# Patient Record
Sex: Female | Born: 1940
Health system: Southern US, Community
[De-identification: ages and names within clinical notes are randomized; demographics above are authoritative.]

## PROBLEM LIST (undated history)

## (undated) DIAGNOSIS — I10 Essential (primary) hypertension: Secondary | ICD-10-CM

## (undated) DIAGNOSIS — K219 Gastro-esophageal reflux disease without esophagitis: Secondary | ICD-10-CM

## (undated) DIAGNOSIS — I499 Cardiac arrhythmia, unspecified: Secondary | ICD-10-CM

## (undated) DIAGNOSIS — N3281 Overactive bladder: Secondary | ICD-10-CM

## (undated) DIAGNOSIS — R011 Cardiac murmur, unspecified: Secondary | ICD-10-CM

## (undated) DIAGNOSIS — I351 Nonrheumatic aortic (valve) insufficiency: Secondary | ICD-10-CM

## (undated) DIAGNOSIS — M1990 Unspecified osteoarthritis, unspecified site: Secondary | ICD-10-CM

## (undated) DIAGNOSIS — E785 Hyperlipidemia, unspecified: Secondary | ICD-10-CM

## (undated) DIAGNOSIS — F329 Major depressive disorder, single episode, unspecified: Secondary | ICD-10-CM

## (undated) DIAGNOSIS — G4730 Sleep apnea, unspecified: Secondary | ICD-10-CM

## (undated) DIAGNOSIS — A498 Other bacterial infections of unspecified site: Secondary | ICD-10-CM

## (undated) DIAGNOSIS — T7840XA Allergy, unspecified, initial encounter: Secondary | ICD-10-CM

## (undated) DIAGNOSIS — F32A Depression, unspecified: Secondary | ICD-10-CM

## (undated) DIAGNOSIS — N3946 Mixed incontinence: Secondary | ICD-10-CM

## (undated) DIAGNOSIS — H269 Unspecified cataract: Secondary | ICD-10-CM

## (undated) DIAGNOSIS — R112 Nausea with vomiting, unspecified: Secondary | ICD-10-CM

## (undated) DIAGNOSIS — C801 Malignant (primary) neoplasm, unspecified: Secondary | ICD-10-CM

## (undated) DIAGNOSIS — E039 Hypothyroidism, unspecified: Secondary | ICD-10-CM

## (undated) DIAGNOSIS — J302 Other seasonal allergic rhinitis: Secondary | ICD-10-CM

## (undated) DIAGNOSIS — M8580 Other specified disorders of bone density and structure, unspecified site: Secondary | ICD-10-CM

## (undated) DIAGNOSIS — E079 Disorder of thyroid, unspecified: Secondary | ICD-10-CM

## (undated) HISTORY — DX: Unspecified cataract: H26.9

## (undated) HISTORY — DX: Other seasonal allergic rhinitis: J30.2

## (undated) HISTORY — DX: Disorder of thyroid, unspecified: E07.9

## (undated) HISTORY — DX: Hyperlipidemia, unspecified: E78.5

## (undated) HISTORY — DX: Overactive bladder: N32.81

## (undated) HISTORY — DX: Gastro-esophageal reflux disease without esophagitis: K21.9

## (undated) HISTORY — DX: Malignant (primary) neoplasm, unspecified: C80.1

## (undated) HISTORY — DX: Other specified disorders of bone density and structure, unspecified site: M85.80

## (undated) HISTORY — DX: Depression, unspecified: F32.A

## (undated) HISTORY — DX: Mixed incontinence: N39.46

## (undated) HISTORY — DX: Major depressive disorder, single episode, unspecified: F32.9

## (undated) HISTORY — PX: VAGINAL HYSTERECTOMY: SUR661

## (undated) HISTORY — DX: Unspecified osteoarthritis, unspecified site: M19.90

## (undated) HISTORY — PX: CHOLECYSTECTOMY: SHX55

## (undated) HISTORY — DX: Other bacterial infections of unspecified site: A49.8

## (undated) HISTORY — DX: Cardiac arrhythmia, unspecified: I49.9

## (undated) HISTORY — DX: Essential (primary) hypertension: I10

## (undated) HISTORY — PX: TRIGGER FINGER RELEASE: SHX641

## (undated) HISTORY — DX: Nonrheumatic aortic (valve) insufficiency: I35.1

## (undated) HISTORY — PX: EYE SURGERY: SHX253

## (undated) HISTORY — PX: OTHER SURGICAL HISTORY: SHX169

## (undated) HISTORY — DX: Sleep apnea, unspecified: G47.30

## (undated) HISTORY — DX: Allergy, unspecified, initial encounter: T78.40XA

## (undated) HISTORY — DX: Cardiac murmur, unspecified: R01.1

## (undated) HISTORY — PX: COSMETIC SURGERY: SHX468

---

## 1969-04-24 HISTORY — PX: APPENDECTOMY: SHX54

## 2010-10-22 DIAGNOSIS — M5137 Other intervertebral disc degeneration, lumbosacral region: Secondary | ICD-10-CM | POA: Insufficient documentation

## 2010-10-22 DIAGNOSIS — I10 Essential (primary) hypertension: Secondary | ICD-10-CM | POA: Insufficient documentation

## 2012-05-30 ENCOUNTER — Other Ambulatory Visit: Payer: Self-pay | Admitting: Family Medicine

## 2012-05-30 DIAGNOSIS — E2839 Other primary ovarian failure: Secondary | ICD-10-CM

## 2012-06-06 ENCOUNTER — Other Ambulatory Visit: Payer: Self-pay

## 2012-06-20 ENCOUNTER — Ambulatory Visit
Admission: RE | Admit: 2012-06-20 | Discharge: 2012-06-20 | Disposition: A | Discharge status: Home / Self Care | Payer: Medicare Other | Source: Ambulatory Visit | Attending: Family Medicine | Admitting: Family Medicine

## 2012-06-20 DIAGNOSIS — E2839 Other primary ovarian failure: Secondary | ICD-10-CM

## 2012-08-05 DIAGNOSIS — M8580 Other specified disorders of bone density and structure, unspecified site: Secondary | ICD-10-CM | POA: Insufficient documentation

## 2012-08-05 HISTORY — DX: Other specified disorders of bone density and structure, unspecified site: M85.80

## 2012-08-13 DIAGNOSIS — M189 Osteoarthritis of first carpometacarpal joint, unspecified: Secondary | ICD-10-CM | POA: Insufficient documentation

## 2012-08-13 DIAGNOSIS — N3946 Mixed incontinence: Secondary | ICD-10-CM | POA: Insufficient documentation

## 2012-08-13 DIAGNOSIS — J302 Other seasonal allergic rhinitis: Secondary | ICD-10-CM | POA: Insufficient documentation

## 2012-12-13 ENCOUNTER — Other Ambulatory Visit: Payer: Self-pay | Admitting: Family Medicine

## 2012-12-13 DIAGNOSIS — Z1231 Encounter for screening mammogram for malignant neoplasm of breast: Secondary | ICD-10-CM

## 2012-12-13 DIAGNOSIS — K219 Gastro-esophageal reflux disease without esophagitis: Secondary | ICD-10-CM | POA: Insufficient documentation

## 2012-12-30 ENCOUNTER — Ambulatory Visit: Payer: Medicare Other

## 2013-01-02 ENCOUNTER — Ambulatory Visit
Admission: RE | Admit: 2013-01-02 | Discharge: 2013-01-02 | Disposition: A | Discharge status: Home / Self Care | Payer: Medicare Other | Source: Ambulatory Visit | Attending: Family Medicine | Admitting: Family Medicine

## 2013-01-02 DIAGNOSIS — Z1231 Encounter for screening mammogram for malignant neoplasm of breast: Secondary | ICD-10-CM

## 2013-12-05 ENCOUNTER — Other Ambulatory Visit: Payer: Self-pay

## 2013-12-05 DIAGNOSIS — Z1231 Encounter for screening mammogram for malignant neoplasm of breast: Secondary | ICD-10-CM

## 2013-12-16 DIAGNOSIS — N3281 Overactive bladder: Secondary | ICD-10-CM

## 2013-12-16 HISTORY — DX: Overactive bladder: N32.81

## 2014-01-06 ENCOUNTER — Ambulatory Visit
Admission: RE | Admit: 2014-01-06 | Discharge: 2014-01-06 | Disposition: A | Discharge status: Home / Self Care | Payer: Medicare Other | Source: Ambulatory Visit

## 2014-01-06 ENCOUNTER — Ambulatory Visit: Payer: Medicare Other

## 2014-01-06 DIAGNOSIS — Z1231 Encounter for screening mammogram for malignant neoplasm of breast: Secondary | ICD-10-CM

## 2014-12-15 ENCOUNTER — Other Ambulatory Visit: Payer: Self-pay

## 2014-12-15 DIAGNOSIS — Z1231 Encounter for screening mammogram for malignant neoplasm of breast: Secondary | ICD-10-CM

## 2014-12-25 ENCOUNTER — Other Ambulatory Visit: Payer: Self-pay | Admitting: Family Medicine

## 2014-12-25 DIAGNOSIS — M8580 Other specified disorders of bone density and structure, unspecified site: Secondary | ICD-10-CM

## 2015-01-22 ENCOUNTER — Ambulatory Visit: Payer: Medicare Other

## 2015-02-08 ENCOUNTER — Ambulatory Visit
Admission: RE | Admit: 2015-02-08 | Discharge: 2015-02-08 | Disposition: A | Discharge status: Home / Self Care | Payer: Medicare Other | Source: Ambulatory Visit

## 2015-02-08 DIAGNOSIS — Z1231 Encounter for screening mammogram for malignant neoplasm of breast: Secondary | ICD-10-CM

## 2015-02-12 ENCOUNTER — Ambulatory Visit
Admission: RE | Admit: 2015-02-12 | Discharge: 2015-02-12 | Disposition: A | Discharge status: Home / Self Care | Payer: Medicare Other | Source: Ambulatory Visit | Attending: Family Medicine | Admitting: Family Medicine

## 2015-02-12 DIAGNOSIS — M8580 Other specified disorders of bone density and structure, unspecified site: Secondary | ICD-10-CM

## 2015-03-22 DIAGNOSIS — Z8639 Personal history of other endocrine, nutritional and metabolic disease: Secondary | ICD-10-CM | POA: Insufficient documentation

## 2015-09-23 DIAGNOSIS — H269 Unspecified cataract: Secondary | ICD-10-CM | POA: Insufficient documentation

## 2015-09-23 HISTORY — DX: Unspecified cataract: H26.9

## 2015-12-20 DIAGNOSIS — E890 Postprocedural hypothyroidism: Secondary | ICD-10-CM | POA: Insufficient documentation

## 2015-12-21 ENCOUNTER — Other Ambulatory Visit: Payer: Self-pay | Admitting: Family Medicine

## 2015-12-21 DIAGNOSIS — Z1231 Encounter for screening mammogram for malignant neoplasm of breast: Secondary | ICD-10-CM

## 2016-02-06 DIAGNOSIS — H903 Sensorineural hearing loss, bilateral: Secondary | ICD-10-CM | POA: Insufficient documentation

## 2016-02-09 ENCOUNTER — Ambulatory Visit: Payer: Medicare Other

## 2016-02-23 ENCOUNTER — Ambulatory Visit: Payer: Medicare Other

## 2016-03-21 ENCOUNTER — Ambulatory Visit
Admission: RE | Admit: 2016-03-21 | Discharge: 2016-03-21 | Disposition: A | Discharge status: Home / Self Care | Payer: Medicare Other | Source: Ambulatory Visit | Attending: Family Medicine | Admitting: Family Medicine

## 2016-03-21 DIAGNOSIS — Z1231 Encounter for screening mammogram for malignant neoplasm of breast: Secondary | ICD-10-CM

## 2016-05-02 DIAGNOSIS — M9903 Segmental and somatic dysfunction of lumbar region: Secondary | ICD-10-CM | POA: Diagnosis not present

## 2016-05-02 DIAGNOSIS — M5032 Other cervical disc degeneration, mid-cervical region, unspecified level: Secondary | ICD-10-CM | POA: Diagnosis not present

## 2016-05-02 DIAGNOSIS — M9901 Segmental and somatic dysfunction of cervical region: Secondary | ICD-10-CM | POA: Diagnosis not present

## 2016-05-02 DIAGNOSIS — M9902 Segmental and somatic dysfunction of thoracic region: Secondary | ICD-10-CM | POA: Diagnosis not present

## 2016-05-02 DIAGNOSIS — M65849 Other synovitis and tenosynovitis, unspecified hand: Secondary | ICD-10-CM | POA: Diagnosis not present

## 2016-05-02 DIAGNOSIS — M5136 Other intervertebral disc degeneration, lumbar region: Secondary | ICD-10-CM | POA: Diagnosis not present

## 2016-05-02 DIAGNOSIS — S29012A Strain of muscle and tendon of back wall of thorax, initial encounter: Secondary | ICD-10-CM | POA: Diagnosis not present

## 2016-05-03 DIAGNOSIS — E890 Postprocedural hypothyroidism: Secondary | ICD-10-CM | POA: Diagnosis not present

## 2016-05-08 DIAGNOSIS — M79642 Pain in left hand: Secondary | ICD-10-CM | POA: Diagnosis not present

## 2016-05-08 DIAGNOSIS — M65332 Trigger finger, left middle finger: Secondary | ICD-10-CM | POA: Diagnosis not present

## 2016-05-19 DIAGNOSIS — R52 Pain, unspecified: Secondary | ICD-10-CM | POA: Diagnosis not present

## 2016-05-19 DIAGNOSIS — M94262 Chondromalacia, left knee: Secondary | ICD-10-CM | POA: Diagnosis not present

## 2016-05-19 DIAGNOSIS — I10 Essential (primary) hypertension: Secondary | ICD-10-CM | POA: Diagnosis not present

## 2016-05-30 DIAGNOSIS — S29012A Strain of muscle and tendon of back wall of thorax, initial encounter: Secondary | ICD-10-CM | POA: Diagnosis not present

## 2016-05-30 DIAGNOSIS — M9901 Segmental and somatic dysfunction of cervical region: Secondary | ICD-10-CM | POA: Diagnosis not present

## 2016-05-30 DIAGNOSIS — M9903 Segmental and somatic dysfunction of lumbar region: Secondary | ICD-10-CM | POA: Diagnosis not present

## 2016-05-30 DIAGNOSIS — M5032 Other cervical disc degeneration, mid-cervical region, unspecified level: Secondary | ICD-10-CM | POA: Diagnosis not present

## 2016-05-30 DIAGNOSIS — M5136 Other intervertebral disc degeneration, lumbar region: Secondary | ICD-10-CM | POA: Diagnosis not present

## 2016-05-30 DIAGNOSIS — M9902 Segmental and somatic dysfunction of thoracic region: Secondary | ICD-10-CM | POA: Diagnosis not present

## 2016-05-30 DIAGNOSIS — M65849 Other synovitis and tenosynovitis, unspecified hand: Secondary | ICD-10-CM | POA: Diagnosis not present

## 2016-06-28 DIAGNOSIS — M9901 Segmental and somatic dysfunction of cervical region: Secondary | ICD-10-CM | POA: Diagnosis not present

## 2016-06-28 DIAGNOSIS — M9903 Segmental and somatic dysfunction of lumbar region: Secondary | ICD-10-CM | POA: Diagnosis not present

## 2016-06-28 DIAGNOSIS — M9902 Segmental and somatic dysfunction of thoracic region: Secondary | ICD-10-CM | POA: Diagnosis not present

## 2016-06-28 DIAGNOSIS — M65849 Other synovitis and tenosynovitis, unspecified hand: Secondary | ICD-10-CM | POA: Diagnosis not present

## 2016-06-28 DIAGNOSIS — M5136 Other intervertebral disc degeneration, lumbar region: Secondary | ICD-10-CM | POA: Diagnosis not present

## 2016-06-28 DIAGNOSIS — S29012A Strain of muscle and tendon of back wall of thorax, initial encounter: Secondary | ICD-10-CM | POA: Diagnosis not present

## 2016-06-28 DIAGNOSIS — M5032 Other cervical disc degeneration, mid-cervical region, unspecified level: Secondary | ICD-10-CM | POA: Diagnosis not present

## 2016-08-02 DIAGNOSIS — M5136 Other intervertebral disc degeneration, lumbar region: Secondary | ICD-10-CM | POA: Diagnosis not present

## 2016-08-02 DIAGNOSIS — M9901 Segmental and somatic dysfunction of cervical region: Secondary | ICD-10-CM | POA: Diagnosis not present

## 2016-08-02 DIAGNOSIS — M5032 Other cervical disc degeneration, mid-cervical region, unspecified level: Secondary | ICD-10-CM | POA: Diagnosis not present

## 2016-08-02 DIAGNOSIS — M9903 Segmental and somatic dysfunction of lumbar region: Secondary | ICD-10-CM | POA: Diagnosis not present

## 2016-08-02 DIAGNOSIS — M65849 Other synovitis and tenosynovitis, unspecified hand: Secondary | ICD-10-CM | POA: Diagnosis not present

## 2016-08-02 DIAGNOSIS — M9902 Segmental and somatic dysfunction of thoracic region: Secondary | ICD-10-CM | POA: Diagnosis not present

## 2016-08-02 DIAGNOSIS — S29012A Strain of muscle and tendon of back wall of thorax, initial encounter: Secondary | ICD-10-CM | POA: Diagnosis not present

## 2016-08-15 DIAGNOSIS — E890 Postprocedural hypothyroidism: Secondary | ICD-10-CM | POA: Diagnosis not present

## 2016-08-15 DIAGNOSIS — I10 Essential (primary) hypertension: Secondary | ICD-10-CM | POA: Diagnosis not present

## 2016-08-31 DIAGNOSIS — M9902 Segmental and somatic dysfunction of thoracic region: Secondary | ICD-10-CM | POA: Diagnosis not present

## 2016-08-31 DIAGNOSIS — M9901 Segmental and somatic dysfunction of cervical region: Secondary | ICD-10-CM | POA: Diagnosis not present

## 2016-08-31 DIAGNOSIS — M5136 Other intervertebral disc degeneration, lumbar region: Secondary | ICD-10-CM | POA: Diagnosis not present

## 2016-08-31 DIAGNOSIS — M65849 Other synovitis and tenosynovitis, unspecified hand: Secondary | ICD-10-CM | POA: Diagnosis not present

## 2016-08-31 DIAGNOSIS — S29012A Strain of muscle and tendon of back wall of thorax, initial encounter: Secondary | ICD-10-CM | POA: Diagnosis not present

## 2016-08-31 DIAGNOSIS — M9903 Segmental and somatic dysfunction of lumbar region: Secondary | ICD-10-CM | POA: Diagnosis not present

## 2016-08-31 DIAGNOSIS — M5032 Other cervical disc degeneration, mid-cervical region, unspecified level: Secondary | ICD-10-CM | POA: Diagnosis not present

## 2016-10-03 DIAGNOSIS — S29012A Strain of muscle and tendon of back wall of thorax, initial encounter: Secondary | ICD-10-CM | POA: Diagnosis not present

## 2016-10-03 DIAGNOSIS — M5136 Other intervertebral disc degeneration, lumbar region: Secondary | ICD-10-CM | POA: Diagnosis not present

## 2016-10-03 DIAGNOSIS — M9902 Segmental and somatic dysfunction of thoracic region: Secondary | ICD-10-CM | POA: Diagnosis not present

## 2016-10-03 DIAGNOSIS — M9903 Segmental and somatic dysfunction of lumbar region: Secondary | ICD-10-CM | POA: Diagnosis not present

## 2016-10-03 DIAGNOSIS — M9901 Segmental and somatic dysfunction of cervical region: Secondary | ICD-10-CM | POA: Diagnosis not present

## 2016-10-03 DIAGNOSIS — M5032 Other cervical disc degeneration, mid-cervical region, unspecified level: Secondary | ICD-10-CM | POA: Diagnosis not present

## 2016-10-03 DIAGNOSIS — M65849 Other synovitis and tenosynovitis, unspecified hand: Secondary | ICD-10-CM | POA: Diagnosis not present

## 2016-10-09 DIAGNOSIS — K219 Gastro-esophageal reflux disease without esophagitis: Secondary | ICD-10-CM | POA: Diagnosis not present

## 2016-10-09 DIAGNOSIS — R1010 Upper abdominal pain, unspecified: Secondary | ICD-10-CM | POA: Diagnosis not present

## 2016-10-09 DIAGNOSIS — R0789 Other chest pain: Secondary | ICD-10-CM | POA: Diagnosis not present

## 2016-10-09 DIAGNOSIS — R05 Cough: Secondary | ICD-10-CM | POA: Diagnosis not present

## 2016-10-09 DIAGNOSIS — K5900 Constipation, unspecified: Secondary | ICD-10-CM | POA: Diagnosis not present

## 2016-10-09 DIAGNOSIS — R072 Precordial pain: Secondary | ICD-10-CM | POA: Diagnosis not present

## 2016-10-09 DIAGNOSIS — I10 Essential (primary) hypertension: Secondary | ICD-10-CM | POA: Diagnosis not present

## 2016-10-18 DIAGNOSIS — K219 Gastro-esophageal reflux disease without esophagitis: Secondary | ICD-10-CM | POA: Diagnosis not present

## 2016-10-18 DIAGNOSIS — F458 Other somatoform disorders: Secondary | ICD-10-CM | POA: Diagnosis not present

## 2016-10-18 DIAGNOSIS — I10 Essential (primary) hypertension: Secondary | ICD-10-CM | POA: Diagnosis not present

## 2016-10-24 DIAGNOSIS — Z0000 Encounter for general adult medical examination without abnormal findings: Secondary | ICD-10-CM | POA: Diagnosis not present

## 2016-10-24 DIAGNOSIS — R197 Diarrhea, unspecified: Secondary | ICD-10-CM | POA: Diagnosis not present

## 2016-11-07 DIAGNOSIS — M5032 Other cervical disc degeneration, mid-cervical region, unspecified level: Secondary | ICD-10-CM | POA: Diagnosis not present

## 2016-11-07 DIAGNOSIS — M9903 Segmental and somatic dysfunction of lumbar region: Secondary | ICD-10-CM | POA: Diagnosis not present

## 2016-11-07 DIAGNOSIS — S29012A Strain of muscle and tendon of back wall of thorax, initial encounter: Secondary | ICD-10-CM | POA: Diagnosis not present

## 2016-11-07 DIAGNOSIS — M5136 Other intervertebral disc degeneration, lumbar region: Secondary | ICD-10-CM | POA: Diagnosis not present

## 2016-11-07 DIAGNOSIS — M9902 Segmental and somatic dysfunction of thoracic region: Secondary | ICD-10-CM | POA: Diagnosis not present

## 2016-11-07 DIAGNOSIS — M65849 Other synovitis and tenosynovitis, unspecified hand: Secondary | ICD-10-CM | POA: Diagnosis not present

## 2016-11-07 DIAGNOSIS — M9901 Segmental and somatic dysfunction of cervical region: Secondary | ICD-10-CM | POA: Diagnosis not present

## 2016-11-12 ENCOUNTER — Telehealth: Payer: Self-pay | Admitting: Gastroenterology

## 2016-11-12 DIAGNOSIS — A0472 Enterocolitis due to Clostridium difficile, not specified as recurrent: Secondary | ICD-10-CM

## 2016-11-12 MED ORDER — VANCOMYCIN HCL 125 MG PO CAPS: 125.0000 mg | ORAL_CAPSULE | Freq: Four times a day (QID) | ORAL | 0 refills | Status: AC

## 2016-11-12 NOTE — Telephone Encounter (Signed)
Patient called - she states she has recently been treated for C Diff in early July. She completed a course of flagyl which she finished a week ago. She has had recurrence of loose watery diarrhea and abdominal pain, concerning for C Diff recurrence. Recommend a 10 day course of oral vancomycin. I will order this for her. I also recommended she take Florastor probiotic during this course to help prevent recurrence.  If it fails to improve her symptoms or if she has recurrence she should call back. All questions answered. She agreed with the plan.

## 2016-11-29 DIAGNOSIS — F458 Other somatoform disorders: Secondary | ICD-10-CM | POA: Diagnosis not present

## 2016-11-29 DIAGNOSIS — A0472 Enterocolitis due to Clostridium difficile, not specified as recurrent: Secondary | ICD-10-CM | POA: Diagnosis not present

## 2016-12-05 DIAGNOSIS — M5136 Other intervertebral disc degeneration, lumbar region: Secondary | ICD-10-CM | POA: Diagnosis not present

## 2016-12-05 DIAGNOSIS — M9901 Segmental and somatic dysfunction of cervical region: Secondary | ICD-10-CM | POA: Diagnosis not present

## 2016-12-05 DIAGNOSIS — M65849 Other synovitis and tenosynovitis, unspecified hand: Secondary | ICD-10-CM | POA: Diagnosis not present

## 2016-12-05 DIAGNOSIS — M9902 Segmental and somatic dysfunction of thoracic region: Secondary | ICD-10-CM | POA: Diagnosis not present

## 2016-12-05 DIAGNOSIS — S29012A Strain of muscle and tendon of back wall of thorax, initial encounter: Secondary | ICD-10-CM | POA: Diagnosis not present

## 2016-12-05 DIAGNOSIS — M5032 Other cervical disc degeneration, mid-cervical region, unspecified level: Secondary | ICD-10-CM | POA: Diagnosis not present

## 2016-12-05 DIAGNOSIS — M9903 Segmental and somatic dysfunction of lumbar region: Secondary | ICD-10-CM | POA: Diagnosis not present

## 2016-12-07 ENCOUNTER — Encounter: Payer: Self-pay | Admitting: Family Medicine

## 2016-12-07 DIAGNOSIS — R12 Heartburn: Secondary | ICD-10-CM | POA: Diagnosis not present

## 2016-12-07 DIAGNOSIS — K219 Gastro-esophageal reflux disease without esophagitis: Secondary | ICD-10-CM | POA: Diagnosis not present

## 2016-12-07 DIAGNOSIS — F458 Other somatoform disorders: Secondary | ICD-10-CM | POA: Diagnosis not present

## 2017-01-02 DIAGNOSIS — S29012A Strain of muscle and tendon of back wall of thorax, initial encounter: Secondary | ICD-10-CM | POA: Diagnosis not present

## 2017-01-02 DIAGNOSIS — M9903 Segmental and somatic dysfunction of lumbar region: Secondary | ICD-10-CM | POA: Diagnosis not present

## 2017-01-02 DIAGNOSIS — M5136 Other intervertebral disc degeneration, lumbar region: Secondary | ICD-10-CM | POA: Diagnosis not present

## 2017-01-02 DIAGNOSIS — M5032 Other cervical disc degeneration, mid-cervical region, unspecified level: Secondary | ICD-10-CM | POA: Diagnosis not present

## 2017-01-02 DIAGNOSIS — M65849 Other synovitis and tenosynovitis, unspecified hand: Secondary | ICD-10-CM | POA: Diagnosis not present

## 2017-01-02 DIAGNOSIS — M9902 Segmental and somatic dysfunction of thoracic region: Secondary | ICD-10-CM | POA: Diagnosis not present

## 2017-01-02 DIAGNOSIS — M9901 Segmental and somatic dysfunction of cervical region: Secondary | ICD-10-CM | POA: Diagnosis not present

## 2017-01-10 DIAGNOSIS — K219 Gastro-esophageal reflux disease without esophagitis: Secondary | ICD-10-CM | POA: Diagnosis not present

## 2017-01-10 DIAGNOSIS — F458 Other somatoform disorders: Secondary | ICD-10-CM | POA: Diagnosis not present

## 2017-02-01 DIAGNOSIS — M9903 Segmental and somatic dysfunction of lumbar region: Secondary | ICD-10-CM | POA: Diagnosis not present

## 2017-02-01 DIAGNOSIS — M65849 Other synovitis and tenosynovitis, unspecified hand: Secondary | ICD-10-CM | POA: Diagnosis not present

## 2017-02-01 DIAGNOSIS — M9902 Segmental and somatic dysfunction of thoracic region: Secondary | ICD-10-CM | POA: Diagnosis not present

## 2017-02-01 DIAGNOSIS — S29012A Strain of muscle and tendon of back wall of thorax, initial encounter: Secondary | ICD-10-CM | POA: Diagnosis not present

## 2017-02-01 DIAGNOSIS — M5032 Other cervical disc degeneration, mid-cervical region, unspecified level: Secondary | ICD-10-CM | POA: Diagnosis not present

## 2017-02-01 DIAGNOSIS — M5136 Other intervertebral disc degeneration, lumbar region: Secondary | ICD-10-CM | POA: Diagnosis not present

## 2017-02-01 DIAGNOSIS — M9901 Segmental and somatic dysfunction of cervical region: Secondary | ICD-10-CM | POA: Diagnosis not present

## 2017-02-02 DIAGNOSIS — Z23 Encounter for immunization: Secondary | ICD-10-CM | POA: Diagnosis not present

## 2017-02-14 DIAGNOSIS — I10 Essential (primary) hypertension: Secondary | ICD-10-CM | POA: Diagnosis not present

## 2017-02-14 DIAGNOSIS — E890 Postprocedural hypothyroidism: Secondary | ICD-10-CM | POA: Diagnosis not present

## 2017-02-19 DIAGNOSIS — H35363 Drusen (degenerative) of macula, bilateral: Secondary | ICD-10-CM | POA: Diagnosis not present

## 2017-02-19 DIAGNOSIS — H04123 Dry eye syndrome of bilateral lacrimal glands: Secondary | ICD-10-CM | POA: Diagnosis not present

## 2017-02-28 DIAGNOSIS — M9902 Segmental and somatic dysfunction of thoracic region: Secondary | ICD-10-CM | POA: Diagnosis not present

## 2017-02-28 DIAGNOSIS — M9903 Segmental and somatic dysfunction of lumbar region: Secondary | ICD-10-CM | POA: Diagnosis not present

## 2017-02-28 DIAGNOSIS — M5136 Other intervertebral disc degeneration, lumbar region: Secondary | ICD-10-CM | POA: Diagnosis not present

## 2017-02-28 DIAGNOSIS — M5032 Other cervical disc degeneration, mid-cervical region, unspecified level: Secondary | ICD-10-CM | POA: Diagnosis not present

## 2017-02-28 DIAGNOSIS — M65849 Other synovitis and tenosynovitis, unspecified hand: Secondary | ICD-10-CM | POA: Diagnosis not present

## 2017-02-28 DIAGNOSIS — S29012A Strain of muscle and tendon of back wall of thorax, initial encounter: Secondary | ICD-10-CM | POA: Diagnosis not present

## 2017-02-28 DIAGNOSIS — M9901 Segmental and somatic dysfunction of cervical region: Secondary | ICD-10-CM | POA: Diagnosis not present

## 2017-03-22 ENCOUNTER — Other Ambulatory Visit: Payer: Self-pay | Admitting: Family Medicine

## 2017-03-22 DIAGNOSIS — Z1231 Encounter for screening mammogram for malignant neoplasm of breast: Secondary | ICD-10-CM

## 2017-03-23 ENCOUNTER — Ambulatory Visit
Admission: RE | Admit: 2017-03-23 | Discharge: 2017-03-23 | Disposition: A | Discharge status: Home / Self Care | Payer: Medicare Other | Source: Ambulatory Visit | Attending: Family Medicine | Admitting: Family Medicine

## 2017-03-23 DIAGNOSIS — Z1231 Encounter for screening mammogram for malignant neoplasm of breast: Secondary | ICD-10-CM | POA: Diagnosis not present

## 2017-03-23 IMAGING — MG 2D DIGITAL SCREENING BILATERAL MAMMOGRAM WITH CAD AND ADJUNCT TO
8 of 12 series · 8 of 28 positions shown · non-contrast
Comparison: Previous exam(s).

CLINICAL DATA: Screening.

EXAM:
2D DIGITAL SCREENING BILATERAL MAMMOGRAM WITH CAD AND ADJUNCT TOMO

[L CC synth-2D]
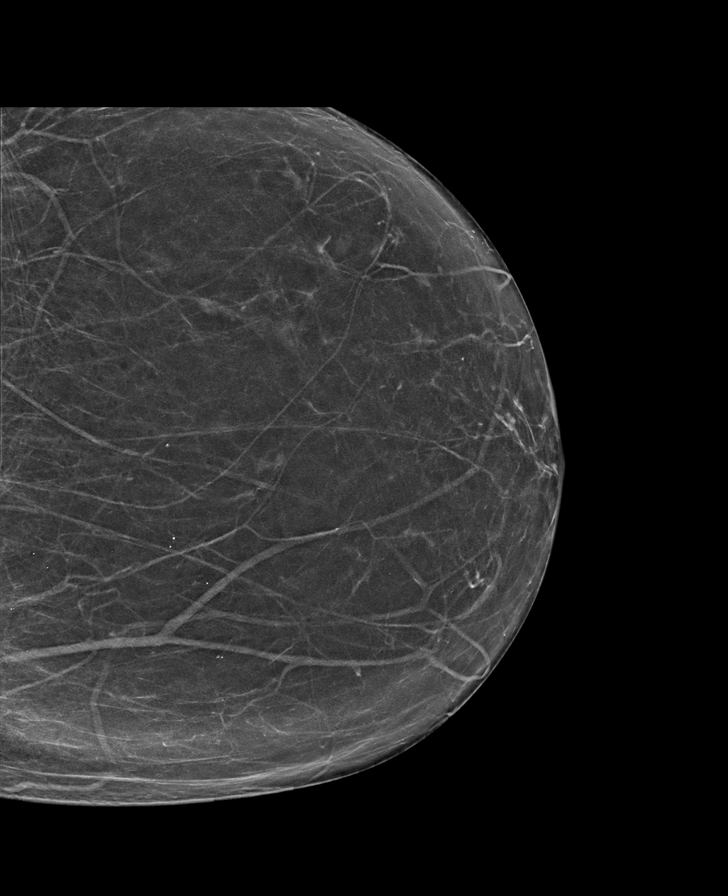

[R CC synth-2D]
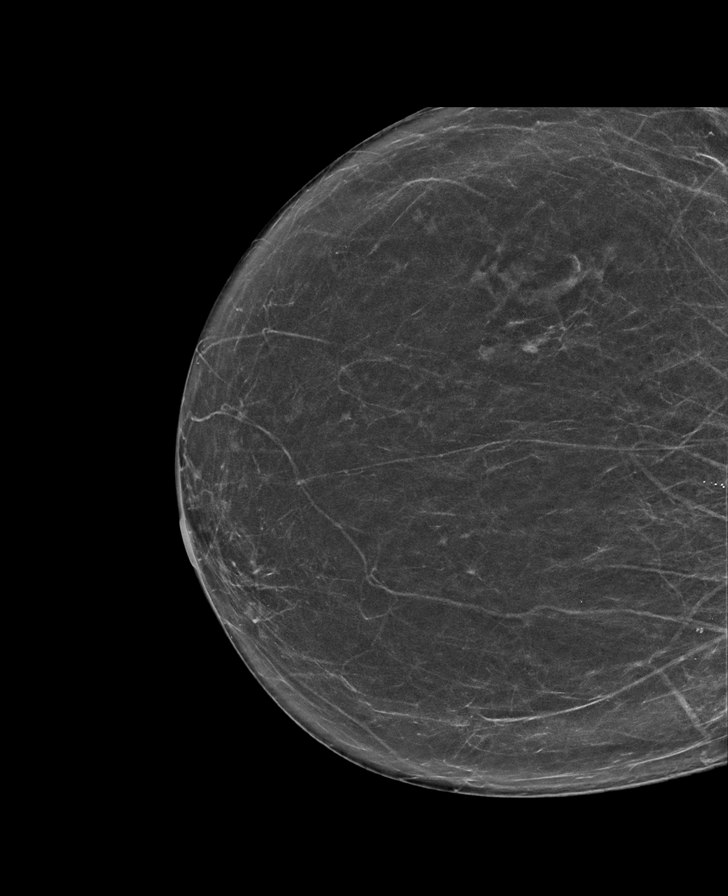

[R CC]
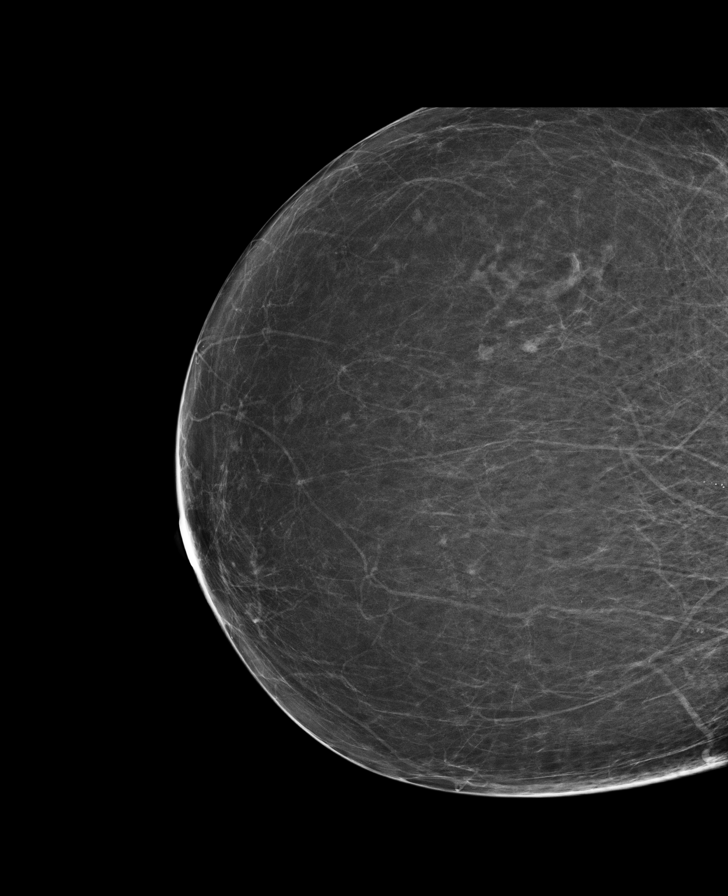

[R MLO]
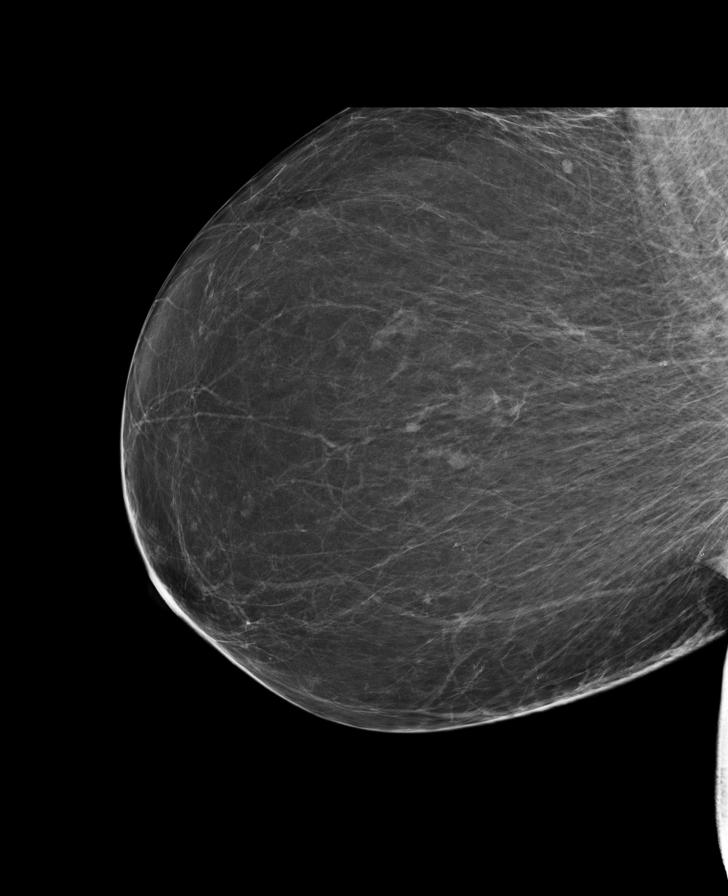

[L MLO synth-2D]
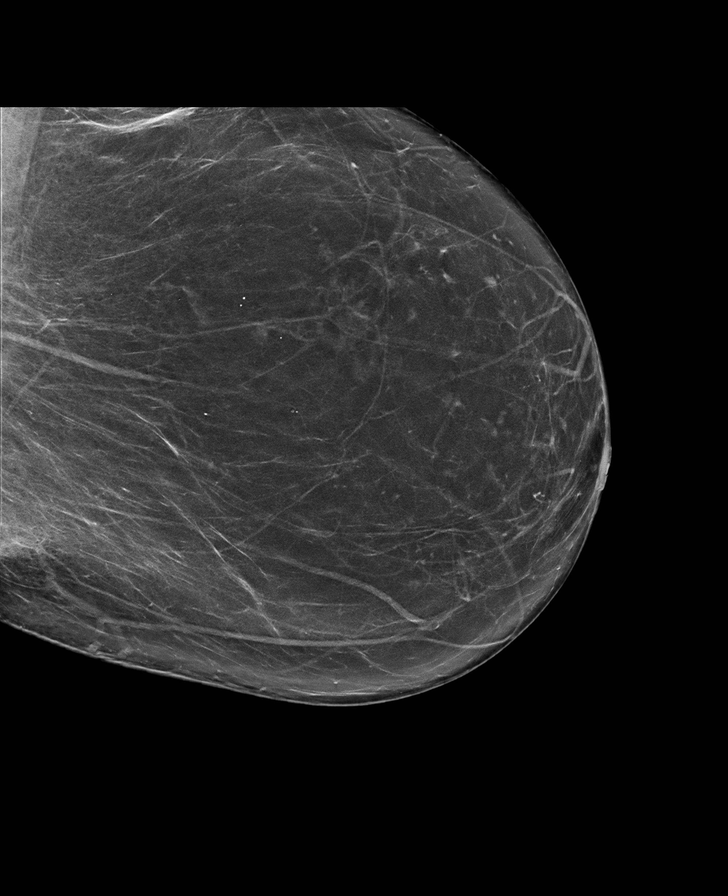

[L CC]
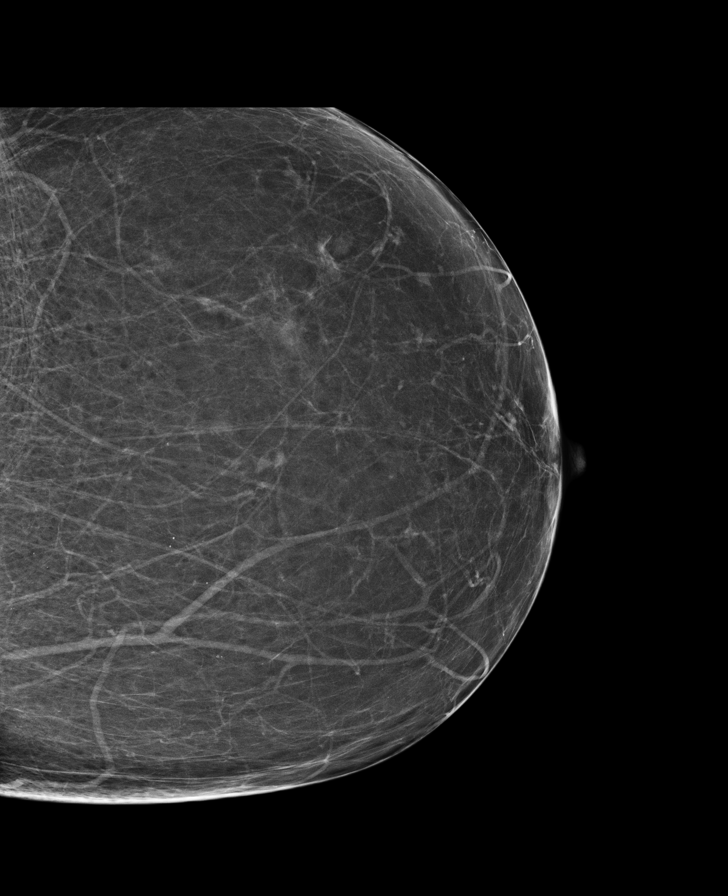

[L MLO]
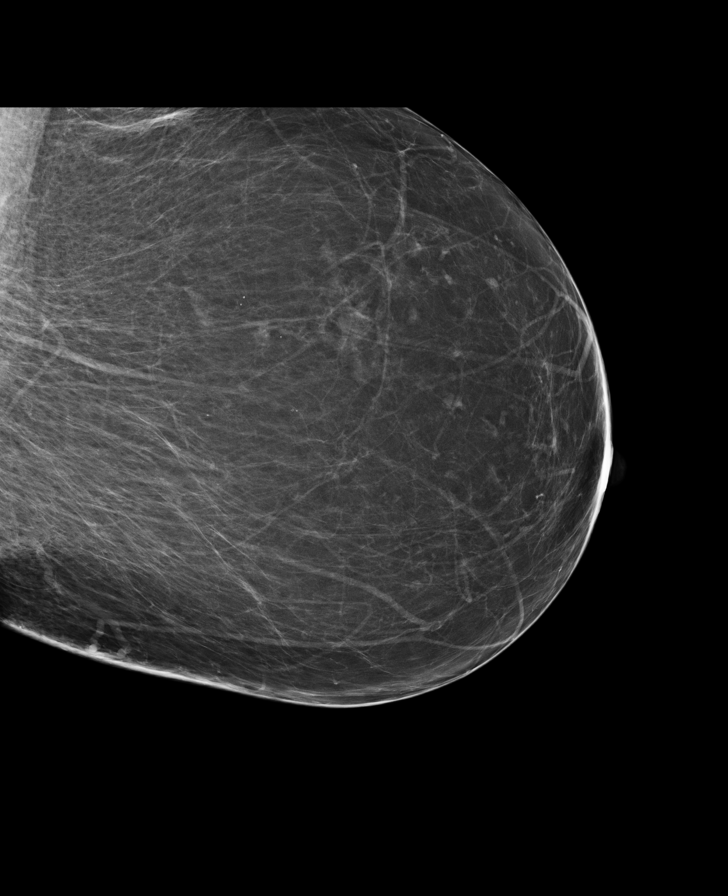

[R MLO synth-2D]
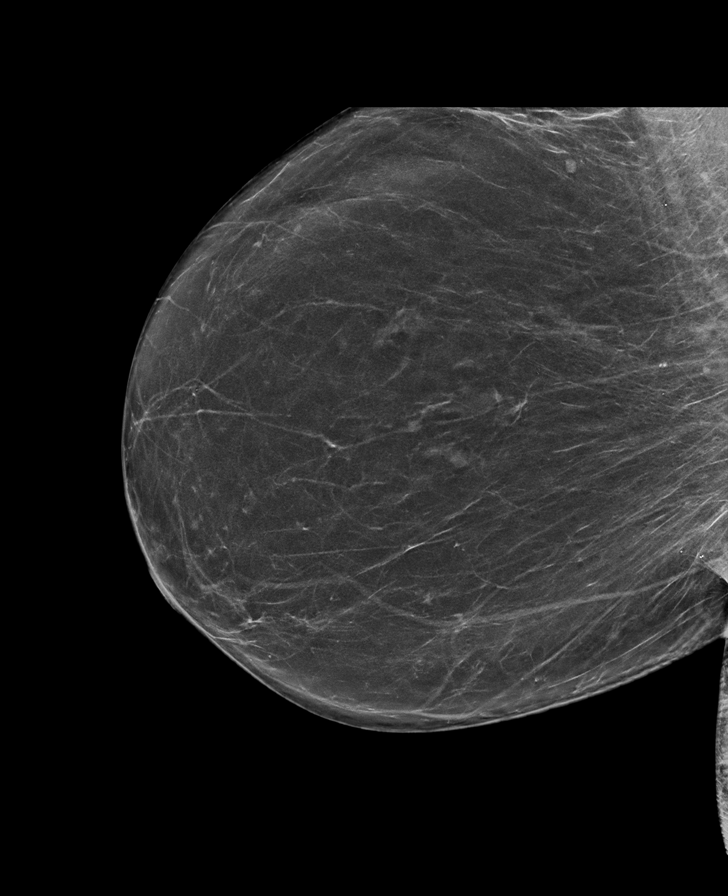

[8 of 28 positions shown; findings below may reference images not displayed]

ACR Breast Density Category b: There are scattered areas of
fibroglandular density.
FINDINGS: There are no findings suspicious for malignancy. Images were
processed with CAD.
IMPRESSION: No mammographic evidence of malignancy. A result letter of this
screening mammogram will be mailed directly to the patient.

RECOMMENDATION:
Screening mammogram in one year. (Code:[33])

BI-RADS CATEGORY  1: Negative.

## 2017-03-29 DIAGNOSIS — M5032 Other cervical disc degeneration, mid-cervical region, unspecified level: Secondary | ICD-10-CM | POA: Diagnosis not present

## 2017-03-29 DIAGNOSIS — M9901 Segmental and somatic dysfunction of cervical region: Secondary | ICD-10-CM | POA: Diagnosis not present

## 2017-03-29 DIAGNOSIS — M9903 Segmental and somatic dysfunction of lumbar region: Secondary | ICD-10-CM | POA: Diagnosis not present

## 2017-03-29 DIAGNOSIS — M5136 Other intervertebral disc degeneration, lumbar region: Secondary | ICD-10-CM | POA: Diagnosis not present

## 2017-03-29 DIAGNOSIS — M65849 Other synovitis and tenosynovitis, unspecified hand: Secondary | ICD-10-CM | POA: Diagnosis not present

## 2017-03-29 DIAGNOSIS — M9902 Segmental and somatic dysfunction of thoracic region: Secondary | ICD-10-CM | POA: Diagnosis not present

## 2017-03-29 DIAGNOSIS — S29012A Strain of muscle and tendon of back wall of thorax, initial encounter: Secondary | ICD-10-CM | POA: Diagnosis not present

## 2017-04-03 ENCOUNTER — Ambulatory Visit: Payer: Medicare Other | Admitting: Family Medicine

## 2017-04-06 ENCOUNTER — Ambulatory Visit: Payer: Medicare Other | Admitting: Family Medicine

## 2017-04-06 DIAGNOSIS — H26493 Other secondary cataract, bilateral: Secondary | ICD-10-CM | POA: Diagnosis not present

## 2017-04-06 DIAGNOSIS — Z961 Presence of intraocular lens: Secondary | ICD-10-CM | POA: Diagnosis not present

## 2017-04-06 DIAGNOSIS — H26492 Other secondary cataract, left eye: Secondary | ICD-10-CM | POA: Diagnosis not present

## 2017-04-10 ENCOUNTER — Other Ambulatory Visit: Payer: Self-pay | Admitting: *Deleted

## 2017-04-10 ENCOUNTER — Encounter: Payer: Self-pay | Admitting: *Deleted

## 2017-04-16 ENCOUNTER — Ambulatory Visit (INDEPENDENT_AMBULATORY_CARE_PROVIDER_SITE_OTHER): Payer: Medicare Other | Admitting: Family Medicine

## 2017-04-16 ENCOUNTER — Ambulatory Visit: Payer: Medicare Other | Admitting: Family Medicine

## 2017-04-16 ENCOUNTER — Encounter: Payer: Self-pay | Admitting: Family Medicine

## 2017-04-16 VITALS — BP 140/80 | HR 59 | Temp 97.8°F | Ht 61.0 in | Wt 174.4 lb

## 2017-04-16 DIAGNOSIS — K219 Gastro-esophageal reflux disease without esophagitis: Secondary | ICD-10-CM | POA: Diagnosis not present

## 2017-04-16 DIAGNOSIS — R9431 Abnormal electrocardiogram [ECG] [EKG]: Secondary | ICD-10-CM

## 2017-04-16 DIAGNOSIS — I10 Essential (primary) hypertension: Secondary | ICD-10-CM | POA: Diagnosis not present

## 2017-04-16 DIAGNOSIS — R5383 Other fatigue: Secondary | ICD-10-CM

## 2017-04-16 DIAGNOSIS — E782 Mixed hyperlipidemia: Secondary | ICD-10-CM

## 2017-04-16 DIAGNOSIS — F329 Major depressive disorder, single episode, unspecified: Secondary | ICD-10-CM | POA: Insufficient documentation

## 2017-04-16 DIAGNOSIS — E890 Postprocedural hypothyroidism: Secondary | ICD-10-CM | POA: Diagnosis not present

## 2017-04-16 DIAGNOSIS — A498 Other bacterial infections of unspecified site: Secondary | ICD-10-CM

## 2017-04-16 DIAGNOSIS — F341 Dysthymic disorder: Secondary | ICD-10-CM | POA: Diagnosis not present

## 2017-04-16 DIAGNOSIS — F339 Major depressive disorder, recurrent, unspecified: Secondary | ICD-10-CM | POA: Insufficient documentation

## 2017-04-16 DIAGNOSIS — Z8249 Family history of ischemic heart disease and other diseases of the circulatory system: Secondary | ICD-10-CM | POA: Insufficient documentation

## 2017-04-16 HISTORY — DX: Other bacterial infections of unspecified site: A49.8

## 2017-04-16 LAB — COMPREHENSIVE METABOLIC PANEL
ALBUMIN: 4.2 g/dL (ref 3.5–5.2)
ALK PHOS: 93 U/L (ref 39–117)
ALT: 10 U/L (ref 0–35)
AST: 15 U/L (ref 0–37)
BUN: 22 mg/dL (ref 6–23)
CALCIUM: 9.5 mg/dL (ref 8.4–10.5)
CO2: 30 mEq/L (ref 19–32)
Chloride: 102 mEq/L (ref 96–112)
Creatinine, Ser: 0.96 mg/dL (ref 0.40–1.20)
GFR: 60.09 mL/min (ref >60.00)
Glucose, Bld: 79 mg/dL (ref 70–99)
POTASSIUM: 4.6 meq/L (ref 3.5–5.1)
Sodium: 139 mEq/L (ref 135–145)
TOTAL PROTEIN: 6.9 g/dL (ref 6.0–8.3)
Total Bilirubin: 0.6 mg/dL (ref 0.2–1.2)

## 2017-04-16 LAB — LIPID PANEL
CHOLESTEROL: 201 mg/dL — AB (ref 0–200)
HDL: 70.5 mg/dL (ref >39.00)
LDL Cholesterol: 116 mg/dL — ABNORMAL HIGH (ref 0–99)
NonHDL: 130.83
Total CHOL/HDL Ratio: 3
Triglycerides: 75 mg/dL (ref 0.0–149.0)
VLDL: 15 mg/dL (ref 0.0–40.0)

## 2017-04-16 LAB — POCT URINALYSIS DIPSTICK
Bilirubin, UA: NEGATIVE
GLUCOSE UA: NEGATIVE
Ketones, UA: NEGATIVE
LEUKOCYTES UA: NEGATIVE
Nitrite, UA: NEGATIVE
PROTEIN UA: NEGATIVE
RBC UA: NEGATIVE
Spec Grav, UA: 1.02 (ref 1.010–1.025)
Urobilinogen, UA: 0.2 E.U./dL
pH, UA: 6 (ref 5.0–8.0)

## 2017-04-16 LAB — CBC WITH DIFFERENTIAL/PLATELET
Basophils Absolute: 0.1 10*3/uL (ref 0.0–0.1)
Basophils Relative: 1.1 % (ref 0.0–3.0)
EOS PCT: 2.4 % (ref 0.0–5.0)
Eosinophils Absolute: 0.2 10*3/uL (ref 0.0–0.7)
HEMATOCRIT: 41.2 % (ref 36.0–46.0)
HEMOGLOBIN: 13.4 g/dL (ref 12.0–15.0)
LYMPHS ABS: 1.7 10*3/uL (ref 0.7–4.0)
Lymphocytes Relative: 25 % (ref 12.0–46.0)
MCHC: 32.5 g/dL (ref 30.0–36.0)
MCV: 88.5 fl (ref 78.0–100.0)
MONOS PCT: 7.8 % (ref 3.0–12.0)
Monocytes Absolute: 0.5 10*3/uL (ref 0.1–1.0)
Neutro Abs: 4.2 10*3/uL (ref 1.4–7.7)
Neutrophils Relative %: 63.7 % (ref 43.0–77.0)
Platelets: 259 10*3/uL (ref 150.0–400.0)
RBC: 4.66 Mil/uL (ref 3.87–5.11)
RDW: 14.1 % (ref 11.5–15.5)
WBC: 6.7 10*3/uL (ref 4.0–10.5)

## 2017-04-16 MED ORDER — SOLIFENACIN SUCCINATE 10 MG PO TABS: 10.0000 mg | ORAL_TABLET | Freq: Every day | ORAL | 3 refills | Status: DC

## 2017-04-16 NOTE — Progress Notes (Signed)
Subjective  Chief Complaint  Patient presents with  . Establish Care    Transfer from [place]  . Annual Exam    Not fasting    HPI: Martha Gilmore is a 76 y.o. female who presents to [facility] today for a Female Wellness Visit. She also has the concerns and/or needs as listed above in the chief complaint. These will be addressed in addition to the Health Maintenance Visit/medicare. She is a former [facility] patient and is here to reestablish care with me today.   Wellness Visit: annual visit with health maintenance review and exam without Pap   Up to date on screens. Due for labs. Weight is stable.  Lifestyle: Body mass index is 32.95 kg/m. Wt Readings from Last 3 Encounters:  04/16/17 174 lb 6.1 oz (79.1 kg)   Diet: general Exercise: never, none  Chronic disease management visit and/or acute problem visit:  We reviewed her multiple medical problems. Currently with active mood problems mainly related to caregiver stress - husband has ESRD on dialysis and is becoming very frail. On celexa for years. Believes it helps but not sure.    Fatigue and abnl EKG: She admits her fatigue has improved since June ( I reviewed that note) ; she was eventually diagnosed with GERD and C.diff and treated. Doing much better but feels tired with activity w/o chest pain; she is sedentary. Reviewed EKG which showed new q waves in V1-V3 w/o other changes. She has a strong FH of premature CAD.  She doesn't check her BP; today it is borderline with mild brady. No lightheadedness  Hypothyroidism f/u: recent visit by Dr. Steffanie Dunn. Improved. Stable on meds.   GERD - doing ok on omeprazole. dexilant worked better but was too expensive. Had EGD by Dr. Benson Norway. I am requesting those records.   OAB - vesicare not holding her urge sxs. Has stress incont as well. Never has seen urology. No dysuria or irritative sxs. Has to wear a pad.   Lipids: due for recheck on statin. W/o AEs.    Patient Active Problem List   Diagnosis Date Noted  . Postablative hypothyroidism 12/20/2015    Priority: High  . Major depression, chronic 04/16/2017    Priority: Medium  . Mixed hyperlipidemia 04/16/2017    Priority: Medium  . History of Graves' disease 03/22/2015    Priority: Medium  . GERD (gastroesophageal reflux disease) 12/13/2012    Priority: Medium  . Osteopenia 08/05/2012    Priority: Medium  . DDD (degenerative disc disease), lumbosacral 10/22/2010    Priority: Medium  . Bilateral sensorineural hearing loss 02/06/2016    Priority: Low  . OAB (overactive bladder) 12/16/2013    Priority: Low  . Osteoarthritis of [place] joint of thumb 08/13/2012    Priority: Low  . Seasonal allergies 08/13/2012    Priority: Low  . Urge and stress incontinence 08/13/2012    Priority: Low  . Clostridium difficile infection 04/16/2017  . Family history of early CAD 04/16/2017   Health Maintenance  Topic Date Due  . DEXA SCAN  02/11/2018  . MAMMOGRAM  03/23/2018  . TETANUS/TDAP  08/16/2020  . COLONOSCOPY  04/04/2025  . INFLUENZA VACCINE  Completed  . PNA vac Low Risk Adult  Completed   Immunization History  Administered Date(s) Administered  . Influenza Split 01/15/2012  . Influenza, High Dose Seasonal PF 01/30/2013, 01/05/2015, 02/17/2016, 02/02/2017  . Influenza, Seasonal, Injecte, Preservative Fre 01/21/2014  . Influenza-Unspecified 01/09/2011, 01/15/2012, 01/21/2014  . Pneumococcal Conjugate-13  01/05/2015  . Pneumococcal Polysaccharide-23 04/24/2006, 11/18/2007, 05/30/2012  . Tdap 08/17/2010  . Zoster 04/25/1995   We updated and reviewed the patient's past history in detail and it is documented below. Allergies: Patient is allergic to aspirin; meperidine; penicillins; statins; cephalexin; clindamycin/lincomycin; other; and tape. Past Medical History Patient  has a past medical history of Cataracts, bilateral (09/23/2015), Clostridium difficile infection (04/16/2017),  Depression, Hyperlipidemia, Hypertension, OAB (overactive bladder) (12/16/2013), Osteopenia (08/05/2012), Seasonal allergies, and Urge and stress incontinence. Past Surgical History Patient  has a past surgical history that includes Cholecystectomy; Cesarean section; Teeth Implants; Appendectomy (1971); Trigger finger release; and Vaginal hysterectomy. Family History: Patient family history includes Brain cancer in her brother; Cancer in her father; Diabetes in her sister; Heart disease in her father, maternal grandmother, mother, and sister; Kidney cancer in her sister; Lung cancer in her sister. Social History:  Patient  reports that  has never smoked. she has never used smokeless tobacco. She reports that she does not drink alcohol or use drugs.  Review of Systems: Constitutional: negative for fever or malaise Ophthalmic: negative for photophobia, double vision or loss of vision Cardiovascular: negative for chest pain, dyspnea on exertion, or new LE swelling Respiratory: negative for SOB or persistent cough Gastrointestinal: negative for abdominal pain, change in bowel habits or melena Genitourinary: negative for dysuria or gross hematuria, no abnormal uterine bleeding or disharge Musculoskeletal: negative for new gait disturbance or muscular weakness Integumentary: negative for new or persistent rashes, no breast lumps Neurological: negative for TIA or stroke symptoms Psychiatric: negative for SI or delusions Allergic/Immunologic: negative for hives  Patient Care Team    Relationship Specialty Notifications Start End  Leamon Arnt, MD PCP - General Family Medicine  03/21/16   Susette Racer, MD Consulting Physician Endocrinology  04/16/17   Carol Ada, MD Consulting Physician Gastroenterology  04/16/17     Objective  Vitals: BP 140/80 (BP Location: Left Arm, Patient Position: Sitting, Cuff Size: Large)   Pulse (!) 59   Temp 97.8 F (36.6 C) (Oral)   Ht 5\' 1"  (1.549 m)   Wt  174 lb 6.1 oz (79.1 kg)   SpO2 97%   BMI 32.95 kg/m  General:  Well developed, well nourished, no acute distress  Psych:  Alert and orientedx3,normal mood and affect HEENT:  Normocephalic, atraumatic, non-icteric sclera, PERRL, oropharynx is clear without mass or exudate, supple neck without adenopathy, mass or thyromegaly Cardiovascular:  Normal S1, S2, RRR without gallop, rub or murmur, nondisplaced PMI Respiratory:  Good breath sounds bilaterally, CTAB with normal respiratory effort Gastrointestinal: normal bowel sounds, soft, non-tender, no noted masses. No HSM MSK: no deformities, contusions. Joints are without erythema or swelling. Spine and CVA region are nontender Skin:  Warm, no rashes or suspicious lesions noted Neurologic:    Mental status is normal. CN 2-11 are normal. Gross motor and sensory exams are normal. Normal gait. No tremor Breast Exam: large pendulous, No mass, skin retraction or nipple discharge is appreciated in either breast. No axillary adenopathy. Fibrocystic changes are not noted   Assessment  1. Fatigue, unspecified type   2. Major depression, chronic   3. Mixed hyperlipidemia   4. Postablative hypothyroidism   5. Gastroesophageal reflux disease without esophagitis   6. Abnormal EKG   7. Family history of early CAD      Plan  Female Wellness Visit/medicare:  Age appropriate Health Maintenance and Prevention measures were discussed with patient. Included topics are cancer screening recommendations, ways to keep healthy (see  AVS) including dietary and exercise recommendations, regular eye and dental care, use of seat belts, and avoidance of moderate alcohol use and tobacco use. All up to date  BMI: discussed patient's BMI and encouraged positive lifestyle modifications to help get to or maintain a target BMI.  HM needs and immunizations were addressed and ordered. See below for orders. See HM and immunization section for updates. Up to date  Routine labs  and screening tests ordered including cmp, cbc and lipids where appropriate.  Discussed recommendations regarding Vit D and calcium supplementation (see AVS)  Chronic disease f/u and/or acute problem visit: (deemed necessary to be done in addition to the wellness visit):  abnl EKG with fatigue: rec treadmill stress test for further evaluation. Referral placed. Educated patient.   Lipids: recheck labs and continue meds.   GERD - continue PPI and get GI records.   C.diff - resolved. Get records.   Depression - refer to therapist. rcommend counseling.   OAB - increase vesicare dosing to see if helps. Consider urology eval if doesn't.   Follow up: Return in about 3 months (around 07/15/2017) for and AWV at patients convenience, follow up on. or as needed for any worsening symptoms or changes in condition.   Commons side effects, risks, benefits, and alternatives for medications and treatment plan prescribed today were discussed, and the patient expressed understanding of the given instructions. Patient is instructed to call or message via MyChart if he/she has any questions or concerns regarding our treatment plan. No barriers to understanding were identified. We discussed Red Flag symptoms and signs in detail. Patient expressed understanding regarding what to do in case of urgent or emergency type symptoms.   Medication list was reconciled, printed and provided to the patient in AVS. Patient instructions and summary information was reviewed with the patient as documented in the AVS. This note was prepared with assistance of Dragon voice recognition software. Occasional wrong-word or sound-a-like substitutions may have occurred due to the inherent limitations of voice recognition software  Orders Placed This Encounter  Procedures  . CBC with Differential/Platelet  . Comprehensive metabolic panel  . Lipid panel  . Exercise Tolerance Test  . POCT urinalysis dipstick   Meds ordered this  encounter  Medications  . solifenacin (VESICARE) 10 MG tablet    Sig: Take 1 tablet (10 mg total) by mouth daily.    Dispense:  90 tablet    Refill:  3

## 2017-04-16 NOTE — Patient Instructions (Addendum)
It was so good seeing you again! Thank you for establishing with my new practice and allowing me to continue caring for you. It means a lot to me.   Please schedule a follow up appointment with me in 3 months for follow up on blood pressure and mood. Please sign a release of records for Dr. Carol Ada.  Please contact a therapist to help deal with some of your stressors.   We will call you with information regarding your referral appointment. Stress test, treadmill to assess your heart.   Medicare recommends an Annual Wellness Visit for all patients. Please schedule this to be done with our Nurse Educator, Maudie Mercury. This is an informative "talk" visit; it's goals are to ensure that your health care needs are being met and to give you education regarding avoiding falls, ensuring you are not suffering from depression or problems with memory or thinking, and to educate you on Advance Care Planning. It helps me take good care of you!

## 2017-04-18 ENCOUNTER — Encounter: Payer: Self-pay | Admitting: Family Medicine

## 2017-04-18 NOTE — Progress Notes (Signed)
Lab results mailed to patient in letter. Normal results. No action / follow up needed on these results.

## 2017-04-18 NOTE — Progress Notes (Signed)
I have reviewed results. Normal. Patient notified by letter. Please see letter for details. 

## 2017-04-26 ENCOUNTER — Ambulatory Visit (INDEPENDENT_AMBULATORY_CARE_PROVIDER_SITE_OTHER): Payer: Medicare Other

## 2017-04-26 DIAGNOSIS — R5383 Other fatigue: Secondary | ICD-10-CM

## 2017-04-26 DIAGNOSIS — R9431 Abnormal electrocardiogram [ECG] [EKG]: Secondary | ICD-10-CM

## 2017-04-26 DIAGNOSIS — M9903 Segmental and somatic dysfunction of lumbar region: Secondary | ICD-10-CM | POA: Diagnosis not present

## 2017-04-26 DIAGNOSIS — M5136 Other intervertebral disc degeneration, lumbar region: Secondary | ICD-10-CM | POA: Diagnosis not present

## 2017-04-26 DIAGNOSIS — Z8249 Family history of ischemic heart disease and other diseases of the circulatory system: Secondary | ICD-10-CM

## 2017-04-26 DIAGNOSIS — M5032 Other cervical disc degeneration, mid-cervical region, unspecified level: Secondary | ICD-10-CM | POA: Diagnosis not present

## 2017-04-26 DIAGNOSIS — M9901 Segmental and somatic dysfunction of cervical region: Secondary | ICD-10-CM | POA: Diagnosis not present

## 2017-04-26 DIAGNOSIS — M9902 Segmental and somatic dysfunction of thoracic region: Secondary | ICD-10-CM | POA: Diagnosis not present

## 2017-04-26 DIAGNOSIS — M65849 Other synovitis and tenosynovitis, unspecified hand: Secondary | ICD-10-CM | POA: Diagnosis not present

## 2017-04-26 DIAGNOSIS — S29012A Strain of muscle and tendon of back wall of thorax, initial encounter: Secondary | ICD-10-CM | POA: Diagnosis not present

## 2017-04-26 LAB — EXERCISE TOLERANCE TEST
CSEPED: 1 min
CSEPEDS: 1 s
CSEPEW: 2.8 METS
CSEPHR: 77 %
MPHR: 145 {beats}/min
Peak HR: 113 {beats}/min
RPE: 14
Rest HR: 60 {beats}/min

## 2017-04-27 DIAGNOSIS — D225 Melanocytic nevi of trunk: Secondary | ICD-10-CM | POA: Diagnosis not present

## 2017-04-27 DIAGNOSIS — D2272 Melanocytic nevi of left lower limb, including hip: Secondary | ICD-10-CM | POA: Diagnosis not present

## 2017-04-27 DIAGNOSIS — L814 Other melanin hyperpigmentation: Secondary | ICD-10-CM | POA: Diagnosis not present

## 2017-04-27 DIAGNOSIS — L821 Other seborrheic keratosis: Secondary | ICD-10-CM | POA: Diagnosis not present

## 2017-04-27 DIAGNOSIS — D2271 Melanocytic nevi of right lower limb, including hip: Secondary | ICD-10-CM | POA: Diagnosis not present

## 2017-04-27 DIAGNOSIS — Z85828 Personal history of other malignant neoplasm of skin: Secondary | ICD-10-CM | POA: Diagnosis not present

## 2017-04-27 DIAGNOSIS — D2262 Melanocytic nevi of left upper limb, including shoulder: Secondary | ICD-10-CM | POA: Diagnosis not present

## 2017-04-27 DIAGNOSIS — D2261 Melanocytic nevi of right upper limb, including shoulder: Secondary | ICD-10-CM | POA: Diagnosis not present

## 2017-04-27 DIAGNOSIS — L820 Inflamed seborrheic keratosis: Secondary | ICD-10-CM | POA: Diagnosis not present

## 2017-04-27 DIAGNOSIS — D1801 Hemangioma of skin and subcutaneous tissue: Secondary | ICD-10-CM | POA: Diagnosis not present

## 2017-05-03 ENCOUNTER — Other Ambulatory Visit: Payer: Self-pay | Admitting: *Deleted

## 2017-05-03 ENCOUNTER — Encounter: Payer: Self-pay | Admitting: Family Medicine

## 2017-05-03 MED ORDER — SOLIFENACIN SUCCINATE 10 MG PO TABS: 10.0000 mg | ORAL_TABLET | Freq: Every day | ORAL | 3 refills | Status: DC

## 2017-05-03 MED ORDER — ATORVASTATIN CALCIUM 10 MG PO TABS: 10.0000 mg | ORAL_TABLET | Freq: Every day | ORAL | 3 refills | Status: DC

## 2017-05-03 MED ORDER — OMEPRAZOLE 20 MG PO CPDR: 20.0000 mg | DELAYED_RELEASE_CAPSULE | Freq: Every day | ORAL | 3 refills | Status: DC

## 2017-05-08 ENCOUNTER — Telehealth: Payer: Self-pay | Admitting: Family Medicine

## 2017-05-08 MED ORDER — CITALOPRAM HYDROBROMIDE 40 MG PO TABS: 20.0000 mg | ORAL_TABLET | Freq: Every day | ORAL | Status: DC

## 2017-05-08 NOTE — Telephone Encounter (Signed)
Please call patient and ask her to decrease her Celexa to 20g daily - this is needed due to an unwanted interaction with omeprazole.   Please order new tabs (20mg ) if patient is unable to cut her pills in half.   And ask how her husband is doing: should be released from hospital soon?  thanks

## 2017-05-08 NOTE — Telephone Encounter (Signed)
Left message on voicemail to call office.  

## 2017-05-09 ENCOUNTER — Telehealth: Payer: Self-pay | Admitting: Family Medicine

## 2017-05-09 NOTE — Telephone Encounter (Signed)
Copied from [place] [id number]. Topic: Quick Communication - See Telephone Encounter >> May 09, 2017 10:16 AM Burnis Medin, NT wrote: CRM for notification. See Telephone encounter for: Altha Harm is calling to see if the doctor still wanted patient to take the citalopram (CELEXA) 40 MG tablet and omeprazole (PRILOSEC) 20 MG capsule because of FDA guidelines and qt prolongation. She would like a call back at [phone] Reference number is [id number]  05/09/17.

## 2017-05-09 NOTE — Telephone Encounter (Signed)
Patient called in for request of Omeprazole and Celexa, see pharmacy note.

## 2017-05-09 NOTE — Telephone Encounter (Signed)
Left message on voicemail to call office.  

## 2017-05-09 NOTE — Telephone Encounter (Signed)
Copied from [place] [id number]. Topic: Quick Communication - Office Called Patient >> May 09, 2017  3:41 PM Patrice Paradise wrote: Reason for CRM: Patient returned the office call. She stated that the doctor still have her on these two meds citalopram (CELEXA) 40 MG tablet and omeprazole (PRILOSEC) 20 MG capsule. And, would like for the prescriptions to go to   [facility] [address] [place] [address] Phone: [phone] Fax: [phone]

## 2017-05-10 MED ORDER — OMEPRAZOLE 20 MG PO CPDR: 20.0000 mg | DELAYED_RELEASE_CAPSULE | Freq: Every day | ORAL | 3 refills | Status: DC

## 2017-05-10 MED ORDER — CITALOPRAM HYDROBROMIDE 20 MG PO TABS: 20.0000 mg | ORAL_TABLET | Freq: Every day | ORAL | 3 refills | Status: DC

## 2017-05-10 NOTE — Telephone Encounter (Signed)
Spoke to pt, told her per Dr. [name] need to decrease Celexa to 20 mg daily due to unwanted interaction with omeprazole. Pt verbalized understanding. Told pt I will send new Rx to pharmacy for new dose. Pt verbalized understanding.

## 2017-05-10 NOTE — Telephone Encounter (Signed)
Spoke to pt, will send Rx's to [facility] as requested. Pt verbalized understanding.Rx for Celexa decreased to 20 mg daily. Pt aware see other message. Rx's sent.

## 2017-05-13 NOTE — Progress Notes (Signed)
Reviewed report/results; normal/stable.

## 2017-06-15 ENCOUNTER — Encounter: Payer: Self-pay | Admitting: Family Medicine

## 2017-06-15 MED ORDER — SOLIFENACIN SUCCINATE 10 MG PO TABS: 5.0000 mg | ORAL_TABLET | Freq: Every day | ORAL | 3 refills | Status: DC

## 2017-06-15 NOTE — Telephone Encounter (Signed)
Spoke to patient; offered condolences regarding the passing of her husband on January 31st.  She is doing well.  Can cut vesicare tabs in half for 5mg  daily.

## 2017-06-15 NOTE — Telephone Encounter (Signed)
Dr. [name],   I spoke with Patient this morning and she stated that she was previously on 5mg  of Vesicare and the Pharmacy sent her 10mg , per our records we prescribed 10mg  on 04/16/2017, but she states that she still had some 5mg , left and it seems to be working well. The patient wants to know should she start the 10mg  or stay on the 5mg . Please Advise.

## 2017-06-19 DIAGNOSIS — M5136 Other intervertebral disc degeneration, lumbar region: Secondary | ICD-10-CM | POA: Diagnosis not present

## 2017-06-19 DIAGNOSIS — M5032 Other cervical disc degeneration, mid-cervical region, unspecified level: Secondary | ICD-10-CM | POA: Diagnosis not present

## 2017-06-19 DIAGNOSIS — M65849 Other synovitis and tenosynovitis, unspecified hand: Secondary | ICD-10-CM | POA: Diagnosis not present

## 2017-06-19 DIAGNOSIS — M9903 Segmental and somatic dysfunction of lumbar region: Secondary | ICD-10-CM | POA: Diagnosis not present

## 2017-06-19 DIAGNOSIS — M9901 Segmental and somatic dysfunction of cervical region: Secondary | ICD-10-CM | POA: Diagnosis not present

## 2017-06-19 DIAGNOSIS — S29012A Strain of muscle and tendon of back wall of thorax, initial encounter: Secondary | ICD-10-CM | POA: Diagnosis not present

## 2017-06-19 DIAGNOSIS — M9902 Segmental and somatic dysfunction of thoracic region: Secondary | ICD-10-CM | POA: Diagnosis not present

## 2017-06-20 DIAGNOSIS — H04123 Dry eye syndrome of bilateral lacrimal glands: Secondary | ICD-10-CM | POA: Diagnosis not present

## 2017-06-20 DIAGNOSIS — Z961 Presence of intraocular lens: Secondary | ICD-10-CM | POA: Diagnosis not present

## 2017-07-10 DIAGNOSIS — M9903 Segmental and somatic dysfunction of lumbar region: Secondary | ICD-10-CM | POA: Diagnosis not present

## 2017-07-10 DIAGNOSIS — M65849 Other synovitis and tenosynovitis, unspecified hand: Secondary | ICD-10-CM | POA: Diagnosis not present

## 2017-07-10 DIAGNOSIS — M5032 Other cervical disc degeneration, mid-cervical region, unspecified level: Secondary | ICD-10-CM | POA: Diagnosis not present

## 2017-07-10 DIAGNOSIS — S29012A Strain of muscle and tendon of back wall of thorax, initial encounter: Secondary | ICD-10-CM | POA: Diagnosis not present

## 2017-07-10 DIAGNOSIS — M5136 Other intervertebral disc degeneration, lumbar region: Secondary | ICD-10-CM | POA: Diagnosis not present

## 2017-07-10 DIAGNOSIS — M9902 Segmental and somatic dysfunction of thoracic region: Secondary | ICD-10-CM | POA: Diagnosis not present

## 2017-07-10 DIAGNOSIS — M9901 Segmental and somatic dysfunction of cervical region: Secondary | ICD-10-CM | POA: Diagnosis not present

## 2017-07-16 ENCOUNTER — Encounter: Payer: Self-pay | Admitting: Family Medicine

## 2017-07-16 ENCOUNTER — Other Ambulatory Visit: Payer: Self-pay

## 2017-07-16 ENCOUNTER — Ambulatory Visit (INDEPENDENT_AMBULATORY_CARE_PROVIDER_SITE_OTHER): Payer: Medicare Other | Admitting: Family Medicine

## 2017-07-16 VITALS — BP 138/84 | HR 61 | Temp 98.4°F | Ht 61.0 in | Wt 180.2 lb

## 2017-07-16 DIAGNOSIS — M19072 Primary osteoarthritis, left ankle and foot: Secondary | ICD-10-CM | POA: Diagnosis not present

## 2017-07-16 DIAGNOSIS — N3281 Overactive bladder: Secondary | ICD-10-CM

## 2017-07-16 DIAGNOSIS — F329 Major depressive disorder, single episode, unspecified: Secondary | ICD-10-CM

## 2017-07-16 DIAGNOSIS — F341 Dysthymic disorder: Secondary | ICD-10-CM

## 2017-07-16 DIAGNOSIS — F4321 Adjustment disorder with depressed mood: Secondary | ICD-10-CM | POA: Diagnosis not present

## 2017-07-16 DIAGNOSIS — N3946 Mixed incontinence: Secondary | ICD-10-CM

## 2017-07-16 MED ORDER — SOLIFENACIN SUCCINATE 10 MG PO TABS: 10.0000 mg | ORAL_TABLET | Freq: Every day | ORAL | 3 refills | Status: DC

## 2017-07-16 NOTE — Progress Notes (Signed)
Subjective  CC:  Chief Complaint  Patient presents with  . Depression    patient reports medications are helping    HPI: Martha Gilmore is a 77 y.o. female who presents to the office today to address the problems listed above in the chief complaint.  Grieving: husband passed with hospice care jan 31st. Overall, doing ok. But diffiucult time as expected; they were married for 52 years. His birthday is next week. Supportive children and church group and friends   OAB improved with increased dose of vesicare.   Fatigue: doing ok. No exertional sxs. Reviewed equivocal results of treadmill stress test; no longer concerned about heart disease and pt declines further evaluation.   Mood - grieving but celexa has been helpful.   Toe pain: has pain in left great toe with flexion only. No pain with walking. No redness or swelling. No injury.   I reviewed the patients updated PMH, FH, and SocHx.    Patient Active Problem List   Diagnosis Date Noted  . Postablative hypothyroidism 12/20/2015    Priority: High  . Major depression, chronic 04/16/2017    Priority: Medium  . Mixed hyperlipidemia 04/16/2017    Priority: Medium  . History of Graves' disease 03/22/2015    Priority: Medium  . GERD (gastroesophageal reflux disease) 12/13/2012    Priority: Medium  . Osteopenia 08/05/2012    Priority: Medium  . DDD (degenerative disc disease), lumbosacral 10/22/2010    Priority: Medium  . Bilateral sensorineural hearing loss 02/06/2016    Priority: Low  . OAB (overactive bladder) 12/16/2013    Priority: Low  . Osteoarthritis of [place] joint of thumb 08/13/2012    Priority: Low  . Seasonal allergies 08/13/2012    Priority: Low  . Urge and stress incontinence 08/13/2012    Priority: Low  . Clostridium difficile infection 04/16/2017  . Family history of early CAD 04/16/2017   Current Meds  Medication Sig  . atorvastatin (LIPITOR) 10 MG tablet Take 1 tablet (10 mg total) by mouth at bedtime.   . Calcium Carbonate-Vitamin D (CALCIUM-VITAMIN D3 PO) Take by mouth.  . citalopram (CELEXA) 20 MG tablet Take 1 tablet (20 mg total) by mouth daily.  Marland Kitchen levothyroxine (SYNTHROID, LEVOTHROID) 88 MCG tablet TAKE ONE TABLET (88 MCG TOTAL) BY MOUTH DAILY.  Marland Kitchen omeprazole (PRILOSEC) 20 MG capsule Take 1 capsule (20 mg total) by mouth daily.  Vladimir Faster Glycol-Propyl Glycol (SYSTANE OP) Apply 1 drop to eye as needed.  . solifenacin (VESICARE) 10 MG tablet Take 1 tablet (10 mg total) by mouth daily.  . [DISCONTINUED] Multiple Vitamins-Minerals (PRESERVISION AREDS 2 PO) Take 2 tablets by mouth daily.  . [DISCONTINUED] solifenacin (VESICARE) 10 MG tablet Take 0.5 tablets (5 mg total) by mouth daily.    Allergies: Patient is allergic to aspirin; meperidine; penicillins; statins; cephalexin; clindamycin/lincomycin; other; and tape. Family History: Patient family history includes Brain cancer in her brother; Cancer in her father; Diabetes in her sister; Heart disease in her father, maternal grandmother, mother, and sister; Kidney cancer in her sister; Lung cancer in her sister. Social History:  Patient  reports that she has never smoked. She has never used smokeless tobacco. She reports that she does not drink alcohol or use drugs.  Review of Systems: Constitutional: Negative for fever malaise or anorexia Cardiovascular: negative for chest pain Respiratory: negative for SOB or persistent cough Gastrointestinal: negative for abdominal pain  Objective  Vitals: BP 138/84   Pulse 61   Temp 98.4 F (36.9  C)   Ht 5\' 1"  (1.549 m)   Wt 180 lb 3.2 oz (81.7 kg)   BMI 34.05 kg/m  General: no acute distress , A&Ox3 HEENT: PEERL, conjunctiva normal, Oropharynx moist,neck is supple Cardiovascular:  RRR without murmur or gallop.  Respiratory:  Good breath sounds bilaterally, CTAB with normal respiratory effort Skin:  Warm, no rashes Left foot: nl appearing. Non-tender 1st joint  Assessment  1. Major  depression, chronic   2. Grieving   3. OAB (overactive bladder)   4. Urge and stress incontinence   5. Osteoarthritis of joint of toe of left foot      Plan   depression:  Grief; to consult with hospice for support goups.   oab is improved with vesicare.   Suspect oa of 1st interphalangeal toe joint. Reassured.   Follow up: Return in about 9 months (around 04/17/2018) for complete physical, 2months for awv.    Commons side effects, risks, benefits, and alternatives for medications and treatment plan prescribed today were discussed, and the patient expressed understanding of the given instructions. Patient is instructed to call or message via MyChart if he/she has any questions or concerns regarding our treatment plan. No barriers to understanding were identified. We discussed Red Flag symptoms and signs in detail. Patient expressed understanding regarding what to do in case of urgent or emergency type symptoms.   Medication list was reconciled, printed and provided to the patient in AVS. Patient instructions and summary information was reviewed with the patient as documented in the AVS. This note was prepared with assistance of Dragon voice recognition software. Occasional wrong-word or sound-a-like substitutions may have occurred due to the inherent limitations of voice recognition software  No orders of the defined types were placed in this encounter.  Meds ordered this encounter  Medications  . solifenacin (VESICARE) 10 MG tablet    Sig: Take 1 tablet (10 mg total) by mouth daily.    Dispense:  90 tablet    Refill:  3

## 2017-07-16 NOTE — Patient Instructions (Signed)
Please return in December for your annual complete physical; please come fasting.    Medicare recommends an Annual Wellness Visit for all patients. Please schedule this to be done with our Nurse Educator, Maudie Mercury. This is an informative "talk" visit; it's goals are to ensure that your health care needs are being met and to give you education regarding avoiding falls, ensuring you are not suffering from depression or problems with memory or thinking, and to educate you on Advance Care Planning. It helps me take good care of you!  If you have any questions or concerns, please don't hesitate to send me a message via MyChart or call the office at [phone]. Thank you for visiting with [place] today! It's our pleasure caring for you.   Coping With Loss, Adult People experience loss in many different ways throughout their lives. Events such as moving, changing jobs, and losing friends can create a sense of loss. The loss may be as serious as a major health change, divorce, death of a pet, or death of a loved one. All of these types of loss are likely to create a physical and emotional reaction known as grief. Grief is the result of a major change or an absence of something or someone that you count on. Grief is a normal reaction to loss. How to recognize changes A variety of factors can affect your grieving experience, including:  The nature of your loss.  Your relationship to what or whom you lost.  Your understanding of grief and how to cope with it.  Your support system.  The way that you deal with your grief will affect your ability to function as you normally do. When you are grieving, you may experience:  Numbness, shock, sadness, anxiety, anger, denial, and guilt.  Thoughts about death.  Unexpected crying.  A physical sensation of emptiness in your gut.  Problems sleeping and eating.  Fatigue.  Loss of interest in normal activities.  Dreaming about or imagining seeing the person who  died.  A need to remember what or whom you lost.  Difficulty thinking about anything other than your loss for a period of time.  Relief. If you have been expecting the loss for a while, you may feel a sense of relief when it happens.  Where to find support To get support for coping with loss:  Ask your health care provider for help and recommendations, such as grief counseling or therapy.  Think about joining a support group for people who are coping with loss.  Follow these instructions at home:  Be patient with yourself and others. Allow the grieving process to happen, and remember that grieving takes time. ? It is likely that you may never feel completely done with some grief. You may find a way to move on while still cherishing memories and feelings about your loss. ? Accepting your loss is a process. It can take months or longer to adjust.  Express your feelings in healthy ways, such as: ? Talking with others about your loss. It may be helpful to find others who have had a similar loss, such as a support group. ? Writing down your feelings in a journal. ? Doing physical activities to release stress and emotional energy. ? Doing creative activities like painting, sculpting, or playing or listening to music. ? Practicing resilience. This is the ability to recover and adjust after facing challenges. Reading some resources that encourage resilience may help you to learn ways to practice those behaviors.  Keep to your normal routine as much as possible. If you have trouble focusing or doing normal activities, it is acceptable to take some time away from your normal routine.  Spend time with friends and loved ones.  Eat a healthy diet, get plenty of sleep, and rest when you feel tired. Where to find more information: You can find more information about coping with loss from:  American Society of Clinical Oncology: www.cancer.net  American Psychological Association:  TVStereos.ch  Contact a health care provider if:  Your grief is extreme and keeps getting worse.  You have ongoing grief that does not improve.  Your body shows symptoms of grief, such as illness.  You feel depressed, anxious, or lonely. Get help right away if:  You have thoughts about hurting yourself or others. If you ever feel like you may hurt yourself or others, or have thoughts about taking your own life, get help right away. You can go to your nearest emergency department or call:  Your local emergency services (911 in the [place]  A suicide crisis helpline, such as the [facility] at (647)406-9833. This is open 24 hours a day.  Summary  Grief is a normal part of experiencing a loss. It is the result of a major change or an absence of something or someone that you count on.  The depth of grief and the period of recovery depend on the type of loss as well as your ability to adjust to the change and process your feelings.  Processing grief requires patience and a willingness to accept your feelings and talk about your loss with people who are supportive.  It is important to find resources that work for you and to realize that we are all different when it comes to grief. There is not one single grieving process that works for everyone in the same way.  Be aware that when grief becomes extreme, it can lead to more severe issues like isolation, depression, anxiety, or suicidal thoughts. Talk with your health care provider if you have any of these issues. This information is not intended to replace advice given to you by your health care provider. Make sure you discuss any questions you have with your health care provider. Document Released: 08/24/2016 Document Revised: 08/24/2016 Document Reviewed: 08/24/2016 Elsevier Interactive Patient Education  2018 [employer].

## 2017-07-17 DIAGNOSIS — M5136 Other intervertebral disc degeneration, lumbar region: Secondary | ICD-10-CM | POA: Diagnosis not present

## 2017-07-17 DIAGNOSIS — S29012A Strain of muscle and tendon of back wall of thorax, initial encounter: Secondary | ICD-10-CM | POA: Diagnosis not present

## 2017-07-17 DIAGNOSIS — M9902 Segmental and somatic dysfunction of thoracic region: Secondary | ICD-10-CM | POA: Diagnosis not present

## 2017-07-17 DIAGNOSIS — M5032 Other cervical disc degeneration, mid-cervical region, unspecified level: Secondary | ICD-10-CM | POA: Diagnosis not present

## 2017-07-17 DIAGNOSIS — M9903 Segmental and somatic dysfunction of lumbar region: Secondary | ICD-10-CM | POA: Diagnosis not present

## 2017-07-17 DIAGNOSIS — M9901 Segmental and somatic dysfunction of cervical region: Secondary | ICD-10-CM | POA: Diagnosis not present

## 2017-07-17 DIAGNOSIS — M65849 Other synovitis and tenosynovitis, unspecified hand: Secondary | ICD-10-CM | POA: Diagnosis not present

## 2017-07-24 DIAGNOSIS — S29012A Strain of muscle and tendon of back wall of thorax, initial encounter: Secondary | ICD-10-CM | POA: Diagnosis not present

## 2017-07-24 DIAGNOSIS — M9901 Segmental and somatic dysfunction of cervical region: Secondary | ICD-10-CM | POA: Diagnosis not present

## 2017-07-24 DIAGNOSIS — M5032 Other cervical disc degeneration, mid-cervical region, unspecified level: Secondary | ICD-10-CM | POA: Diagnosis not present

## 2017-07-24 DIAGNOSIS — M5136 Other intervertebral disc degeneration, lumbar region: Secondary | ICD-10-CM | POA: Diagnosis not present

## 2017-07-24 DIAGNOSIS — M9902 Segmental and somatic dysfunction of thoracic region: Secondary | ICD-10-CM | POA: Diagnosis not present

## 2017-07-24 DIAGNOSIS — M9903 Segmental and somatic dysfunction of lumbar region: Secondary | ICD-10-CM | POA: Diagnosis not present

## 2017-07-24 DIAGNOSIS — M65849 Other synovitis and tenosynovitis, unspecified hand: Secondary | ICD-10-CM | POA: Diagnosis not present

## 2017-08-21 DIAGNOSIS — M9903 Segmental and somatic dysfunction of lumbar region: Secondary | ICD-10-CM | POA: Diagnosis not present

## 2017-08-21 DIAGNOSIS — M9901 Segmental and somatic dysfunction of cervical region: Secondary | ICD-10-CM | POA: Diagnosis not present

## 2017-08-21 DIAGNOSIS — S29012A Strain of muscle and tendon of back wall of thorax, initial encounter: Secondary | ICD-10-CM | POA: Diagnosis not present

## 2017-08-21 DIAGNOSIS — M5136 Other intervertebral disc degeneration, lumbar region: Secondary | ICD-10-CM | POA: Diagnosis not present

## 2017-08-21 DIAGNOSIS — M65849 Other synovitis and tenosynovitis, unspecified hand: Secondary | ICD-10-CM | POA: Diagnosis not present

## 2017-08-21 DIAGNOSIS — M5032 Other cervical disc degeneration, mid-cervical region, unspecified level: Secondary | ICD-10-CM | POA: Diagnosis not present

## 2017-08-21 DIAGNOSIS — M9902 Segmental and somatic dysfunction of thoracic region: Secondary | ICD-10-CM | POA: Diagnosis not present

## 2017-08-22 DIAGNOSIS — H353131 Nonexudative age-related macular degeneration, bilateral, early dry stage: Secondary | ICD-10-CM | POA: Diagnosis not present

## 2017-08-22 DIAGNOSIS — H04123 Dry eye syndrome of bilateral lacrimal glands: Secondary | ICD-10-CM | POA: Diagnosis not present

## 2017-09-18 DIAGNOSIS — M65849 Other synovitis and tenosynovitis, unspecified hand: Secondary | ICD-10-CM | POA: Diagnosis not present

## 2017-09-18 DIAGNOSIS — M9902 Segmental and somatic dysfunction of thoracic region: Secondary | ICD-10-CM | POA: Diagnosis not present

## 2017-09-18 DIAGNOSIS — M9901 Segmental and somatic dysfunction of cervical region: Secondary | ICD-10-CM | POA: Diagnosis not present

## 2017-09-18 DIAGNOSIS — S29012A Strain of muscle and tendon of back wall of thorax, initial encounter: Secondary | ICD-10-CM | POA: Diagnosis not present

## 2017-09-18 DIAGNOSIS — M5136 Other intervertebral disc degeneration, lumbar region: Secondary | ICD-10-CM | POA: Diagnosis not present

## 2017-09-18 DIAGNOSIS — M9903 Segmental and somatic dysfunction of lumbar region: Secondary | ICD-10-CM | POA: Diagnosis not present

## 2017-09-18 DIAGNOSIS — M5032 Other cervical disc degeneration, mid-cervical region, unspecified level: Secondary | ICD-10-CM | POA: Diagnosis not present

## 2017-09-26 DIAGNOSIS — E890 Postprocedural hypothyroidism: Secondary | ICD-10-CM | POA: Diagnosis not present

## 2017-10-16 DIAGNOSIS — M65849 Other synovitis and tenosynovitis, unspecified hand: Secondary | ICD-10-CM | POA: Diagnosis not present

## 2017-10-16 DIAGNOSIS — M9901 Segmental and somatic dysfunction of cervical region: Secondary | ICD-10-CM | POA: Diagnosis not present

## 2017-10-16 DIAGNOSIS — S29012A Strain of muscle and tendon of back wall of thorax, initial encounter: Secondary | ICD-10-CM | POA: Diagnosis not present

## 2017-10-16 DIAGNOSIS — M5136 Other intervertebral disc degeneration, lumbar region: Secondary | ICD-10-CM | POA: Diagnosis not present

## 2017-10-16 DIAGNOSIS — M9902 Segmental and somatic dysfunction of thoracic region: Secondary | ICD-10-CM | POA: Diagnosis not present

## 2017-10-16 DIAGNOSIS — M9903 Segmental and somatic dysfunction of lumbar region: Secondary | ICD-10-CM | POA: Diagnosis not present

## 2017-10-16 DIAGNOSIS — M5032 Other cervical disc degeneration, mid-cervical region, unspecified level: Secondary | ICD-10-CM | POA: Diagnosis not present

## 2017-11-05 ENCOUNTER — Ambulatory Visit (INDEPENDENT_AMBULATORY_CARE_PROVIDER_SITE_OTHER): Payer: Medicare Other | Admitting: Family Medicine

## 2017-11-05 ENCOUNTER — Encounter: Payer: Self-pay | Admitting: Family Medicine

## 2017-11-05 ENCOUNTER — Other Ambulatory Visit: Payer: Self-pay

## 2017-11-05 VITALS — BP 132/80 | HR 61 | Temp 98.0°F | Ht 61.0 in | Wt 183.6 lb

## 2017-11-05 DIAGNOSIS — M94261 Chondromalacia, right knee: Secondary | ICD-10-CM | POA: Diagnosis not present

## 2017-11-05 DIAGNOSIS — M1711 Unilateral primary osteoarthritis, right knee: Secondary | ICD-10-CM | POA: Diagnosis not present

## 2017-11-05 MED ORDER — TRIAMCINOLONE ACETONIDE 40 MG/ML IJ SUSP
40.0000 mg | Freq: Once | INTRAMUSCULAR | Status: AC
  Administered 2017-11-05: 40 mg via INTRA_ARTICULAR

## 2017-11-05 NOTE — Progress Notes (Signed)
Subjective  CC:  Chief Complaint  Patient presents with  . Knee Pain    Right Knee Pain x 10 days     HPI: Martha Gilmore is a 77 y.o. female who presents to the office today to address the problems listed above in the chief complaint.  She has OA and chondromalacia of the  Right knee(s) and is having pain that impedes her quality of life and activity level. She was evalauted 04/2016 by Dr. Michaelle Birks and treated with steroid injection that relieved problem. At that time, he was considering a  Mensicus tear. Now, after a trip to [facility], she has increased pain again, especially with going up or down stairs. No locking or new injury but at times feels unstable. We have used alternative measures for pain control including tylenol, nsaids, ice, rest, and other prescribed analgesics with variable success.   I reviewed the patients updated PMH, FH, and SocHx.    Patient Active Problem List   Diagnosis Date Noted  . Postablative hypothyroidism 12/20/2015    Priority: High  . Major depression, chronic 04/16/2017    Priority: Medium  . Mixed hyperlipidemia 04/16/2017    Priority: Medium  . History of Graves' disease 03/22/2015    Priority: Medium  . GERD (gastroesophageal reflux disease) 12/13/2012    Priority: Medium  . Osteopenia 08/05/2012    Priority: Medium  . DDD (degenerative disc disease), lumbosacral 10/22/2010    Priority: Medium  . Bilateral sensorineural hearing loss 02/06/2016    Priority: Low  . OAB (overactive bladder) 12/16/2013    Priority: Low  . Osteoarthritis of [place] joint of thumb 08/13/2012    Priority: Low  . Seasonal allergies 08/13/2012    Priority: Low  . Urge and stress incontinence 08/13/2012    Priority: Low  . Chondromalacia, knee, right 11/05/2017  . Osteoarthrosis, localized, primary, knee, right 11/05/2017  . Clostridium difficile infection 04/16/2017  . Family history of early CAD 04/16/2017   Current Meds  Medication Sig  . atorvastatin  (LIPITOR) 10 MG tablet Take 1 tablet (10 mg total) by mouth at bedtime.  . Calcium Carbonate-Vitamin D (CALCIUM-VITAMIN D3 PO) Take by mouth.  . citalopram (CELEXA) 20 MG tablet Take 1 tablet (20 mg total) by mouth daily.  . Levothyroxine Sodium 100 MCG CAPS TAKE ONE TABLET (88 MCG TOTAL) BY MOUTH DAILY.  Marland Kitchen omeprazole (PRILOSEC) 20 MG capsule Take 1 capsule (20 mg total) by mouth daily.  Vladimir Faster Glycol-Propyl Glycol (SYSTANE OP) Apply 1 drop to eye as needed.  . solifenacin (VESICARE) 10 MG tablet Take 1 tablet (10 mg total) by mouth daily.    Allergies: Patient is allergic to aspirin; meperidine; penicillins; statins; cephalexin; clindamycin/lincomycin; demerol  [meperidine hcl]; other; and tape.  Review of Systems: Constitutional: Negative for fever malaise or anorexia Cardiovascular: negative for chest pain Respiratory: negative for SOB or persistent cough Gastrointestinal: negative for abdominal pain  Objective  Vitals: BP 132/80   Pulse 61   Temp 98 F (36.7 C)   Ht 5\' 1"  (1.549 m)   Wt 183 lb 9.6 oz (83.3 kg)   SpO2 98%   BMI 34.69 kg/m  General: no acute distress , A&Ox3 Cardiovascular:  RRR without murmur  Knee:  Right no collateral ligament laxity, posterior patella tenderness and medial joint line ttp w/ neg mcmurray's, warm Skin:  Warm, no rashes  Procedure note for Knee Joint Aspiration and/or Injection:  Indications: pain relief, failed conservative therapies  Knee Arthrocentesis with Aspiration  and Injection Procedure Note  Pre-operative Diagnosis: right chondromalacia of knee and pin  Post-operative Diagnosis: same  Indications: Symptomatic relief   Anesthesia: Lidocaine 1% without epinephrine without added sodium bicarbonate  Procedure Details   Verbal consent was obtained for the procedure. Universal time out taken.  The Knee joint was prepped with alcohol and an 18 gauge needle was inserted into the joint from the lateral approach. Four ml 1%  lidocaine and one ml of triamcinolone (KENALOG) 40mg /ml was then injected into the joint. The needle was removed and the area cleansed and dressed.  Complications:  None; patient tolerated the procedure well.   Assessment  1. Chondromalacia, knee, right   2. Osteoarthrosis, localized, primary, knee, right      Plan   S/p intra-articular knee joint aspiration and steroid injection:  Routine post procedure care discussed. Rest, Ice, Nsaids as needed and ROM exercises recommended. F/u care printed out for patient in AVS.   Discussed urgent f/u if develops increased pain, redness or fever.   Follow up: dec for cpe and awv.    Commons side effects, risks, benefits, and alternatives for medications and treatment plan prescribed today were discussed, and the patient expressed understanding of the given instructions. Patient is instructed to call or message via MyChart if he/she has any questions or concerns regarding our treatment plan. No barriers to understanding were identified. We discussed Red Flag symptoms and signs in detail. Patient expressed understanding regarding what to do in case of urgent or emergency type symptoms.   Medication list was reconciled, printed and provided to the patient in AVS. Patient instructions and summary information was reviewed with the patient as documented in the AVS. This note was prepared with assistance of Dragon voice recognition software. Occasional wrong-word or sound-a-like substitutions may have occurred due to the inherent limitations of voice recognition software  No orders of the defined types were placed in this encounter.  No orders of the defined types were placed in this encounter.

## 2017-11-05 NOTE — Patient Instructions (Addendum)
Please return in December for your annual complete physical; please come fasting. You may schedule your AWV anytime.   Medicare recommends an Annual Wellness Visit for all patients. Please schedule this to be done with our Nurse Educator, Maudie Mercury. This is an informative "talk" visit; it's goals are to ensure that your health care needs are being met and to give you education regarding avoiding falls, ensuring you are not suffering from depression or problems with memory or thinking, and to educate you on Advance Care Planning. It helps me take good care of you!   If you have any questions or concerns, please don't hesitate to send me a message via MyChart or call the office at [phone]. Thank you for visiting with [place] today! It's our pleasure caring for you.  Knee Injection, Care After Refer to this sheet in the next few weeks. These instructions provide you with information about caring for yourself after your procedure. Your health care provider may also give you more specific instructions. Your treatment has been planned according to current medical practices, but problems sometimes occur. Call your health care provider if you have any problems or questions after your procedure. What can I expect after the procedure? After the procedure, it is common to have:  Soreness.  Warmth.  Swelling.  You may have more pain, swelling, and warmth than you did before the injection. This reaction may last for about one day. Follow these instructions at home: Bathing  If you were given a bandage (dressing), keep it dry until your health care provider says it can be removed. Ask your health care provider when you can start showering or taking a bath. Managing pain, stiffness, and swelling  If directed, apply ice to the injection area: ? Put ice in a plastic bag. ? Place a towel between your skin and the bag. ? Leave the ice on for 20 minutes, 2-3 times per day.  Do not apply heat to your  knee.  Raise the injection area above the level of your heart while you are sitting or lying down. Activity  Avoid strenuous activities for as long as directed by your health care provider. Ask your health care provider when you can return to your normal activities. General instructions  Take medicines only as directed by your health care provider.  Do not take aspirin or other over-the-counter medicines unless your health care provider says you can.  Check your injection site every day for signs of infection. Watch for: ? Redness, swelling, or pain. ? Fluid, blood, or pus.  Follow your health care provider's instructions about dressing changes and removal. Contact a health care provider if:  You have symptoms at your injection site that last longer than two days after your procedure.  You have redness, swelling, or pain in your injection area.  You have fluid, blood, or pus coming from your injection site.  You have warmth in your injection area.  You have a fever.  Your pain is not controlled with medicine. Get help right away if:  Your knee turns very red.  Your knee becomes very swollen.  Your knee pain is severe. This information is not intended to replace advice given to you by your health care provider. Make sure you discuss any questions you have with your health care provider. Document Released: 05/01/2014 Document Revised: 12/15/2015 Document Reviewed: 02/18/2014 Elsevier Interactive Patient Education  [employer].

## 2017-11-13 DIAGNOSIS — M5032 Other cervical disc degeneration, mid-cervical region, unspecified level: Secondary | ICD-10-CM | POA: Diagnosis not present

## 2017-11-13 DIAGNOSIS — M65849 Other synovitis and tenosynovitis, unspecified hand: Secondary | ICD-10-CM | POA: Diagnosis not present

## 2017-11-13 DIAGNOSIS — M9901 Segmental and somatic dysfunction of cervical region: Secondary | ICD-10-CM | POA: Diagnosis not present

## 2017-11-13 DIAGNOSIS — M9902 Segmental and somatic dysfunction of thoracic region: Secondary | ICD-10-CM | POA: Diagnosis not present

## 2017-11-13 DIAGNOSIS — M5136 Other intervertebral disc degeneration, lumbar region: Secondary | ICD-10-CM | POA: Diagnosis not present

## 2017-11-13 DIAGNOSIS — M9903 Segmental and somatic dysfunction of lumbar region: Secondary | ICD-10-CM | POA: Diagnosis not present

## 2017-11-13 DIAGNOSIS — S29012A Strain of muscle and tendon of back wall of thorax, initial encounter: Secondary | ICD-10-CM | POA: Diagnosis not present

## 2017-12-28 ENCOUNTER — Encounter: Payer: Self-pay | Admitting: Family Medicine

## 2017-12-31 MED ORDER — OXYBUTYNIN CHLORIDE ER 10 MG PO TB24: 10.0000 mg | ORAL_TABLET | Freq: Every day | ORAL | 3 refills | Status: DC

## 2018-01-18 DIAGNOSIS — Z23 Encounter for immunization: Secondary | ICD-10-CM | POA: Diagnosis not present

## 2018-02-08 DIAGNOSIS — B369 Superficial mycosis, unspecified: Secondary | ICD-10-CM | POA: Diagnosis not present

## 2018-02-08 DIAGNOSIS — H6062 Unspecified chronic otitis externa, left ear: Secondary | ICD-10-CM | POA: Diagnosis not present

## 2018-02-11 ENCOUNTER — Other Ambulatory Visit: Payer: Self-pay | Admitting: Family Medicine

## 2018-02-11 DIAGNOSIS — Z1231 Encounter for screening mammogram for malignant neoplasm of breast: Secondary | ICD-10-CM

## 2018-02-27 ENCOUNTER — Ambulatory Visit: Payer: Medicare Other

## 2018-02-27 NOTE — Progress Notes (Signed)
Subjective:   Martha Gilmore is a 77 y.o. female who presents for Medicare Annual (Subsequent) preventive examination.  Review of Systems:  No ROS.  Medicare Wellness Visit. Additional risk factors are reflected in the social history.  Cardiac Risk Factors include: advanced age (>33men, >14 women);dyslipidemia;hypertension;obesity (BMI >30kg/m2);sedentary lifestyle;family history of premature cardiovascular disease Sleep patterns: Sleeps 6-8 hours.  Home Safety/Smoke Alarms: Feels safe in home. Smoke alarms in place.  Living environment; residence and Firearm Safety: Lives alone in 1 story home. Steps at door, no rail.  Seat Belt Safety/Bike Helmet: Wears seat belt.   Female:   Pap-N/A   Mammo-03/23/2017, BI-RADS CATEGORY  1: Negative. Scheduled 03/25/2018     Dexa scan-02/12/2015, Osteopenia.  Ordered toda [place] [facility]    [facility]S-Colonoscopy 04/05/2015     Objective:     Vitals: BP 122/68 (BP Location: Left Arm, Patient Position: Sitting, Cuff Size: Normal)   Pulse 60   Ht 5\' 1"  (1.549 m)   Wt 184 lb 2 oz (83.5 kg)   SpO2 99%   BMI 34.79 kg/m   Body mass index is 34.79 kg/m.  Advanced Directives 02/28/2018  Does Patient Have a Medical Advance Directive? Yes  Type of Advance Directive Living will;Healthcare Power of [place] in Chart? No - copy requested    Tobacco Social History   Tobacco Use  Smoking Status Never Smoker  Smokeless Tobacco Never Used     Counseling given: Not Answered   Past Medical History:  Diagnosis Date  . Cataracts, bilateral 09/23/2015  . Clostridium difficile infection 04/16/2017   H/o C.diff due to clindamicyin 09/2016; two rounds of treatment for cure. Dr. Benson [place]  . Depression   . Hyperlipidemia   . Hypertension   . OAB (overactive bladder) 12/16/2013  . Osteopenia 08/05/2012   Dexa scan 07/2012 mild osteopenia, T = -1.1  . Seasonal allergies   . Urge and stress incontinence    Past Surgical  History:  Procedure Laterality Date  . APPENDECTOMY  1971  . CESAREAN SECTION    . CHOLECYSTECTOMY    . Teeth Implants    . TRIGGER FINGER RELEASE    . VAGINAL HYSTERECTOMY     Family History  Problem Relation Age of Onset  . Heart disease Mother   . Cancer Father   . Heart disease Father   . Lung cancer Sister   . Heart disease Sister   . Diabetes Sister   . Brain cancer Brother   . Heart disease Maternal Grandmother   . Kidney cancer Sister    Social History   Socioeconomic History  . Marital status: Married    Spouse name: Not on file  . Number of children: Not on file  . Years of education: Not on file  . Highest education level: Not on file  Occupational History  . Not on file  Social Needs  . Financial resource strain: Not on file  . Food insecurity:    Worry: Not on file    Inability: Not on file  . Transportation needs:    Medical: Not on file    Non-medical: Not on file  Tobacco Use  . Smoking status: Never Smoker  . Smokeless tobacco: Never Used  Substance and Sexual Activity  . Alcohol use: No    Frequency: Never  . Drug use: No  . Sexual activity: Never    Partners: Male  Lifestyle  . Physical activity:    Days per  week: Not on file    Minutes per session: Not on file  . Stress: Not on file  Relationships  . Social connections:    Talks on phone: Not on file    Gets together: Not on file    Attends religious service: Not on file    Active member of club or organization: Not on file    Attends meetings of clubs or organizations: Not on file    Relationship status: Not on file  Other Topics Concern  . Not on file  Social History Narrative  . Not on file    Outpatient Encounter Medications as of 02/28/2018  Medication Sig  . atorvastatin (LIPITOR) 10 MG tablet Take 1 tablet (10 mg total) by mouth at bedtime.  . Calcium Carbonate-Vitamin D (CALCIUM-VITAMIN D3 PO) Take by mouth.  . citalopram (CELEXA) 20 MG tablet Take 1 tablet (20 mg  total) by mouth daily.  . Levothyroxine Sodium 100 MCG CAPS TAKE ONE TABLET (88 MCG TOTAL) BY MOUTH DAILY.  . Multiple Vitamins-Minerals (PRESERVISION AREDS 2 PO) Take by mouth.  Marland Kitchen omeprazole (PRILOSEC) 20 MG capsule Take 1 capsule (20 mg total) by mouth daily.  Marland Kitchen oxybutynin (DITROPAN-XL) 10 MG 24 hr tablet Take 1 tablet (10 mg total) by mouth at bedtime.  Vladimir Faster Glycol-Propyl Glycol (SYSTANE OP) Apply 1 drop to eye as needed.  . Zoster Vaccine Adjuvanted [facility]) injection Inject 0.5 mLs into the muscle once for 1 dose.   No facility-administered encounter medications on file as of 02/28/2018.     Activities of Daily Living In your present state of health, do you have any difficulty performing the following activities: 02/28/2018  Hearing? N  Vision? N  Difficulty concentrating or making decisions? N  Walking or climbing stairs? N  Dressing or bathing? N  Doing errands, shopping? N  Preparing Food and eating ? N  Using the Toilet? N  In the past six months, have you accidently leaked urine? N  Do you have problems with loss of bowel control? N  Managing your Medications? N  Managing your Finances? N  Housekeeping or managing your Housekeeping? N  Some recent data might be hidden    Patient Care Team: Leamon Arnt, MD as PCP - General (Family Medicine) Susette Racer, MD as Consulting Physician (Endocrinology) Carol Ada, MD as Consulting Physician (Gastroenterology) Roseanne Kaufman, MD as Consulting Physician (Orthopedic Surgery) Rolm Bookbinder, MD as Consulting Physician (Dermatology)    Assessment:   This is a routine wellness examination for Martha Gilmore.  Exercise Activities and Dietary recommendations Current Exercise Habits: The patient does not participate in regular exercise at present, Exercise limited by: None identified Diet (meal preparation, eat out, water intake, caffeinated beverages, dairy products, fruits and vegetables): Drinks Diet Coke and water.    Breakfast: JD delight sandwich Lunch: premade salads; fast food Dinner: Frozen dinner    Goals    . Increase physical activity     Increase activity       Fall Risk Fall Risk  02/28/2018 04/16/2017  Falls in the past year? 0 Yes  Number falls in past yr: - 1  Injury with Fall? - No  Risk for fall due to : - Impaired balance/gait    Depression Screen PHQ 2/9 Scores 02/28/2018 11/05/2017 04/16/2017  PHQ - 2 Score 0 0 0  PHQ- 9 Score 3 0 -     Cognitive Function MMSE - Mini Mental State Exam 02/28/2018  Orientation to time 5  Orientation to  Place 5  Registration 3  Attention/ Calculation 5  Recall 2  Language- name 2 objects 2  Language- repeat 1  Language- follow 3 step command 3  Language- read & follow direction 1  Write a sentence 1  Copy design 1  Total score 29        Immunization History  Administered Date(s) Administered  . Influenza Split 01/15/2012  . Influenza, High Dose Seasonal PF 01/30/2013, 01/05/2015, 02/17/2016, 02/02/2017  . Influenza, Seasonal, Injecte, Preservative Fre 01/21/2014  . Influenza-Unspecified 01/09/2011, 01/15/2012, 01/21/2014  . Pneumococcal Conjugate-13 01/05/2015  . Pneumococcal Polysaccharide-23 04/24/2006, 11/18/2007, 05/30/2012  . Tdap 08/17/2010  . Zoster 04/25/1995    Screening Tests Health Maintenance  Topic Date Due  . DEXA SCAN  02/11/2018  . MAMMOGRAM  03/23/2018  . TETANUS/TDAP  08/16/2020  . INFLUENZA VACCINE  Completed  . PNA vac Low Risk Adult  Completed        Plan:     Shingles vaccine at pharmacy.   Schedule bone scan with mammogram.  Bring a copy of your living will and/or healthcare power of attorney to your next office visit.  Continue doing brain stimulating activities (puzzles, reading, adult coloring books, staying active) to keep memory sharp.   I have personally reviewed and noted the following in the patient's chart:   . Medical and social history . Use of alcohol, tobacco or  illicit drugs  . Current medications and supplements . Functional ability and status . Nutritional status . Physical activity . Advanced directives . List of other physicians . Hospitalizations, surgeries, and ER visits in previous 12 months . Vitals . Screenings to include cognitive, depression, and falls . Referrals and appointments  In addition, I have reviewed and discussed with patient certain preventive protocols, quality metrics, and best practice recommendations. A written personalized care plan for preventive services as well as general preventive health recommendations were provided to patient.     Gerilyn Nestle, RN  02/28/2018   F/U with PCP 03/26/18 (CPE)

## 2018-02-28 ENCOUNTER — Ambulatory Visit (INDEPENDENT_AMBULATORY_CARE_PROVIDER_SITE_OTHER): Payer: Medicare Other

## 2018-02-28 ENCOUNTER — Other Ambulatory Visit: Payer: Self-pay

## 2018-02-28 VITALS — BP 122/68 | HR 60 | Ht 61.0 in | Wt 184.1 lb

## 2018-02-28 DIAGNOSIS — E669 Obesity, unspecified: Secondary | ICD-10-CM

## 2018-02-28 DIAGNOSIS — Z23 Encounter for immunization: Secondary | ICD-10-CM | POA: Diagnosis not present

## 2018-02-28 DIAGNOSIS — E2839 Other primary ovarian failure: Secondary | ICD-10-CM | POA: Diagnosis not present

## 2018-02-28 DIAGNOSIS — Z0000 Encounter for general adult medical examination without abnormal findings: Secondary | ICD-10-CM

## 2018-02-28 MED ORDER — ZOSTER VAC RECOMB ADJUVANTED 50 MCG/0.5ML IM SUSR: 0.5000 mL | Freq: Once | INTRAMUSCULAR | 1 refills | Status: AC

## 2018-02-28 NOTE — Progress Notes (Signed)
I have reviewed the documentation from the recent AWV done by Kim Broome; I agree with the documentation and will follow up on any recommendations or abnormal findings as suggested.  

## 2018-02-28 NOTE — Patient Instructions (Addendum)
Shingles vaccine at pharmacy.   Schedule bone scan with mammogram.  Bring a copy of your living will and/or healthcare power of attorney to your next office visit.  Continue doing brain stimulating activities (puzzles, reading, adult coloring books, staying active) to keep memory sharp.   Health Maintenance, Female Adopting a healthy lifestyle and getting preventive care can go a long way to promote health and wellness. Talk with your health care provider about what schedule of regular examinations is right for you. This is a good chance for you to check in with your provider about disease prevention and staying healthy. In between checkups, there are plenty of things you can do on your own. Experts have done a lot of research about which lifestyle changes and preventive measures are most likely to keep you healthy. Ask your health care provider for more information. Weight and diet Eat a healthy diet  Be sure to include plenty of vegetables, fruits, low-fat dairy products, and lean protein.  Do not eat a lot of foods high in solid fats, added sugars, or salt.  Get regular exercise. This is one of the most important things you can do for your health. ? Most adults should exercise for at least 150 minutes each week. The exercise should increase your heart rate and make you sweat (moderate-intensity exercise). ? Most adults should also do strengthening exercises at least twice a week. This is in addition to the moderate-intensity exercise.  Maintain a healthy weight  Body mass index (BMI) is a measurement that can be used to identify possible weight problems. It estimates body fat based on height and weight. Your health care provider can help determine your BMI and help you achieve or maintain a healthy weight.  For females 77 years of age and older: ? A BMI below 18.5 is considered underweight. ? A BMI of 18.5 to 24.9 is normal. ? A BMI of 25 to 29.9 is considered overweight. ? A BMI of  30 and above is considered obese.  Watch levels of cholesterol and blood lipids  You should start having your blood tested for lipids and cholesterol at 76 years of age, then have this test every 5 years.  You may need to have your cholesterol levels checked more often if: ? Your lipid or cholesterol levels are high. ? You are older than 76 years of age. ? You are at high risk for heart disease.  Cancer screening Lung Cancer  Lung cancer screening is recommended for adults 33-67 years old who are at high risk for lung cancer because of a history of smoking.  A yearly low-dose CT scan of the lungs is recommended for people who: ? Currently smoke. ? Have quit within the past 15 years. ? Have at least a 30-pack-year history of smoking. A pack year is smoking an average of one pack of cigarettes a day for 1 year.  Yearly screening should continue until it has been 15 years since you quit.  Yearly screening should stop if you develop a health problem that would prevent you from having lung cancer treatment.  Breast Cancer  Practice breast self-awareness. This means understanding how your breasts normally appear and feel.  It also means doing regular breast self-exams. Let your health care provider know about any changes, no matter how small.  If you are in your 20s or 30s, you should have a clinical breast exam (CBE) by a health care provider every 1-3 years as part of a regular health  exam.  If you are 40 or older, have a CBE every year. Also consider having a breast X-ray (mammogram) every year.  If you have a family history of breast cancer, talk to your health care provider about genetic screening.  If you are at high risk for breast cancer, talk to your health care provider about having an MRI and a mammogram every year.  Breast cancer gene (BRCA) assessment is recommended for women who have family members with BRCA-related cancers. BRCA-related cancers  include: ? Breast. ? Ovarian. ? Tubal. ? Peritoneal cancers.  Results of the assessment will determine the need for genetic counseling and BRCA1 and BRCA2 testing.  Cervical Cancer Your health care provider may recommend that you be screened regularly for cancer of the pelvic organs (ovaries, uterus, and vagina). This screening involves a pelvic examination, including checking for microscopic changes to the surface of your cervix (Pap test). You may be encouraged to have this screening done every 3 years, beginning at age 55.  For women ages 59-65, health care providers may recommend pelvic exams and Pap testing every 3 years, or they may recommend the Pap and pelvic exam, combined with testing for human papilloma virus (HPV), every 5 years. Some types of HPV increase your risk of cervical cancer. Testing for HPV may also be done on women of any age with unclear Pap test results.  Other health care providers may not recommend any screening for nonpregnant women who are considered low risk for pelvic cancer and who do not have symptoms. Ask your health care provider if a screening pelvic exam is right for you.  If you have had past treatment for cervical cancer or a condition that could lead to cancer, you need Pap tests and screening for cancer for at least 20 years after your treatment. If Pap tests have been discontinued, your risk factors (such as having a new sexual partner) need to be reassessed to determine if screening should resume. Some women have medical problems that increase the chance of getting cervical cancer. In these cases, your health care provider may recommend more frequent screening and Pap tests.  Colorectal Cancer  This type of cancer can be detected and often prevented.  Routine colorectal cancer screening usually begins at 76 years of age and continues through 76 years of age.  Your health care provider may recommend screening at an earlier age if you have risk factors  for colon cancer.  Your health care provider may also recommend using home test kits to check for hidden blood in the stool.  A small camera at the end of a tube can be used to examine your colon directly (sigmoidoscopy or colonoscopy). This is done to check for the earliest forms of colorectal cancer.  Routine screening usually begins at age 75.  Direct examination of the colon should be repeated every 5-10 years through 76 years of age. However, you may need to be screened more often if early forms of precancerous polyps or small growths are found.  Skin Cancer  Check your skin from head to toe regularly.  Tell your health care provider about any new moles or changes in moles, especially if there is a change in a mole's shape or color.  Also tell your health care provider if you have a mole that is larger than the size of a pencil eraser.  Always use sunscreen. Apply sunscreen liberally and repeatedly throughout the day.  Protect yourself by wearing long sleeves, pants, a wide-brimmed  hat, and sunglasses whenever you are outside.  Heart disease, diabetes, and high blood pressure  High blood pressure causes heart disease and increases the risk of stroke. High blood pressure is more likely to develop in: ? People who have blood pressure in the high end of the normal range (130-139/85-89 mm Hg). ? People who are overweight or obese. ? People who are African American.  If you are 77-97 years of age, have your blood pressure checked every 3-5 years. If you are 30 years of age or older, have your blood pressure checked every year. You should have your blood pressure measured twice-once when you are at a hospital or clinic, and once when you are not at a hospital or clinic. Record the average of the two measurements. To check your blood pressure when you are not at a hospital or clinic, you can use: ? An automated blood pressure machine at a pharmacy. ? A home blood pressure monitor.  If  you are between 7 years and 48 years old, ask your health care provider if you should take aspirin to prevent strokes.  Have regular diabetes screenings. This involves taking a blood sample to check your fasting blood sugar level. ? If you are at a normal weight and have a low risk for diabetes, have this test once every three years after 76 years of age. ? If you are overweight and have a high risk for diabetes, consider being tested at a younger age or more often. Preventing infection Hepatitis B  If you have a higher risk for hepatitis B, you should be screened for this virus. You are considered at high risk for hepatitis B if: ? You were born in a country where hepatitis B is common. Ask your health care provider which countries are considered high risk. ? Your parents were born in a high-risk country, and you have not been immunized against hepatitis B (hepatitis B vaccine). ? You have HIV or AIDS. ? You use needles to inject street drugs. ? You live with someone who has hepatitis B. ? You have had sex with someone who has hepatitis B. ? You get hemodialysis treatment. ? You take certain medicines for conditions, including cancer, organ transplantation, and autoimmune conditions.  Hepatitis C  Blood testing is recommended for: ? Everyone born from [birth year] through 1964. ? Anyone with known risk factors for hepatitis C.  Sexually transmitted infections (STIs)  You should be screened for sexually transmitted infections (STIs) including gonorrhea and chlamydia if: ? You are sexually active and are younger than 76 years of age. ? You are older than 76 years of age and your health care provider tells you that you are at risk for this type of infection. ? Your sexual activity has changed since you were last screened and you are at an increased risk for chlamydia or gonorrhea. Ask your health care provider if you are at risk.  If you do not have HIV, but are at risk, it may be recommended  that you take a prescription medicine daily to prevent HIV infection. This is called pre-exposure prophylaxis (PrEP). You are considered at risk if: ? You are sexually active and do not regularly use condoms or know the HIV status of your partner(s). ? You take drugs by injection. ? You are sexually active with a partner who has HIV.  Talk with your health care provider about whether you are at high risk of being infected with HIV. If you choose to begin  PrEP, you should first be tested for HIV. You should then be tested every 3 months for as long as you are taking PrEP. Pregnancy  If you are premenopausal and you may become pregnant, ask your health care provider about preconception counseling.  If you may become pregnant, take 400 to 800 micrograms (mcg) of folic acid every day.  If you want to prevent pregnancy, talk to your health care provider about birth control (contraception). Osteoporosis and menopause  Osteoporosis is a disease in which the bones lose minerals and strength with aging. This can result in serious bone fractures. Your risk for osteoporosis can be identified using a bone density scan.  If you are 22 years of age or older, or if you are at risk for osteoporosis and fractures, ask your health care provider if you should be screened.  Ask your health care provider whether you should take a calcium or vitamin D supplement to lower your risk for osteoporosis.  Menopause may have certain physical symptoms and risks.  Hormone replacement therapy may reduce some of these symptoms and risks. Talk to your health care provider about whether hormone replacement therapy is right for you. Follow these instructions at home:  Schedule regular health, dental, and eye exams.  Stay current with your immunizations.  Do not use any tobacco products including cigarettes, chewing tobacco, or electronic cigarettes.  If you are pregnant, do not drink alcohol.  If you are  breastfeeding, limit how much and how often you drink alcohol.  Limit alcohol intake to no more than 1 drink per day for nonpregnant women. One drink equals 12 ounces of beer, 5 ounces of wine, or 1 ounces of hard liquor.  Do not use street drugs.  Do not share needles.  Ask your health care provider for help if you need support or information about quitting drugs.  Tell your health care provider if you often feel depressed.  Tell your health care provider if you have ever been abused or do not feel safe at home. This information is not intended to replace advice given to you by your health care provider. Make sure you discuss any questions you have with your health care provider. Document Released: 10/24/2010 Document Revised: 09/16/2015 Document Reviewed: 01/12/2015 Elsevier Interactive Patient Education  [employer].

## 2018-03-06 DIAGNOSIS — H353131 Nonexudative age-related macular degeneration, bilateral, early dry stage: Secondary | ICD-10-CM | POA: Diagnosis not present

## 2018-03-06 DIAGNOSIS — H43813 Vitreous degeneration, bilateral: Secondary | ICD-10-CM | POA: Diagnosis not present

## 2018-03-06 DIAGNOSIS — H35363 Drusen (degenerative) of macula, bilateral: Secondary | ICD-10-CM | POA: Diagnosis not present

## 2018-03-06 DIAGNOSIS — H04123 Dry eye syndrome of bilateral lacrimal glands: Secondary | ICD-10-CM | POA: Diagnosis not present

## 2018-03-25 ENCOUNTER — Ambulatory Visit
Admission: RE | Admit: 2018-03-25 | Discharge: 2018-03-25 | Disposition: A | Discharge status: Home / Self Care | Payer: Medicare Other | Source: Ambulatory Visit | Attending: Family Medicine | Admitting: Family Medicine

## 2018-03-25 DIAGNOSIS — Z1231 Encounter for screening mammogram for malignant neoplasm of breast: Secondary | ICD-10-CM | POA: Diagnosis not present

## 2018-03-25 IMAGING — MG DIGITAL SCREENING BILATERAL MAMMOGRAM WITH TOMO AND CAD
8 series · 8 of 24 positions shown · non-contrast
Comparison: Previous exam(s).

ACR Breast Density Category a: The breast tissue is almost entirely
fatty.

CLINICAL DATA: Screening.

EXAM:
DIGITAL SCREENING BILATERAL MAMMOGRAM WITH TOMO AND CAD

[L CC synth-2D]
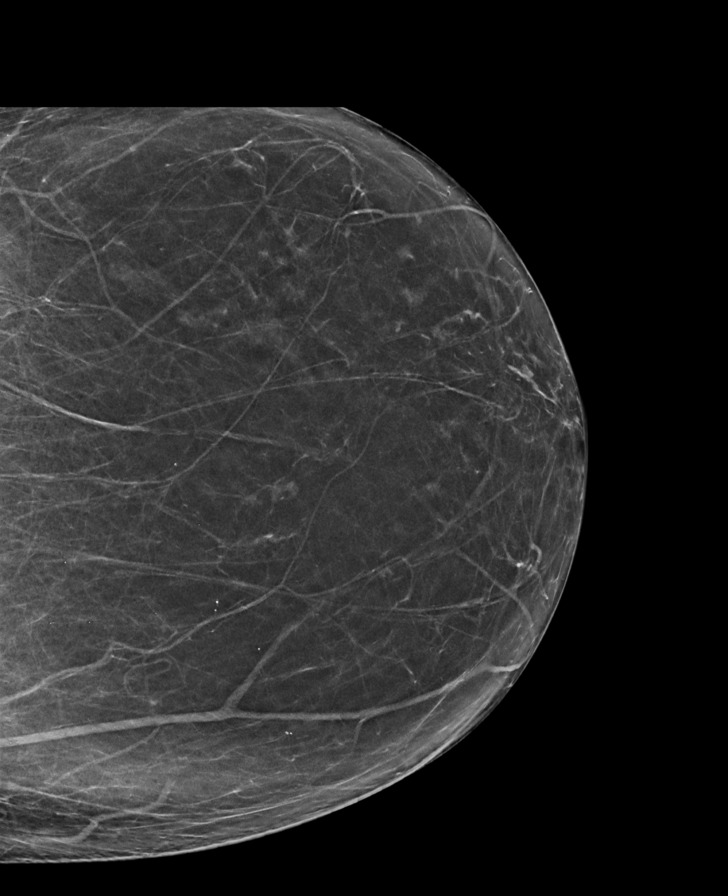

[R MLO synth-2D]
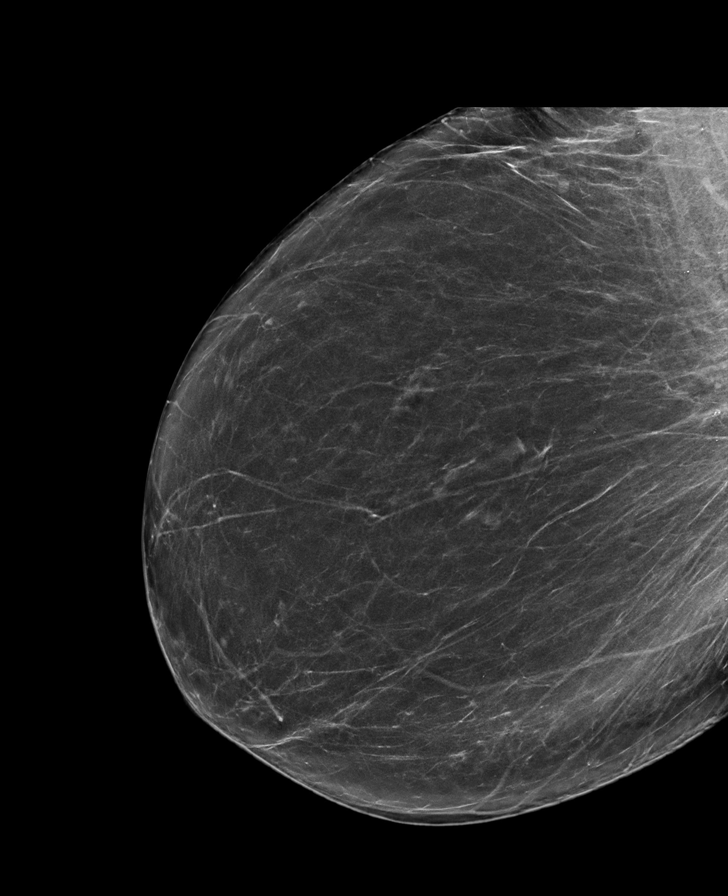

[L MLO synth-2D]
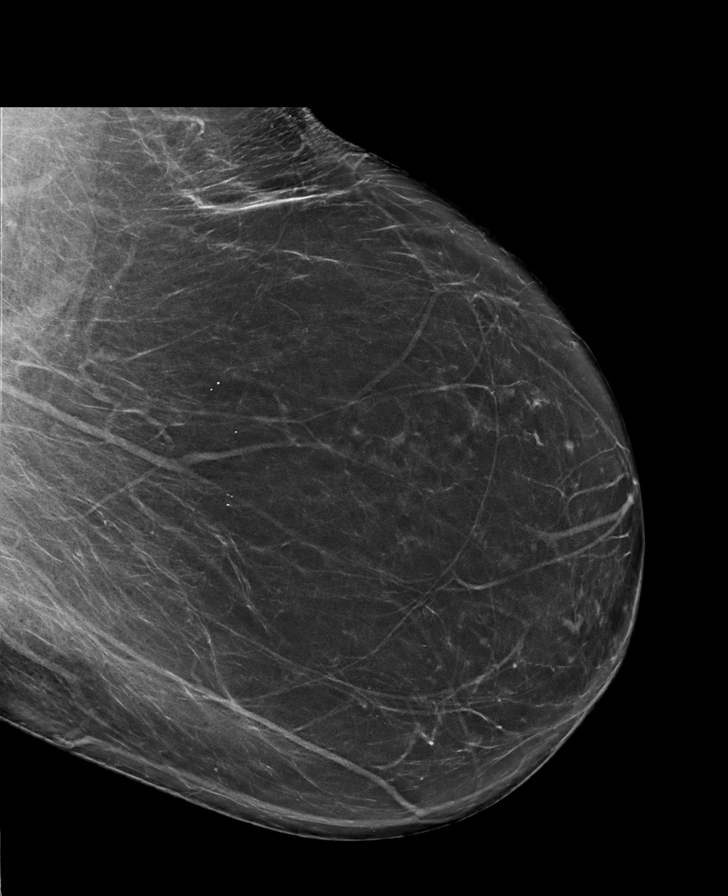

[R CC synth-2D]
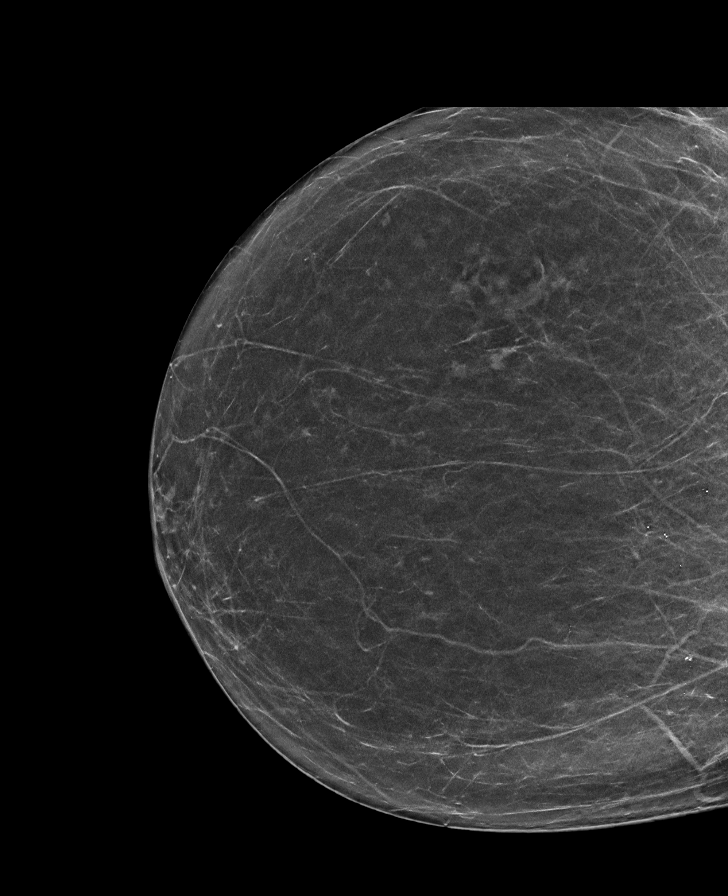

[R MLO tomo · tomo slice 39/78.0]
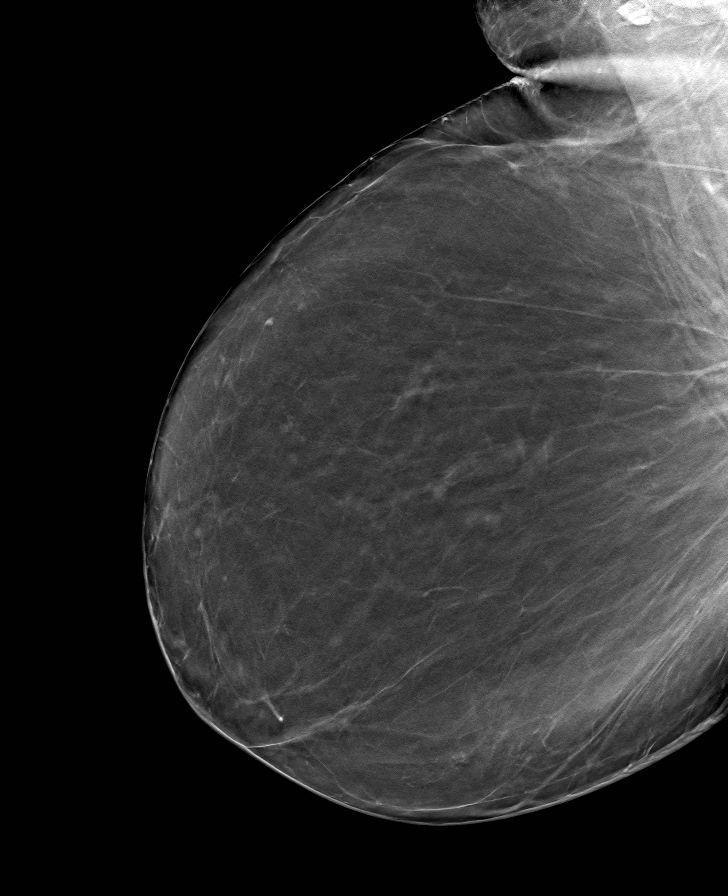

[L MLO tomo · tomo slice 43/86.0]
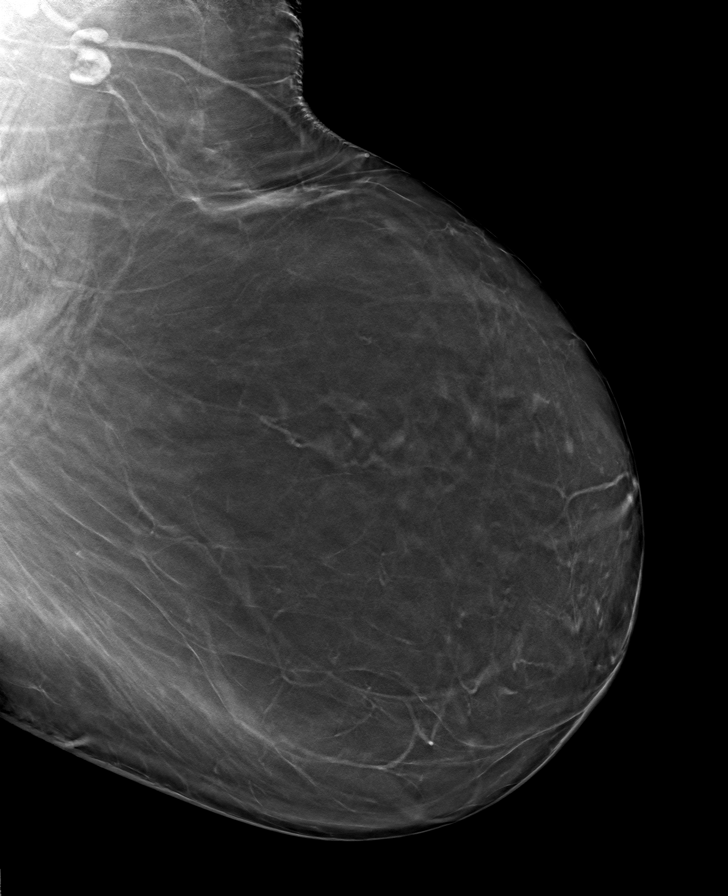

[R CC tomo · tomo slice 35/68.0]
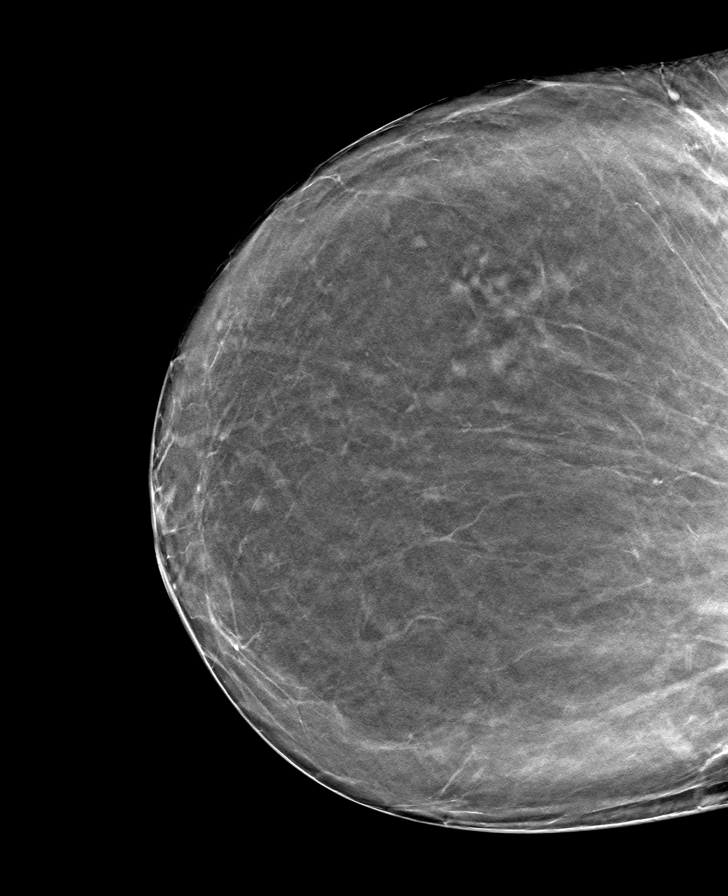

[L CC tomo · tomo slice 35/69.0]
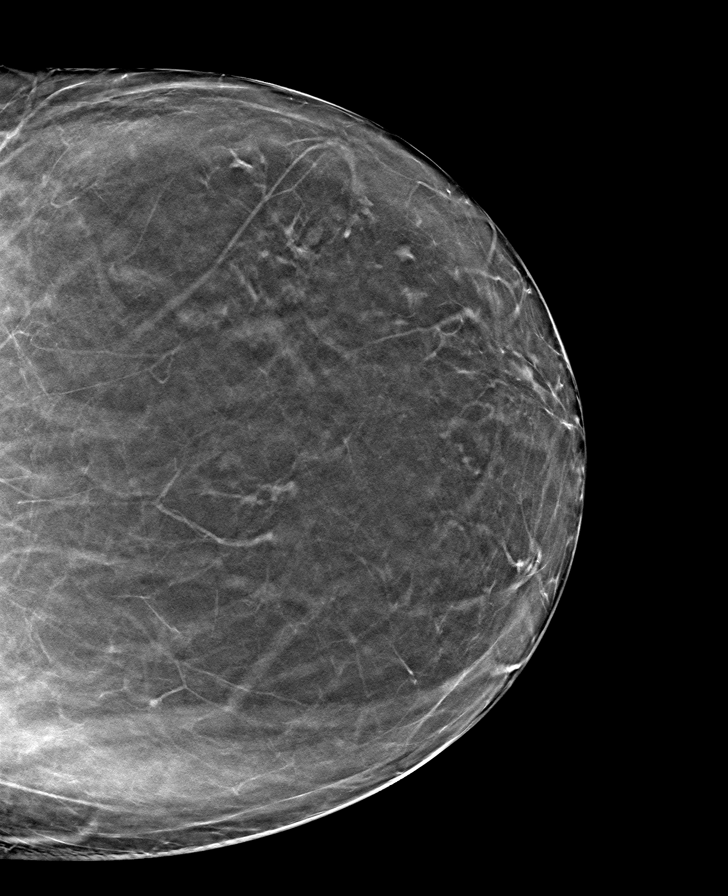

[8 of 24 positions shown; findings below may reference images not displayed]

FINDINGS: There are no findings suspicious for malignancy. Images were
processed with CAD.
IMPRESSION: No mammographic evidence of malignancy. A result letter of this
screening mammogram will be mailed directly to the patient.

RECOMMENDATION:
Screening mammogram in one year. (Code:[TA])

BI-RADS CATEGORY  1: Negative.

## 2018-03-26 ENCOUNTER — Encounter: Payer: Self-pay | Admitting: Family Medicine

## 2018-03-26 ENCOUNTER — Ambulatory Visit (INDEPENDENT_AMBULATORY_CARE_PROVIDER_SITE_OTHER): Payer: Medicare Other | Admitting: Family Medicine

## 2018-03-26 ENCOUNTER — Other Ambulatory Visit: Payer: Self-pay

## 2018-03-26 VITALS — BP 128/80 | HR 58 | Temp 98.0°F | Ht 61.0 in | Wt 184.0 lb

## 2018-03-26 DIAGNOSIS — E782 Mixed hyperlipidemia: Secondary | ICD-10-CM

## 2018-03-26 DIAGNOSIS — F329 Major depressive disorder, single episode, unspecified: Secondary | ICD-10-CM

## 2018-03-26 DIAGNOSIS — Z8249 Family history of ischemic heart disease and other diseases of the circulatory system: Secondary | ICD-10-CM

## 2018-03-26 DIAGNOSIS — E890 Postprocedural hypothyroidism: Secondary | ICD-10-CM | POA: Diagnosis not present

## 2018-03-26 DIAGNOSIS — K219 Gastro-esophageal reflux disease without esophagitis: Secondary | ICD-10-CM

## 2018-03-26 DIAGNOSIS — M8580 Other specified disorders of bone density and structure, unspecified site: Secondary | ICD-10-CM | POA: Diagnosis not present

## 2018-03-26 LAB — LIPID PANEL
CHOL/HDL RATIO: 3
Cholesterol: 212 mg/dL — ABNORMAL HIGH (ref 0–200)
HDL: 65.3 mg/dL (ref >39.00)
LDL CALC: 132 mg/dL — AB (ref 0–99)
NonHDL: 147.12
Triglycerides: 77 mg/dL (ref 0.0–149.0)
VLDL: 15.4 mg/dL (ref 0.0–40.0)

## 2018-03-26 LAB — TSH: TSH: 2.85 u[IU]/mL (ref 0.35–4.50)

## 2018-03-26 LAB — CBC WITH DIFFERENTIAL/PLATELET
BASOS PCT: 1 % (ref 0.0–3.0)
Basophils Absolute: 0.1 10*3/uL (ref 0.0–0.1)
Eosinophils Absolute: 0.2 10*3/uL (ref 0.0–0.7)
Eosinophils Relative: 2.3 % (ref 0.0–5.0)
HCT: 41.8 % (ref 36.0–46.0)
HEMOGLOBIN: 14.1 g/dL (ref 12.0–15.0)
Lymphocytes Relative: 25.4 % (ref 12.0–46.0)
Lymphs Abs: 1.7 10*3/uL (ref 0.7–4.0)
MCHC: 33.6 g/dL (ref 30.0–36.0)
MCV: 88.6 fl (ref 78.0–100.0)
Monocytes Absolute: 0.4 10*3/uL (ref 0.1–1.0)
Monocytes Relative: 6.2 % (ref 3.0–12.0)
Neutro Abs: 4.3 10*3/uL (ref 1.4–7.7)
Neutrophils Relative %: 65.1 % (ref 43.0–77.0)
PLATELETS: 253 10*3/uL (ref 150.0–400.0)
RBC: 4.72 Mil/uL (ref 3.87–5.11)
RDW: 13.7 % (ref 11.5–15.5)
WBC: 6.6 10*3/uL (ref 4.0–10.5)

## 2018-03-26 LAB — COMPREHENSIVE METABOLIC PANEL
ALBUMIN: 4.2 g/dL (ref 3.5–5.2)
ALT: 15 U/L (ref 0–35)
AST: 21 U/L (ref 0–37)
Alkaline Phosphatase: 103 U/L (ref 39–117)
BUN: 16 mg/dL (ref 6–23)
CHLORIDE: 104 meq/L (ref 96–112)
CO2: 27 meq/L (ref 19–32)
CREATININE: 0.94 mg/dL (ref 0.40–1.20)
Calcium: 9.7 mg/dL (ref 8.4–10.5)
GFR: 61.42 mL/min (ref >60.00)
Glucose, Bld: 88 mg/dL (ref 70–99)
Potassium: 4.3 mEq/L (ref 3.5–5.1)
SODIUM: 140 meq/L (ref 135–145)
Total Bilirubin: 0.6 mg/dL (ref 0.2–1.2)
Total Protein: 7.1 g/dL (ref 6.0–8.3)

## 2018-03-26 NOTE — Progress Notes (Signed)
Subjective  Chief Complaint  Patient presents with  . Annual Exam    doing well, patient is fasting  . Ear Pain    left ear pain, itching in ear     HPI: Martha Gilmore is a 77 y.o. female who presents to [place] [facility] today for a Female Wellness Visit. She also has the concerns and/or needs as listed above in the chief complaint. These will be addressed in addition to the Health Maintenance Visit.   Wellness Visit: annual visit with health maintenance review and exam without Pap   Martha Gilmore is doing okay.  Continues to grieve after the loss of her husband earlier this year.  Reports her mood is stable.  Dealing with the transition without him through the holidays.  Good support systems with her children.  Health maintenance: Bone density scheduled for tomorrow.  Mammogram up-to-date.  Colorectal cancer screening up-to-date.  Immunizations up-to-date.  She is waiting on Shingrix which remains on back order. Wt Readings from Last 3 Encounters:  03/26/18 184 lb (83.5 kg)  02/28/18 184 lb 2 oz (83.5 kg)  11/05/17 183 lb 9.6 oz (83.3 kg)   Depression screen [facility] 2/9 02/28/2018 11/05/2017 04/16/2017  Decreased Interest 0 0 0  Down, Depressed, Hopeless 0 0 0  PHQ - 2 Score 0 0 0  Altered sleeping 0 0 -  Tired, decreased energy 0 0 -  Change in appetite 3 0 -  Feeling bad or failure about yourself  0 0 -  Trouble concentrating 0 0 -  Moving slowly or fidgety/restless 0 0 -  Suicidal thoughts 0 0 -  PHQ-9 Score 3 0 -  Difficult doing work/chores Not difficult at all Not difficult at all -    Chronic disease f/u and/or acute problem visit: (deemed necessary to be done in addition to the wellness visit):  Follow-up hypothyroidism and hyperlipidemia: Taking medications, compliant.  Energy levels are good.  No symptoms or signs of low thyroid.  Denies mood problems.  Tolerating statin well.  No chest pain.  No active GERD.  No melena.  Assessment  1.  Postablative hypothyroidism   2. Major depression, chronic   3. Mixed hyperlipidemia   4. Family history of early CAD   76. Gastroesophageal reflux disease without esophagitis      Plan  Female Wellness Visit:  Age appropriate Health Maintenance and Prevention measures were discussed with patient. Included topics are cancer screening recommendations, ways to keep healthy (see AVS) including dietary and exercise recommendations, regular eye and dental care, use of seat belts, and avoidance of moderate alcohol use and tobacco use.  Bone density tomorrow.  BMI: discussed patient's BMI and encouraged positive lifestyle modifications to help get to or maintain a target BMI.  HM needs and immunizations were addressed and ordered. See below for orders. See HM and immunization section for updates.  Routine labs and screening tests ordered including cmp, cbc and lipids where appropriate.  Discussed recommendations regarding Vit D and calcium supplementation (see AVS)  Chronic disease management visit and/or acute problem visit:  Check lab for lipid and thyroid.  Refill medications if stable.  Counseled on healthy lifestyle recommendations.  Appropriate grief counseling done.  Shingrix when able. Follow up: No follow-ups on file.  Orders Placed This Encounter  Procedures  . CBC with Differential/Platelet  . Comprehensive metabolic panel  . Lipid panel  . TSH   No orders of the defined types were placed in this encounter.  Lifestyle: Body mass index is 34.77 kg/m. Wt Readings from Last 3 Encounters:  03/26/18 184 lb (83.5 kg)  02/28/18 184 lb 2 oz (83.5 kg)  11/05/17 183 lb 9.6 oz (83.3 kg)   Diet: general, discussed trying to improve this Exercise: never, aerobics  Patient Active Problem List   Diagnosis Date Noted  . Postablative hypothyroidism 12/20/2015    Priority: High  . Major depression, chronic 04/16/2017    Priority: Medium  . Mixed hyperlipidemia 04/16/2017     Priority: Medium  . History of Graves' disease 03/22/2015    Priority: Medium    Overview:  Treated with RAI-131 01/2015 Dr Steffanie Dunn   . GERD (gastroesophageal reflux disease) 12/13/2012    Priority: Medium    EGD 09/2016 - Dr. Benson Norway, normal   . Osteopenia 08/05/2012    Priority: Medium    DEXA 01/2017: T = -1.5, osteopenia; rec calcium and vit D and exercise. Recheck 2-3 years. Bone density scan April 2014-mild osteopenia, T equals -1.1   . DDD (degenerative disc disease), lumbosacral 10/22/2010    Priority: Medium  . Bilateral sensorineural hearing loss 02/06/2016    Priority: Low    Overview:  [facility] review 2017   . OAB (overactive bladder) 12/16/2013    Priority: Low  . Osteoarthritis of [place] joint of thumb 08/13/2012    Priority: Low  . Seasonal allergies 08/13/2012    Priority: Low  . Urge and stress incontinence 08/13/2012    Priority: Low  . Chondromalacia, knee, right 11/05/2017  . Osteoarthrosis, localized, primary, knee, right 11/05/2017  . Clostridium difficile infection 04/16/2017    H/o C.diff due to clindamicyin 09/2016; two rounds of treatment for cure. Dr. Benson Norway   . Family history of early CAD 04/16/2017    Treadmill test 04/2016 - equivocal: didn't reach maximal HR; otherwise negative.     Health Maintenance  Topic Date Due  . DEXA SCAN  02/11/2017  . MAMMOGRAM  03/26/2019  . TETANUS/TDAP  08/16/2020  . INFLUENZA VACCINE  Completed  . PNA vac Low Risk Adult  Completed   Immunization History  Administered Date(s) Administered  . Influenza Split 01/15/2012  . Influenza, High Dose Seasonal PF 01/30/2013, 01/05/2015, 02/17/2016, 02/02/2017  . Influenza, Seasonal, Injecte, Preservative Fre 01/21/2014  . Influenza-Unspecified 01/09/2011, 01/15/2012, 01/21/2014, 01/22/2018  . Pneumococcal Conjugate-13 01/05/2015  . Pneumococcal Polysaccharide-23 04/24/2006, 11/18/2007, 05/30/2012  . Tdap 08/17/2010  . Zoster 04/25/1995   We updated and  reviewed the patient's past history in detail and it is documented below. Allergies: Patient is allergic to aspirin; meperidine; penicillins; statins; cephalexin; clindamycin/lincomycin; demerol  [meperidine hcl]; other; and tape. Past Medical History Patient  has a past medical history of Cataracts, bilateral (09/23/2015), Clostridium difficile infection (04/16/2017), Depression, Hyperlipidemia, Hypertension, OAB (overactive bladder) (12/16/2013), Osteopenia (08/05/2012), Seasonal allergies, and Urge and stress incontinence. Past Surgical History Patient  has a past surgical history that includes Cholecystectomy; Cesarean section; Teeth Implants; Appendectomy (1971); Trigger finger release; and Vaginal hysterectomy. Family History: Patient family history includes Brain cancer in her brother; Cancer in her father; Diabetes in her sister; Heart disease in her father, maternal grandmother, mother, and sister; Kidney cancer in her sister; Lung cancer in her sister. Social History:  Patient  reports that she has never smoked. She has never used smokeless tobacco. She reports that she does not drink alcohol or use drugs.  Review of Systems: Constitutional: negative for fever or malaise Ophthalmic: negative for photophobia, double vision or loss of vision Cardiovascular: negative for  chest pain, dyspnea on exertion, or new LE swelling Respiratory: negative for SOB or persistent cough Gastrointestinal: negative for abdominal pain, change in bowel habits or melena Genitourinary: negative for dysuria or gross hematuria, no abnormal uterine bleeding or disharge Musculoskeletal: negative for new gait disturbance or muscular weakness Integumentary: negative for new or persistent rashes, no breast lumps Neurological: negative for TIA or stroke symptoms Psychiatric: negative for SI or delusions Allergic/Immunologic: negative for hives  Patient Care Team    Relationship Specialty Notifications Start End    Leamon Arnt, MD PCP - General Family Medicine  03/21/16   Susette Racer, MD Consulting Physician Endocrinology  04/16/17   Carol Ada, MD Consulting Physician Gastroenterology  04/16/17   Roseanne Kaufman, MD Consulting Physician Orthopedic Surgery  02/28/18   Rolm Bookbinder, MD Consulting Physician Dermatology  02/28/18     Objective  Vitals: BP 128/80   Pulse (!) 58   Temp 98 F (36.7 C)   Ht 5\' 1"  (1.549 m)   Wt 184 lb (83.5 kg)   SpO2 98%   BMI 34.77 kg/m  General:  Well developed, well nourished, no acute distress  Psych:  Alert and orientedx3,normal mood and affect HEENT:  Normocephalic, atraumatic, non-icteric sclera, PERRL, oropharynx is clear without mass or exudate, supple neck without adenopathy, mass or thyromegaly Cardiovascular:  Normal S1, S2, RRR without gallop, rub or murmur, nondisplaced PMI Respiratory:  Good breath sounds bilaterally, CTAB with normal respiratory effort Gastrointestinal: normal bowel sounds, soft, non-tender, no noted masses. No HSM MSK: no deformities, contusions. Joints are without erythema or swelling. Spine and CVA region are nontender Skin:  Warm, no rashes or suspicious lesions noted Neurologic:    Mental status is normal. CN 2-11 are normal. Gross motor and sensory exams are normal. Normal gait. No tremor Breast Exam: No mass, skin retraction or nipple discharge is appreciated in either breast. No axillary adenopathy. Fibrocystic changes are not noted    Commons side effects, risks, benefits, and alternatives for medications and treatment plan prescribed today were discussed, and the patient expressed understanding of the given instructions. Patient is instructed to call or message via MyChart if he/she has any questions or concerns regarding our treatment plan. No barriers to understanding were identified. We discussed Red Flag symptoms and signs in detail. Patient expressed understanding regarding what to do in case of urgent or  emergency type symptoms.   Medication list was reconciled, printed and provided to the patient in AVS. Patient instructions and summary information was reviewed with the patient as documented in the AVS. This note was prepared with assistance of Dragon voice recognition software. Occasional wrong-word or sound-a-like substitutions may have occurred due to the inherent limitations of voice recognition software

## 2018-03-26 NOTE — Patient Instructions (Signed)
Please return in 12 months for your annual complete physical; please come fasting.  AWV due in November.   I will release your lab results to you on your MyChart account with further instructions. Please reply with any questions.    If you have any questions or concerns, please don't hesitate to send me a message via MyChart or call the office at [phone]. Thank you for visiting with [place] today! It's our pleasure caring for you.  Please do these things to maintain good health!   Exercise at least 30-45 minutes a day,  4-5 days a week.   Eat a low-fat diet with lots of fruits and vegetables, up to 7-9 servings per day.  Drink plenty of water daily. Try to drink 8 8oz glasses per day.  Seatbelts can save your life. Always wear your seatbelt.  Place Smoke Detectors on every level of your home and check batteries every year.  Schedule an appointment with an eye doctor for an eye exam every 1-2 years  Safe sex - use condoms to protect yourself from STDs if you could be exposed to these types of infections. Use birth control if you do not want to become pregnant and are sexually active.  Avoid heavy alcohol use. If you drink, keep it to less than 2 drinks/day and not every day.  [place].  Choose someone you trust that could speak for you if you became unable to speak for yourself.  Depression is common in our stressful world.If you're feeling down or losing interest in things you normally enjoy, please come in for a visit.  If anyone is threatening or hurting you, please get help. Physical or Emotional Violence is never OK.

## 2018-03-27 ENCOUNTER — Other Ambulatory Visit: Payer: Medicare Other

## 2018-03-28 DIAGNOSIS — E890 Postprocedural hypothyroidism: Secondary | ICD-10-CM | POA: Diagnosis not present

## 2018-05-13 ENCOUNTER — Ambulatory Visit
Admission: RE | Admit: 2018-05-13 | Discharge: 2018-05-13 | Disposition: A | Discharge status: Home / Self Care | Payer: Medicare Other | Source: Ambulatory Visit | Attending: Family Medicine | Admitting: Family Medicine

## 2018-05-13 DIAGNOSIS — E2839 Other primary ovarian failure: Secondary | ICD-10-CM

## 2018-05-13 DIAGNOSIS — Z780 Asymptomatic menopausal state: Secondary | ICD-10-CM | POA: Diagnosis not present

## 2018-05-13 DIAGNOSIS — M8589 Other specified disorders of bone density and structure, multiple sites: Secondary | ICD-10-CM | POA: Diagnosis not present

## 2018-05-31 ENCOUNTER — Encounter: Payer: Self-pay | Admitting: Family Medicine

## 2018-05-31 MED ORDER — CITALOPRAM HYDROBROMIDE 20 MG PO TABS: 20.0000 mg | ORAL_TABLET | Freq: Every day | ORAL | 3 refills | Status: DC

## 2018-05-31 MED ORDER — LEVOTHYROXINE SODIUM 100 MCG PO CAPS: 1.0000 | ORAL_CAPSULE | Freq: Every day | ORAL | 3 refills | Status: DC

## 2018-05-31 MED ORDER — ATORVASTATIN CALCIUM 10 MG PO TABS: 10.0000 mg | ORAL_TABLET | Freq: Every day | ORAL | 3 refills | Status: DC

## 2018-05-31 MED ORDER — OMEPRAZOLE 20 MG PO CPDR: 20.0000 mg | DELAYED_RELEASE_CAPSULE | Freq: Every day | ORAL | 3 refills | Status: DC

## 2018-06-05 DIAGNOSIS — Z85828 Personal history of other malignant neoplasm of skin: Secondary | ICD-10-CM | POA: Diagnosis not present

## 2018-06-05 DIAGNOSIS — L814 Other melanin hyperpigmentation: Secondary | ICD-10-CM | POA: Diagnosis not present

## 2018-06-05 DIAGNOSIS — L820 Inflamed seborrheic keratosis: Secondary | ICD-10-CM | POA: Diagnosis not present

## 2018-06-05 DIAGNOSIS — L821 Other seborrheic keratosis: Secondary | ICD-10-CM | POA: Diagnosis not present

## 2018-06-05 DIAGNOSIS — D2271 Melanocytic nevi of right lower limb, including hip: Secondary | ICD-10-CM | POA: Diagnosis not present

## 2018-06-05 DIAGNOSIS — D225 Melanocytic nevi of trunk: Secondary | ICD-10-CM | POA: Diagnosis not present

## 2018-06-05 DIAGNOSIS — D2272 Melanocytic nevi of left lower limb, including hip: Secondary | ICD-10-CM | POA: Diagnosis not present

## 2018-07-22 ENCOUNTER — Telehealth: Payer: Self-pay | Admitting: *Deleted

## 2018-07-22 NOTE — Telephone Encounter (Signed)
Oxybutynin ER 10mg  Approved Dates: 3/224/20 - 04/24/19 Ticket #: [id number]

## 2018-09-04 DIAGNOSIS — H353131 Nonexudative age-related macular degeneration, bilateral, early dry stage: Secondary | ICD-10-CM | POA: Diagnosis not present

## 2018-09-27 DIAGNOSIS — E890 Postprocedural hypothyroidism: Secondary | ICD-10-CM | POA: Diagnosis not present

## 2018-10-04 DIAGNOSIS — E890 Postprocedural hypothyroidism: Secondary | ICD-10-CM | POA: Diagnosis not present

## 2018-10-14 ENCOUNTER — Encounter: Payer: Self-pay | Admitting: Family Medicine

## 2018-10-16 ENCOUNTER — Encounter: Payer: Self-pay | Admitting: *Deleted

## 2018-12-11 DIAGNOSIS — Z23 Encounter for immunization: Secondary | ICD-10-CM | POA: Diagnosis not present

## 2019-02-10 ENCOUNTER — Other Ambulatory Visit: Payer: Self-pay | Admitting: Family Medicine

## 2019-02-12 ENCOUNTER — Other Ambulatory Visit: Payer: Self-pay | Admitting: Family Medicine

## 2019-02-12 DIAGNOSIS — Z1231 Encounter for screening mammogram for malignant neoplasm of breast: Secondary | ICD-10-CM

## 2019-03-05 ENCOUNTER — Encounter: Payer: Self-pay | Admitting: Family Medicine

## 2019-03-05 NOTE — Telephone Encounter (Signed)
Please call pt and schedule an appt

## 2019-03-06 ENCOUNTER — Ambulatory Visit: Payer: Medicare Other

## 2019-03-07 ENCOUNTER — Encounter: Payer: Self-pay | Admitting: Family Medicine

## 2019-03-07 ENCOUNTER — Other Ambulatory Visit: Payer: Self-pay

## 2019-03-07 ENCOUNTER — Ambulatory Visit (INDEPENDENT_AMBULATORY_CARE_PROVIDER_SITE_OTHER): Payer: Medicare Other | Admitting: Family Medicine

## 2019-03-07 VITALS — BP 112/80 | HR 57 | Temp 97.6°F | Ht 61.0 in | Wt 168.0 lb

## 2019-03-07 DIAGNOSIS — H60393 Other infective otitis externa, bilateral: Secondary | ICD-10-CM

## 2019-03-07 MED ORDER — ACETIC ACID 2 % OT SOLN: 4.0000 [drp] | Freq: Three times a day (TID) | OTIC | 0 refills | Status: DC

## 2019-03-07 NOTE — Progress Notes (Signed)
Subjective  CC:  Chief Complaint  Patient presents with  . itching in b/l ears    denies any pain in b/l ears    HPI: Martha Gilmore is a 78 y.o. female who presents to the office today to address the problems listed above in the chief complaint.  Wears hearing aides: x months has itching while wearing them. So bad now that she can't wear them. Audiology eval was unremarkable. Denies pain, discharge but EAC will be sore if uses a qtip after wearing them.   HM: due for cpe and awv in dec 2020 ROS: feels well  Assessment  1. Other infective chronic otitis externa of both ears      Plan   Possible fungal otitis externa vs other:  Inferior ear canal is abnormal. Trial of vosol solution for fungal infection. If not improved, refer to ent.   F/u for cpe and awv.   No orders of the defined types were placed in this encounter.  Meds ordered this encounter  Medications  . acetic acid 2 % otic solution    Sig: Place 4 drops into both ears 3 (three) times daily.    Dispense:  15 mL    Refill:  0      I reviewed the patients updated PMH, FH, and SocHx.    Patient Active Problem List   Diagnosis Date Noted  . Postablative hypothyroidism 12/20/2015    Priority: High  . Major depression, chronic 04/16/2017    Priority: Medium  . Mixed hyperlipidemia 04/16/2017    Priority: Medium  . History of Graves' disease 03/22/2015    Priority: Medium  . GERD (gastroesophageal reflux disease) 12/13/2012    Priority: Medium  . Osteopenia 08/05/2012    Priority: Medium  . DDD (degenerative disc disease), lumbosacral 10/22/2010    Priority: Medium  . Bilateral sensorineural hearing loss 02/06/2016    Priority: Low  . OAB (overactive bladder) 12/16/2013    Priority: Low  . Osteoarthritis of Index joint of thumb 08/13/2012    Priority: Low  . Seasonal allergies 08/13/2012    Priority: Low  . Urge and stress incontinence 08/13/2012    Priority: Low  . Chondromalacia, knee, right  11/05/2017  . Osteoarthrosis, localized, primary, knee, right 11/05/2017  . Clostridium difficile infection 04/16/2017  . Family history of early CAD 04/16/2017   Current Meds  Medication Sig  . atorvastatin (LIPITOR) 10 MG tablet Take 1 tablet (10 mg total) by mouth at bedtime.  . citalopram (CELEXA) 20 MG tablet Take 1 tablet (20 mg total) by mouth daily.  . Levothyroxine Sodium 100 MCG CAPS Take 1 capsule (100 mcg total) by mouth daily.  . Multiple Vitamins-Minerals (PRESERVISION AREDS 2 PO) Take by mouth.  Marland Kitchen omeprazole (PRILOSEC) 20 MG capsule Take 1 capsule (20 mg total) by mouth daily.  Marland Kitchen oxybutynin (DITROPAN-XL) 10 MG 24 hr tablet TAKE ONE TABLET BY MOUTH DAILY AT BEDTIME   . Polyethyl Glycol-Propyl Glycol (SYSTANE OP) Apply 1 drop to eye as needed.    Allergies: Patient is allergic to aspirin; meperidine; penicillins; statins; cephalexin; clindamycin/lincomycin; demerol  [meperidine hcl]; other; and tape. Family History: Patient family history includes Brain cancer in her brother; Cancer in her father; Diabetes in her sister; Heart disease in her father, maternal grandmother, mother, and sister; Kidney cancer in her sister; Lung cancer in her sister. Social History:  Patient  reports that she has never smoked. She has never used smokeless tobacco. She reports that she does  not drink alcohol or use drugs.  Review of Systems: Constitutional: Negative for fever malaise or anorexia Cardiovascular: negative for chest pain Respiratory: negative for SOB or persistent cough Gastrointestinal: negative for abdominal pain  Objective  Vitals: BP 112/80 (BP Location: Left Arm, Patient Position: Sitting, Cuff Size: Normal)   Pulse (!) 57   Temp 97.6 F (36.4 C) (Skin)   Ht 5\' 1"  (1.549 m)   Wt 168 lb (76.2 kg)   SpO2 97%   BMI 31.74 kg/m  General: no acute distress , A&Ox3 HEENT: PEERL, conjunctiva normal, Oropharynx moist,neck is supple Inferior EAC bilaterally with white plaques,  nl TMs, no erythema or exudate.      Commons side effects, risks, benefits, and alternatives for medications and treatment plan prescribed today were discussed, and the patient expressed understanding of the given instructions. Patient is instructed to call or message via MyChart if he/she has any questions or concerns regarding our treatment plan. No barriers to understanding were identified. We discussed Red Flag symptoms and signs in detail. Patient expressed understanding regarding what to do in case of urgent or emergency type symptoms.   Medication list was reconciled, printed and provided to the patient in AVS. Patient instructions and summary information was reviewed with the patient as documented in the AVS. This note was prepared with assistance of Dragon voice recognition software. Occasional wrong-word or sound-a-like substitutions may have occurred due to the inherent limitations of voice recognition software

## 2019-03-07 NOTE — Patient Instructions (Signed)
Please return in 1-3 months for your annual complete physical; please come fasting. Medicare recommends an Annual Wellness Visit for all patients. Please schedule this to be done with our Nurse Educator, Courtney. This is an informative "talk" visit; it's goals are to ensure that your health care needs are being met and to give you education regarding avoiding falls, ensuring you are not suffering from depression or problems with memory or thinking, and to educate you on Advance Care Planning. It helps me take good care of you!  Use the drops as prescribed but if itching persists, let me know and I will refer you to the ENT specialist.   If you have any questions or concerns, please don't hesitate to send me a message via MyChart or call the office at [phone]. Thank you for visiting with us today! It's our pleasure caring for you.   

## 2019-03-12 DIAGNOSIS — Z961 Presence of intraocular lens: Secondary | ICD-10-CM | POA: Diagnosis not present

## 2019-03-12 DIAGNOSIS — H353131 Nonexudative age-related macular degeneration, bilateral, early dry stage: Secondary | ICD-10-CM | POA: Diagnosis not present

## 2019-03-12 DIAGNOSIS — H04123 Dry eye syndrome of bilateral lacrimal glands: Secondary | ICD-10-CM | POA: Diagnosis not present

## 2019-03-12 DIAGNOSIS — H43813 Vitreous degeneration, bilateral: Secondary | ICD-10-CM | POA: Diagnosis not present

## 2019-03-14 ENCOUNTER — Other Ambulatory Visit: Payer: Self-pay

## 2019-03-17 ENCOUNTER — Other Ambulatory Visit: Payer: Self-pay

## 2019-03-17 ENCOUNTER — Ambulatory Visit (INDEPENDENT_AMBULATORY_CARE_PROVIDER_SITE_OTHER): Payer: Medicare Other

## 2019-03-17 VITALS — BP 130/68 | Temp 97.6°F | Ht 61.0 in | Wt 169.0 lb

## 2019-03-17 DIAGNOSIS — Z0000 Encounter for general adult medical examination without abnormal findings: Secondary | ICD-10-CM

## 2019-03-17 NOTE — Progress Notes (Signed)
Subjective:   Martha Gilmore is a 78 y.o. female who presents for Medicare Annual (Subsequent) preventive examination.  Review of Systems:   Cardiac Risk Factors include: advanced age (>76men, >2 women);dyslipidemia    Objective:     Vitals: BP 130/68 (BP Location: Left Arm, Patient Position: Sitting, Cuff Size: Normal)   Temp 97.6 F (36.4 C) (Temporal)   Ht 5\' 1"  (1.549 m)   Wt 169 lb (76.7 kg)   SpO2 97%   BMI 31.93 kg/m   Body mass index is 31.93 kg/m.  Advanced Directives 03/17/2019 02/28/2018  Does Patient Have a Medical Advance Directive? Yes Yes  Type of Paramedic of [place];Living will Living will;Healthcare Power of [place] in Chart? No - copy requested No - copy requested  Would patient like information on creating a medical advance directive? No - Patient declined -    Tobacco Social History   Tobacco Use  Smoking Status Never Smoker  Smokeless Tobacco Never Used     Counseling given: Not Answered   Clinical Intake:  Pre-visit preparation completed: Yes  Pain : No/denies pain  Diabetes: No  How often do you need to have someone help you when you read instructions, pamphlets, or other written materials from your doctor or pharmacy?: 1 - Never  Interpreter Needed?: No  Information entered by :: Coralie Common  Past Medical History:  Diagnosis Date  . Cataracts, bilateral 09/23/2015  . Clostridium difficile infection 04/16/2017   H/o C.diff due to clindamicyin 09/2016; two rounds of treatment for cure. Dr. Benson [place]  . Depression   . Hyperlipidemia   . Hypertension   . OAB (overactive bladder) 12/16/2013  . Osteopenia 08/05/2012   Dexa scan 07/2012 mild osteopenia, T = -1.1  . Seasonal allergies   . Urge and stress incontinence    Past Surgical History:  Procedure Laterality Date  . APPENDECTOMY  1971  . CESAREAN SECTION    . CHOLECYSTECTOMY    . Teeth Implants    .  TRIGGER FINGER RELEASE    . VAGINAL HYSTERECTOMY     Family History  Problem Relation Age of Onset  . Heart disease Mother   . Cancer Father   . Heart disease Father   . Lung cancer Sister   . Heart disease Sister   . Diabetes Sister   . Brain cancer Brother   . Heart disease Maternal Grandmother   . Kidney cancer Sister    Social History   Socioeconomic History  . Marital status: Widowed    Spouse name: Not on file  . Number of children: Not on file  . Years of education: Not on file  . Highest education level: Not on file  Occupational History  . Not on file  Social Needs  . Financial resource strain: Not on file  . Food insecurity    Worry: Not on file    Inability: Not on file  . Transportation needs    Medical: Not on file    Non-medical: Not on file  Tobacco Use  . Smoking status: Never Smoker  . Smokeless tobacco: Never Used  Substance and Sexual Activity  . Alcohol use: No    Frequency: Never  . Drug use: No  . Sexual activity: Never    Partners: Male  Lifestyle  . Physical activity    Days per week: Not on file    Minutes per session: Not on file  .  Stress: Not on file  Relationships  . Social Herbalist on phone: Not on file    Gets together: Not on file    Attends religious service: Not on file    Active member of club or organization: Not on file    Attends meetings of clubs or organizations: Not on file    Relationship status: Not on file  Other Topics Concern  . Not on file  Social History Narrative  . Not on file    Outpatient Encounter Medications as of 03/17/2019  Medication Sig  . acetic acid 2 % otic solution Place 4 drops into both ears 3 (three) times daily.  Marland Kitchen atorvastatin (LIPITOR) 10 MG tablet Take 1 tablet (10 mg total) by mouth at bedtime.  . citalopram (CELEXA) 20 MG tablet Take 1 tablet (20 mg total) by mouth daily.  . Levothyroxine Sodium 100 MCG CAPS Take 1 capsule (100 mcg total) by mouth daily.  . Multiple  Vitamins-Minerals (PRESERVISION AREDS 2 PO) Take by mouth.  Marland Kitchen omeprazole (PRILOSEC) 20 MG capsule Take 1 capsule (20 mg total) by mouth daily.  Marland Kitchen oxybutynin (DITROPAN-XL) 10 MG 24 hr tablet TAKE ONE TABLET BY MOUTH DAILY AT BEDTIME   . Polyethyl Glycol-Propyl Glycol (SYSTANE OP) Apply 1 drop to eye as needed.   No facility-administered encounter medications on file as of 03/17/2019.     Activities of Daily Living In your present state of health, do you have any difficulty performing the following activities: 03/17/2019  Hearing? Y  Comment bilateral hearing aids  Vision? N  Difficulty concentrating or making decisions? N  Walking or climbing stairs? N  Dressing or bathing? N  Doing errands, shopping? N  Preparing Food and eating ? N  Using the Toilet? N  In the past six months, have you accidently leaked urine? N  Do you have problems with loss of bowel control? N  Managing your Medications? N  Managing your Finances? N  Housekeeping or managing your Housekeeping? N  Some recent data might be hidden    Patient Care Team: Leamon Arnt, MD as PCP - General (Family Medicine) Susette Racer, MD as Consulting Physician (Endocrinology) Carol Ada, MD as Consulting Physician (Gastroenterology) Roseanne Kaufman, MD as Consulting Physician (Orthopedic Surgery) Rolm Bookbinder, MD as Consulting Physician (Dermatology) Stephannie Li, OD as Consulting Physician (Ophthalmology) [facility] as Consulting Physician (Audiology)    Assessment:   This is a routine wellness examination for Martha Gilmore.  Exercise Activities and Dietary recommendations Current Exercise Habits: The patient does not participate in regular exercise at present  Goals    . Increase physical activity     Increase activity       Fall Risk Fall Risk  03/17/2019 02/28/2018 04/16/2017  Falls in the past year? 0 0 Yes  Number falls in past yr: - - 1  Injury with Fall? 0 - No  Risk for  fall due to : - - Impaired balance/gait  Follow up Falls evaluation completed;Education provided;Falls prevention discussed - -   Is the patient's home free of loose throw rugs in walkways, pet beds, electrical cords, etc?   yes      Grab bars in the bathroom? yes      Handrails on the stairs?   yes      Adequate lighting?   yes  Timed Get Up and Go performed: completed and within normal timeframe; no gait abnormalities noted   Depression Screen PHQ  2/9 Scores 03/17/2019 02/28/2018 11/05/2017 04/16/2017  PHQ - 2 Score 0 0 0 0  PHQ- 9 Score - 3 0 -     Cognitive Function- no cognitive concerns at this time Cognitive Testing  Alert? Yes         Normal Appearance? Yes  Oriented to person? Yes           Place? Yes  Time? Yes  Recall of three objects? Yes  Can perform simple calculations? Yes  Displays appropriate judgment? Yes  Can read the correct time from a watch face? Yes   MMSE - Mini Mental State Exam 02/28/2018  Orientation to time 5  Orientation to Place 5  Registration 3  Attention/ Calculation 5  Recall 2  Language- name 2 objects 2  Language- repeat 1  Language- follow 3 step command 3  Language- read & follow direction 1  Write a sentence 1  Copy design 1  Total score 29        Immunization History  Administered Date(s) Administered  . Influenza Split 01/15/2012  . Influenza, High Dose Seasonal PF 01/30/2013, 01/05/2015, 02/17/2016, 02/02/2017, 01/18/2018, 12/11/2018  . Influenza, Seasonal, Injecte, Preservative Fre 01/21/2014  . Influenza-Unspecified 01/09/2011, 01/15/2012, 01/21/2014, 01/22/2018  . Pneumococcal Conjugate-13 01/05/2015  . Pneumococcal Polysaccharide-23 04/24/2006, 11/18/2007, 05/30/2012  . Tdap 08/17/2010  . Zoster 04/25/1995  . Zoster Recombinat (Shingrix) 06/29/2018, 11/01/2018    Qualifies for Shingles Vaccine? Shingrix completed   Screening Tests Health Maintenance  Topic Date Due  . MAMMOGRAM  03/26/2019  . DEXA SCAN   05/13/2020  . TETANUS/TDAP  08/16/2020  . INFLUENZA VACCINE  Completed  . PNA vac Low Risk Adult  Completed    Cancer Screenings: Lung: Low Dose CT Chest recommended if Age 97-80 years, 30 pack-year currently smoking OR have quit w/in 15years. Patient does not qualify. Breast:  Up to date on Mammogram? Yes; scheduled for 03/2019 Up to date of Bone Density/Dexa? Yes Colorectal: colonoscopy 04/05/15    Plan:  I have personally reviewed and addressed the Medicare Annual Wellness questionnaire and have noted the following in the patient's chart:  A. Medical and social history B. Use of alcohol, tobacco or illicit drugs  C. Current medications and supplements D. Functional ability and status E.  Nutritional status F.  Physical activity G. Advance directives H. List of other physicians I.  Hospitalizations, surgeries, and ER visits in previous 12 months J.  [place] such as hearing and vision if needed, cognitive and depression L. Referrals, records requested, and appointments- none   In addition, I have reviewed and discussed with patient certain preventive protocols, quality metrics, and best practice recommendations. A written personalized care plan for preventive services as well as general preventive health recommendations were provided to patient.   Signed,  Denman George, LPN  Nurse Health Advisor   Nurse Notes: no additional

## 2019-03-17 NOTE — Patient Instructions (Signed)
Ms. Martha Gilmore , Thank you for taking time to come for your Medicare Wellness Visit. I appreciate your ongoing commitment to your health goals. Please review the following plan we discussed and let me know if I can assist you in the future.   Screening recommendations/referrals: Colorectal Screening: up to date; last 04/05/15 Mammogram: up to date; scheduled for 12/ Bone Density: up to date; last 05/13/18  Vision and Dental Exams: Recommended annual ophthalmology exams for early detection of glaucoma and other disorders of the eye Recommended annual dental exams for proper oral hygiene  Vaccinations: Influenza vaccine: completed 01/22/19 Pneumococcal vaccine: up to date; last 01/05/15 Tdap vaccine: up to date; last 08/17/10  Shingles vaccine: Shingrix completed   Advanced directives: Please bring a copy of your POA (Power of Victoria) and/or Living Will to your next appointment.  Goals: Recommend to drink at least 6-8 8oz glasses of water per day and consume a balanced diet rich in fresh fruits and vegetables.   Next appointment: Please schedule your Annual Wellness Visit with your Nurse Health Advisor in one year.  Preventive Care 48 Years and Older, Female Preventive care refers to lifestyle choices and visits with your health care provider that can promote health and wellness. What does preventive care include?  A yearly physical exam. This is also called an annual well check.  Dental exams once or twice a year.  Routine eye exams. Ask your health care provider how often you should have your eyes checked.  Personal lifestyle choices, including:  Daily care of your teeth and gums.  Regular physical activity.  Eating a healthy diet.  Avoiding tobacco and drug use.  Limiting alcohol use.  Practicing safe sex.  Taking low-dose aspirin every day if recommended by your health care provider.  Taking vitamin and mineral supplements as recommended by your health care provider.  What happens during an annual well check? The services and screenings done by your health care provider during your annual well check will depend on your age, overall health, lifestyle risk factors, and family history of disease. Counseling  Your health care provider may ask you questions about your:  Alcohol use.  Tobacco use.  Drug use.  Emotional well-being.  Home and relationship well-being.  Sexual activity.  Eating habits.  History of falls.  Memory and ability to understand (cognition).  Work and work Statistician.  Reproductive health. Screening  You may have the following tests or measurements:  Height, weight, and BMI.  Blood pressure.  Lipid and cholesterol levels. These may be checked every 5 years, or more frequently if you are over 35 years old.  Skin check.  Lung cancer screening. You may have this screening every year starting at age 58 if you have a 30-pack-year history of smoking and currently smoke or have quit within the past 15 years.  Fecal occult blood test (FOBT) of the stool. You may have this test every year starting at age 16.  Flexible sigmoidoscopy or colonoscopy. You may have a sigmoidoscopy every 5 years or a colonoscopy every 10 years starting at age 29.  Hepatitis C blood test.  Hepatitis B blood test.  Sexually transmitted disease (STD) testing.  Diabetes screening. This is done by checking your blood sugar (glucose) after you have not eaten for a while (fasting). You may have this done every 1-3 years.  Bone density scan. This is done to screen for osteoporosis. You may have this done starting at age 50.  Mammogram. This may be done  every 1-2 years. Talk to your health care provider about how often you should have regular mammograms. Talk with your health care provider about your test results, treatment options, and if necessary, the need for more tests. Vaccines  Your health care provider may recommend certain vaccines, such  as:  Influenza vaccine. This is recommended every year.  Tetanus, diphtheria, and acellular pertussis (Tdap, Td) vaccine. You may need a Td booster every 10 years.  Zoster vaccine. You may need this after age 65.  Pneumococcal 13-valent conjugate (PCV13) vaccine. One dose is recommended after age 78.  Pneumococcal polysaccharide (PPSV23) vaccine. One dose is recommended after age 56. Talk to your health care provider about which screenings and vaccines you need and how often you need them. This information is not intended to replace advice given to you by your health care provider. Make sure you discuss any questions you have with your health care provider. Document Released: 05/07/2015 Document Revised: 12/29/2015 Document Reviewed: 02/09/2015 Elsevier Interactive Patient Education  2017 [employer] Prevention in the Home Falls can cause injuries. They can happen to people of all ages. There are many things you can do to make your home safe and to help prevent falls. What can I do on the outside of my home?  Regularly fix the edges of walkways and driveways and fix any cracks.  Remove anything that might make you trip as you walk through a door, such as a raised step or threshold.  Trim any bushes or trees on the path to your home.  Use bright outdoor lighting.  Clear any walking paths of anything that might make someone trip, such as rocks or tools.  Regularly check to see if handrails are loose or broken. Make sure that both sides of any steps have handrails.  Any raised decks and porches should have guardrails on the edges.  Have any leaves, snow, or ice cleared regularly.  Use sand or salt on walking paths during winter.  Clean up any spills in your garage right away. This includes oil or grease spills. What can I do in the bathroom?  Use night lights.  Install grab bars by the toilet and in the tub and shower. Do not use towel bars as grab bars.  Use  non-skid mats or decals in the tub or shower.  If you need to sit down in the shower, use a plastic, non-slip stool.  Keep the floor dry. Clean up any water that spills on the floor as soon as it happens.  Remove soap buildup in the tub or shower regularly.  Attach bath mats securely with double-sided non-slip rug tape.  Do not have throw rugs and other things on the floor that can make you trip. What can I do in the bedroom?  Use night lights.  Make sure that you have a light by your bed that is easy to reach.  Do not use any sheets or blankets that are too big for your bed. They should not hang down onto the floor.  Have a firm chair that has side arms. You can use this for support while you get dressed.  Do not have throw rugs and other things on the floor that can make you trip. What can I do in the kitchen?  Clean up any spills right away.  Avoid walking on wet floors.  Keep items that you use a lot in easy-to-reach places.  If you need to reach something above you, use a  strong step stool that has a grab bar.  Keep electrical cords out of the way.  Do not use floor polish or wax that makes floors slippery. If you must use wax, use non-skid floor wax.  Do not have throw rugs and other things on the floor that can make you trip. What can I do with my stairs?  Do not leave any items on the stairs.  Make sure that there are handrails on both sides of the stairs and use them. Fix handrails that are broken or loose. Make sure that handrails are as long as the stairways.  Check any carpeting to make sure that it is firmly attached to the stairs. Fix any carpet that is loose or worn.  Avoid having throw rugs at the top or bottom of the stairs. If you do have throw rugs, attach them to the floor with carpet tape.  Make sure that you have a light switch at the top of the stairs and the bottom of the stairs. If you do not have them, ask someone to add them for you. What  else can I do to help prevent falls?  Wear shoes that:  Do not have high heels.  Have rubber bottoms.  Are comfortable and fit you well.  Are closed at the toe. Do not wear sandals.  If you use a stepladder:  Make sure that it is fully opened. Do not climb a closed stepladder.  Make sure that both sides of the stepladder are locked into place.  Ask someone to hold it for you, if possible.  Clearly mark and make sure that you can see:  Any grab bars or handrails.  First and last steps.  Where the edge of each step is.  Use tools that help you move around (mobility aids) if they are needed. These include:  Canes.  Walkers.  Scooters.  Crutches.  Turn on the lights when you go into a dark area. Replace any light bulbs as soon as they burn out.  Set up your furniture so you have a clear path. Avoid moving your furniture around.  If any of your floors are uneven, fix them.  If there are any pets around you, be aware of where they are.  Review your medicines with your doctor. Some medicines can make you feel dizzy. This can increase your chance of falling. Ask your doctor what other things that you can do to help prevent falls. This information is not intended to replace advice given to you by your health care provider. Make sure you discuss any questions you have with your health care provider. Document Released: 02/04/2009 Document Revised: 09/16/2015 Document Reviewed: 05/15/2014 Elsevier Interactive Patient Education  2017 [employer]. \

## 2019-03-17 NOTE — Progress Notes (Signed)
I have reviewed the documentation from the recent AWV done by Courtney Slade; I agree with the documentation and will follow up on any recommendations or abnormal findings as suggested. Kyland No, MD [facility]    

## 2019-03-21 ENCOUNTER — Telehealth: Payer: Self-pay | Admitting: Family Medicine

## 2019-03-21 NOTE — Telephone Encounter (Signed)
Copied from [place] [id number]. Topic: Referral - Request for Referral >> Mar 21, 2019 11:07 AM Leward Quan A wrote: Has patient seen PCP for this complaint? Yes.   *If NO, is insurance requiring patient see PCP for this issue before PCP can refer them? Referral for which specialty: ENT  Preferred provider/office: None specified Reason for referral: Pain in ears

## 2019-03-24 NOTE — Telephone Encounter (Signed)
See note

## 2019-03-27 DIAGNOSIS — E890 Postprocedural hypothyroidism: Secondary | ICD-10-CM | POA: Diagnosis not present

## 2019-03-28 ENCOUNTER — Other Ambulatory Visit: Payer: Self-pay

## 2019-03-28 ENCOUNTER — Encounter: Payer: Self-pay | Admitting: Family Medicine

## 2019-03-28 DIAGNOSIS — H60393 Other infective otitis externa, bilateral: Secondary | ICD-10-CM

## 2019-04-03 ENCOUNTER — Other Ambulatory Visit: Payer: Self-pay

## 2019-04-03 ENCOUNTER — Ambulatory Visit
Admission: RE | Admit: 2019-04-03 | Discharge: 2019-04-03 | Disposition: A | Discharge status: Home / Self Care | Payer: Medicare Other | Source: Ambulatory Visit | Attending: Family Medicine | Admitting: Family Medicine

## 2019-04-03 DIAGNOSIS — Z1231 Encounter for screening mammogram for malignant neoplasm of breast: Secondary | ICD-10-CM | POA: Diagnosis not present

## 2019-04-03 IMAGING — MG DIGITAL SCREENING BILAT W/ TOMO W/ CAD
8 series · 8 of 24 positions shown · non-contrast
Comparison: Previous exam(s).

ACR Breast Density Category a: The breast tissue is almost entirely
fatty.

CLINICAL DATA: Screening.

EXAM:
DIGITAL SCREENING BILATERAL MAMMOGRAM WITH TOMO AND CAD

[R CC synth-2D]
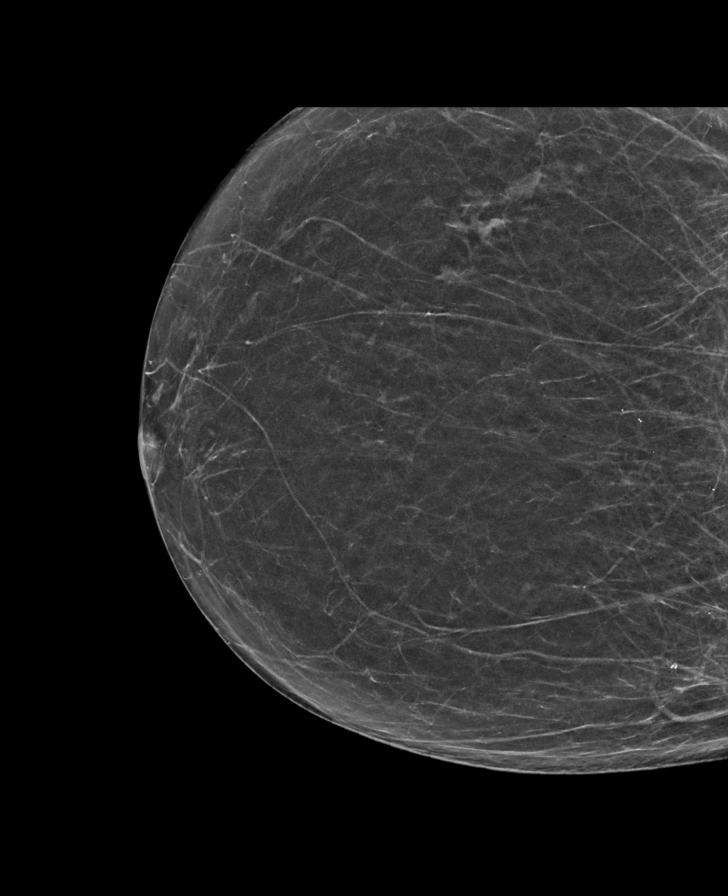

[L MLO synth-2D]
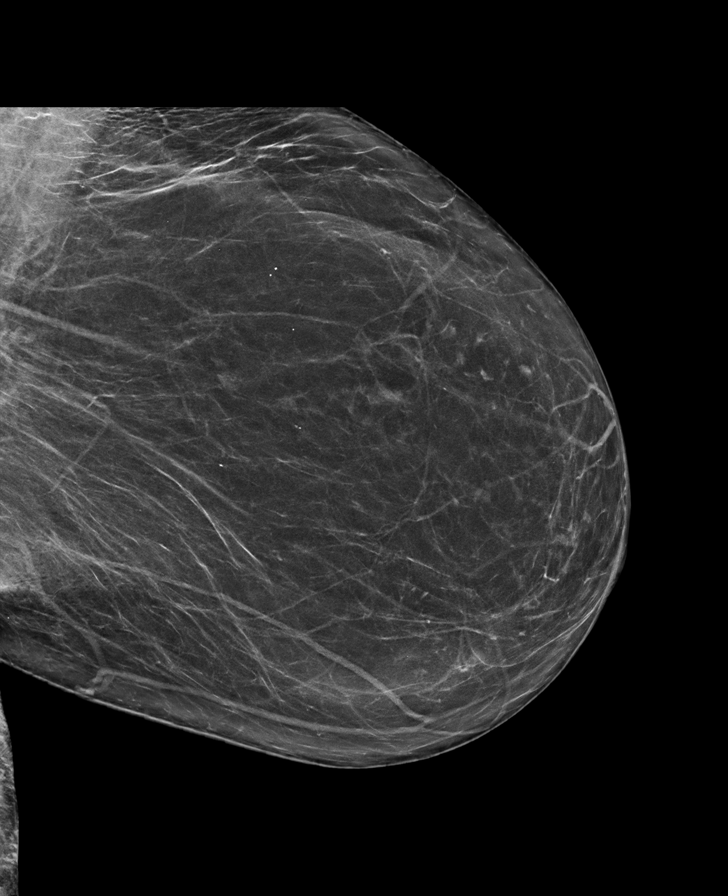

[R MLO synth-2D]
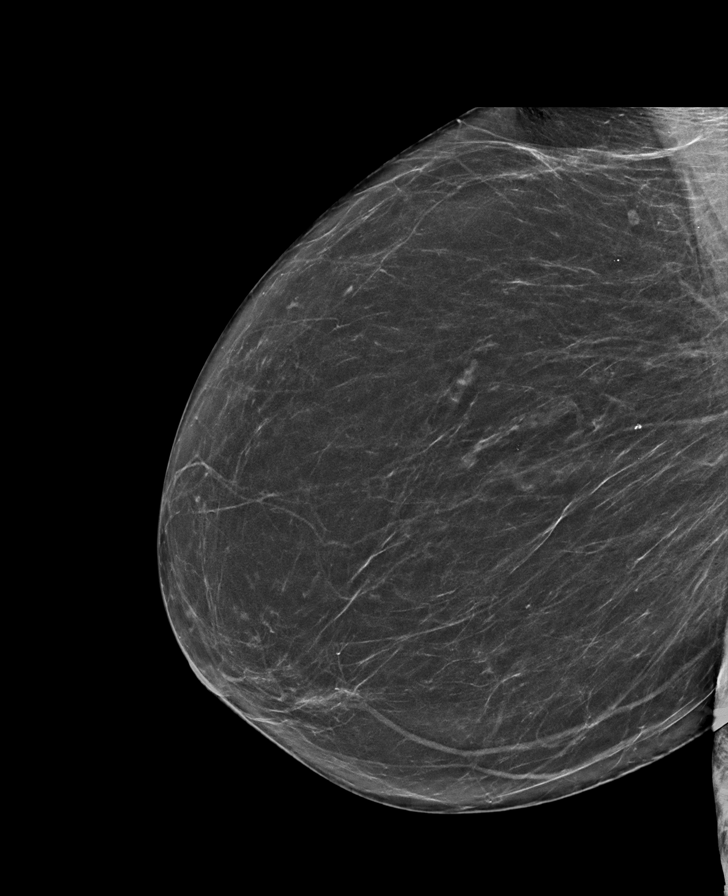

[L CC synth-2D]
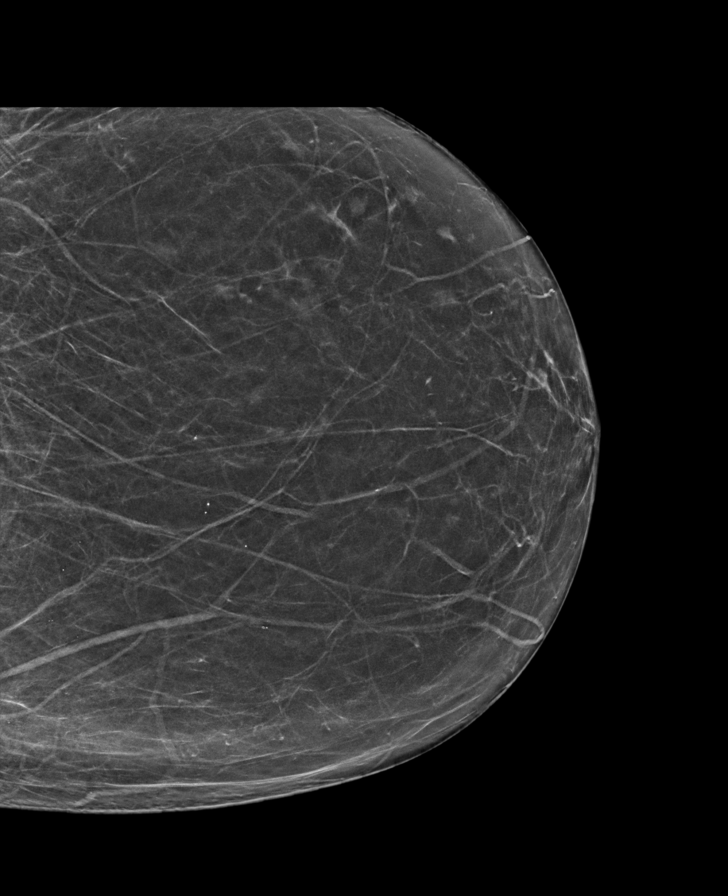

[L CC tomo · tomo slice 32/63.0]
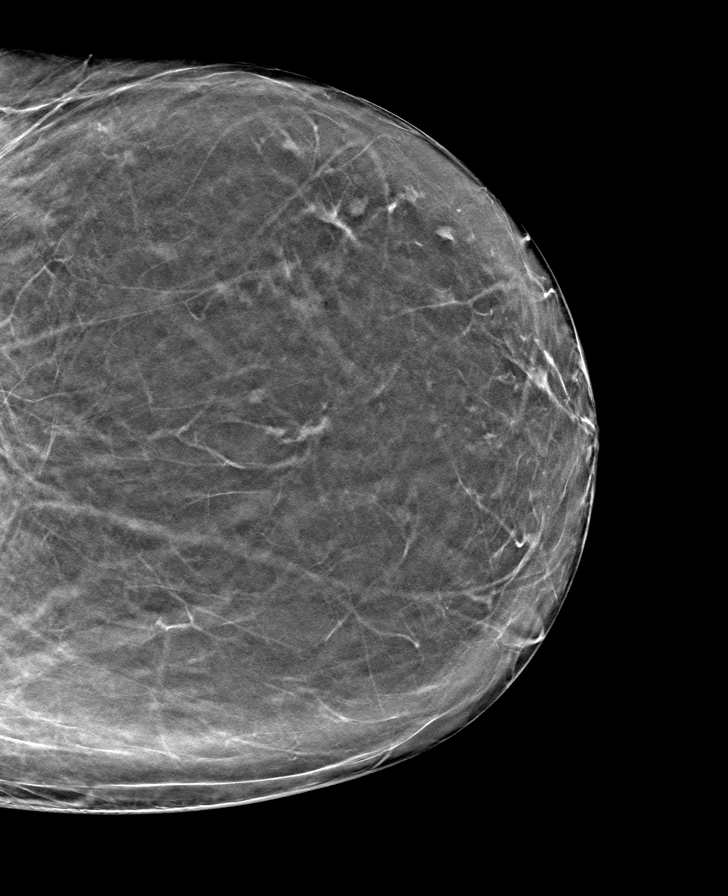

[R MLO tomo · tomo slice 36/71.0]
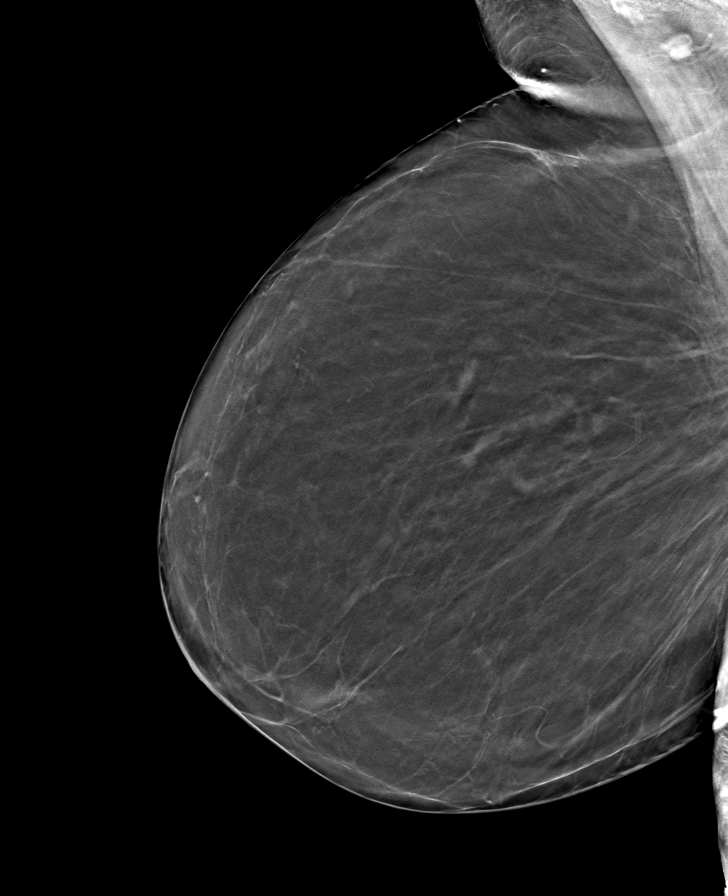

[R CC tomo · tomo slice 31/60.0]
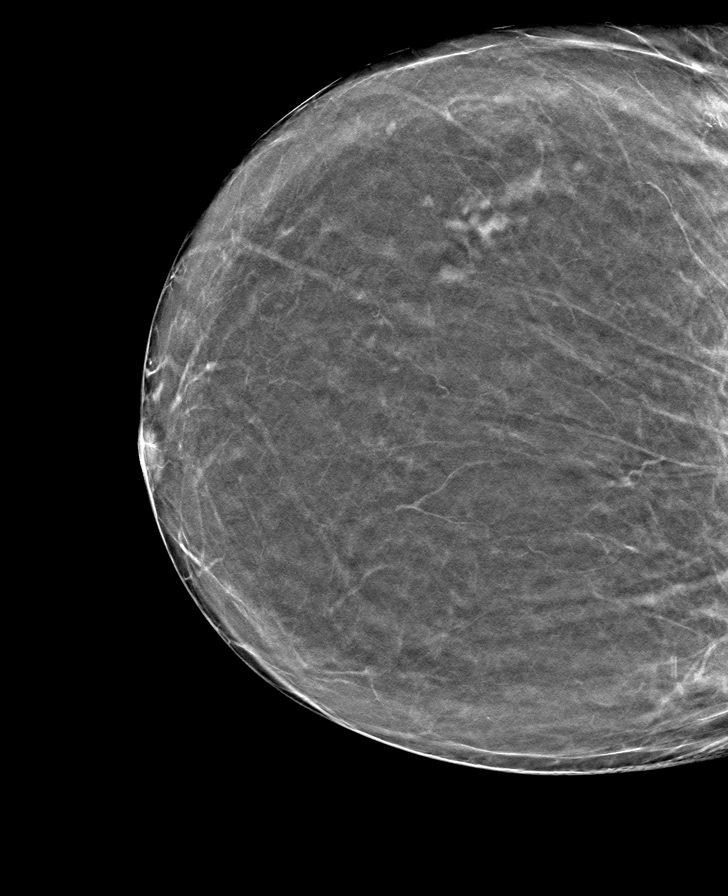

[L MLO tomo · tomo slice 38/75.0]
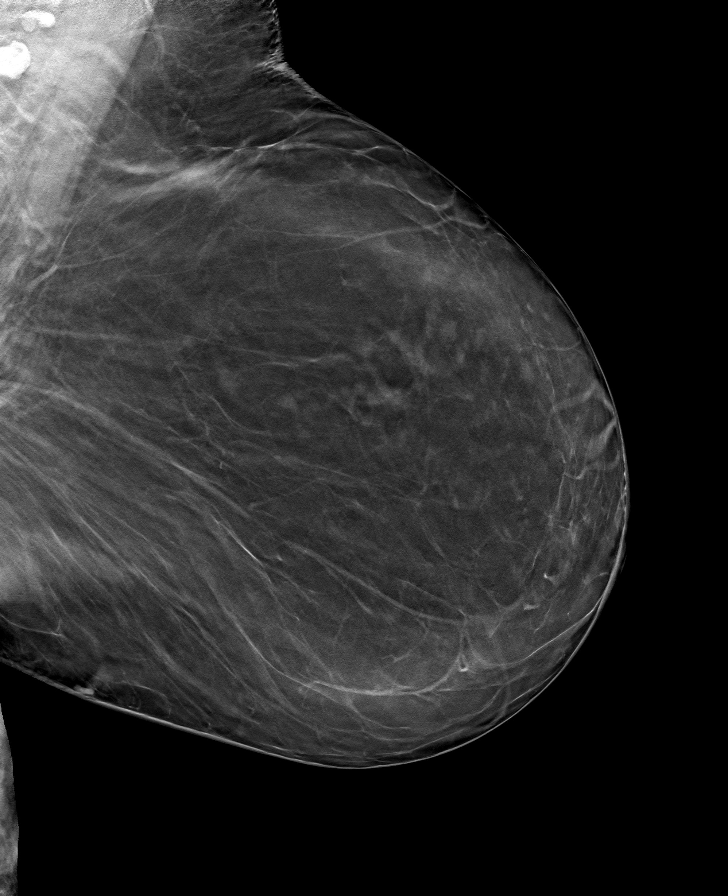

[8 of 24 positions shown; findings below may reference images not displayed]

FINDINGS: There are no findings suspicious for malignancy. Images were
processed with CAD.
IMPRESSION: No mammographic evidence of malignancy. A result letter of this
screening mammogram will be mailed directly to the patient.

RECOMMENDATION:
Screening mammogram in one year. (Code:[TA])

BI-RADS CATEGORY  1: Negative.

## 2019-04-07 ENCOUNTER — Encounter (INDEPENDENT_AMBULATORY_CARE_PROVIDER_SITE_OTHER): Payer: Self-pay | Admitting: Otolaryngology

## 2019-04-07 ENCOUNTER — Other Ambulatory Visit: Payer: Self-pay

## 2019-04-07 ENCOUNTER — Ambulatory Visit (INDEPENDENT_AMBULATORY_CARE_PROVIDER_SITE_OTHER): Payer: Medicare Other | Admitting: Otolaryngology

## 2019-04-07 VITALS — Temp 97.7°F

## 2019-04-07 DIAGNOSIS — H60313 Diffuse otitis externa, bilateral: Secondary | ICD-10-CM | POA: Diagnosis not present

## 2019-04-07 NOTE — Progress Notes (Signed)
HPI: Martha Gilmore is a 78 y.o. female who presents for evaluation of itchy ears.  She has had hearing aids for over 3 years.  More recently over the past few months whenever she wears her hearing aids her ears tend to itch.  When she leaves them out the ears get better.  She has talked previously with the audiologist concerning this.  He wanted her ears checked.  She has seen her medical doctor who tried her on some antifungal eardrops but this did not seem to help.  Past Medical History:  Diagnosis Date  . Cataracts, bilateral 09/23/2015  . Clostridium difficile infection 04/16/2017   H/o C.diff due to clindamicyin 09/2016; two rounds of treatment for cure. Dr. Benson Norway  . Depression   . Hyperlipidemia   . Hypertension   . OAB (overactive bladder) 12/16/2013  . Osteopenia 08/05/2012   Dexa scan 07/2012 mild osteopenia, T = -1.1  . Seasonal allergies   . Urge and stress incontinence    Past Surgical History:  Procedure Laterality Date  . APPENDECTOMY  1971  . CESAREAN SECTION    . CHOLECYSTECTOMY    . Teeth Implants    . TRIGGER FINGER RELEASE    . VAGINAL HYSTERECTOMY     Social History   Socioeconomic History  . Marital status: Widowed    Spouse name: Not on file  . Number of children: Not on file  . Years of education: Not on file  . Highest education level: Not on file  Occupational History  . Not on file  Tobacco Use  . Smoking status: Never Smoker  . Smokeless tobacco: Never Used  Substance and Sexual Activity  . Alcohol use: No  . Drug use: No  . Sexual activity: Never    Partners: Male  Other Topics Concern  . Not on file  Social History Narrative  . Not on file   Social Determinants of Health   Financial Resource Strain:   . Difficulty of Paying Living Expenses: Not on file  Food Insecurity:   . Worried About Charity fundraiser in the Last Year: Not on file  . Ran Out of Food in the Last Year: Not on file  Transportation Needs:   . Lack of  Transportation (Medical): Not on file  . Lack of Transportation (Non-Medical): Not on file  Physical Activity:   . Days of Exercise per Week: Not on file  . Minutes of Exercise per Session: Not on file  Stress:   . Feeling of Stress : Not on file  Social Connections:   . Frequency of Communication with Friends and Family: Not on file  . Frequency of Social Gatherings with Friends and Family: Not on file  . Attends Religious Services: Not on file  . Active Member of Clubs or Organizations: Not on file  . Attends Archivist Meetings: Not on file  . Marital Status: Not on file   Family History  Problem Relation Age of Onset  . Heart disease Mother   . Cancer Father   . Heart disease Father   . Lung cancer Sister   . Heart disease Sister   . Diabetes Sister   . Brain cancer Brother   . Heart disease Maternal Grandmother   . Kidney cancer Sister    Allergies  Allergen Reactions  . Aspirin Itching  . Meperidine Other (See Comments)    hallucinations Other Reaction: HALLUCINATIONS hallucinations   . Penicillins Other (See Comments) and Rash  Other Reaction: RASH & HIVES   . Statins Itching  . Cephalexin Other (See Comments) and Rash    Other Reaction: RASH & ITCHING   . Clindamycin/Lincomycin Other (See Comments) and Rash    Other Reaction: RASH & ITCHING   . Demerol  [Meperidine Hcl] Other (See Comments)    hallucinations  . Other Rash  . Tape Rash   Prior to Admission medications   Medication Sig Start Date End Date Taking? Authorizing Provider  acetic acid 2 % otic solution Place 4 drops into both ears 3 (three) times daily. 03/07/19  Yes Leamon Arnt, MD  atorvastatin (LIPITOR) 10 MG tablet Take 1 tablet (10 mg total) by mouth at bedtime. 05/31/18  Yes Leamon Arnt, MD  citalopram (CELEXA) 20 MG tablet Take 1 tablet (20 mg total) by mouth daily. 05/31/18  Yes Leamon Arnt, MD  Levothyroxine Sodium 100 MCG CAPS Take 1 capsule (100 mcg total) by  mouth daily. 05/31/18  Yes Leamon Arnt, MD  Multiple Vitamins-Minerals (PRESERVISION AREDS 2 PO) Take by mouth.   Yes [provider]  omeprazole (PRILOSEC) 20 MG capsule Take 1 capsule (20 mg total) by mouth daily. 05/31/18  Yes Leamon Arnt, MD  oxybutynin (DITROPAN-XL) 10 MG 24 hr tablet TAKE ONE TABLET BY MOUTH DAILY AT BEDTIME  02/10/19  Yes Leamon Arnt, MD  Polyethyl Glycol-Propyl Glycol (SYSTANE OP) Apply 1 drop to eye as needed.   Yes [provider]     Positive ROS: Otherwise negative  All other systems have been reviewed and were otherwise negative with the exception of those mentioned in the HPI and as above.  Physical Exam: Constitutional: Alert, well-appearing, no acute distress Ears: External ears without lesions or tenderness. Ear canals are clear bilaterally with intact, clear TMs bilaterally.  No evidence of inflammation or infection in the ear canals. Nasal: External nose without lesions. Septum relatively midline.. Clear nasal passages.  No signs of infection Oral: Lips and gums without lesions. Tongue and palate mucosa without lesions. Posterior oropharynx clear. Neck: No palpable adenopathy or masses Respiratory: Breathing comfortably  Skin: No facial/neck lesions or rash noted.  Procedures  Assessment: Itchy ears probably related to contact dermatitis from the hearing aids.  Plan: Suggested following up with the audiologist concerning use of hypoallergenic casing of the hearing aids. In the meantime prescribed Diprolene 0.05% cream or ointment to the ear canal when placing the hearing aids in. I discussed with that this most likely is a reaction to the casing of the hearing aids would follow-up with her audiologist concerning change in this.  Radene Journey, MD

## 2019-04-28 ENCOUNTER — Other Ambulatory Visit: Payer: Self-pay

## 2019-04-29 ENCOUNTER — Ambulatory Visit (INDEPENDENT_AMBULATORY_CARE_PROVIDER_SITE_OTHER): Payer: Medicare Other | Admitting: Family Medicine

## 2019-04-29 ENCOUNTER — Encounter: Payer: Self-pay | Admitting: Family Medicine

## 2019-04-29 VITALS — BP 124/78 | HR 65 | Temp 97.6°F | Ht <= 58 in | Wt 170.8 lb

## 2019-04-29 DIAGNOSIS — M5137 Other intervertebral disc degeneration, lumbosacral region: Secondary | ICD-10-CM

## 2019-04-29 DIAGNOSIS — K219 Gastro-esophageal reflux disease without esophagitis: Secondary | ICD-10-CM

## 2019-04-29 DIAGNOSIS — F329 Major depressive disorder, single episode, unspecified: Secondary | ICD-10-CM

## 2019-04-29 DIAGNOSIS — E782 Mixed hyperlipidemia: Secondary | ICD-10-CM | POA: Diagnosis not present

## 2019-04-29 DIAGNOSIS — E890 Postprocedural hypothyroidism: Secondary | ICD-10-CM | POA: Diagnosis not present

## 2019-04-29 DIAGNOSIS — N3281 Overactive bladder: Secondary | ICD-10-CM | POA: Diagnosis not present

## 2019-04-29 DIAGNOSIS — M8580 Other specified disorders of bone density and structure, unspecified site: Secondary | ICD-10-CM

## 2019-04-29 NOTE — Progress Notes (Signed)
Subjective  Chief Complaint  Patient presents with  . Annual Exam    HPI: Martha Gilmore is a 79 y.o. female who presents to [facility] today for a Female Wellness Visit. She also has the concerns and/or needs as listed above in the chief complaint. These will be addressed in addition to the Health Maintenance Visit.   Wellness Visit: annual visit with health maintenance review and exam without Pap   Doing well and all screens/imms are up to date. AWV done in November. No concerns identified.  Chronic disease f/u and/or acute problem visit: (deemed necessary to be done in addition to the wellness visit):  All chronic problems listed below were addressed. meds reviewed.   Low thyroid well controlled clinically and compliant with meds.  HLD due for recheck on statin.   depresson on celexa well controlled.   OAB improved on ditropan  GERD stable on protonix.  Assessment  1. Postablative hypothyroidism   2. Mixed hyperlipidemia   3. Major depression, chronic   4. Osteopenia, unspecified location   5. DDD (degenerative disc disease), lumbosacral   6. Gastroesophageal reflux disease without esophagitis   7. OAB (overactive bladder)      Plan  Female Wellness Visit:  Age appropriate Health Maintenance and Prevention measures were discussed with patient. Included topics are cancer screening recommendations, ways to keep healthy (see AVS) including dietary and exercise recommendations, regular eye and dental care, use of seat belts, and avoidance of moderate alcohol use and tobacco use.   BMI: discussed patient's BMI and encouraged positive lifestyle modifications to help get to or maintain a target BMI.  HM needs and immunizations were addressed and ordered. See below for orders. See HM and immunization section for updates.  Routine labs and screening tests ordered including cmp, cbc and lipids where appropriate.  Discussed recommendations  regarding Vit D and calcium supplementation (see AVS)  Chronic disease management visit and/or acute problem visit:  As listed above, chronic problems are well controlled. Continue all meds w/o change and check lab work.  Follow up: No follow-ups on file.  Orders Placed This Encounter  Procedures  . CBC w/Diff  . CMP  . Lipids  . TSH   No orders of the defined types were placed in this encounter.     Lifestyle: There is no height or weight on file to calculate BMI. Wt Readings from Last 3 Encounters:  03/17/19 169 lb (76.7 kg)  03/07/19 168 lb (76.2 kg)  03/26/18 184 lb (83.5 kg)     Patient Active Problem List   Diagnosis Date Noted  . Postablative hypothyroidism 12/20/2015    Priority: High  . Major depression, chronic 04/16/2017    Priority: Medium  . Mixed hyperlipidemia 04/16/2017    Priority: Medium  . History of Graves' disease 03/22/2015    Priority: Medium    Overview:  Treated with RAI-131 01/2015 Dr Steffanie Dunn   . GERD (gastroesophageal reflux disease) 12/13/2012    Priority: Medium    EGD 09/2016 - Dr. Benson Norway, normal   . Osteopenia 08/05/2012    Priority: Medium    DEXA 04/2018: stable osteopenia. Recheck 2-3 years. DEXA 01/2015: T = -1.5, osteopenia; rec calcium and vit D and exercise. Recheck 2-3 years. Bone density scan April 2014-mild osteopenia, T equals -1.1   . DDD (degenerative disc disease), lumbosacral 10/22/2010    Priority: Medium  . Bilateral sensorineural hearing loss 02/06/2016    Priority: Low    Overview:  [facility] review 2017   . OAB (overactive bladder) 12/16/2013    Priority: Low  . Osteoarthritis of [place] joint of thumb 08/13/2012    Priority: Low  . Seasonal allergies 08/13/2012    Priority: Low  . Urge and stress incontinence 08/13/2012    Priority: Low  . Chondromalacia, knee, right 11/05/2017  . Osteoarthrosis, localized, primary, knee, right 11/05/2017  . Clostridium difficile infection 04/16/2017    H/o C.diff due  to clindamicyin 09/2016; two rounds of treatment for cure. Dr. Benson Norway   . Family history of early CAD 04/16/2017    Treadmill test 04/2016 - equivocal: didn't reach maximal HR; otherwise negative.     Health Maintenance  Topic Date Due  . MAMMOGRAM  04/02/2020  . DEXA SCAN  05/13/2020  . TETANUS/TDAP  08/16/2020  . INFLUENZA VACCINE  Completed  . PNA vac Low Risk Adult  Completed   Immunization History  Administered Date(s) Administered  . Influenza Split 01/15/2012  . Influenza, High Dose Seasonal PF 01/30/2013, 01/05/2015, 02/17/2016, 02/02/2017, 01/18/2018, 12/11/2018  . Influenza, Seasonal, Injecte, Preservative Fre 01/21/2014  . Influenza-Unspecified 01/09/2011, 01/15/2012, 01/21/2014, 01/22/2018  . Pneumococcal Conjugate-13 01/05/2015  . Pneumococcal Polysaccharide-23 04/24/2006, 11/18/2007, 05/30/2012  . Tdap 08/17/2010  . Zoster 04/25/1995  . Zoster Recombinat (Shingrix) 06/29/2018, 11/01/2018   We updated and reviewed the patient's past history in detail and it is documented below. Allergies: Patient is allergic to aspirin; meperidine; penicillins; statins; cephalexin; clindamycin/lincomycin; demerol  [meperidine hcl]; other; and tape. Past Medical History Patient  has a past medical history of Cataracts, bilateral (09/23/2015), Clostridium difficile infection (04/16/2017), Depression, Hyperlipidemia, Hypertension, OAB (overactive bladder) (12/16/2013), Osteopenia (08/05/2012), Seasonal allergies, and Urge and stress incontinence. Past Surgical History Patient  has a past surgical history that includes Cholecystectomy; Cesarean section; Teeth Implants; Appendectomy (1971); Trigger finger release; and Vaginal hysterectomy. Family History: Patient family history includes Brain cancer in her brother; Cancer in her father; Diabetes in her sister; Heart disease in her father, maternal grandmother, mother, and sister; Kidney cancer in her sister; Lung cancer in her sister. Social  History:  Patient  reports that she has never smoked. She has never used smokeless tobacco. She reports that she does not drink alcohol or use drugs.  Review of Systems: Constitutional: negative for fever or malaise Ophthalmic: negative for photophobia, double vision or loss of vision Cardiovascular: negative for chest pain, dyspnea on exertion, or new LE swelling Respiratory: negative for SOB or persistent cough Gastrointestinal: negative for abdominal pain, change in bowel habits or melena Genitourinary: negative for dysuria or gross hematuria, no abnormal uterine bleeding or disharge Musculoskeletal: negative for new gait disturbance or muscular weakness Integumentary: negative for new or persistent rashes, no breast lumps Neurological: negative for TIA or stroke symptoms Psychiatric: negative for SI or delusions Allergic/Immunologic: negative for hives  Patient Care Team    Relationship Specialty Notifications Start End  Leamon Arnt, MD PCP - General Family Medicine  03/21/16   Susette Racer, MD Consulting Physician Endocrinology  04/16/17   Carol Ada, MD Consulting Physician Gastroenterology  04/16/17   Roseanne Kaufman, MD Consulting Physician Orthopedic Surgery  02/28/18   Rolm Bookbinder, MD Consulting Physician Dermatology  02/28/18   Stephannie Li, [place] Physician Ophthalmology  03/17/19   [facility] Consulting Physician Audiology  03/17/19     Objective  Vitals: There were no vitals taken for this visit. General:  Well developed, well nourished, no acute distress  Psych:  Alert and  orientedx3,normal mood and affect HEENT:  Normocephalic, atraumatic, non-icteric sclera, PERRL, oropharynx is clear without mass or exudate, supple neck without adenopathy, mass or thyromegaly Cardiovascular:  Normal S1, S2, RRR without gallop, rub or murmur, nondisplaced PMI Respiratory:  Good breath sounds bilaterally, CTAB with normal respiratory  effort Gastrointestinal: normal bowel sounds, soft, non-tender, no noted masses. No HSM MSK: no deformities, contusions. Joints are without erythema or swelling. Spine and CVA region are nontender Skin:  Warm, no rashes or suspicious lesions noted Neurologic:    Mental status is normal. CN 2-11 are normal. Gross motor and sensory exams are normal. Normal gait. No tremor    Commons side effects, risks, benefits, and alternatives for medications and treatment plan prescribed today were discussed, and the patient expressed understanding of the given instructions. Patient is instructed to call or message via MyChart if he/she has any questions or concerns regarding our treatment plan. No barriers to understanding were identified. We discussed Red Flag symptoms and signs in detail. Patient expressed understanding regarding what to do in case of urgent or emergency type symptoms.   Medication list was reconciled, printed and provided to the patient in AVS. Patient instructions and summary information was reviewed with the patient as documented in the AVS. This note was prepared with assistance of Dragon voice recognition software. Occasional wrong-word or sound-a-like substitutions may have occurred due to the inherent limitations of voice recognition software  This visit occurred during the SARS-CoV-2 public health emergency.  Safety protocols were in place, including screening questions prior to the visit, additional usage of staff PPE, and extensive cleaning of exam room while observing appropriate contact time as indicated for disinfecting solutions.

## 2019-04-29 NOTE — Patient Instructions (Signed)
Please return in 12 months for your annual complete physical; please come fasting.  I will release your lab results to you on your MyChart account with further instructions. Please reply with any questions.  Have a great year! Call me if you need me!  If you have any questions or concerns, please don't hesitate to send me a message via MyChart or call the office at [phone]. Thank you for visiting with Martha Gilmore today! It's our pleasure caring for you.   Preventive Care 33 Years and Older, Female Preventive care refers to lifestyle choices and visits with your health care provider that can promote health and wellness. This includes:  A yearly physical exam. This is also called an annual well check.  Regular dental and eye exams.  Immunizations.  Screening for certain conditions.  Healthy lifestyle choices, such as diet and exercise. What can I expect for my preventive care visit? Physical exam Your health care provider will check:  Height and weight. These may be used to calculate body mass index (BMI), which is a measurement that tells if you are at a healthy weight.  Heart rate and blood pressure.  Your skin for abnormal spots. Counseling Your health care provider may ask you questions about:  Alcohol, tobacco, and drug use.  Emotional well-being.  Home and relationship well-being.  Sexual activity.  Eating habits.  History of falls.  Memory and ability to understand (cognition).  Work and work Statistician.  Pregnancy and menstrual history. What immunizations do I need?  Influenza (flu) vaccine  This is recommended every year. Tetanus, diphtheria, and pertussis (Tdap) vaccine  You may need a Td booster every 10 years. Varicella (chickenpox) vaccine  You may need this vaccine if you have not already been vaccinated. Zoster (shingles) vaccine  You may need this after age 44. Pneumococcal conjugate (PCV13) vaccine  One dose is recommended after age  78. Pneumococcal polysaccharide (PPSV23) vaccine  One dose is recommended after age 78. Measles, mumps, and rubella (MMR) vaccine  You may need at least one dose of MMR if you were born in 1957 or later. You may also need a second dose. Meningococcal conjugate (MenACWY) vaccine  You may need this if you have certain conditions. Hepatitis A vaccine  You may need this if you have certain conditions or if you travel or work in places where you may be exposed to hepatitis A. Hepatitis B vaccine  You may need this if you have certain conditions or if you travel or work in places where you may be exposed to hepatitis B. Haemophilus influenzae type b (Hib) vaccine  You may need this if you have certain conditions. You may receive vaccines as individual doses or as more than one vaccine together in one shot (combination vaccines). Talk with your health care provider about the risks and benefits of combination vaccines. What tests do I need? Blood tests  Lipid and cholesterol levels. These may be checked every 5 years, or more frequently depending on your overall health.  Hepatitis C test.  Hepatitis B test. Screening  Lung cancer screening. You may have this screening every year starting at age 78 if you have a 30-pack-year history of smoking and currently smoke or have quit within the past 15 years.  Colorectal cancer screening. All adults should have this screening starting at age 78 and continuing until age 80. Your health care provider may recommend screening at age 1 if you are at increased risk. You will have tests every 1-10  years, depending on your results and the type of screening test.  Diabetes screening. This is done by checking your blood sugar (glucose) after you have not eaten for a while (fasting). You may have this done every 1-3 years.  Mammogram. This may be done every 1-2 years. Talk with your health care provider about how often you should have regular  mammograms.  BRCA-related cancer screening. This may be done if you have a family history of breast, ovarian, tubal, or peritoneal cancers. Other tests  Sexually transmitted disease (STD) testing.  Bone density scan. This is done to screen for osteoporosis. You may have this done starting at age 78. Follow these instructions at home: Eating and drinking  Eat a diet that includes fresh fruits and vegetables, whole grains, lean protein, and low-fat dairy products. Limit your intake of foods with high amounts of sugar, saturated fats, and salt.  Take vitamin and mineral supplements as recommended by your health care provider.  Do not drink alcohol if your health care provider tells you not to drink.  If you drink alcohol: ? Limit how much you have to 0-1 drink a day. ? Be aware of how much alcohol is in your drink. In the [place] one drink equals one 12 oz bottle of beer (355 mL), one 5 oz glass of wine (148 mL), or one 1 oz glass of hard liquor (44 mL). Lifestyle  Take daily care of your teeth and gums.  Stay active. Exercise for at least 30 minutes on 5 or more days each week.  Do not use any products that contain nicotine or tobacco, such as cigarettes, e-cigarettes, and chewing tobacco. If you need help quitting, ask your health care provider.  If you are sexually active, practice safe sex. Use a condom or other form of protection in order to prevent STIs (sexually transmitted infections).  Talk with your health care provider about taking a low-dose aspirin or statin. What's next?  Go to your health care provider once a year for a well check visit.  Ask your health care provider how often you should have your eyes and teeth checked.  Stay up to date on all vaccines. This information is not intended to replace advice given to you by your health care provider. Make sure you discuss any questions you have with your health care provider. Document Revised: 04/04/2018 Document  Reviewed: 04/04/2018 Elsevier Patient Education  2020 [employer].

## 2019-04-30 LAB — LIPID PANEL
Cholesterol: 207 mg/dL — ABNORMAL HIGH (ref 0–200)
HDL: 61.5 mg/dL (ref >39.00)
LDL Cholesterol: 131 mg/dL — ABNORMAL HIGH (ref 0–99)
NonHDL: 145.47
Total CHOL/HDL Ratio: 3
Triglycerides: 71 mg/dL (ref 0.0–149.0)
VLDL: 14.2 mg/dL (ref 0.0–40.0)

## 2019-04-30 LAB — TSH: TSH: 2.12 u[IU]/mL (ref 0.35–4.50)

## 2019-04-30 LAB — CBC WITH DIFFERENTIAL/PLATELET
Basophils Absolute: 0.1 10*3/uL (ref 0.0–0.1)
Basophils Relative: 0.7 % (ref 0.0–3.0)
Eosinophils Absolute: 0.1 10*3/uL (ref 0.0–0.7)
Eosinophils Relative: 1.2 % (ref 0.0–5.0)
HCT: 39 % (ref 36.0–46.0)
Hemoglobin: 13 g/dL (ref 12.0–15.0)
Lymphocytes Relative: 25.4 % (ref 12.0–46.0)
Lymphs Abs: 1.9 10*3/uL (ref 0.7–4.0)
MCHC: 33.2 g/dL (ref 30.0–36.0)
MCV: 88.7 fl (ref 78.0–100.0)
Monocytes Absolute: 0.7 10*3/uL (ref 0.1–1.0)
Monocytes Relative: 8.5 % (ref 3.0–12.0)
Neutro Abs: 4.9 10*3/uL (ref 1.4–7.7)
Neutrophils Relative %: 64.2 % (ref 43.0–77.0)
Platelets: 213 10*3/uL (ref 150.0–400.0)
RBC: 4.4 Mil/uL (ref 3.87–5.11)
RDW: 14.3 % (ref 11.5–15.5)
WBC: 7.7 10*3/uL (ref 4.0–10.5)

## 2019-04-30 LAB — COMPREHENSIVE METABOLIC PANEL
ALT: 14 U/L (ref 0–35)
AST: 19 U/L (ref 0–37)
Albumin: 4 g/dL (ref 3.5–5.2)
Alkaline Phosphatase: 99 U/L (ref 39–117)
BUN: 20 mg/dL (ref 6–23)
CO2: 28 mEq/L (ref 19–32)
Calcium: 9.2 mg/dL (ref 8.4–10.5)
Chloride: 103 mEq/L (ref 96–112)
Creatinine, Ser: 0.91 mg/dL (ref 0.40–1.20)
GFR: 59.82 mL/min — ABNORMAL LOW (ref >60.00)
Glucose, Bld: 98 mg/dL (ref 70–99)
Potassium: 4.3 mEq/L (ref 3.5–5.1)
Sodium: 138 mEq/L (ref 135–145)
Total Bilirubin: 0.6 mg/dL (ref 0.2–1.2)
Total Protein: 6.2 g/dL (ref 6.0–8.3)

## 2019-05-05 ENCOUNTER — Encounter: Payer: Self-pay | Admitting: Family Medicine

## 2019-05-06 ENCOUNTER — Encounter: Payer: Self-pay | Admitting: Family Medicine

## 2019-05-08 ENCOUNTER — Other Ambulatory Visit: Payer: Self-pay

## 2019-05-08 DIAGNOSIS — N3281 Overactive bladder: Secondary | ICD-10-CM

## 2019-05-08 MED ORDER — OXYBUTYNIN CHLORIDE ER 10 MG PO TB24: 10.0000 mg | ORAL_TABLET | Freq: Every day | ORAL | 0 refills | Status: DC

## 2019-05-15 ENCOUNTER — Ambulatory Visit: Payer: Medicare Other | Attending: Internal Medicine

## 2019-05-15 DIAGNOSIS — Z23 Encounter for immunization: Secondary | ICD-10-CM | POA: Insufficient documentation

## 2019-05-15 NOTE — Progress Notes (Signed)
   Covid-19 Vaccination Clinic  Name:  Martha Gilmore    MRN: [id number] DOB: February 08, 1941  05/15/2019  Martha Gilmore was observed post Covid-19 immunization for 15 minutes without incidence. She was provided with Vaccine Information Sheet and instruction to access the V-Safe system.   Martha Gilmore was instructed to call 911 with any severe reactions post vaccine: Marland Kitchen Difficulty breathing  . Swelling of your face and throat  . A fast heartbeat  . A bad rash all over your body  . Dizziness and weakness    Immunizations Administered    Name Date Dose VIS Date Route   Pfizer COVID-19 Vaccine 05/15/2019 12:03 PM 0.3 mL 04/04/2019 Intramuscular   Manufacturer: [place]   Lot: [id number]   [place]: [id number]

## 2019-05-21 ENCOUNTER — Other Ambulatory Visit: Payer: Self-pay | Admitting: Family Medicine

## 2019-05-22 ENCOUNTER — Other Ambulatory Visit: Payer: Self-pay | Admitting: Family Medicine

## 2019-06-05 ENCOUNTER — Ambulatory Visit: Payer: Medicare Other | Attending: Internal Medicine

## 2019-06-05 DIAGNOSIS — Z23 Encounter for immunization: Secondary | ICD-10-CM | POA: Insufficient documentation

## 2019-06-05 NOTE — Progress Notes (Signed)
   Covid-19 Vaccination Clinic  Name:  Martha Gilmore    MRN: [id number] DOB: 07-08-1940  06/05/2019  [name] was observed post Covid-19 immunization for 15 minutes without incidence. She was provided with Vaccine Information Sheet and instruction to access the V-Safe system.   [name] was instructed to call 911 with any severe reactions post vaccine: Marland Kitchen Difficulty breathing  . Swelling of your face and throat  . A fast heartbeat  . A bad rash all over your body  . Dizziness and weakness    Immunizations Administered    Name Date Dose VIS Date Route   Pfizer COVID-19 Vaccine 06/05/2019 12:46 PM 0.3 mL 04/04/2019 Intramuscular   Manufacturer: [place]   Lot: [id number]   [place]: [id number]

## 2019-08-06 DIAGNOSIS — M5136 Other intervertebral disc degeneration, lumbar region: Secondary | ICD-10-CM | POA: Diagnosis not present

## 2019-08-06 DIAGNOSIS — M9903 Segmental and somatic dysfunction of lumbar region: Secondary | ICD-10-CM | POA: Diagnosis not present

## 2019-08-06 DIAGNOSIS — M5032 Other cervical disc degeneration, mid-cervical region, unspecified level: Secondary | ICD-10-CM | POA: Diagnosis not present

## 2019-08-06 DIAGNOSIS — S29012A Strain of muscle and tendon of back wall of thorax, initial encounter: Secondary | ICD-10-CM | POA: Diagnosis not present

## 2019-08-06 DIAGNOSIS — M9902 Segmental and somatic dysfunction of thoracic region: Secondary | ICD-10-CM | POA: Diagnosis not present

## 2019-08-06 DIAGNOSIS — M9901 Segmental and somatic dysfunction of cervical region: Secondary | ICD-10-CM | POA: Diagnosis not present

## 2019-08-12 DIAGNOSIS — S29012A Strain of muscle and tendon of back wall of thorax, initial encounter: Secondary | ICD-10-CM | POA: Diagnosis not present

## 2019-08-12 DIAGNOSIS — M9902 Segmental and somatic dysfunction of thoracic region: Secondary | ICD-10-CM | POA: Diagnosis not present

## 2019-08-12 DIAGNOSIS — M5136 Other intervertebral disc degeneration, lumbar region: Secondary | ICD-10-CM | POA: Diagnosis not present

## 2019-08-12 DIAGNOSIS — M9903 Segmental and somatic dysfunction of lumbar region: Secondary | ICD-10-CM | POA: Diagnosis not present

## 2019-08-12 DIAGNOSIS — M5032 Other cervical disc degeneration, mid-cervical region, unspecified level: Secondary | ICD-10-CM | POA: Diagnosis not present

## 2019-08-12 DIAGNOSIS — M9901 Segmental and somatic dysfunction of cervical region: Secondary | ICD-10-CM | POA: Diagnosis not present

## 2019-08-13 DIAGNOSIS — Z85828 Personal history of other malignant neoplasm of skin: Secondary | ICD-10-CM | POA: Diagnosis not present

## 2019-08-13 DIAGNOSIS — D2272 Melanocytic nevi of left lower limb, including hip: Secondary | ICD-10-CM | POA: Diagnosis not present

## 2019-08-13 DIAGNOSIS — D1801 Hemangioma of skin and subcutaneous tissue: Secondary | ICD-10-CM | POA: Diagnosis not present

## 2019-08-13 DIAGNOSIS — D2271 Melanocytic nevi of right lower limb, including hip: Secondary | ICD-10-CM | POA: Diagnosis not present

## 2019-08-13 DIAGNOSIS — L821 Other seborrheic keratosis: Secondary | ICD-10-CM | POA: Diagnosis not present

## 2019-08-13 DIAGNOSIS — L738 Other specified follicular disorders: Secondary | ICD-10-CM | POA: Diagnosis not present

## 2019-08-13 DIAGNOSIS — D225 Melanocytic nevi of trunk: Secondary | ICD-10-CM | POA: Diagnosis not present

## 2019-08-14 ENCOUNTER — Encounter: Payer: Self-pay | Admitting: Family Medicine

## 2019-08-14 DIAGNOSIS — S29012A Strain of muscle and tendon of back wall of thorax, initial encounter: Secondary | ICD-10-CM | POA: Diagnosis not present

## 2019-08-14 DIAGNOSIS — M9901 Segmental and somatic dysfunction of cervical region: Secondary | ICD-10-CM | POA: Diagnosis not present

## 2019-08-14 DIAGNOSIS — M9902 Segmental and somatic dysfunction of thoracic region: Secondary | ICD-10-CM | POA: Diagnosis not present

## 2019-08-14 DIAGNOSIS — M5032 Other cervical disc degeneration, mid-cervical region, unspecified level: Secondary | ICD-10-CM | POA: Diagnosis not present

## 2019-08-14 DIAGNOSIS — M9903 Segmental and somatic dysfunction of lumbar region: Secondary | ICD-10-CM | POA: Diagnosis not present

## 2019-08-14 DIAGNOSIS — M5136 Other intervertebral disc degeneration, lumbar region: Secondary | ICD-10-CM | POA: Diagnosis not present

## 2019-08-21 ENCOUNTER — Other Ambulatory Visit: Payer: Self-pay | Admitting: Family Medicine

## 2019-08-21 DIAGNOSIS — N3281 Overactive bladder: Secondary | ICD-10-CM

## 2019-08-25 DIAGNOSIS — S29012A Strain of muscle and tendon of back wall of thorax, initial encounter: Secondary | ICD-10-CM | POA: Diagnosis not present

## 2019-08-25 DIAGNOSIS — M5032 Other cervical disc degeneration, mid-cervical region, unspecified level: Secondary | ICD-10-CM | POA: Diagnosis not present

## 2019-08-25 DIAGNOSIS — M9903 Segmental and somatic dysfunction of lumbar region: Secondary | ICD-10-CM | POA: Diagnosis not present

## 2019-08-25 DIAGNOSIS — M9902 Segmental and somatic dysfunction of thoracic region: Secondary | ICD-10-CM | POA: Diagnosis not present

## 2019-08-25 DIAGNOSIS — M9901 Segmental and somatic dysfunction of cervical region: Secondary | ICD-10-CM | POA: Diagnosis not present

## 2019-08-25 DIAGNOSIS — M5136 Other intervertebral disc degeneration, lumbar region: Secondary | ICD-10-CM | POA: Diagnosis not present

## 2019-08-29 ENCOUNTER — Other Ambulatory Visit: Payer: Self-pay

## 2019-08-29 ENCOUNTER — Ambulatory Visit (INDEPENDENT_AMBULATORY_CARE_PROVIDER_SITE_OTHER): Payer: Medicare Other | Admitting: Family Medicine

## 2019-08-29 ENCOUNTER — Encounter: Payer: Self-pay | Admitting: Family Medicine

## 2019-08-29 VITALS — BP 120/58 | HR 64 | Temp 96.9°F | Resp 14 | Ht 60.0 in | Wt 170.6 lb

## 2019-08-29 DIAGNOSIS — M461 Sacroiliitis, not elsewhere classified: Secondary | ICD-10-CM

## 2019-08-29 DIAGNOSIS — M5137 Other intervertebral disc degeneration, lumbosacral region: Secondary | ICD-10-CM | POA: Diagnosis not present

## 2019-08-29 DIAGNOSIS — S39011A Strain of muscle, fascia and tendon of abdomen, initial encounter: Secondary | ICD-10-CM | POA: Diagnosis not present

## 2019-08-29 MED ORDER — DICLOFENAC SODIUM 75 MG PO TBEC
75.0000 mg | DELAYED_RELEASE_TABLET | Freq: Two times a day (BID) | ORAL | 0 refills | Status: DC
Start: 2019-08-29 — End: 2019-11-24

## 2019-08-29 NOTE — Progress Notes (Signed)
Subjective  CC:  Chief Complaint  Patient presents with  . Back Pain    started 6 weeks ago, has been cleaning out building - possible strain    HPI: Martha Gilmore is a 79 y.o. female who presents to the office today to address the problems listed above in the chief complaint.  Cleaning out late husbands barn full of "stuff". Lots of bending over, stooping and some heavy lifting. Has 6 weeks of bilateral and midline low back pain w/o radicular sxs. Mild to moderate and limits activities. No pain while lying down or sleeping. No leg weakness. Has known lumbar DJD. Has seen chiropractor and acupuncturist w/o relief. occ tylenol ES with some relief. Using ice and heat regularly.   Also felt acute pull in right lower abdomen after lifting heavy concrete bird bath. Now remains mildly painful but improving.   Assessment  1. Sacroiliitis ([place])   2. DDD (degenerative disc disease), lumbosacral   3. Abdominal wall strain, initial encounter      Plan   Strain and back pain:  Trial of ice and nsaids. Back exercises. F/u if not improving. Reassured, no palpable hernia at this time.   Follow up: Return if symptoms worsen or fail to improve.  Visit date not found  No orders of the defined types were placed in this encounter.  Meds ordered this encounter  Medications  . diclofenac (VOLTAREN) 75 MG EC tablet    Sig: Take 1 tablet (75 mg total) by mouth 2 (two) times daily.    Dispense:  30 tablet    Refill:  0      I reviewed the patients updated PMH, FH, and SocHx.    Patient Active Problem List   Diagnosis Date Noted  . Postablative hypothyroidism 12/20/2015    Priority: High  . Chondromalacia, knee, right 11/05/2017    Priority: Medium  . Osteoarthrosis, localized, primary, knee, right 11/05/2017    Priority: Medium  . Major depression, chronic 04/16/2017    Priority: Medium  . Mixed hyperlipidemia 04/16/2017    Priority: Medium  . History of Graves' disease 03/22/2015    Priority: Medium  . GERD (gastroesophageal reflux disease) 12/13/2012    Priority: Medium  . Osteopenia 08/05/2012    Priority: Medium  . DDD (degenerative disc disease), lumbosacral 10/22/2010    Priority: Medium  . Bilateral sensorineural hearing loss 02/06/2016    Priority: Low  . OAB (overactive bladder) 12/16/2013    Priority: Low  . Osteoarthritis of [place] joint of thumb 08/13/2012    Priority: Low  . Seasonal allergies 08/13/2012    Priority: Low  . Urge and stress incontinence 08/13/2012    Priority: Low  . Clostridium difficile infection 04/16/2017  . Family history of early CAD 04/16/2017   Current Meds  Medication Sig  . atorvastatin (LIPITOR) 10 MG tablet TAKE 1 TABLET BY MOUTH EVERYDAY AT BEDTIME  . citalopram (CELEXA) 20 MG tablet TAKE 1 TABLET BY MOUTH EVERY DAY  . levothyroxine (SYNTHROID) 100 MCG tablet TAKE 1 CAPSULE (100 MCG TOTAL) BY MOUTH DAILY.  Marland Kitchen omeprazole (PRILOSEC) 20 MG capsule TAKE 1 CAPSULE BY MOUTH EVERY DAY  . oxybutynin (DITROPAN-XL) 10 MG 24 hr tablet TAKE ONE TABLET BY MOUTH DAILY AT BEDTIME   . Polyethyl Glycol-Propyl Glycol (SYSTANE OP) Apply 1 drop to eye as needed.    Allergies: Patient is allergic to aspirin; meperidine; penicillins; statins; cephalexin; clindamycin/lincomycin; demerol  [meperidine hcl]; and tape. Family History: Patient family history includes Brain  cancer in her brother; Cancer in her father; Diabetes in her sister; Heart disease in her father, maternal grandmother, mother, and sister; Kidney cancer in her sister; Lung cancer in her sister. Social History:  Patient  reports that she has never smoked. She has never used smokeless tobacco. She reports that she does not drink alcohol or use drugs.  Review of Systems: Constitutional: Negative for fever malaise or anorexia Cardiovascular: negative for chest pain Respiratory: negative for SOB or persistent cough Gastrointestinal: negative for abdominal pain  Objective    Vitals: BP (!) 120/58   Pulse 64   Temp (!) 96.9 F (36.1 C) (Temporal)   Resp 14   Ht 5' (1.524 m)   Wt 170 lb 9.6 oz (77.4 kg)   SpO2 99%   BMI 33.32 kg/m  General: no acute distress , A&Ox3 Back: bilateral SI joint ttp present, sciatic notches are tender as well. No paravertebral mm ttp or spasm. Fairly good rom w/o pain except with lateral flexion. Neg SLR bilaterally. Nl gait Abd: tender over right lower abdominal wall w/o hernia. Soft. No rebound or guarding.    Commons side effects, risks, benefits, and alternatives for medications and treatment plan prescribed today were discussed, and the patient expressed understanding of the given instructions. Patient is instructed to call or message via MyChart if he/she has any questions or concerns regarding our treatment plan. No barriers to understanding were identified. We discussed Red Flag symptoms and signs in detail. Patient expressed understanding regarding what to do in case of urgent or emergency type symptoms.   Medication list was reconciled, printed and provided to the patient in AVS. Patient instructions and summary information was reviewed with the patient as documented in the AVS. This note was prepared with assistance of Dragon voice recognition software. Occasional wrong-word or sound-a-like substitutions may have occurred due to the inherent limitations of voice recognition software  This visit occurred during the SARS-CoV-2 public health emergency.  Safety protocols were in place, including screening questions prior to the visit, additional usage of staff PPE, and extensive cleaning of exam room while observing appropriate contact time as indicated for disinfecting solutions.

## 2019-08-29 NOTE — Patient Instructions (Signed)
Please return in 2-4 weeks if not improving.   If you have any questions or concerns, please don't hesitate to send me a message via MyChart or call the office at [phone]. Thank you for visiting with [place] today! It's our pleasure caring for you.   Back Exercises The following exercises strengthen the muscles that help to support the trunk and back. They also help to keep the lower back flexible. Doing these exercises can help to prevent back pain or lessen existing pain.  If you have back pain or discomfort, try doing these exercises 2-3 times each day or as told by your health care provider.  As your pain improves, do them once each day, but increase the number of times that you repeat the steps for each exercise (do more repetitions).  To prevent the recurrence of back pain, continue to do these exercises once each day or as told by your health care provider. Do exercises exactly as told by your health care provider and adjust them as directed. It is normal to feel mild stretching, pulling, tightness, or discomfort as you do these exercises, but you should stop right away if you feel sudden pain or your pain gets worse. Exercises Single knee to chest Repeat these steps 3-5 times for each leg: 1. Lie on your back on a firm bed or the floor with your legs extended. 2. Bring one knee to your chest. Your other leg should stay extended and in contact with the floor. 3. Hold your knee in place by grabbing your knee or thigh with both hands and hold. 4. Pull on your knee until you feel a gentle stretch in your lower back or buttocks. 5. Hold the stretch for 10-30 seconds. 6. Slowly release and straighten your leg. Pelvic tilt Repeat these steps 5-10 times: 1. Lie on your back on a firm bed or the floor with your legs extended. 2. Bend your knees so they are pointing toward the ceiling and your feet are flat on the floor. 3. Tighten your lower abdominal muscles to press your lower back  against the floor. This motion will tilt your pelvis so your tailbone points up toward the ceiling instead of pointing to your feet or the floor. 4. With gentle tension and even breathing, hold this position for 5-10 seconds. Cat-cow Repeat these steps until your lower back becomes more flexible: 1. Get into a hands-and-knees position on a firm surface. Keep your hands under your shoulders, and keep your knees under your hips. You may place padding under your knees for comfort. 2. Let your head hang down toward your chest. Contract your abdominal muscles and point your tailbone toward the floor so your lower back becomes rounded like the back of a cat. 3. Hold this position for 5 seconds. 4. Slowly lift your head, let your abdominal muscles relax and point your tailbone up toward the ceiling so your back forms a sagging arch like the back of a cow. 5. Hold this position for 5 seconds.  Press-ups Repeat these steps 5-10 times: 1. Lie on your abdomen (face-down) on the floor. 2. Place your palms near your head, about shoulder-width apart. 3. Keeping your back as relaxed as possible and keeping your hips on the floor, slowly straighten your arms to raise the top half of your body and lift your shoulders. Do not use your back muscles to raise your upper torso. You may adjust the placement of your hands to make yourself more comfortable. 4. Hold  this position for 5 seconds while you keep your back relaxed. 5. Slowly return to lying flat on the floor.  Bridges Repeat these steps 10 times: 1. Lie on your back on a firm surface. 2. Bend your knees so they are pointing toward the ceiling and your feet are flat on the floor. Your arms should be flat at your sides, next to your body. 3. Tighten your buttocks muscles and lift your buttocks off the floor until your waist is at almost the same height as your knees. You should feel the muscles working in your buttocks and the back of your thighs. If you do  not feel these muscles, slide your feet 1-2 inches farther away from your buttocks. 4. Hold this position for 3-5 seconds. 5. Slowly lower your hips to the starting position, and allow your buttocks muscles to relax completely. If this exercise is too easy, try doing it with your arms crossed over your chest. Abdominal crunches Repeat these steps 5-10 times: 1. Lie on your back on a firm bed or the floor with your legs extended. 2. Bend your knees so they are pointing toward the ceiling and your feet are flat on the floor. 3. Cross your arms over your chest. 4. Tip your chin slightly toward your chest without bending your neck. 5. Tighten your abdominal muscles and slowly raise your trunk (torso) high enough to lift your shoulder blades a tiny bit off the floor. Avoid raising your torso higher than that because it can put too much stress on your low back and does not help to strengthen your abdominal muscles. 6. Slowly return to your starting position. Back lifts Repeat these steps 5-10 times: 1. Lie on your abdomen (face-down) with your arms at your sides, and rest your forehead on the floor. 2. Tighten the muscles in your legs and your buttocks. 3. Slowly lift your chest off the floor while you keep your hips pressed to the floor. Keep the back of your head in line with the curve in your back. Your eyes should be looking at the floor. 4. Hold this position for 3-5 seconds. 5. Slowly return to your starting position. Contact a health care provider if:  Your back pain or discomfort gets much worse when you do an exercise.  Your worsening back pain or discomfort does not lessen within 2 hours after you exercise. If you have any of these problems, stop doing these exercises right away. Do not do them again unless your health care provider says that you can. Get help right away if:  You develop sudden, severe back pain. If this happens, stop doing the exercises right away. Do not do them  again unless your health care provider says that you can. This information is not intended to replace advice given to you by your health care provider. Make sure you discuss any questions you have with your health care provider. Document Revised: 08/15/2018 Document Reviewed: 01/10/2018 Elsevier Patient Education  [place].

## 2019-09-10 DIAGNOSIS — H16223 Keratoconjunctivitis sicca, not specified as Sjogren's, bilateral: Secondary | ICD-10-CM | POA: Diagnosis not present

## 2019-09-10 DIAGNOSIS — H353131 Nonexudative age-related macular degeneration, bilateral, early dry stage: Secondary | ICD-10-CM | POA: Diagnosis not present

## 2019-09-18 ENCOUNTER — Other Ambulatory Visit: Payer: Self-pay | Admitting: Family Medicine

## 2019-09-18 DIAGNOSIS — N3281 Overactive bladder: Secondary | ICD-10-CM

## 2019-09-18 MED ORDER — OXYBUTYNIN CHLORIDE ER 10 MG PO TB24: 10.0000 mg | ORAL_TABLET | Freq: Every day | ORAL | 0 refills | Status: DC

## 2019-09-24 ENCOUNTER — Other Ambulatory Visit: Payer: Self-pay

## 2019-09-24 ENCOUNTER — Ambulatory Visit (INDEPENDENT_AMBULATORY_CARE_PROVIDER_SITE_OTHER): Payer: Medicare Other | Admitting: Family Medicine

## 2019-09-24 ENCOUNTER — Encounter: Payer: Self-pay | Admitting: Family Medicine

## 2019-09-24 ENCOUNTER — Ambulatory Visit (INDEPENDENT_AMBULATORY_CARE_PROVIDER_SITE_OTHER): Payer: Medicare Other

## 2019-09-24 VITALS — BP 138/68 | HR 58 | Temp 97.7°F | Resp 15 | Wt 174.4 lb

## 2019-09-24 DIAGNOSIS — M545 Low back pain, unspecified: Secondary | ICD-10-CM

## 2019-09-24 DIAGNOSIS — M5137 Other intervertebral disc degeneration, lumbosacral region: Secondary | ICD-10-CM

## 2019-09-24 IMAGING — DX DG LUMBAR SPINE 2-3V
3 series · 3 of 3 positions shown · non-contrast
Comparison: None.

CLINICAL DATA: Low back pain for 6 weeks.  No known injury.

EXAM:
LUMBAR SPINE - 2-3 VIEW

[lumbar spine ap]
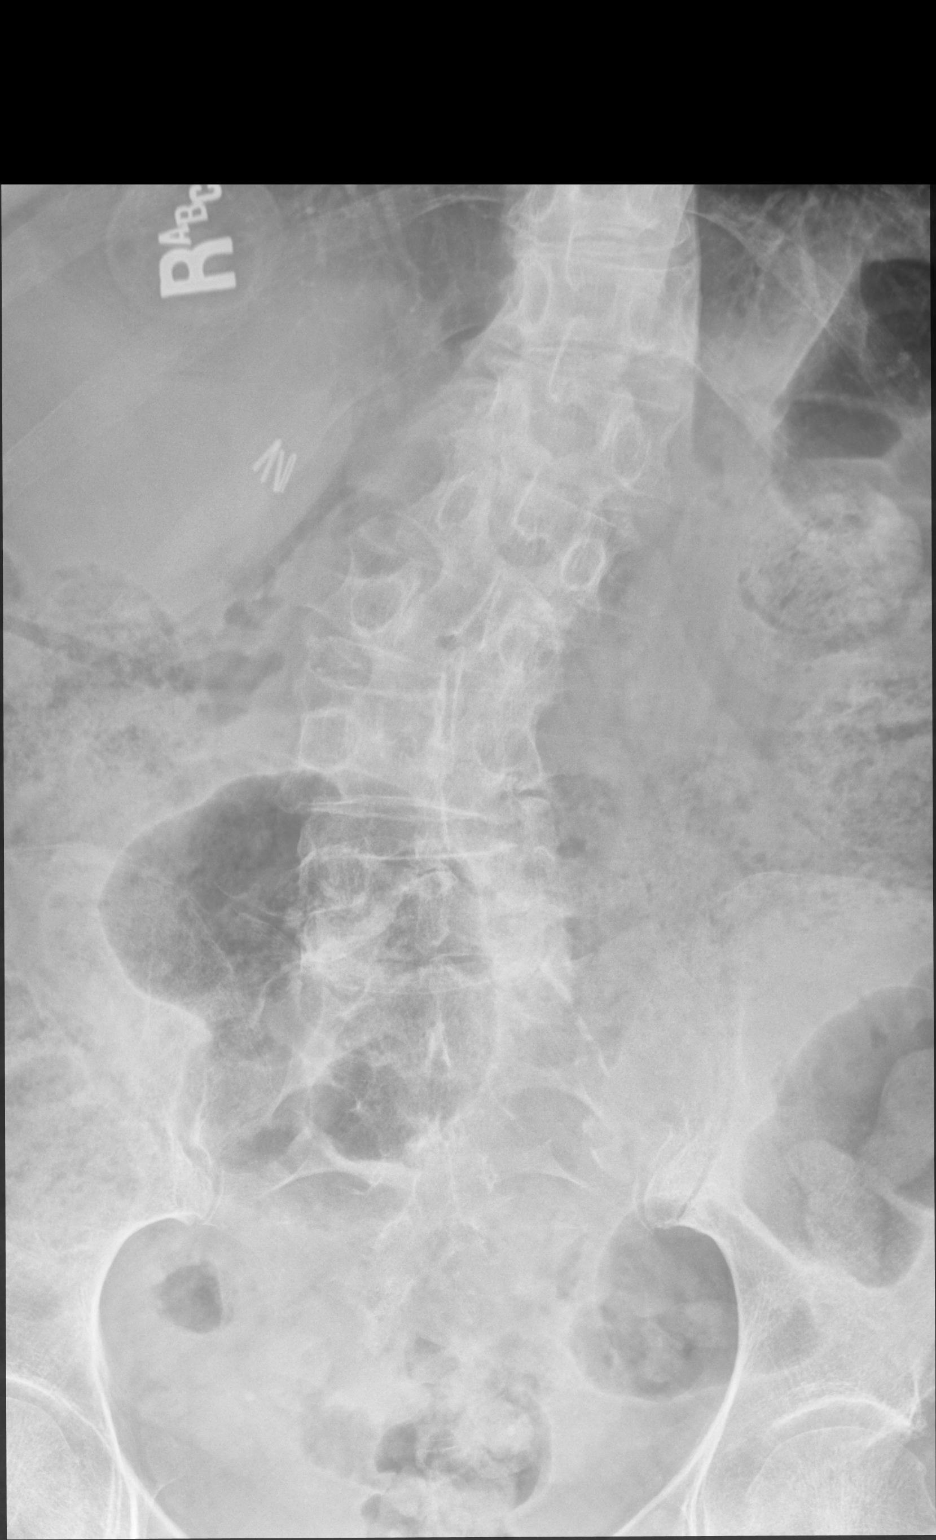

[lumbar spine lat (1 of 2)]
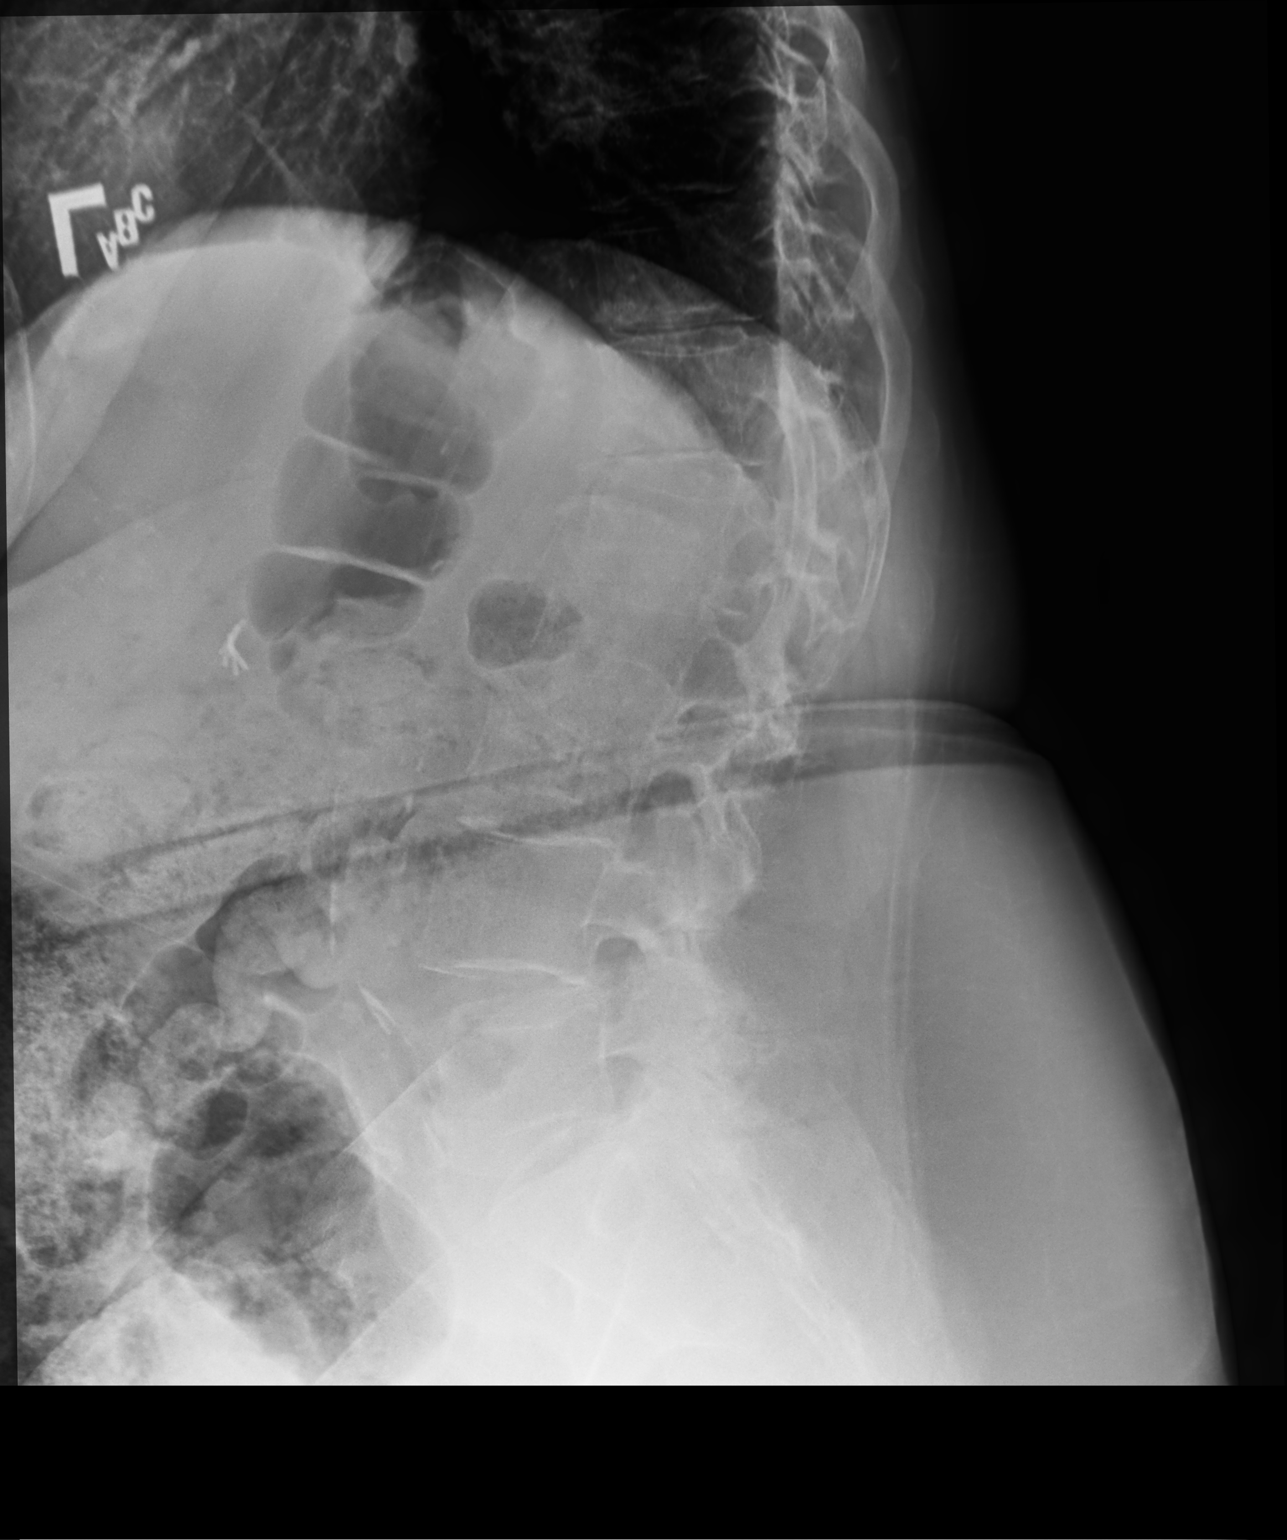

[lumbar spine lat (2 of 2)]
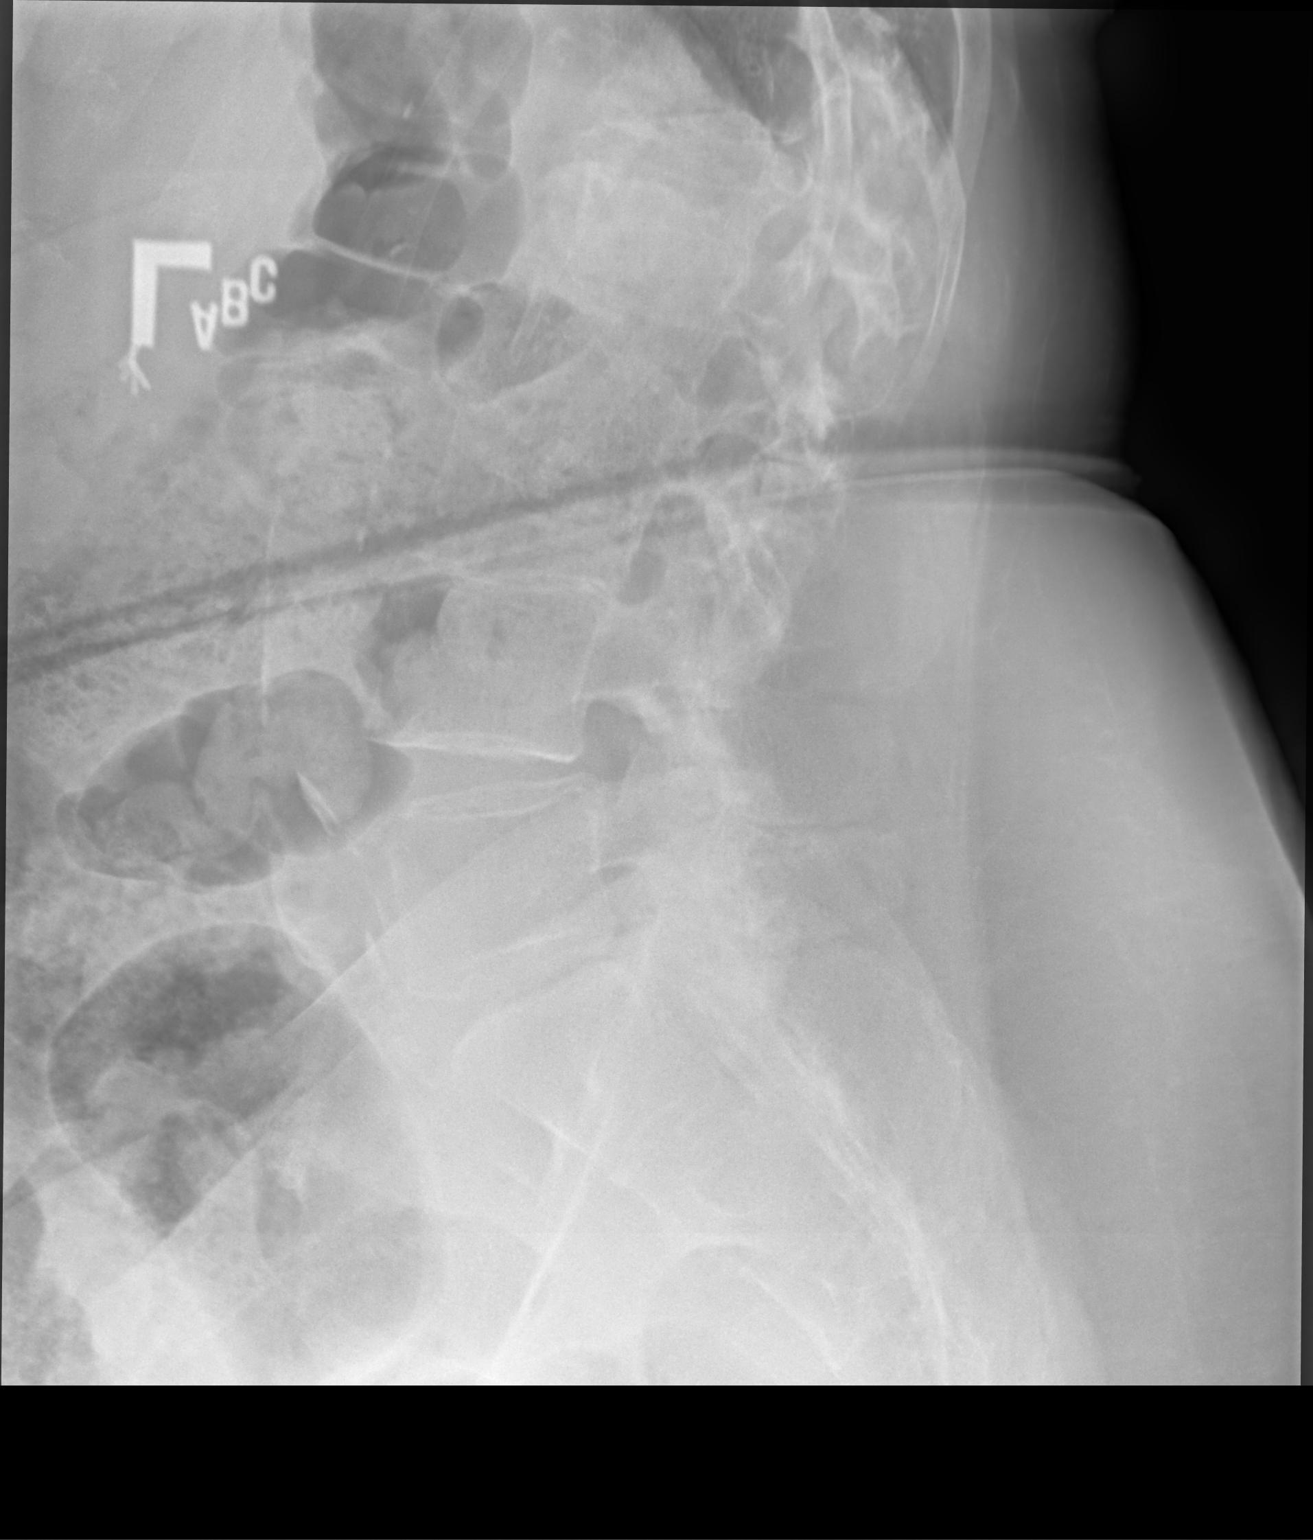

[3 of 3 positions shown; findings below may reference images not displayed]

FINDINGS: There is marked convex right scoliosis with the apex at L3-4. No
listhesis is identified. No fracture is seen. Facet degenerative
disease appears worst at L4-5 and L5-S1. Loss of disc space height
is worst eccentric to the left at L2-3 and L3-4. Very large colonic
stool burden is noted. Aortic atherosclerosis is seen.
IMPRESSION: No acute finding.

Marked convex left scoliosis. Degenerative disc disease appears
worst at L2-3 and L3-4. Lower lumbar facet degenerative disease also
noted.

Very large colonic stool burden.

Aortic Atherosclerosis ([FZ]-[FZ]).

## 2019-09-24 NOTE — Patient Instructions (Signed)
Please follow up in 6 months for recheck.   I have referred you to an orthopedic specialist to see if we can get your back doing better. We will call you to get you scheduled.   If you have any questions or concerns, please don't hesitate to send me a message via MyChart or call the office at [phone]. Thank you for visiting with [place] today! It's our pleasure caring for you.

## 2019-09-24 NOTE — Progress Notes (Signed)
Subjective  CC:  Chief Complaint  Patient presents with  . sacroiliitis    1 month f/u, still experiencing symptoms, bilateral lower back wrapping around to hips/lower abdomen    HPI: Martha Gilmore is a 79 y.o. female who presents to the office today to address the problems listed above in the chief complaint.  See last note. Still with pain. Bilateral low back pain worse with standing. nsaids w/ only some relief. No radicular sxs. No new sxs. No trauma. Pain radiates to groin. Ok if sitting or lying. Limits activities.   Assessment  1. Bilateral low back pain, unspecified chronicity, unspecified whether sciatica present   2. DDD (degenerative disc disease), lumbosacral      Plan   LBP:  ? Compression fracture, DJD, spinal stenosis or other. Check xrays and refer to ortho for further eval/treatment.   Follow up: prn  Visit date not found  Orders Placed This Encounter  Procedures  . DG Lumbar Spine 2-3 Views   No orders of the defined types were placed in this encounter.     I reviewed the patients updated PMH, FH, and SocHx.    Patient Active Problem List   Diagnosis Date Noted  . Postablative hypothyroidism 12/20/2015    Priority: High  . Chondromalacia, knee, right 11/05/2017    Priority: Medium  . Osteoarthrosis, localized, primary, knee, right 11/05/2017    Priority: Medium  . Major depression, chronic 04/16/2017    Priority: Medium  . Mixed hyperlipidemia 04/16/2017    Priority: Medium  . History of Graves' disease 03/22/2015    Priority: Medium  . GERD (gastroesophageal reflux disease) 12/13/2012    Priority: Medium  . Osteopenia 08/05/2012    Priority: Medium  . DDD (degenerative disc disease), lumbosacral 10/22/2010    Priority: Medium  . Bilateral sensorineural hearing loss 02/06/2016    Priority: Low  . OAB (overactive bladder) 12/16/2013    Priority: Low  . Osteoarthritis of [place] joint of thumb 08/13/2012    Priority: Low  . Seasonal  allergies 08/13/2012    Priority: Low  . Urge and stress incontinence 08/13/2012    Priority: Low  . Clostridium difficile infection 04/16/2017  . Family history of early CAD 04/16/2017   Current Meds  Medication Sig  . atorvastatin (LIPITOR) 10 MG tablet TAKE 1 TABLET BY MOUTH EVERYDAY AT BEDTIME  . citalopram (CELEXA) 20 MG tablet TAKE 1 TABLET BY MOUTH EVERY DAY  . levothyroxine (SYNTHROID) 100 MCG tablet TAKE 1 CAPSULE (100 MCG TOTAL) BY MOUTH DAILY.  . Multiple Vitamins-Minerals (PRESERVISION AREDS PO) Take by mouth.  Marland Kitchen omeprazole (PRILOSEC) 20 MG capsule TAKE 1 CAPSULE BY MOUTH EVERY DAY  . oxybutynin (DITROPAN-XL) 10 MG 24 hr tablet Take 1 tablet (10 mg total) by mouth at bedtime.  Vladimir Faster Glycol-Propyl Glycol (SYSTANE OP) Apply 1 drop to eye as needed.    Allergies: Patient is allergic to aspirin; meperidine; penicillins; statins; cephalexin; clindamycin/lincomycin; demerol  [meperidine hcl]; and tape. Family History: Patient family history includes Brain cancer in her brother; Cancer in her father; Diabetes in her sister; Heart disease in her father, maternal grandmother, mother, and sister; Kidney cancer in her sister; Lung cancer in her sister. Social History:  Patient  reports that she has never smoked. She has never used smokeless tobacco. She reports that she does not drink alcohol or use drugs.  Review of Systems: Constitutional: Negative for fever malaise or anorexia Cardiovascular: negative for chest pain Respiratory: negative for SOB or  persistent cough Gastrointestinal: negative for abdominal pain  Objective  Vitals: BP 138/68   Pulse (!) 58   Temp 97.7 F (36.5 C) (Temporal)   Resp 15   Wt 174 lb 6.4 oz (79.1 kg)   SpO2 98%   BMI 34.06 kg/m  General: no acute distress , A&Ox3 Back: bilat SI joint ttp, no spinal ttp. FROM, neg SLR bilaterally    Commons side effects, risks, benefits, and alternatives for medications and treatment plan prescribed  today were discussed, and the patient expressed understanding of the given instructions. Patient is instructed to call or message via MyChart if he/she has any questions or concerns regarding our treatment plan. No barriers to understanding were identified. We discussed Red Flag symptoms and signs in detail. Patient expressed understanding regarding what to do in case of urgent or emergency type symptoms.   Medication list was reconciled, printed and provided to the patient in AVS. Patient instructions and summary information was reviewed with the patient as documented in the AVS. This note was prepared with assistance of Dragon voice recognition software. Occasional wrong-word or sound-a-like substitutions may have occurred due to the inherent limitations of voice recognition software  This visit occurred during the SARS-CoV-2 public health emergency.  Safety protocols were in place, including screening questions prior to the visit, additional usage of staff PPE, and extensive cleaning of exam room while observing appropriate contact time as indicated for disinfecting solutions.

## 2019-09-25 NOTE — Progress Notes (Signed)
Please call patient: I have reviewed his/her xray results. Back xrays show significant curvature of the spine and djd changes that are likely contributing to her back pain. Seeing ortho will be needed next as we discussed.  Also, lots of constipation. Can start miralax once or twice a day to see if relieving this will help her lower abdominal symptoms as well. Thanks.

## 2019-09-26 ENCOUNTER — Encounter: Payer: Self-pay | Admitting: Family Medicine

## 2019-09-26 NOTE — Telephone Encounter (Signed)
I spoke with pt to give her message below. She says that she is relieved, and will have a better weekend.

## 2019-09-26 NOTE — Telephone Encounter (Signed)
Patient calling and states that she was supposed to get a call back this morning about what an Aortic atherosclerosis meant. Is a little worried since she has not heard anything back. Please advise. Would like a call back at earliest convenience.

## 2019-09-27 ENCOUNTER — Encounter: Payer: Self-pay | Admitting: Family Medicine

## 2019-09-29 ENCOUNTER — Other Ambulatory Visit: Payer: Self-pay

## 2019-09-29 DIAGNOSIS — M5137 Other intervertebral disc degeneration, lumbosacral region: Secondary | ICD-10-CM

## 2019-09-29 DIAGNOSIS — E890 Postprocedural hypothyroidism: Secondary | ICD-10-CM | POA: Diagnosis not present

## 2019-10-15 ENCOUNTER — Encounter: Payer: Self-pay | Admitting: Orthopaedic Surgery

## 2019-10-15 ENCOUNTER — Ambulatory Visit (INDEPENDENT_AMBULATORY_CARE_PROVIDER_SITE_OTHER): Payer: Medicare Other | Admitting: Orthopaedic Surgery

## 2019-10-15 VITALS — BP 127/66 | HR 60 | Ht 60.0 in | Wt 170.0 lb

## 2019-10-15 DIAGNOSIS — G8929 Other chronic pain: Secondary | ICD-10-CM | POA: Diagnosis not present

## 2019-10-15 DIAGNOSIS — M545 Low back pain: Secondary | ICD-10-CM

## 2019-10-15 NOTE — Progress Notes (Signed)
Office Visit Note   Patient: Martha Gilmore           Date of Birth: 1940-10-12           MRN: [id number] Visit Date: 10/15/2019              Requested by: Leamon Arnt, [address] [place],  [place] [id number] PCP: Leamon Arnt, MD   Assessment & Plan: Visit Diagnoses:  1. Chronic bilateral low back pain, unspecified whether sciatica present     Plan: Patient's had in a short period of 2 years significant progression of scoliosis in the lumbar region most significant at L4-5 L5-S1 with facet arthropathy.  With curvature is difficult to tell if she has had endplate fractures or may be intact endplates.  She is able considerable back pain and I recommend an MRI scan to rule out stenosis or facet cyst causing compression with her neurogenic claudication symptoms.  Follow-up after MRI scan.  Follow-Up Instructions: return after MRI  Orders:  Orders Placed This Encounter  Procedures  . MR Lumbar Spine w/o contrast   No orders of the defined types were placed in this encounter.     Procedures: No procedures performed   Clinical Data: No additional findings.   Subjective: Chief Complaint  Patient presents with  . Lower Back - Pain    HPI 79 year old female seen with chronic back pain she states she has noticed she is getting shorter and is shrinking.  She has pain at the lumbosacral junction she has gone to chiropractor who then recommended she see her PCP.  She has tried diclofenac rest ice all without relief.  She can only do something for about an hour or an hour and half and has to stop to sit lay down.  She can walk 1-2 blocks.  She is improved with sitting.  X-rays 09/25/2019 showed marked progression of curvature worse at L4-5 and L5-S1 with disc space height eccentric to the left at L2-3 and L3-4.  There is significant progression from on the end of the images 2 years earlier.  Review of Systems positive history of Graves' disease GERD disc degeneration,  scoliosis.  Knee chondromalacia.   Objective: Vital Signs: BP 127/66   Pulse 60   Ht 5' (1.524 m)   Wt 170 lb (77.1 kg)   BMI 33.20 kg/m   Physical Exam Constitutional:      Appearance: She is well-developed.  HENT:     Head: Normocephalic.     Right Ear: External ear normal.     Left Ear: External ear normal.  Eyes:     Pupils: Pupils are equal, round, and reactive to light.  Neck:     Thyroid: No thyromegaly.     Trachea: No tracheal deviation.  Cardiovascular:     Rate and Rhythm: Normal rate.  Pulmonary:     Effort: Pulmonary effort is normal.  Abdominal:     Palpations: Abdomen is soft.  Skin:    General: Skin is warm and dry.  Neurological:     Mental Status: She is alert and oriented to person, place, and time.  Psychiatric:        Behavior: Behavior normal.     Ortho Exam patient has intact reflexes negative logroll the hips.  She has some sciatic notch tenderness anterior tib gastrocsoleus is intact she is able to ambulate.  Specialty Comments:  No specialty comments available.  Imaging: No results found.   PMFS History:  Patient Active Problem List   Diagnosis Date Noted  . Chondromalacia, knee, right 11/05/2017  . Osteoarthrosis, localized, primary, knee, right 11/05/2017  . Major depression, chronic 04/16/2017  . Mixed hyperlipidemia 04/16/2017  . Clostridium difficile infection 04/16/2017  . Family history of early CAD 04/16/2017  . Bilateral sensorineural hearing loss 02/06/2016  . Postablative hypothyroidism 12/20/2015  . History of Graves' disease 03/22/2015  . OAB (overactive bladder) 12/16/2013  . GERD (gastroesophageal reflux disease) 12/13/2012  . Osteoarthritis of [place] joint of thumb 08/13/2012  . Seasonal allergies 08/13/2012  . Urge and stress incontinence 08/13/2012  . Osteopenia 08/05/2012  . DDD (degenerative disc disease), lumbosacral 10/22/2010   Past Medical History:  Diagnosis Date  . Cataracts, bilateral 09/23/2015  .  Clostridium difficile infection 04/16/2017   H/o C.diff due to clindamicyin 09/2016; two rounds of treatment for cure. Dr. Benson [place]  . Depression   . Hyperlipidemia   . Hypertension   . OAB (overactive bladder) 12/16/2013  . Osteopenia 08/05/2012   Dexa scan 07/2012 mild osteopenia, T = -1.1  . Seasonal allergies   . Urge and stress incontinence     Family History  Problem Relation Age of Onset  . Heart disease Mother   . Cancer Father   . Heart disease Father   . Lung cancer Sister   . Heart disease Sister   . Diabetes Sister   . Brain cancer Brother   . Heart disease Maternal Grandmother   . Kidney cancer Sister     Past Surgical History:  Procedure Laterality Date  . APPENDECTOMY  1971  . CESAREAN SECTION    . CHOLECYSTECTOMY    . Teeth Implants    . TRIGGER FINGER RELEASE    . VAGINAL HYSTERECTOMY     Social History   Occupational History  . Not on file  Tobacco Use  . Smoking status: Never Smoker  . Smokeless tobacco: Never Used  Vaping Use  . Vaping Use: Never used  Substance and Sexual Activity  . Alcohol use: No  . Drug use: No  . Sexual activity: Never    Partners: Male

## 2019-10-17 ENCOUNTER — Ambulatory Visit
Admission: RE | Admit: 2019-10-17 | Discharge: 2019-10-17 | Disposition: A | Discharge status: Home / Self Care | Payer: Medicare Other | Source: Ambulatory Visit | Attending: Orthopaedic Surgery | Admitting: Orthopaedic Surgery

## 2019-10-17 DIAGNOSIS — G8929 Other chronic pain: Secondary | ICD-10-CM

## 2019-10-17 DIAGNOSIS — M545 Low back pain, unspecified: Secondary | ICD-10-CM

## 2019-10-17 IMAGING — MR MR LUMBAR SPINE W/O CM
4 of 5 series · 24 of 48 positions shown · non-contrast
Comparison: None.

CLINICAL DATA: Low back pain getting worse over the past 8-10 weeks
with no known injury.

EXAM:
MRI LUMBAR SPINE WITHOUT CONTRAST
TECHNIQUE: Multiplanar, multisequence MR imaging of the lumbar spine was
performed. No intravenous contrast was administered.

[Series 2: T2 · sagittal · 4.0mm · 0.55mm/px · 8 of 19 slices shown (1 of 2)]
[im 1/19]
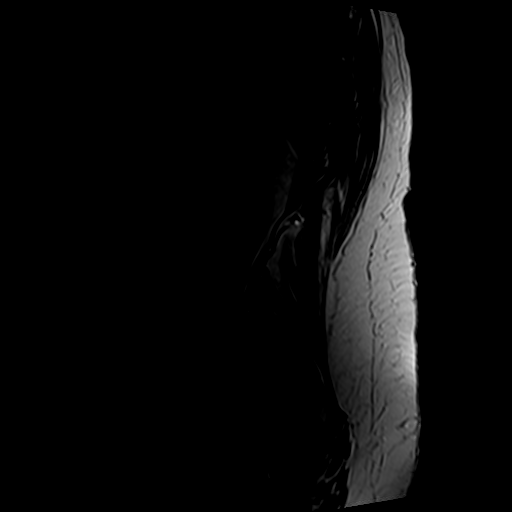
[im 3/19]
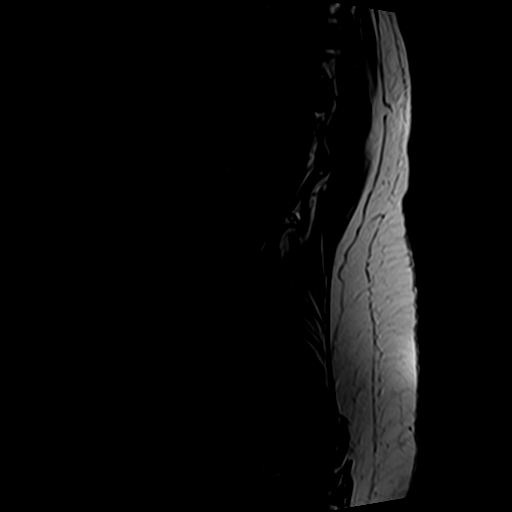
[im 6/19]
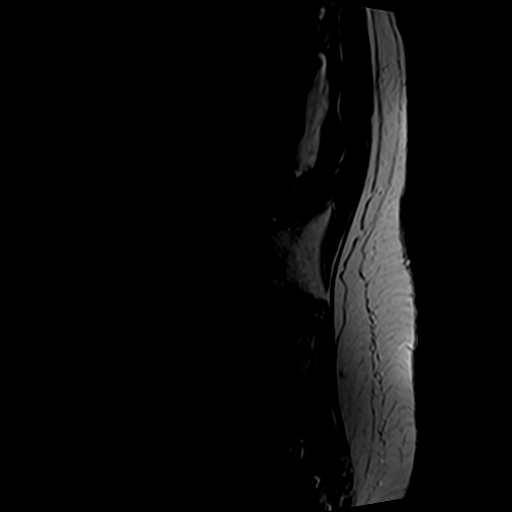
[im 8/19]
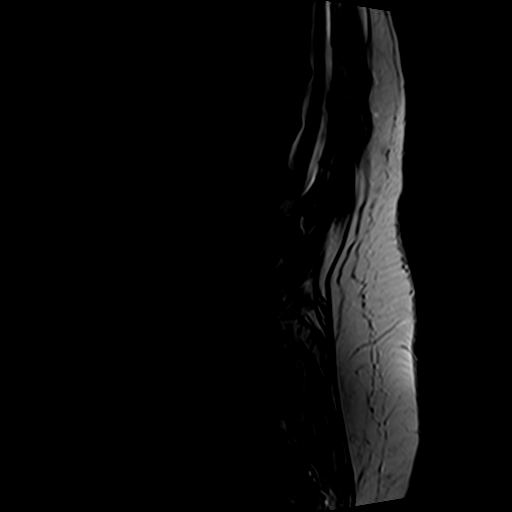
[im 11/19]
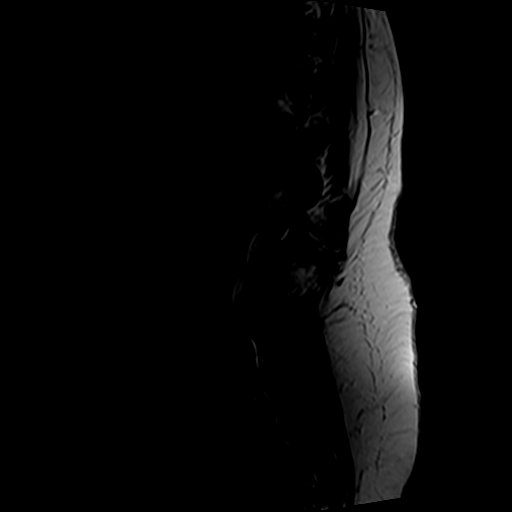
[im 13/19]
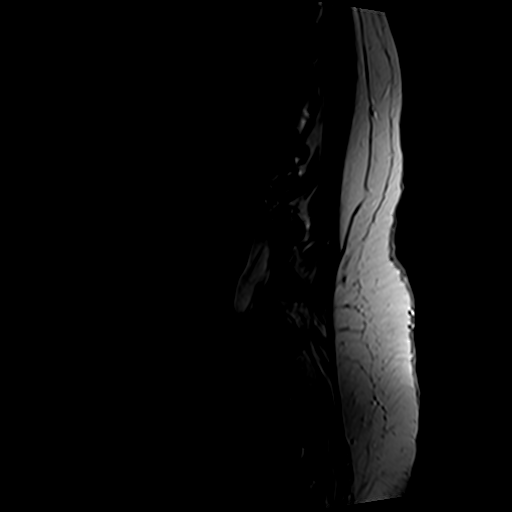
[im 16/19]
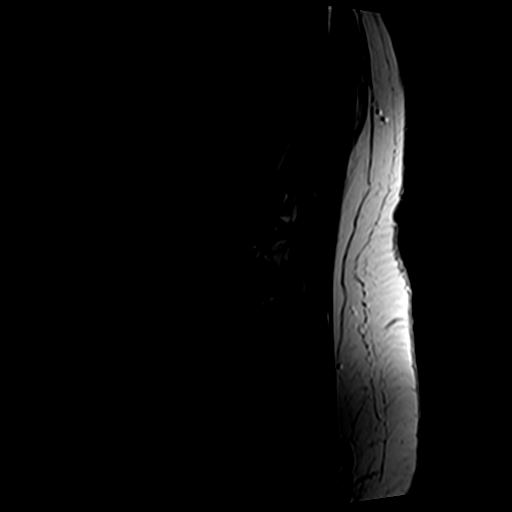
[im 19/19]
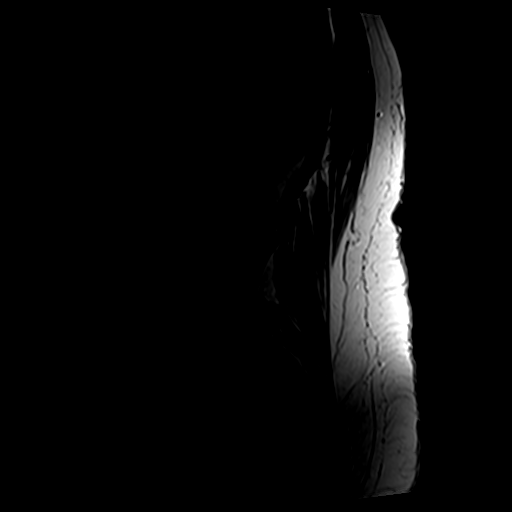

[Series 4: T1 · sagittal · 4.0mm · 0.55mm/px · 4 of 19 slices shown (1 of 2)]
[im 1/19]
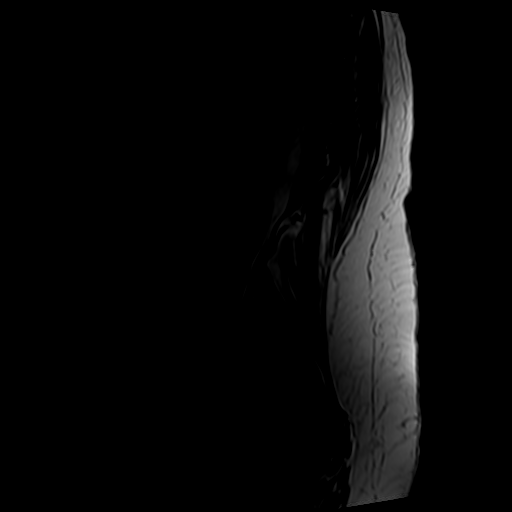
[im 4/19]
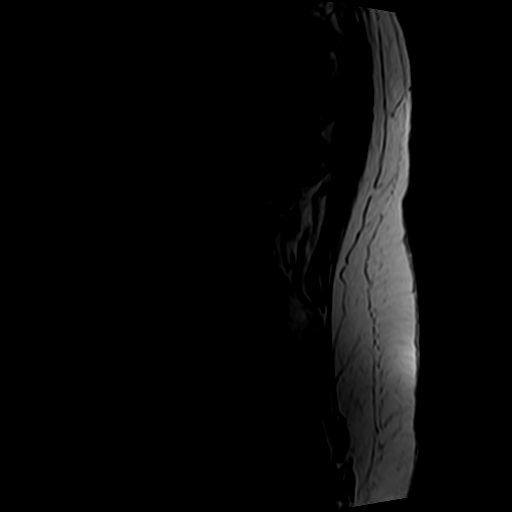
[im 10/19]
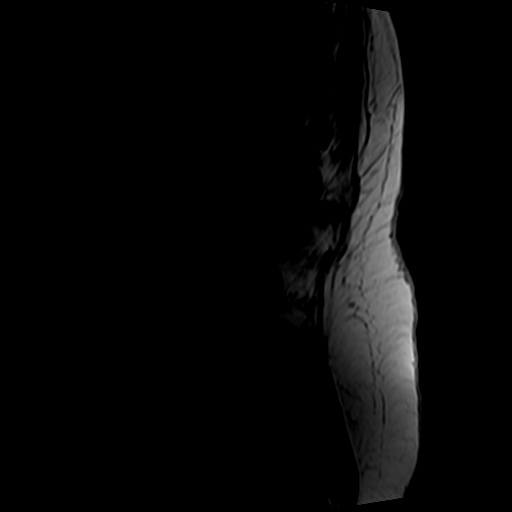
[im 16/19]
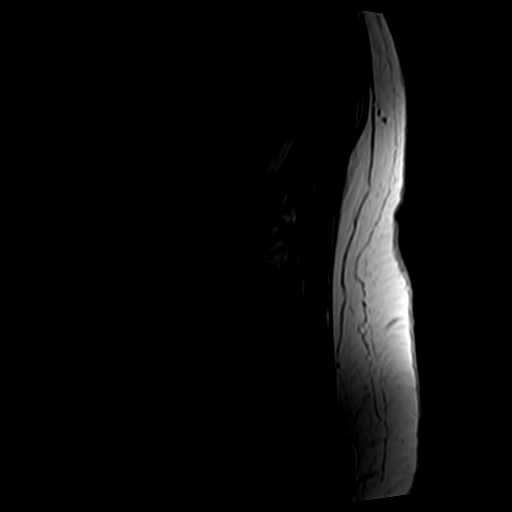

[Series 5: T2 · axial · 4.0mm · 0.70mm/px · z∈[-36,+153]mm · 9 of 35 slices shown (2 of 2)]
[im 1/35]
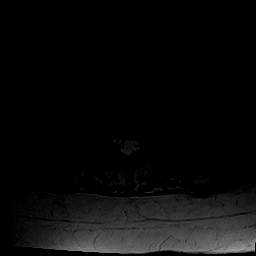
[im 6/35]
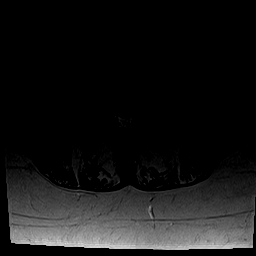
[im 12/35]
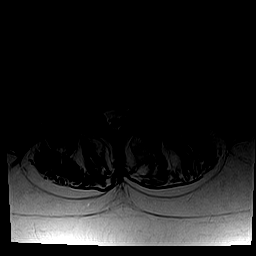
[im 15/35]
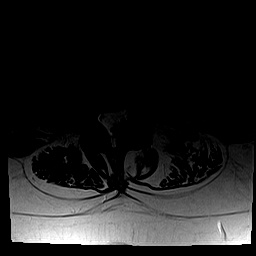
[im 18/35]
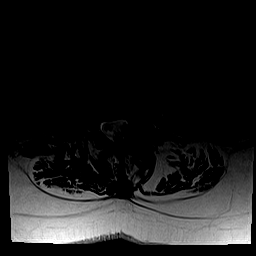
[im 20/35]
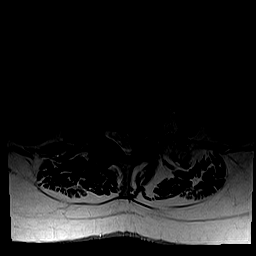
[im 23/35]
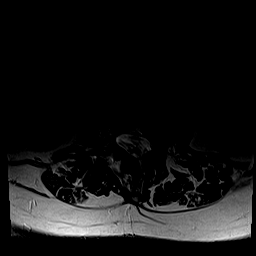
[im 29/35]
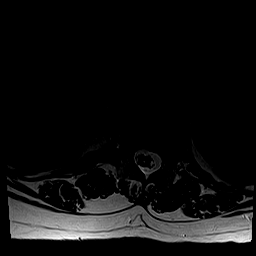
[im 35/35]
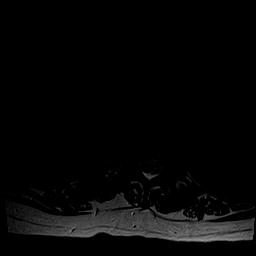

[Series 6: T1 · axial · 4.0mm · 0.35mm/px · z∈[-10,+122]mm · 3 of 35 slices shown (2 of 2)]
[im 6/35]
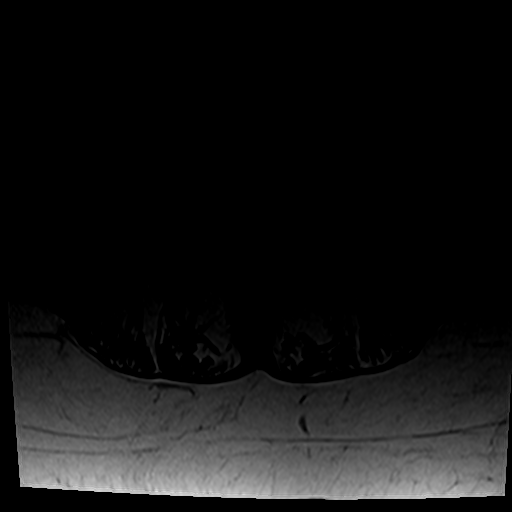
[im 18/35]
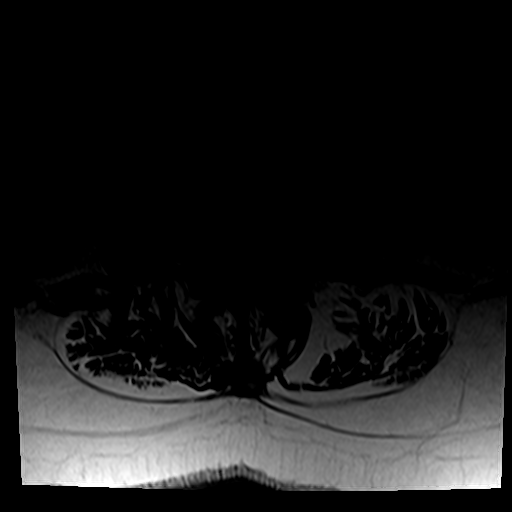
[im 29/35]
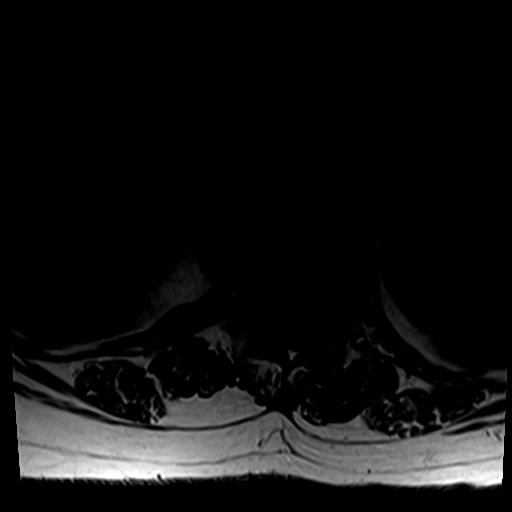

[24 of 48 positions shown; findings below may reference images not displayed]

FINDINGS: Segmentation:  Standard.

Alignment: No scoliosis of the lumbar spine. 2 mm retrolisthesis of
L2 on L3. Grade 1 anterolisthesis of L4 on L5 secondary to facet
disease.

Vertebrae:  No fracture, evidence of discitis, or bone lesion.

Conus medullaris and cauda equina: Conus extends to the L1 level.
Conus and cauda equina appear normal.

Paraspinal and other soft tissues: No acute paraspinal abnormality.

Disc levels:

Disc spaces: Degenerative disease with disc height loss throughout
the lumbar spine with relative sparing at L4-5. Discogenic changes
at L1-2.

T11-12: Mild broad-based disc bulge. Mild bilateral facet
arthropathy. Mild right foraminal narrowing. No left foraminal
narrowing.

T12-L1: Minimal broad-based disc bulge. No evidence of neural
foraminal stenosis. No central canal stenosis.

L1-L2: Broad-based disc bulge. Mild bilateral facet arthropathy.
Moderate right and mild left foraminal stenosis. Bilateral
subarticular recess narrowing. No spinal stenosis.

L2-L3: Broad-based disc osteophyte complex. Moderate left and mild
right facet arthropathy. Severe left subarticular recess stenosis.
Severe left foraminal stenosis. Scratch them moderate left foraminal
stenosis. No right foraminal stenosis. No central canal stenosis.

L3-L4: Partial ankylosis along the left side of the L3-4 disc with
osseous ridging along the foraminal aspect. Mild bilateral facet
arthropathy. Mild left foraminal stenosis. No right foraminal
stenosis. No evidence of neural foraminal stenosis. No central canal
stenosis. Left lateral recess stenosis.

L4-L5: Broad-based disc bulge. Severe bilateral facet arthropathy
with ligamentum flavum infolding. Moderate spinal stenosis.
Bilateral lateral recess stenosis. Mild bilateral foraminal
stenosis.

L5-S1: Broad-based disc bulge. Moderate bilateral facet arthropathy.
Mild spinal stenosis. Bilateral lateral recess stenosis. Moderate
bilateral foraminal stenosis.
IMPRESSION: 1. Diffuse lumbar spine spondylosis as described above.
2. No acute osseous injury of the lumbar spine.

## 2019-10-29 ENCOUNTER — Encounter: Payer: Self-pay | Admitting: Orthopaedic Surgery

## 2019-10-29 ENCOUNTER — Encounter: Payer: Self-pay | Admitting: Family Medicine

## 2019-10-31 MED ORDER — TRAMADOL HCL 50 MG PO TABS: 50.0000 mg | ORAL_TABLET | Freq: Two times a day (BID) | ORAL | 0 refills | Status: DC | PRN

## 2019-11-05 ENCOUNTER — Encounter: Payer: Self-pay | Admitting: Orthopaedic Surgery

## 2019-11-05 ENCOUNTER — Ambulatory Visit (INDEPENDENT_AMBULATORY_CARE_PROVIDER_SITE_OTHER): Payer: Medicare Other | Admitting: Orthopaedic Surgery

## 2019-11-05 DIAGNOSIS — M545 Low back pain, unspecified: Secondary | ICD-10-CM

## 2019-11-05 DIAGNOSIS — M5137 Other intervertebral disc degeneration, lumbosacral region: Secondary | ICD-10-CM

## 2019-11-05 DIAGNOSIS — G8929 Other chronic pain: Secondary | ICD-10-CM | POA: Diagnosis not present

## 2019-11-05 NOTE — Progress Notes (Addendum)
Office Visit Note   Patient: Martha Gilmore           Date of Birth: 11/26/1940           MRN: [id number] Visit Date: 11/05/2019              Requested by: Leamon Arnt, [place],  [place] [id number] PCP: Leamon Arnt, MD   Assessment & Plan: Visit Diagnoses:  1. Chronic bilateral low back pain, unspecified whether sciatica present   2. DDD (degenerative disc disease), lumbosacral     Plan: Today's visit we reviewed with the patient and her daughter plain x-rays that showed the scoliosis disc degeneration and facet arthropathy.  Reviewed the MRI scan which unfortunately shows multilevel changes with changes of mild to moderate and some areas of severe left at L2-3 and L3-4 which do not exactly correspond with her symptoms.  We discussed correcting  the curvature is a multilevel problem with potential for adjacent level collapse and need for extension of the fusion.  She does not have severe central stenosis at this point.  We offered epidural injection and then follow-up.  We discussed multilevel procedure would probably be best done at a tertiary referral [facility] were scheduled and she would like to proceed to get some pain relief.   Follow-Up Instructions: Return in about 2 months (around 01/06/2020).   Orders:  Orders Placed This Encounter  Procedures  . Ambulatory referral to Physical Medicine Rehab   No orders of the defined types were placed in this encounter.     Procedures: No procedures performed   Clinical Data: No additional findings.   Subjective: Chief Complaint  Patient presents with  . Lower Back - Pain, Follow-up    HPI 79 year old female with scoliosis marked, left convex with disc degeneration worse at L2-3 L3-4 returns and here with her daughter with ongoing problems with back pain.  She had t tramadol sent in 10/31/19 but has not been taking it and has had MRI scan available for review.  Bone density  test in the past showed some borderline osteopenia.  Patient has multilevel changes with moderate right and mild left facet arthropathy at L2-3.  Severe subarticular stenosis on the left at L2-3 and severe foraminal stenosis.  Right side looks open.  L3-4 shows moderate to severe foraminal stenosis and lateral recess stenosis.  Disc bulge and arthritis with moderate central stenosis at L4-5.  Mild lateral recess stenosis at L5-S1.  Patient continued to have significant problems with back pain.  Patient is tried anti-inflammatories, ice rest without relief.  She can walk 2 blocks she gets some improvement with sitting.    Review of Systems use systems unchanged from 10/15/2019 office visit other than as mentioned in HPI.   Objective: Vital Signs: There were no vitals taken for this visit.  Physical Exam Constitutional:      Appearance: She is well-developed.  HENT:     Head: Normocephalic.     Right Ear: External ear normal.     Left Ear: External ear normal.  Eyes:     Pupils: Pupils are equal, round, and reactive to light.  Neck:     Thyroid: No thyromegaly.     Trachea: No tracheal deviation.  Cardiovascular:     Rate and Rhythm: Normal rate.  Pulmonary:     Effort: Pulmonary effort is normal.  Abdominal:     Palpations: Abdomen is soft.  Skin:  General: Skin is warm and dry.  Neurological:     Mental Status: She is alert and oriented to person, place, and time.  Psychiatric:        Behavior: Behavior normal.     Ortho Exam patient is amatory negative logroll to the hips.  No atrophy no rash over exposed skin.  Specialty Comments:  No specialty comments available.  Imaging: CLINICAL DATA:  Low back pain getting worse over the past 8-10 weeks with no known injury.  EXAM: MRI LUMBAR SPINE WITHOUT CONTRAST  TECHNIQUE: Multiplanar, multisequence MR imaging of the lumbar spine was performed. No intravenous contrast was administered.  COMPARISON:   None.  FINDINGS: Segmentation:  Standard.  Alignment: No scoliosis of the lumbar spine. 2 mm retrolisthesis of L2 on L3. Grade 1 anterolisthesis of L4 on L5 secondary to facet disease.  Vertebrae:  No fracture, evidence of discitis, or bone lesion.  Conus medullaris and cauda equina: Conus extends to the L1 level. Conus and cauda equina appear normal.  Paraspinal and other soft tissues: No acute paraspinal abnormality.  Disc levels:  Disc spaces: Degenerative disease with disc height loss throughout the lumbar spine with relative sparing at L4-5. Discogenic changes at L1-2.  T11-12: Mild broad-based disc bulge. Mild bilateral facet arthropathy. Mild right foraminal narrowing. No left foraminal narrowing.  T12-L1: Minimal broad-based disc bulge. No evidence of neural foraminal stenosis. No central canal stenosis.  L1-L2: Broad-based disc bulge. Mild bilateral facet arthropathy. Moderate right and mild left foraminal stenosis. Bilateral subarticular recess narrowing. No spinal stenosis.  L2-L3: Broad-based disc osteophyte complex. Moderate left and mild right facet arthropathy. Severe left subarticular recess stenosis. Severe left foraminal stenosis. Scratch them moderate left foraminal stenosis. No right foraminal stenosis. No central canal stenosis.  L3-L4: Partial ankylosis along the left side of the L3-4 disc with osseous ridging along the foraminal aspect. Mild bilateral facet arthropathy. Mild left foraminal stenosis. No right foraminal stenosis. No evidence of neural foraminal stenosis. No central canal stenosis. Left lateral recess stenosis.  L4-L5: Broad-based disc bulge. Severe bilateral facet arthropathy with ligamentum flavum infolding. Moderate spinal stenosis. Bilateral lateral recess stenosis. Mild bilateral foraminal stenosis.  L5-S1: Broad-based disc bulge. Moderate bilateral facet arthropathy. Mild spinal stenosis. Bilateral lateral  recess stenosis. Moderate bilateral foraminal stenosis.  IMPRESSION: 1. Diffuse lumbar spine spondylosis as described above. 2. No acute osseous injury of the lumbar spine.   Electronically Signed   By: Kathreen Devoid   On: 10/18/2019 10:43   PMFS History: Patient Active Problem List   Diagnosis Date Noted  . Chondromalacia, knee, right 11/05/2017  . Osteoarthrosis, localized, primary, knee, right 11/05/2017  . Major depression, chronic 04/16/2017  . Mixed hyperlipidemia 04/16/2017  . Clostridium difficile infection 04/16/2017  . Family history of early CAD 04/16/2017  . Bilateral sensorineural hearing loss 02/06/2016  . Postablative hypothyroidism 12/20/2015  . History of Graves' disease 03/22/2015  . OAB (overactive bladder) 12/16/2013  . GERD (gastroesophageal reflux disease) 12/13/2012  . Osteoarthritis of [place] joint of thumb 08/13/2012  . Seasonal allergies 08/13/2012  . Urge and stress incontinence 08/13/2012  . Osteopenia 08/05/2012  . DDD (degenerative disc disease), lumbosacral 10/22/2010   Past Medical History:  Diagnosis Date  . Cataracts, bilateral 09/23/2015  . Clostridium difficile infection 04/16/2017   H/o C.diff due to clindamicyin 09/2016; two rounds of treatment for cure. Dr. Benson Norway  . Depression   . Hyperlipidemia   . Hypertension   . OAB (overactive bladder) 12/16/2013  . Osteopenia 08/05/2012  Dexa scan 07/2012 mild osteopenia, T = -1.1  . Seasonal allergies   . Urge and stress incontinence     Family History  Problem Relation Age of Onset  . Heart disease Mother   . Cancer Father   . Heart disease Father   . Lung cancer Sister   . Heart disease Sister   . Diabetes Sister   . Brain cancer Brother   . Heart disease Maternal Grandmother   . Kidney cancer Sister     Past Surgical History:  Procedure Laterality Date  . APPENDECTOMY  1971  . CESAREAN SECTION    . CHOLECYSTECTOMY    . Teeth Implants    . TRIGGER FINGER RELEASE    .  VAGINAL HYSTERECTOMY     Social History   Occupational History  . Not on file  Tobacco Use  . Smoking status: Never Smoker  . Smokeless tobacco: Never Used  Vaping Use  . Vaping Use: Never used  Substance and Sexual Activity  . Alcohol use: No  . Drug use: No  . Sexual activity: Never    Partners: Male

## 2019-11-11 ENCOUNTER — Other Ambulatory Visit: Payer: Medicare Other

## 2019-11-18 ENCOUNTER — Ambulatory Visit: Payer: Medicare Other | Admitting: Orthopaedic Surgery

## 2019-11-24 ENCOUNTER — Encounter: Payer: Self-pay | Admitting: Physical Medicine and Rehabilitation

## 2019-11-24 ENCOUNTER — Other Ambulatory Visit: Payer: Self-pay

## 2019-11-24 ENCOUNTER — Ambulatory Visit: Payer: Self-pay

## 2019-11-24 ENCOUNTER — Ambulatory Visit (INDEPENDENT_AMBULATORY_CARE_PROVIDER_SITE_OTHER): Payer: Medicare Other | Admitting: Physical Medicine and Rehabilitation

## 2019-11-24 VITALS — BP 117/74 | HR 78

## 2019-11-24 DIAGNOSIS — M48062 Spinal stenosis, lumbar region with neurogenic claudication: Secondary | ICD-10-CM | POA: Diagnosis not present

## 2019-11-24 DIAGNOSIS — M419 Scoliosis, unspecified: Secondary | ICD-10-CM

## 2019-11-24 MED ORDER — METHYLPREDNISOLONE ACETATE 80 MG/ML IJ SUSP
40.0000 mg | Freq: Once | INTRAMUSCULAR | Status: AC
  Administered 2019-11-24: 40 mg

## 2019-11-24 NOTE — Procedures (Signed)
Lumbosacral Transforaminal Epidural Steroid Injection - Sub-Pedicular Approach with Fluoroscopic Guidance  Patient: Martha Gilmore      Date of Birth: 08/29/1940 MRN: [id number] PCP: Leamon Arnt, MD      Visit Date: 11/24/2019   Universal Protocol:    Date/Time: 11/24/2019  Consent Given By: the patient  Position: PRONE  Additional Comments: Vital signs were monitored before and after the procedure. Patient was prepped and draped in the usual sterile fashion. The correct patient, procedure, and site was verified.   Injection Procedure Details:  Procedure Site One Meds Administered:  Meds ordered this encounter  Medications  . methylPREDNISolone acetate (DEPO-MEDROL) injection 40 mg    Laterality: Bilateral  Location/Site:  L4-L5  Needle size: 22 G  Needle type: Spinal  Needle Placement: Transforaminal  Findings:    -Comments: Excellent flow of contrast along the nerve, nerve root and into the epidural space.  Procedure Details: After squaring off the end-plates to get a true AP view, the C-arm was positioned so that an oblique view of the foramen as noted above was visualized. The target area is just inferior to the "nose of the scotty dog" or sub pedicular. The soft tissues overlying this structure were infiltrated with 2-3 ml. of 1% Lidocaine without Epinephrine.  The spinal needle was inserted toward the target using a "trajectory" view along the fluoroscope beam.  Under AP and lateral visualization, the needle was advanced so it did not puncture dura and was located close the 6 O'Clock position of the pedical in AP tracterory. Biplanar projections were used to confirm position. Aspiration was confirmed to be negative for CSF and/or blood. A 1-2 ml. volume of Isovue-250 was injected and flow of contrast was noted at each level. Radiographs were obtained for documentation purposes.   After attaining the desired flow of contrast documented above, a 0.5 to 1.0 ml  test dose of 0.25% Marcaine was injected into each respective transforaminal space.  The patient was observed for 90 seconds post injection.  After no sensory deficits were reported, and normal lower extremity motor function was noted,   the above injectate was administered so that equal amounts of the injectate were placed at each foramen (level) into the transforaminal epidural space.   Additional Comments:  The patient tolerated the procedure well Dressing: 2 x 2 sterile gauze and Band-Aid    Post-procedure details: Patient was observed during the procedure. Post-procedure instructions were reviewed.  Patient left the clinic in stable condition.

## 2019-11-24 NOTE — Progress Notes (Signed)
Martha Gilmore - 79 y.o. female MRN [id number]  Date of birth: Jul 22, 1940  Office Visit Note: Visit Date: 11/24/2019 PCP: Leamon Arnt, MD Referred by: Leamon Arnt, MD  Subjective: No chief complaint on file.  HPI:  Martha Gilmore is a 79 y.o. female who comes in today at the request of Dr. Rodell Perna for planned Bilateral L4-L5 Lumbar epidural steroid injection with fluoroscopic guidance.  The patient has failed conservative care including home exercise, medications, time and activity modification.  This injection will be diagnostic and hopefully therapeutic.  Please see requesting physician notes for further details and justification.   ROS Otherwise per HPI.  Assessment & Plan: Visit Diagnoses:  1. Spinal stenosis of lumbar region with neurogenic claudication   2. Scoliosis of thoracolumbar spine, unspecified scoliosis type     Plan: No additional findings.   Meds & Orders:  Meds ordered this encounter  Medications  . methylPREDNISolone acetate (DEPO-MEDROL) injection 40 mg    Orders Placed This Encounter  Procedures  . XR C-ARM NO REPORT  . Epidural Steroid injection    Follow-up: No follow-ups on file.   Procedures: No procedures performed  Lumbosacral Transforaminal Epidural Steroid Injection - Sub-Pedicular Approach with Fluoroscopic Guidance  Patient: Martha Gilmore      Date of Birth: 1940-09-19 MRN: [id number] PCP: Leamon Arnt, MD      Visit Date: 11/24/2019   Universal Protocol:    Date/Time: 11/24/2019  Consent Given By: the patient  Position: PRONE  Additional Comments: Vital signs were monitored before and after the procedure. Patient was prepped and draped in the usual sterile fashion. The correct patient, procedure, and site was verified.   Injection Procedure Details:  Procedure Site One Meds Administered:  Meds ordered this encounter  Medications  . methylPREDNISolone acetate (DEPO-MEDROL) injection 40 mg    Laterality:  Bilateral  Location/Site:  L4-L5  Needle size: 22 G  Needle type: Spinal  Needle Placement: Transforaminal  Findings:    -Comments: Excellent flow of contrast along the nerve, nerve root and into the epidural space.  Procedure Details: After squaring off the end-plates to get a true AP view, the C-arm was positioned so that an oblique view of the foramen as noted above was visualized. The target area is just inferior to the "nose of the scotty dog" or sub pedicular. The soft tissues overlying this structure were infiltrated with 2-3 ml. of 1% Lidocaine without Epinephrine.  The spinal needle was inserted toward the target using a "trajectory" view along the fluoroscope beam.  Under AP and lateral visualization, the needle was advanced so it did not puncture dura and was located close the 6 O'Clock position of the pedical in AP tracterory. Biplanar projections were used to confirm position. Aspiration was confirmed to be negative for CSF and/or blood. A 1-2 ml. volume of Isovue-250 was injected and flow of contrast was noted at each level. Radiographs were obtained for documentation purposes.   After attaining the desired flow of contrast documented above, a 0.5 to 1.0 ml test dose of 0.25% Marcaine was injected into each respective transforaminal space.  The patient was observed for 90 seconds post injection.  After no sensory deficits were reported, and normal lower extremity motor function was noted,   the above injectate was administered so that equal amounts of the injectate were placed at each foramen (level) into the transforaminal epidural space.   Additional Comments:  The patient tolerated the procedure well Dressing: 2 x 2  sterile gauze and Band-Aid    Post-procedure details: Patient was observed during the procedure. Post-procedure instructions were reviewed.  Patient left the clinic in stable condition.      Clinical History: CLINICAL DATA:  Low back pain getting  worse over the past 8-10 weeks with no known injury.  EXAM: MRI LUMBAR SPINE WITHOUT CONTRAST  TECHNIQUE: Multiplanar, multisequence MR imaging of the lumbar spine was performed. No intravenous contrast was administered.  COMPARISON:  None.  FINDINGS: Segmentation:  Standard.  Alignment: No scoliosis of the lumbar spine. 2 mm retrolisthesis of L2 on L3. Grade 1 anterolisthesis of L4 on L5 secondary to facet disease.  Vertebrae:  No fracture, evidence of discitis, or bone lesion.  Conus medullaris and cauda equina: Conus extends to the L1 level. Conus and cauda equina appear normal.  Paraspinal and other soft tissues: No acute paraspinal abnormality.  Disc levels:  Disc spaces: Degenerative disease with disc height loss throughout the lumbar spine with relative sparing at L4-5. Discogenic changes at L1-2.  T11-12: Mild broad-based disc bulge. Mild bilateral facet arthropathy. Mild right foraminal narrowing. No left foraminal narrowing.  T12-L1: Minimal broad-based disc bulge. No evidence of neural foraminal stenosis. No central canal stenosis.  L1-L2: Broad-based disc bulge. Mild bilateral facet arthropathy. Moderate right and mild left foraminal stenosis. Bilateral subarticular recess narrowing. No spinal stenosis.  L2-L3: Broad-based disc osteophyte complex. Moderate left and mild right facet arthropathy. Severe left subarticular recess stenosis. Severe left foraminal stenosis. Scratch them moderate left foraminal stenosis. No right foraminal stenosis. No central canal stenosis.  L3-L4: Partial ankylosis along the left side of the L3-4 disc with osseous ridging along the foraminal aspect. Mild bilateral facet arthropathy. Mild left foraminal stenosis. No right foraminal stenosis. No evidence of neural foraminal stenosis. No central canal stenosis. Left lateral recess stenosis.  L4-L5: Broad-based disc bulge. Severe bilateral facet  arthropathy with ligamentum flavum infolding. Moderate spinal stenosis. Bilateral lateral recess stenosis. Mild bilateral foraminal stenosis.  L5-S1: Broad-based disc bulge. Moderate bilateral facet arthropathy. Mild spinal stenosis. Bilateral lateral recess stenosis. Moderate bilateral foraminal stenosis.  IMPRESSION: 1. Diffuse lumbar spine spondylosis as described above. 2. No acute osseous injury of the lumbar spine.   Electronically Signed   By: Kathreen Devoid   On: 10/18/2019 10:43     Objective:  VS:  HT:    WT:   BMI:     BP:117/74  HR:78bpm  TEMP: ( )  RESP:  Physical Exam Constitutional:      General: She is not in acute distress.    Appearance: Normal appearance. She is not ill-appearing.  HENT:     Head: Normocephalic and atraumatic.     Right Ear: External ear normal.     Left Ear: External ear normal.  Eyes:     Extraocular Movements: Extraocular movements intact.  Cardiovascular:     Rate and Rhythm: Normal rate.     Pulses: Normal pulses.  Musculoskeletal:     Right lower leg: No edema.     Left lower leg: No edema.     Comments: Patient has good distal strength with no pain over the greater trochanters.  No clonus or focal weakness.  Skin:    Findings: No erythema, lesion or rash.  Neurological:     General: No focal deficit present.     Mental Status: She is alert and oriented to person, place, and time.     Sensory: No sensory deficit.     Motor: No weakness  or abnormal muscle tone.     Coordination: Coordination normal.  Psychiatric:        Mood and Affect: Mood normal.        Behavior: Behavior normal.      Imaging: No results found.

## 2019-11-24 NOTE — Progress Notes (Signed)
Pt states lower back pain. Pt states being up walking or doing anything makes her pain worse. Pt states ocing or heating pad makes it feel better.  Numeric Pain Rating Scale and Functional Assessment Average Pain 7   In the last MONTH (on 0-10 scale) has pain interfered with the following?  1. General activity like being  able to carry out your everyday physical activities such as walking, climbing stairs, carrying groceries, or moving a chair?  Rating(10)   +Driver, -BT, -Dye Allergies.

## 2019-12-01 ENCOUNTER — Encounter: Payer: Self-pay | Admitting: Physical Medicine and Rehabilitation

## 2019-12-04 DIAGNOSIS — M9901 Segmental and somatic dysfunction of cervical region: Secondary | ICD-10-CM | POA: Diagnosis not present

## 2019-12-04 DIAGNOSIS — M5136 Other intervertebral disc degeneration, lumbar region: Secondary | ICD-10-CM | POA: Diagnosis not present

## 2019-12-04 DIAGNOSIS — M9903 Segmental and somatic dysfunction of lumbar region: Secondary | ICD-10-CM | POA: Diagnosis not present

## 2019-12-04 DIAGNOSIS — M9902 Segmental and somatic dysfunction of thoracic region: Secondary | ICD-10-CM | POA: Diagnosis not present

## 2019-12-04 DIAGNOSIS — M5032 Other cervical disc degeneration, mid-cervical region, unspecified level: Secondary | ICD-10-CM | POA: Diagnosis not present

## 2019-12-04 DIAGNOSIS — S29012A Strain of muscle and tendon of back wall of thorax, initial encounter: Secondary | ICD-10-CM | POA: Diagnosis not present

## 2019-12-08 ENCOUNTER — Other Ambulatory Visit: Payer: Self-pay

## 2019-12-08 ENCOUNTER — Ambulatory Visit (INDEPENDENT_AMBULATORY_CARE_PROVIDER_SITE_OTHER): Payer: Medicare Other | Admitting: Physical Medicine and Rehabilitation

## 2019-12-08 ENCOUNTER — Ambulatory Visit: Payer: Self-pay

## 2019-12-08 ENCOUNTER — Encounter: Payer: Self-pay | Admitting: Physical Medicine and Rehabilitation

## 2019-12-08 VITALS — BP 118/70 | HR 76

## 2019-12-08 DIAGNOSIS — M47816 Spondylosis without myelopathy or radiculopathy, lumbar region: Secondary | ICD-10-CM | POA: Diagnosis not present

## 2019-12-08 MED ORDER — METHYLPREDNISOLONE ACETATE 80 MG/ML IJ SUSP
40.0000 mg | Freq: Once | INTRAMUSCULAR | Status: AC
  Administered 2019-12-08: 40 mg

## 2019-12-08 NOTE — Progress Notes (Signed)
Pt states lower back pain. Pt states doing anything her back has pain. Pt states heating pad helps with the pain. Pt has hx of inj on 11/24/19 pt state it helped a little. Pt states the pain never went away.  Numeric Pain Rating Scale and Functional Assessment Average Pain 6   In the last MONTH (on 0-10 scale) has pain interfered with the following?  1. General activity like being  able to carry out your everyday physical activities such as walking, climbing stairs, carrying groceries, or moving a chair?  Rating(10)   +Driver, -BT, -Dye Allergies.

## 2019-12-09 NOTE — Progress Notes (Signed)
Martha Gilmore - 79 y.o. female MRN [id number]  Date of birth: Dec 28, 1940  Office Visit Note: Visit Date: 12/08/2019 PCP: Leamon Arnt, MD Referred by: Leamon Arnt, MD  Subjective: Chief Complaint  Patient presents with  . Lower Back - Pain   HPI:  Martha Gilmore is a 79 y.o. female who comes in today at the request of Dr. Rodell Perna for planned Bilateral L4-L5 and L5-S1 Lumbar facet/medial branch block with fluoroscopic guidance.  The patient has failed conservative care including home exercise, medications, time and activity modification.  This injection will be diagnostic and hopefully therapeutic.  Please see requesting physician notes for further details and justification.  Exam shows concordant low back pain with facet joint loading and extension.  Initial diagnostic epidural injection was not very successful in relieving her back pain.  She has no radicular pain.  She does have some level of moderate stenosis but does have significant facet arthropathy at L4-5 and L5-S1.    ROS Otherwise per HPI.  Assessment & Plan: Visit Diagnoses:  1. Spondylosis without myelopathy or radiculopathy, lumbar region     Plan: No additional findings.   Meds & Orders:  Meds ordered this encounter  Medications  . methylPREDNISolone acetate (DEPO-MEDROL) injection 40 mg    Orders Placed This Encounter  Procedures  . Facet Injection  . XR C-ARM NO REPORT    Follow-up: Return for Review Pain Diary.   Procedures: No procedures performed  Lumbar Diagnostic Facet Joint Nerve Block with Fluoroscopic Guidance   Patient: Martha Gilmore      Date of Birth: 1940-06-19 MRN: [id number] PCP: Leamon Arnt, MD      Visit Date: 12/08/2019   Universal Protocol:    Date/Time: 08/17/215:36 AM  Consent Given By: the patient  Position: PRONE  Additional Comments: Vital signs were monitored before and after the procedure. Patient was prepped and draped in the usual sterile  fashion. The correct patient, procedure, and site was verified.   Injection Procedure Details:  Procedure Site One Meds Administered:  Meds ordered this encounter  Medications  . methylPREDNISolone acetate (DEPO-MEDROL) injection 40 mg     Laterality: Bilateral  Location/Site:  L4-L5 L5-S1  Needle size: 22 ga.  Needle type:spinal  Needle Placement: Oblique pedical  Findings:   -Comments: There was excellent flow of contrast along the articular pillars without intravascular flow.  Procedure Details: The fluoroscope beam is vertically oriented in AP and then obliqued 15 to 20 degrees to the ipsilateral side of the desired nerve to achieve the "Scotty dog" appearance.  The skin over the target area of the junction of the superior articulating process and the transverse process (sacral ala if blocking the L5 dorsal rami) was locally anesthetized with a 1 ml volume of 1% Lidocaine without Epinephrine.  The spinal needle was inserted and advanced in a trajectory view down to the target.   After contact with periosteum and negative aspirate for blood and CSF, correct placement without intravascular or epidural spread was confirmed by injecting 0.5 ml. of Isovue-250.  A spot radiograph was obtained of this image.    Next, a 0.5 ml. volume of the injectate described above was injected. The needle was then redirected to the other facet joint nerves mentioned above if needed.  Prior to the procedure, the patient was given a Pain Diary which was completed for baseline measurements.  After the procedure, the patient rated their pain every 30 minutes and will continue rating at this  frequency for a total of 5 hours.  The patient has been asked to complete the Diary and return to [place] by mail, fax or hand delivered as soon as possible.   Additional Comments:  The patient tolerated the procedure well Dressing: 2 x 2 sterile gauze and Band-Aid    Post-procedure details: Patient was observed  during the procedure. Post-procedure instructions were reviewed.  Patient left the clinic in stable condition.    Clinical History: CLINICAL DATA:  Low back pain getting worse over the past 8-10 weeks with no known injury.  EXAM: MRI LUMBAR SPINE WITHOUT CONTRAST  TECHNIQUE: Multiplanar, multisequence MR imaging of the lumbar spine was performed. No intravenous contrast was administered.  COMPARISON:  None.  FINDINGS: Segmentation:  Standard.  Alignment: No scoliosis of the lumbar spine. 2 mm retrolisthesis of L2 on L3. Grade 1 anterolisthesis of L4 on L5 secondary to facet disease.  Vertebrae:  No fracture, evidence of discitis, or bone lesion.  Conus medullaris and cauda equina: Conus extends to the L1 level. Conus and cauda equina appear normal.  Paraspinal and other soft tissues: No acute paraspinal abnormality.  Disc levels:  Disc spaces: Degenerative disease with disc height loss throughout the lumbar spine with relative sparing at L4-5. Discogenic changes at L1-2.  T11-12: Mild broad-based disc bulge. Mild bilateral facet arthropathy. Mild right foraminal narrowing. No left foraminal narrowing.  T12-L1: Minimal broad-based disc bulge. No evidence of neural foraminal stenosis. No central canal stenosis.  L1-L2: Broad-based disc bulge. Mild bilateral facet arthropathy. Moderate right and mild left foraminal stenosis. Bilateral subarticular recess narrowing. No spinal stenosis.  L2-L3: Broad-based disc osteophyte complex. Moderate left and mild right facet arthropathy. Severe left subarticular recess stenosis. Severe left foraminal stenosis. Scratch them moderate left foraminal stenosis. No right foraminal stenosis. No central canal stenosis.  L3-L4: Partial ankylosis along the left side of the L3-4 disc with osseous ridging along the foraminal aspect. Mild bilateral facet arthropathy. Mild left foraminal stenosis. No right  foraminal stenosis. No evidence of neural foraminal stenosis. No central canal stenosis. Left lateral recess stenosis.  L4-L5: Broad-based disc bulge. Severe bilateral facet arthropathy with ligamentum flavum infolding. Moderate spinal stenosis. Bilateral lateral recess stenosis. Mild bilateral foraminal stenosis.  L5-S1: Broad-based disc bulge. Moderate bilateral facet arthropathy. Mild spinal stenosis. Bilateral lateral recess stenosis. Moderate bilateral foraminal stenosis.  IMPRESSION: 1. Diffuse lumbar spine spondylosis as described above. 2. No acute osseous injury of the lumbar spine.   Electronically Signed   By: Kathreen Devoid   On: 10/18/2019 10:43     Objective:  VS:  HT:    WT:   BMI:     BP:118/70  HR:76bpm  TEMP: ( )  RESP:  Physical Exam Constitutional:      General: She is not in acute distress.    Appearance: Normal appearance. She is not ill-appearing.  HENT:     Head: Normocephalic and atraumatic.     Right Ear: External ear normal.     Left Ear: External ear normal.  Eyes:     Extraocular Movements: Extraocular movements intact.  Cardiovascular:     Rate and Rhythm: Normal rate.     Pulses: Normal pulses.  Musculoskeletal:     Right lower leg: No edema.     Left lower leg: No edema.     Comments: Patient has good distal strength with no pain over the greater trochanters.  No clonus or focal weakness. Patient somewhat slow to rise from a seated position  to full extension.  There is concordant low back pain with facet loading and lumbar spine extension rotation.  There are no definitive trigger points but the patient is somewhat tender across the lower back and PSIS.  There is no pain with hip rotation.  Skin:    Findings: No erythema, lesion or rash.  Neurological:     General: No focal deficit present.     Mental Status: She is alert and oriented to person, place, and time.     Sensory: No sensory deficit.     Motor: No weakness or abnormal  muscle tone.     Coordination: Coordination normal.  Psychiatric:        Mood and Affect: Mood normal.        Behavior: Behavior normal.      Imaging: XR C-ARM NO REPORT  Result Date: 12/08/2019 Please see Notes tab for imaging impression.

## 2019-12-09 NOTE — Procedures (Signed)
Lumbar Diagnostic Facet Joint Nerve Block with Fluoroscopic Guidance   Patient: Martha Gilmore      Date of Birth: December 05, 1940 MRN: [id number] PCP: Leamon Arnt, MD      Visit Date: 12/08/2019   Universal Protocol:    Date/Time: 08/17/215:36 AM  Consent Given By: the patient  Position: PRONE  Additional Comments: Vital signs were monitored before and after the procedure. Patient was prepped and draped in the usual sterile fashion. The correct patient, procedure, and site was verified.   Injection Procedure Details:  Procedure Site One Meds Administered:  Meds ordered this encounter  Medications  . methylPREDNISolone acetate (DEPO-MEDROL) injection 40 mg     Laterality: Bilateral  Location/Site:  L4-L5 L5-S1  Needle size: 22 ga.  Needle type:spinal  Needle Placement: Oblique pedical  Findings:   -Comments: There was excellent flow of contrast along the articular pillars without intravascular flow.  Procedure Details: The fluoroscope beam is vertically oriented in AP and then obliqued 15 to 20 degrees to the ipsilateral side of the desired nerve to achieve the "Scotty dog" appearance.  The skin over the target area of the junction of the superior articulating process and the transverse process (sacral ala if blocking the L5 dorsal rami) was locally anesthetized with a 1 ml volume of 1% Lidocaine without Epinephrine.  The spinal needle was inserted and advanced in a trajectory view down to the target.   After contact with periosteum and negative aspirate for blood and CSF, correct placement without intravascular or epidural spread was confirmed by injecting 0.5 ml. of Isovue-250.  A spot radiograph was obtained of this image.    Next, a 0.5 ml. volume of the injectate described above was injected. The needle was then redirected to the other facet joint nerves mentioned above if needed.  Prior to the procedure, the patient was given a Pain Diary which was completed  for baseline measurements.  After the procedure, the patient rated their pain every 30 minutes and will continue rating at this frequency for a total of 5 hours.  The patient has been asked to complete the Diary and return to [place] by mail, fax or hand delivered as soon as possible.   Additional Comments:  The patient tolerated the procedure well Dressing: 2 x 2 sterile gauze and Band-Aid    Post-procedure details: Patient was observed during the procedure. Post-procedure instructions were reviewed.  Patient left the clinic in stable condition.

## 2019-12-15 ENCOUNTER — Encounter: Payer: Self-pay | Admitting: Physical Medicine and Rehabilitation

## 2019-12-17 NOTE — Telephone Encounter (Signed)
Called pt to inform her we didn't receive her diary sheet.

## 2019-12-18 ENCOUNTER — Encounter: Payer: Self-pay | Admitting: Physical Medicine and Rehabilitation

## 2019-12-18 DIAGNOSIS — M9902 Segmental and somatic dysfunction of thoracic region: Secondary | ICD-10-CM | POA: Diagnosis not present

## 2019-12-18 DIAGNOSIS — M5136 Other intervertebral disc degeneration, lumbar region: Secondary | ICD-10-CM | POA: Diagnosis not present

## 2019-12-18 DIAGNOSIS — M5032 Other cervical disc degeneration, mid-cervical region, unspecified level: Secondary | ICD-10-CM | POA: Diagnosis not present

## 2019-12-18 DIAGNOSIS — M9901 Segmental and somatic dysfunction of cervical region: Secondary | ICD-10-CM | POA: Diagnosis not present

## 2019-12-18 DIAGNOSIS — S29012A Strain of muscle and tendon of back wall of thorax, initial encounter: Secondary | ICD-10-CM | POA: Diagnosis not present

## 2019-12-18 DIAGNOSIS — M9903 Segmental and somatic dysfunction of lumbar region: Secondary | ICD-10-CM | POA: Diagnosis not present

## 2019-12-22 ENCOUNTER — Encounter: Payer: Self-pay | Admitting: Family Medicine

## 2019-12-23 DIAGNOSIS — Z23 Encounter for immunization: Secondary | ICD-10-CM | POA: Diagnosis not present

## 2019-12-31 ENCOUNTER — Ambulatory Visit: Payer: Self-pay

## 2019-12-31 ENCOUNTER — Other Ambulatory Visit: Payer: Self-pay

## 2019-12-31 ENCOUNTER — Ambulatory Visit (INDEPENDENT_AMBULATORY_CARE_PROVIDER_SITE_OTHER): Payer: Medicare Other | Admitting: Physical Medicine and Rehabilitation

## 2019-12-31 VITALS — BP 150/68 | HR 54

## 2019-12-31 DIAGNOSIS — M47816 Spondylosis without myelopathy or radiculopathy, lumbar region: Secondary | ICD-10-CM | POA: Diagnosis not present

## 2019-12-31 MED ORDER — METHYLPREDNISOLONE ACETATE 80 MG/ML IJ SUSP
40.0000 mg | Freq: Once | INTRAMUSCULAR | Status: AC
  Administered 2019-12-31: 40 mg

## 2019-12-31 NOTE — Progress Notes (Signed)
Pt state lower back pain. Pt state she was feel relief and doing more than normal because she felt good. Pt state sitting helps with sum of the pain. Pt has hx of inj on 12/08/19 pt state the inj was helping till around 12/19/19 the pain statre coming back.    Numeric Pain Rating Scale and Functional Assessment Average Pain 7    In the last MONTH (on 0-10 scale) has pain interfered with the following?  1. General activity like being  able to carry out your everyday physical activities such as walking, climbing stairs, carrying groceries, or moving a chair?  Rating(8)   +Driver, -BT, -Dye Allergies.

## 2020-01-06 ENCOUNTER — Ambulatory Visit (INDEPENDENT_AMBULATORY_CARE_PROVIDER_SITE_OTHER): Payer: Medicare Other | Admitting: Orthopaedic Surgery

## 2020-01-06 ENCOUNTER — Encounter: Payer: Self-pay | Admitting: Orthopaedic Surgery

## 2020-01-06 VITALS — BP 145/84 | Ht 60.0 in | Wt 168.0 lb

## 2020-01-06 DIAGNOSIS — M47816 Spondylosis without myelopathy or radiculopathy, lumbar region: Secondary | ICD-10-CM | POA: Diagnosis not present

## 2020-01-06 DIAGNOSIS — M5137 Other intervertebral disc degeneration, lumbosacral region: Secondary | ICD-10-CM

## 2020-01-06 NOTE — Progress Notes (Signed)
Office Visit Note   Patient: Martha Gilmore           Date of Birth: [birth year]           MRN: [id number] Visit Date: 01/06/2020              Requested by: Leamon Arnt, [place],  [place] [address] PCP: Leamon Arnt, MD   Assessment & Plan: Visit Diagnoses:  1. DDD (degenerative disc disease), lumbosacral   2. Facet degeneration of lumbar region     Plan: Proceed with lumbar facet rhizotomy.  I will recheck her in 3 months.  We reviewed the MRI scan today report as well as images she has significant curvature above the bottom 2 levels where she has degenerative facets.  She does not have central stenosis or claudication symptoms.  Hopefully should get some good sustained relief with the rhizotomy.  Recheck 3 months.  Follow-Up Instructions: Return in about 3 months (around 04/06/2020).   Orders:  No orders of the defined types were placed in this encounter.  No orders of the defined types were placed in this encounter.     Procedures: No procedures performed   Clinical Data: No additional findings.   Subjective: Chief Complaint  Patient presents with  . Lower Back - Pain, Follow-up    HPI 79 year old female returns with ongoing problems with lumbar degenerative facet disease and scoliosis.  She had previous lumbar epidural with minimal relief 11/24/2019.  Lumbar facet injections were more effective on 816 and 9 8.  She is scheduled for facet rhizotomy L4-5 L5-S1 in October.  She has been using Tylenol.  She denies neurogenic claudication symptoms.  She has pain with the rotation turning twisting.  She has been able to dissipate in activities of daily living.  Good relief with facet injection but  short-term.  Review of Systems 14 point system update unchanged from 10/15/2019 office visit.  Positive for Graves' disease.   Objective: Vital Signs: BP (!) 145/84   Ht 5' (1.524 m)   Wt 168 lb (76.2 kg)   BMI 32.81 kg/m   Physical  Exam Constitutional:      Appearance: She is well-developed.  HENT:     Head: Normocephalic.     Right Ear: External ear normal.     Left Ear: External ear normal.  Eyes:     Pupils: Pupils are equal, round, and reactive to light.  Neck:     Thyroid: No thyromegaly.     Trachea: No tracheal deviation.  Cardiovascular:     Rate and Rhythm: Normal rate.  Pulmonary:     Effort: Pulmonary effort is normal.  Abdominal:     Palpations: Abdomen is soft.  Skin:    General: Skin is warm and dry.  Neurological:     Mental Status: She is alert and oriented to person, place, and time.  Psychiatric:        Behavior: Behavior normal.     Ortho Exam patient has increased discomfort with forward flexion turning twisting.  She is able to heel and toe walk.  No rash over exposed skin. Specialty Comments:  No specialty comments available.  Imaging: No results found.   PMFS History: Patient Active Problem List   Diagnosis Date Noted  . Facet degeneration of lumbar region 01/06/2020  . Chondromalacia, knee, right 11/05/2017  . Osteoarthrosis, localized, primary, knee, right 11/05/2017  . Major depression, chronic 04/16/2017  . Mixed hyperlipidemia 04/16/2017  .  Clostridium difficile infection 04/16/2017  . Family history of early CAD 04/16/2017  . Bilateral sensorineural hearing loss 02/06/2016  . Postablative hypothyroidism 12/20/2015  . History of Graves' disease 03/22/2015  . OAB (overactive bladder) 12/16/2013  . GERD (gastroesophageal reflux disease) 12/13/2012  . Osteoarthritis of [place] joint of thumb 08/13/2012  . Seasonal allergies 08/13/2012  . Urge and stress incontinence 08/13/2012  . Osteopenia 08/05/2012  . DDD (degenerative disc disease), lumbosacral 10/22/2010   Past Medical History:  Diagnosis Date  . Cataracts, bilateral 09/23/2015  . Clostridium difficile infection 04/16/2017   H/o C.diff due to clindamicyin 09/2016; two rounds of treatment for cure. Dr. Benson [place]  .  Depression   . Hyperlipidemia   . Hypertension   . OAB (overactive bladder) 12/16/2013  . Osteopenia 08/05/2012   Dexa scan 07/2012 mild osteopenia, T = -1.1  . Seasonal allergies   . Urge and stress incontinence     Family History  Problem Relation Age of Onset  . Heart disease Mother   . Cancer Father   . Heart disease Father   . Lung cancer Sister   . Heart disease Sister   . Diabetes Sister   . Brain cancer Brother   . Heart disease Maternal Grandmother   . Kidney cancer Sister     Past Surgical History:  Procedure Laterality Date  . APPENDECTOMY  1971  . CESAREAN SECTION    . CHOLECYSTECTOMY    . Teeth Implants    . TRIGGER FINGER RELEASE    . VAGINAL HYSTERECTOMY     Social History   Occupational History  . Not on file  Tobacco Use  . Smoking status: Never Smoker  . Smokeless tobacco: Never Used  Vaping Use  . Vaping Use: Never used  Substance and Sexual Activity  . Alcohol use: No  . Drug use: No  . Sexual activity: Never    Partners: Male

## 2020-01-12 NOTE — Progress Notes (Signed)
[name] - 79 y.o. female MRN [id number]  Date of birth: 1941/02/11  Office Visit Note: Visit Date: 12/31/2019 PCP: Leamon Arnt, MD Referred by: Leamon Arnt, MD  Subjective: Chief Complaint  Patient presents with  . Lower Back - Pain   HPI:  Martha Gilmore is a 79 y.o. female who comes in today for planned repeat Bilateral L4-L5 L5-S1 Lumbar facet/medial branch block with fluoroscopic guidance.  The patient has failed conservative care including home exercise, medications, time and activity modification.  This injection will be diagnostic and hopefully therapeutic.  Please see requesting physician notes for further details and justification.  Exam shows concordant low back pain with facet joint loading and extension. Patient received more than 80% pain relief from prior injection.     Referring:Dr. Rodell Perna    ROS Otherwise per HPI.  Assessment & Plan: Visit Diagnoses:  1. Spondylosis without myelopathy or radiculopathy, lumbar region     Plan: No additional findings.   Meds & Orders:  Meds ordered this encounter  Medications  . methylPREDNISolone acetate (DEPO-MEDROL) injection 40 mg    Orders Placed This Encounter  Procedures  . Facet Injection  . XR C-ARM NO REPORT    Follow-up: Return for Review Pain Diary.   Procedures: No procedures performed  Lumbar Diagnostic Facet Joint Nerve Block with Fluoroscopic Guidance   Patient: Martha Gilmore      Date of Birth: May 22, 1940 MRN: [id number] PCP: Leamon Arnt, MD      Visit Date: 12/31/2019   Universal Protocol:    Date/Time: 09/20/215:59 AM  Consent Given By: the patient  Position: PRONE  Additional Comments: Vital signs were monitored before and after the procedure. Patient was prepped and draped in the usual sterile fashion. The correct patient, procedure, and site was verified.   Injection Procedure Details:  Procedure Site One Meds Administered:  Meds ordered this encounter    Medications  . methylPREDNISolone acetate (DEPO-MEDROL) injection 40 mg     Laterality: Bilateral  Location/Site: Patient appears to have a lumbarized S1 segment. L4-L5 L5-S1  Needle size: 22 ga.  Needle type:spinal  Needle Placement: Oblique pedical  Findings:   -Comments: There was excellent flow of contrast along the articular pillars without intravascular flow.  Procedure Details: The fluoroscope beam is vertically oriented in AP and then obliqued 15 to 20 degrees to the ipsilateral side of the desired nerve to achieve the "Scotty dog" appearance.  The skin over the target area of the junction of the superior articulating process and the transverse process (sacral ala if blocking the L5 dorsal rami) was locally anesthetized with a 1 ml volume of 1% Lidocaine without Epinephrine.  The spinal needle was inserted and advanced in a trajectory view down to the target.   After contact with periosteum and negative aspirate for blood and CSF, correct placement without intravascular or epidural spread was confirmed by injecting 0.5 ml. of Isovue-250.  A spot radiograph was obtained of this image.    Next, a 0.5 ml. volume of the injectate described above was injected. The needle was then redirected to the other facet joint nerves mentioned above if needed.  Prior to the procedure, the patient was given a Pain Diary which was completed for baseline measurements.  After the procedure, the patient rated their pain every 30 minutes and will continue rating at this frequency for a total of 5 hours.  The patient has been asked to complete the Diary and return to [place]  by mail, fax or hand delivered as soon as possible.   Additional Comments:  The patient tolerated the procedure well Dressing: 2 x 2 sterile gauze and Band-Aid    Post-procedure details: Patient was observed during the procedure. Post-procedure instructions were reviewed.  Patient left the clinic in stable condition.     Clinical History: CLINICAL DATA:  Low back pain getting worse over the past 8-10 weeks with no known injury.  EXAM: MRI LUMBAR SPINE WITHOUT CONTRAST  TECHNIQUE: Multiplanar, multisequence MR imaging of the lumbar spine was performed. No intravenous contrast was administered.  COMPARISON:  None.  FINDINGS: Segmentation:  Standard.  Alignment: No scoliosis of the lumbar spine. 2 mm retrolisthesis of L2 on L3. Grade 1 anterolisthesis of L4 on L5 secondary to facet disease.  Vertebrae:  No fracture, evidence of discitis, or bone lesion.  Conus medullaris and cauda equina: Conus extends to the L1 level. Conus and cauda equina appear normal.  Paraspinal and other soft tissues: No acute paraspinal abnormality.  Disc levels:  Disc spaces: Degenerative disease with disc height loss throughout the lumbar spine with relative sparing at L4-5. Discogenic changes at L1-2.  T11-12: Mild broad-based disc bulge. Mild bilateral facet arthropathy. Mild right foraminal narrowing. No left foraminal narrowing.  T12-L1: Minimal broad-based disc bulge. No evidence of neural foraminal stenosis. No central canal stenosis.  L1-L2: Broad-based disc bulge. Mild bilateral facet arthropathy. Moderate right and mild left foraminal stenosis. Bilateral subarticular recess narrowing. No spinal stenosis.  L2-L3: Broad-based disc osteophyte complex. Moderate left and mild right facet arthropathy. Severe left subarticular recess stenosis. Severe left foraminal stenosis. Scratch them moderate left foraminal stenosis. No right foraminal stenosis. No central canal stenosis.  L3-L4: Partial ankylosis along the left side of the L3-4 disc with osseous ridging along the foraminal aspect. Mild bilateral facet arthropathy. Mild left foraminal stenosis. No right foraminal stenosis. No evidence of neural foraminal stenosis. No central canal stenosis. Left lateral recess stenosis.  L4-L5:  Broad-based disc bulge. Severe bilateral facet arthropathy with ligamentum flavum infolding. Moderate spinal stenosis. Bilateral lateral recess stenosis. Mild bilateral foraminal stenosis.  L5-S1: Broad-based disc bulge. Moderate bilateral facet arthropathy. Mild spinal stenosis. Bilateral lateral recess stenosis. Moderate bilateral foraminal stenosis.  IMPRESSION: 1. Diffuse lumbar spine spondylosis as described above. 2. No acute osseous injury of the lumbar spine.   Electronically Signed   By: Kathreen Devoid   On: 10/18/2019 10:43     Objective:  VS:  HT:    WT:   BMI:     BP:(!) 150/68  HR:(!) 54bpm  TEMP: ( )  RESP:  Physical Exam Constitutional:      General: She is not in acute distress.    Appearance: Normal appearance. She is not ill-appearing.  HENT:     Head: Normocephalic and atraumatic.     Right Ear: External ear normal.     Left Ear: External ear normal.  Eyes:     Extraocular Movements: Extraocular movements intact.  Cardiovascular:     Rate and Rhythm: Normal rate.     Pulses: Normal pulses.  Musculoskeletal:     Right lower leg: No edema.     Left lower leg: No edema.     Comments: Patient has good distal strength with no pain over the greater trochanters.  No clonus or focal weakness.Patient somewhat slow to rise from a seated position to full extension.  There is concordant low back pain with facet loading and lumbar spine extension rotation.  There  are no definitive trigger points but the patient is somewhat tender across the lower back and PSIS.  There is no pain with hip rotation.   Skin:    Findings: No erythema, lesion or rash.  Neurological:     General: No focal deficit present.     Mental Status: She is alert and oriented to person, place, and time.     Sensory: No sensory deficit.     Motor: No weakness or abnormal muscle tone.     Coordination: Coordination normal.  Psychiatric:        Mood and Affect: Mood normal.         Behavior: Behavior normal.      Imaging: No results found.

## 2020-01-12 NOTE — Procedures (Signed)
Lumbar Diagnostic Facet Joint Nerve Block with Fluoroscopic Guidance   Patient: Martha Gilmore      Date of Birth: 10/26/1940 MRN: [id number] PCP: Leamon Arnt, MD      Visit Date: 12/31/2019   Universal Protocol:    Date/Time: 09/20/215:59 AM  Consent Given By: the patient  Position: PRONE  Additional Comments: Vital signs were monitored before and after the procedure. Patient was prepped and draped in the usual sterile fashion. The correct patient, procedure, and site was verified.   Injection Procedure Details:  Procedure Site One Meds Administered:  Meds ordered this encounter  Medications  . methylPREDNISolone acetate (DEPO-MEDROL) injection 40 mg     Laterality: Bilateral  Location/Site: Patient appears to have a lumbarized S1 segment. L4-L5 L5-S1  Needle size: 22 ga.  Needle type:spinal  Needle Placement: Oblique pedical  Findings:   -Comments: There was excellent flow of contrast along the articular pillars without intravascular flow.  Procedure Details: The fluoroscope beam is vertically oriented in AP and then obliqued 15 to 20 degrees to the ipsilateral side of the desired nerve to achieve the "Scotty dog" appearance.  The skin over the target area of the junction of the superior articulating process and the transverse process (sacral ala if blocking the L5 dorsal rami) was locally anesthetized with a 1 ml volume of 1% Lidocaine without Epinephrine.  The spinal needle was inserted and advanced in a trajectory view down to the target.   After contact with periosteum and negative aspirate for blood and CSF, correct placement without intravascular or epidural spread was confirmed by injecting 0.5 ml. of Isovue-250.  A spot radiograph was obtained of this image.    Next, a 0.5 ml. volume of the injectate described above was injected. The needle was then redirected to the other facet joint nerves mentioned above if needed.  Prior to the procedure, the  patient was given a Pain Diary which was completed for baseline measurements.  After the procedure, the patient rated their pain every 30 minutes and will continue rating at this frequency for a total of 5 hours.  The patient has been asked to complete the Diary and return to [place] by mail, fax or hand delivered as soon as possible.   Additional Comments:  The patient tolerated the procedure well Dressing: 2 x 2 sterile gauze and Band-Aid    Post-procedure details: Patient was observed during the procedure. Post-procedure instructions were reviewed.  Patient left the clinic in stable condition.

## 2020-01-14 ENCOUNTER — Encounter: Payer: Self-pay | Admitting: Family Medicine

## 2020-01-15 DIAGNOSIS — M5032 Other cervical disc degeneration, mid-cervical region, unspecified level: Secondary | ICD-10-CM | POA: Diagnosis not present

## 2020-01-15 DIAGNOSIS — M5136 Other intervertebral disc degeneration, lumbar region: Secondary | ICD-10-CM | POA: Diagnosis not present

## 2020-01-15 DIAGNOSIS — M9901 Segmental and somatic dysfunction of cervical region: Secondary | ICD-10-CM | POA: Diagnosis not present

## 2020-01-15 DIAGNOSIS — M9903 Segmental and somatic dysfunction of lumbar region: Secondary | ICD-10-CM | POA: Diagnosis not present

## 2020-01-15 DIAGNOSIS — M9902 Segmental and somatic dysfunction of thoracic region: Secondary | ICD-10-CM | POA: Diagnosis not present

## 2020-01-15 DIAGNOSIS — S29012A Strain of muscle and tendon of back wall of thorax, initial encounter: Secondary | ICD-10-CM | POA: Diagnosis not present

## 2020-01-16 DIAGNOSIS — H60392 Other infective otitis externa, left ear: Secondary | ICD-10-CM | POA: Diagnosis not present

## 2020-01-20 ENCOUNTER — Other Ambulatory Visit: Payer: Self-pay | Admitting: Family Medicine

## 2020-01-20 DIAGNOSIS — Z23 Encounter for immunization: Secondary | ICD-10-CM | POA: Diagnosis not present

## 2020-01-20 DIAGNOSIS — N3281 Overactive bladder: Secondary | ICD-10-CM

## 2020-01-27 ENCOUNTER — Ambulatory Visit: Payer: Self-pay

## 2020-01-27 ENCOUNTER — Encounter: Payer: Self-pay | Admitting: Physical Medicine and Rehabilitation

## 2020-01-27 ENCOUNTER — Ambulatory Visit (INDEPENDENT_AMBULATORY_CARE_PROVIDER_SITE_OTHER): Payer: Medicare Other | Admitting: Physical Medicine and Rehabilitation

## 2020-01-27 ENCOUNTER — Other Ambulatory Visit: Payer: Self-pay

## 2020-01-27 DIAGNOSIS — M47816 Spondylosis without myelopathy or radiculopathy, lumbar region: Secondary | ICD-10-CM | POA: Diagnosis not present

## 2020-01-27 MED ORDER — METHYLPREDNISOLONE ACETATE 80 MG/ML IJ SUSP
80.0000 mg | Freq: Once | INTRAMUSCULAR | Status: AC
  Administered 2020-01-27: 80 mg

## 2020-01-27 NOTE — Progress Notes (Signed)
Pt state lower back pain. Pt state walking from the end of the driveway and back made he pain worse. Pt state she using heat pads to ease the pain.  Numeric Pain Rating Scale and Functional Assessment Average Pain 4   In the last MONTH (on 0-10 scale) has pain interfered with the following?  1. General activity like being  able to carry out your everyday physical activities such as walking, climbing stairs, carrying groceries, or moving a chair?  Rating(7)   +Driver, -BT, -Dye Allergies.

## 2020-02-03 ENCOUNTER — Other Ambulatory Visit: Payer: Self-pay

## 2020-02-03 ENCOUNTER — Encounter: Payer: Self-pay | Admitting: Physical Medicine and Rehabilitation

## 2020-02-03 ENCOUNTER — Ambulatory Visit (INDEPENDENT_AMBULATORY_CARE_PROVIDER_SITE_OTHER): Payer: Medicare Other | Admitting: Physical Medicine and Rehabilitation

## 2020-02-03 ENCOUNTER — Ambulatory Visit: Payer: Self-pay

## 2020-02-03 VITALS — BP 146/66 | HR 53

## 2020-02-03 DIAGNOSIS — M47816 Spondylosis without myelopathy or radiculopathy, lumbar region: Secondary | ICD-10-CM

## 2020-02-03 MED ORDER — METHYLPREDNISOLONE ACETATE 80 MG/ML IJ SUSP
80.0000 mg | Freq: Once | INTRAMUSCULAR | Status: AC
  Administered 2020-02-03: 80 mg

## 2020-02-03 NOTE — Progress Notes (Signed)
Pt state lower back pain. Pt state standing and doing house work makes the pain worse. Pt state using the heating pad for a few minutes it helps ease the pain. Pt state she has to sit for 20 mins to ease the pain. Pt has hx of inj Pt state it was great but now pain is slowly returning.  Numeric Pain Rating Scale and Functional Assessment Average Pain 2   In the last MONTH (on 0-10 scale) has pain interfered with the following?  1. General activity like being  able to carry out your everyday physical activities such as walking, climbing stairs, carrying groceries, or moving a chair?  Rating(9)   +Driver, -BT, -Dye Allergies.

## 2020-02-16 NOTE — Progress Notes (Signed)
Martha Gilmore - 79 y.o. female MRN [id number]  Date of birth: Oct 31, 1940  Office Visit Note: Visit Date: 02/03/2020 PCP: Leamon Arnt, MD Referred by: Leamon Arnt, MD  Subjective: Chief Complaint  Patient presents with  . Lower Back - Pain   HPI:  Martha Gilmore is a 79 y.o. female who comes in today for planned radiofrequency ablation of the Left L4-L5 L5-S1 Lumbar facet joints. This would be ablation of the corresponding medial branches and/or dorsal rami.  Patient has had double diagnostic blocks with more than 50% relief.  These are documented on pain diary.  They have had chronic back pain for quite some time, more than 3 months, which has been an ongoing situation with recalcitrant axial back pain.  They have no radicular pain.  Their axial pain is worse with standing and ambulating and on exam today with facet loading.  They have had physical therapy as well as home exercise program.  The imaging noted in the chart below indicated facet pathology. Accordingly they meet all the criteria and qualification for for radiofrequency ablation and we are going to complete this today hopefully for more longer term relief as part of comprehensive management program.  MRI reviewed with images and spine model.  MRI reviewed in the note below.   ROS Otherwise per HPI.  Assessment & Plan: Visit Diagnoses:  1. Spondylosis without myelopathy or radiculopathy, lumbar region     Plan: No additional findings.   Meds & Orders:  Meds ordered this encounter  Medications  . methylPREDNISolone acetate (DEPO-MEDROL) injection 80 mg    Orders Placed This Encounter  Procedures  . Radiofrequency,Lumbar  . XR C-ARM NO REPORT    Follow-up: Return if symptoms worsen or fail to improve.   Procedures: No procedures performed  Lumbar Facet Joint Nerve Denervation  Patient: Martha Gilmore      Date of Birth: 1940-08-19 MRN: [id number] PCP: Leamon Arnt, MD      Visit Date: 02/03/2020    Universal Protocol:    Date/Time: 10/25/215:29 AM  Consent Given By: the patient  Position: PRONE  Additional Comments: Vital signs were monitored before and after the procedure. Patient was prepped and draped in the usual sterile fashion. The correct patient, procedure, and site was verified.   Injection Procedure Details:   Procedure diagnoses:  1. Spondylosis without myelopathy or radiculopathy, lumbar region      Meds Administered:  Meds ordered this encounter  Medications  . methylPREDNISolone acetate (DEPO-MEDROL) injection 80 mg     Laterality: Left  Location/Site:  L4-L5 L5-S1  Needle size: 18 G  Needle type: Radiofrequency cannula  Needle Placement: Along juncture of superior articular process and transverse pocess  Findings:  -Comments:  Procedure Details: For each desired target nerve, the corresponding transverse process (sacral ala for the L5 dorsal rami) was identified and the fluoroscope was positioned to square off the endplates of the corresponding vertebral body to achieve a true AP midline view.  The beam was then obliqued 15 to 20 degrees and caudally tilted 15 to 20 degrees to line up a trajectory along the target nerves. The skin over the target of the junction of superior articulating process and transverse process (sacral ala for the L5 dorsal rami) was infiltrated with 42ml of 1% Lidocaine without Epinephrine.  The 18 gauge 63mm active tip outer cannula was advanced in trajectory view to the target.  This procedure was repeated for each target nerve.  Then, for all levels,  the outer cannula placement was fine-tuned and the position was then confirmed with bi-planar imaging.    Test stimulation was done both at sensory and motor levels to ensure there was no radicular stimulation. The target tissues were then infiltrated with 1 ml of 1% Lidocaine without Epinephrine. Subsequently, a percutaneous neurotomy was carried out for 90 seconds at 80  degrees Celsius.  After the completion of the lesion, 1 ml of injectate was delivered. It was then repeated for each facet joint nerve mentioned above. Appropriate radiographs were obtained to verify the probe placement during the neurotomy.   Additional Comments:  The patient tolerated the procedure well Dressing: 2 x 2 sterile gauze and Band-Aid    Post-procedure details: Patient was observed during the procedure. Post-procedure instructions were reviewed.  Patient left the clinic in stable condition.        Clinical History: CLINICAL DATA:  Low back pain getting worse over the past 8-10 weeks with no known injury.  EXAM: MRI LUMBAR SPINE WITHOUT CONTRAST  TECHNIQUE: Multiplanar, multisequence MR imaging of the lumbar spine was performed. No intravenous contrast was administered.  COMPARISON:  None.  FINDINGS: Segmentation:  Standard.  Alignment: No scoliosis of the lumbar spine. 2 mm retrolisthesis of L2 on L3. Grade 1 anterolisthesis of L4 on L5 secondary to facet disease.  Vertebrae:  No fracture, evidence of discitis, or bone lesion.  Conus medullaris and cauda equina: Conus extends to the L1 level. Conus and cauda equina appear normal.  Paraspinal and other soft tissues: No acute paraspinal abnormality.  Disc levels:  Disc spaces: Degenerative disease with disc height loss throughout the lumbar spine with relative sparing at L4-5. Discogenic changes at L1-2.  T11-12: Mild broad-based disc bulge. Mild bilateral facet arthropathy. Mild right foraminal narrowing. No left foraminal narrowing.  T12-L1: Minimal broad-based disc bulge. No evidence of neural foraminal stenosis. No central canal stenosis.  L1-L2: Broad-based disc bulge. Mild bilateral facet arthropathy. Moderate right and mild left foraminal stenosis. Bilateral subarticular recess narrowing. No spinal stenosis.  L2-L3: Broad-based disc osteophyte complex. Moderate left and  mild right facet arthropathy. Severe left subarticular recess stenosis. Severe left foraminal stenosis. Scratch them moderate left foraminal stenosis. No right foraminal stenosis. No central canal stenosis.  L3-L4: Partial ankylosis along the left side of the L3-4 disc with osseous ridging along the foraminal aspect. Mild bilateral facet arthropathy. Mild left foraminal stenosis. No right foraminal stenosis. No evidence of neural foraminal stenosis. No central canal stenosis. Left lateral recess stenosis.  L4-L5: Broad-based disc bulge. Severe bilateral facet arthropathy with ligamentum flavum infolding. Moderate spinal stenosis. Bilateral lateral recess stenosis. Mild bilateral foraminal stenosis.  L5-S1: Broad-based disc bulge. Moderate bilateral facet arthropathy. Mild spinal stenosis. Bilateral lateral recess stenosis. Moderate bilateral foraminal stenosis.  IMPRESSION: 1. Diffuse lumbar spine spondylosis as described above. 2. No acute osseous injury of the lumbar spine.   Electronically Signed   By: Kathreen Devoid   On: 10/18/2019 10:43     Objective:  VS:  HT:    WT:   BMI:     BP:(!) 146/66  HR:(!) 53bpm  TEMP: ( )  RESP:  Physical Exam Constitutional:      General: She is not in acute distress.    Appearance: Normal appearance. She is not ill-appearing.  HENT:     Head: Normocephalic and atraumatic.     Right Ear: External ear normal.     Left Ear: External ear normal.  Eyes:     Extraocular  Movements: Extraocular movements intact.  Cardiovascular:     Rate and Rhythm: Normal rate.     Pulses: Normal pulses.  Musculoskeletal:     Right lower leg: No edema.     Left lower leg: No edema.     Comments: Patient has good distal strength with no pain over the greater trochanters.  No clonus or focal weakness. Patient somewhat slow to rise from a seated position to full extension.  There is concordant low back pain with facet loading and lumbar spine  extension rotation.  There are no definitive trigger points but the patient is somewhat tender across the lower back and PSIS.  There is no pain with hip rotation.   Skin:    Findings: No erythema, lesion or rash.  Neurological:     General: No focal deficit present.     Mental Status: She is alert and oriented to person, place, and time.     Sensory: No sensory deficit.     Motor: No weakness or abnormal muscle tone.     Coordination: Coordination normal.  Psychiatric:        Mood and Affect: Mood normal.        Behavior: Behavior normal.      Imaging: No results found.

## 2020-02-16 NOTE — Procedures (Signed)
Lumbar Facet Joint Nerve Denervation  Patient: Martha Gilmore      Date of Birth: Oct 31, 1940 MRN: [id number] PCP: Leamon Arnt, MD      Visit Date: 02/03/2020   Universal Protocol:    Date/Time: 10/25/215:29 AM  Consent Given By: the patient  Position: PRONE  Additional Comments: Vital signs were monitored before and after the procedure. Patient was prepped and draped in the usual sterile fashion. The correct patient, procedure, and site was verified.   Injection Procedure Details:   Procedure diagnoses:  1. Spondylosis without myelopathy or radiculopathy, lumbar region      Meds Administered:  Meds ordered this encounter  Medications  . methylPREDNISolone acetate (DEPO-MEDROL) injection 80 mg     Laterality: Left  Location/Site:  L4-L5 L5-S1  Needle size: 18 G  Needle type: Radiofrequency cannula  Needle Placement: Along juncture of superior articular process and transverse pocess  Findings:  -Comments:  Procedure Details: For each desired target nerve, the corresponding transverse process (sacral ala for the L5 dorsal rami) was identified and the fluoroscope was positioned to square off the endplates of the corresponding vertebral body to achieve a true AP midline view.  The beam was then obliqued 15 to 20 degrees and caudally tilted 15 to 20 degrees to line up a trajectory along the target nerves. The skin over the target of the junction of superior articulating process and transverse process (sacral ala for the L5 dorsal rami) was infiltrated with 65ml of 1% Lidocaine without Epinephrine.  The 18 gauge 12mm active tip outer cannula was advanced in trajectory view to the target.  This procedure was repeated for each target nerve.  Then, for all levels, the outer cannula placement was fine-tuned and the position was then confirmed with bi-planar imaging.    Test stimulation was done both at sensory and motor levels to ensure there was no radicular stimulation.  The target tissues were then infiltrated with 1 ml of 1% Lidocaine without Epinephrine. Subsequently, a percutaneous neurotomy was carried out for 90 seconds at 80 degrees Celsius.  After the completion of the lesion, 1 ml of injectate was delivered. It was then repeated for each facet joint nerve mentioned above. Appropriate radiographs were obtained to verify the probe placement during the neurotomy.   Additional Comments:  The patient tolerated the procedure well Dressing: 2 x 2 sterile gauze and Band-Aid    Post-procedure details: Patient was observed during the procedure. Post-procedure instructions were reviewed.  Patient left the clinic in stable condition.

## 2020-02-23 NOTE — Progress Notes (Signed)
Sidonia San Marino - 79 y.o. female MRN [id number]  Date of birth: [birth year]  Office Visit Note: Visit Date: 01/27/2020 PCP: Leamon Arnt, MD Referred by: Leamon Arnt, MD  Subjective: Chief Complaint  Patient presents with  . Lower Back - Pain   HPI:  Danice San [place]ino is a 79 y.o. female who comes in today for planned radiofrequency ablation of the Right L4-L5 and L5-S1 Lumbar facet joints. This would be ablation of the corresponding medial branches and/or dorsal rami.  Patient has had double diagnostic blocks with more than 50% relief.  These are documented on pain diary.  They have had chronic back pain for quite some time, more than 3 months, which has been an ongoing situation with recalcitrant axial back pain.  They have no radicular pain.  Their axial pain is worse with standing and ambulating and on exam today with facet loading.  They have had physical therapy as well as home exercise program.  The imaging noted in the chart below indicated facet pathology. Accordingly they meet all the criteria and qualification for for radiofrequency ablation and we are going to complete this today hopefully for more longer term relief as part of comprehensive management program.  Referring: Rodell Perna, M.D.   ROS Otherwise per HPI.  Assessment & Plan: Visit Diagnoses:  1. Spondylosis without myelopathy or radiculopathy, lumbar region     Plan: No additional findings.   Meds & Orders:  Meds ordered this encounter  Medications  . methylPREDNISolone acetate (DEPO-MEDROL) injection 80 mg    Orders Placed This Encounter  Procedures  . Radiofrequency,Lumbar  . XR C-ARM NO REPORT    Follow-up: Return if symptoms worsen or fail to improve.   Procedures: No procedures performed  Lumbar Facet Joint Nerve Denervation  Patient: Amparo San [place]ino      Date of Birth: July 30, 1940 MRN: [id number] PCP: Leamon Arnt, MD      Visit Date: 01/27/2020   Universal Protocol:    Date/Time:  11/01/216:38 AM  Consent Given By: the patient  Position: PRONE  Additional Comments: Vital signs were monitored before and after the procedure. Patient was prepped and draped in the usual sterile fashion. The correct patient, procedure, and site was verified.   Injection Procedure Details:   Procedure diagnoses:  1. Spondylosis without myelopathy or radiculopathy, lumbar region      Meds Administered:  Meds ordered this encounter  Medications  . methylPREDNISolone acetate (DEPO-MEDROL) injection 80 mg     Laterality: Right  Location/Site:  L4-L5 L5-S1  Needle size: 18 G  Needle type: Radiofrequency cannula  Needle Placement: Along juncture of superior articular process and transverse pocess  Findings:  -Comments:  Procedure Details: For each desired target nerve, the corresponding transverse process (sacral ala for the L5 dorsal rami) was identified and the fluoroscope was positioned to square off the endplates of the corresponding vertebral body to achieve a true AP midline view.  The beam was then obliqued 15 to 20 degrees and caudally tilted 15 to 20 degrees to line up a trajectory along the target nerves. The skin over the target of the junction of superior articulating process and transverse process (sacral ala for the L5 dorsal rami) was infiltrated with 12ml of 1% Lidocaine without Epinephrine.  The 18 gauge 15mm active tip outer cannula was advanced in trajectory view to the target.  This procedure was repeated for each target nerve.  Then, for all levels, the outer cannula placement was fine-tuned and the position  was then confirmed with bi-planar imaging.    Test stimulation was done both at sensory and motor levels to ensure there was no radicular stimulation. The target tissues were then infiltrated with 1 ml of 1% Lidocaine without Epinephrine. Subsequently, a percutaneous neurotomy was carried out for 90 seconds at 80 degrees Celsius.  After the completion of  the lesion, 1 ml of injectate was delivered. It was then repeated for each facet joint nerve mentioned above. Appropriate radiographs were obtained to verify the probe placement during the neurotomy.   Additional Comments:  The patient tolerated the procedure well Dressing: 2 x 2 sterile gauze and Band-Aid    Post-procedure details: Patient was observed during the procedure. Post-procedure instructions were reviewed.  Patient left the clinic in stable condition.        Clinical History: CLINICAL DATA:  Low back pain getting worse over the past 8-10 weeks with no known injury.  EXAM: MRI LUMBAR SPINE WITHOUT CONTRAST  TECHNIQUE: Multiplanar, multisequence MR imaging of the lumbar spine was performed. No intravenous contrast was administered.  COMPARISON:  None.  FINDINGS: Segmentation:  Standard.  Alignment: No scoliosis of the lumbar spine. 2 mm retrolisthesis of L2 on L3. Grade 1 anterolisthesis of L4 on L5 secondary to facet disease.  Vertebrae:  No fracture, evidence of discitis, or bone lesion.  Conus medullaris and cauda equina: Conus extends to the L1 level. Conus and cauda equina appear normal.  Paraspinal and other soft tissues: No acute paraspinal abnormality.  Disc levels:  Disc spaces: Degenerative disease with disc height loss throughout the lumbar spine with relative sparing at L4-5. Discogenic changes at L1-2.  T11-12: Mild broad-based disc bulge. Mild bilateral facet arthropathy. Mild right foraminal narrowing. No left foraminal narrowing.  T12-L1: Minimal broad-based disc bulge. No evidence of neural foraminal stenosis. No central canal stenosis.  L1-L2: Broad-based disc bulge. Mild bilateral facet arthropathy. Moderate right and mild left foraminal stenosis. Bilateral subarticular recess narrowing. No spinal stenosis.  L2-L3: Broad-based disc osteophyte complex. Moderate left and mild right facet arthropathy. Severe left  subarticular recess stenosis. Severe left foraminal stenosis. Scratch them moderate left foraminal stenosis. No right foraminal stenosis. No central canal stenosis.  L3-L4: Partial ankylosis along the left side of the L3-4 disc with osseous ridging along the foraminal aspect. Mild bilateral facet arthropathy. Mild left foraminal stenosis. No right foraminal stenosis. No evidence of neural foraminal stenosis. No central canal stenosis. Left lateral recess stenosis.  L4-L5: Broad-based disc bulge. Severe bilateral facet arthropathy with ligamentum flavum infolding. Moderate spinal stenosis. Bilateral lateral recess stenosis. Mild bilateral foraminal stenosis.  L5-S1: Broad-based disc bulge. Moderate bilateral facet arthropathy. Mild spinal stenosis. Bilateral lateral recess stenosis. Moderate bilateral foraminal stenosis.  IMPRESSION: 1. Diffuse lumbar spine spondylosis as described above. 2. No acute osseous injury of the lumbar spine.   Electronically Signed   By: Kathreen Devoid   On: 10/18/2019 10:43     Objective:  VS:  HT:    WT:   BMI:     BP:   HR: bpm  TEMP: ( )  RESP:  Physical Exam Constitutional:      General: She is not in acute distress.    Appearance: Normal appearance. She is not ill-appearing.  HENT:     Head: Normocephalic and atraumatic.     Right Ear: External ear normal.     Left Ear: External ear normal.  Eyes:     Extraocular Movements: Extraocular movements intact.  Cardiovascular:  Rate and Rhythm: Normal rate.     Pulses: Normal pulses.  Musculoskeletal:     Right lower leg: No edema.     Left lower leg: No edema.     Comments: Patient has good distal strength with no pain over the greater trochanters.  No clonus or focal weakness.Patient somewhat slow to rise from a seated position to full extension.  There is concordant low back pain with facet loading and lumbar spine extension rotation.  There are no definitive trigger points but  the patient is somewhat tender across the lower back and PSIS.  There is no pain with hip rotation.   Skin:    Findings: No erythema, lesion or rash.  Neurological:     General: No focal deficit present.     Mental Status: She is alert and oriented to person, place, and time.     Sensory: No sensory deficit.     Motor: No weakness or abnormal muscle tone.     Coordination: Coordination normal.  Psychiatric:        Mood and Affect: Mood normal.        Behavior: Behavior normal.      Imaging: No results found.

## 2020-02-23 NOTE — Procedures (Signed)
Lumbar Facet Joint Nerve Denervation  Patient: Martha Gilmore      Date of Birth: 1940/06/03 MRN: [id number] PCP: Leamon Arnt, MD      Visit Date: 01/27/2020   Universal Protocol:    Date/Time: 11/01/216:38 AM  Consent Given By: the patient  Position: PRONE  Additional Comments: Vital signs were monitored before and after the procedure. Patient was prepped and draped in the usual sterile fashion. The correct patient, procedure, and site was verified.   Injection Procedure Details:   Procedure diagnoses:  1. Spondylosis without myelopathy or radiculopathy, lumbar region      Meds Administered:  Meds ordered this encounter  Medications   methylPREDNISolone acetate (DEPO-MEDROL) injection 80 mg     Laterality: Right  Location/Site:  L4-L5 L5-S1  Needle size: 18 G  Needle type: Radiofrequency cannula  Needle Placement: Along juncture of superior articular process and transverse pocess  Findings:  -Comments:  Procedure Details: For each desired target nerve, the corresponding transverse process (sacral ala for the L5 dorsal rami) was identified and the fluoroscope was positioned to square off the endplates of the corresponding vertebral body to achieve a true AP midline view.  The beam was then obliqued 15 to 20 degrees and caudally tilted 15 to 20 degrees to line up a trajectory along the target nerves. The skin over the target of the junction of superior articulating process and transverse process (sacral ala for the L5 dorsal rami) was infiltrated with 67ml of 1% Lidocaine without Epinephrine.  The 18 gauge 48mm active tip outer cannula was advanced in trajectory view to the target.  This procedure was repeated for each target nerve.  Then, for all levels, the outer cannula placement was fine-tuned and the position was then confirmed with bi-planar imaging.    Test stimulation was done both at sensory and motor levels to ensure there was no radicular stimulation.  The target tissues were then infiltrated with 1 ml of 1% Lidocaine without Epinephrine. Subsequently, a percutaneous neurotomy was carried out for 90 seconds at 80 degrees Celsius.  After the completion of the lesion, 1 ml of injectate was delivered. It was then repeated for each facet joint nerve mentioned above. Appropriate radiographs were obtained to verify the probe placement during the neurotomy.   Additional Comments:  The patient tolerated the procedure well Dressing: 2 x 2 sterile gauze and Band-Aid    Post-procedure details: Patient was observed during the procedure. Post-procedure instructions were reviewed.  Patient left the clinic in stable condition.

## 2020-02-25 DIAGNOSIS — H353131 Nonexudative age-related macular degeneration, bilateral, early dry stage: Secondary | ICD-10-CM | POA: Diagnosis not present

## 2020-02-25 DIAGNOSIS — H16223 Keratoconjunctivitis sicca, not specified as Sjogren's, bilateral: Secondary | ICD-10-CM | POA: Diagnosis not present

## 2020-02-25 DIAGNOSIS — H26491 Other secondary cataract, right eye: Secondary | ICD-10-CM | POA: Diagnosis not present

## 2020-02-25 DIAGNOSIS — H43813 Vitreous degeneration, bilateral: Secondary | ICD-10-CM | POA: Diagnosis not present

## 2020-03-25 ENCOUNTER — Encounter: Payer: Self-pay | Admitting: Physical Medicine and Rehabilitation

## 2020-03-29 ENCOUNTER — Encounter: Payer: Self-pay | Admitting: Orthopaedic Surgery

## 2020-04-01 DIAGNOSIS — H16223 Keratoconjunctivitis sicca, not specified as Sjogren's, bilateral: Secondary | ICD-10-CM | POA: Diagnosis not present

## 2020-04-06 ENCOUNTER — Ambulatory Visit: Payer: Medicare Other | Admitting: Orthopaedic Surgery

## 2020-04-07 ENCOUNTER — Other Ambulatory Visit: Payer: Self-pay | Admitting: Family Medicine

## 2020-04-14 ENCOUNTER — Ambulatory Visit: Payer: Self-pay

## 2020-04-14 ENCOUNTER — Ambulatory Visit (INDEPENDENT_AMBULATORY_CARE_PROVIDER_SITE_OTHER): Payer: Medicare Other | Admitting: Physical Medicine and Rehabilitation

## 2020-04-14 ENCOUNTER — Other Ambulatory Visit: Payer: Self-pay

## 2020-04-14 ENCOUNTER — Encounter: Payer: Self-pay | Admitting: Physical Medicine and Rehabilitation

## 2020-04-14 VITALS — BP 148/70 | HR 58

## 2020-04-14 DIAGNOSIS — G894 Chronic pain syndrome: Secondary | ICD-10-CM | POA: Diagnosis not present

## 2020-04-14 DIAGNOSIS — M48062 Spinal stenosis, lumbar region with neurogenic claudication: Secondary | ICD-10-CM

## 2020-04-14 DIAGNOSIS — G8929 Other chronic pain: Secondary | ICD-10-CM | POA: Diagnosis not present

## 2020-04-14 DIAGNOSIS — M545 Low back pain, unspecified: Secondary | ICD-10-CM | POA: Diagnosis not present

## 2020-04-14 DIAGNOSIS — M47816 Spondylosis without myelopathy or radiculopathy, lumbar region: Secondary | ICD-10-CM

## 2020-04-14 MED ORDER — BETAMETHASONE SOD PHOS & ACET 6 (3-3) MG/ML IJ SUSP
12.0000 mg | Freq: Once | INTRAMUSCULAR | Status: DC
Start: 2020-04-14 — End: 2020-04-29

## 2020-04-14 NOTE — Progress Notes (Signed)
Pt state lower back pain. Pt state walking, standing and sitting makes the pain worse. Pt state she use heating pads to help ease the pain. Pt has hx of inj on 02/03/20 pt state she can't not tell but she notice she cant walk and standing longer before she needs her heating pad.  Numeric Pain Rating Scale and Functional Assessment Average Pain 8   In the last MONTH (on 0-10 scale) has pain interfered with the following?  1. General activity like being  able to carry out your everyday physical activities such as walking, climbing stairs, carrying groceries, or moving a chair?  Rating(10)   +Driver, -BT, -Dye Allergies.

## 2020-04-28 DIAGNOSIS — M5136 Other intervertebral disc degeneration, lumbar region: Secondary | ICD-10-CM | POA: Diagnosis not present

## 2020-04-28 DIAGNOSIS — M9901 Segmental and somatic dysfunction of cervical region: Secondary | ICD-10-CM | POA: Diagnosis not present

## 2020-04-28 DIAGNOSIS — M5032 Other cervical disc degeneration, mid-cervical region, unspecified level: Secondary | ICD-10-CM | POA: Diagnosis not present

## 2020-04-28 DIAGNOSIS — M9902 Segmental and somatic dysfunction of thoracic region: Secondary | ICD-10-CM | POA: Diagnosis not present

## 2020-04-28 DIAGNOSIS — M9903 Segmental and somatic dysfunction of lumbar region: Secondary | ICD-10-CM | POA: Diagnosis not present

## 2020-04-28 DIAGNOSIS — S29012A Strain of muscle and tendon of back wall of thorax, initial encounter: Secondary | ICD-10-CM | POA: Diagnosis not present

## 2020-04-29 ENCOUNTER — Other Ambulatory Visit: Payer: Self-pay

## 2020-04-29 ENCOUNTER — Other Ambulatory Visit: Payer: Self-pay | Admitting: Family Medicine

## 2020-04-29 ENCOUNTER — Encounter: Payer: Self-pay | Admitting: Family Medicine

## 2020-04-29 ENCOUNTER — Ambulatory Visit (INDEPENDENT_AMBULATORY_CARE_PROVIDER_SITE_OTHER): Payer: Medicare Other | Admitting: Family Medicine

## 2020-04-29 VITALS — BP 122/70 | HR 68 | Temp 97.8°F | Wt 171.2 lb

## 2020-04-29 DIAGNOSIS — Z1231 Encounter for screening mammogram for malignant neoplasm of breast: Secondary | ICD-10-CM

## 2020-04-29 DIAGNOSIS — Z780 Asymptomatic menopausal state: Secondary | ICD-10-CM

## 2020-04-29 DIAGNOSIS — K219 Gastro-esophageal reflux disease without esophagitis: Secondary | ICD-10-CM

## 2020-04-29 DIAGNOSIS — M5137 Other intervertebral disc degeneration, lumbosacral region: Secondary | ICD-10-CM | POA: Diagnosis not present

## 2020-04-29 DIAGNOSIS — M8580 Other specified disorders of bone density and structure, unspecified site: Secondary | ICD-10-CM

## 2020-04-29 DIAGNOSIS — E782 Mixed hyperlipidemia: Secondary | ICD-10-CM | POA: Diagnosis not present

## 2020-04-29 DIAGNOSIS — F329 Major depressive disorder, single episode, unspecified: Secondary | ICD-10-CM

## 2020-04-29 DIAGNOSIS — E890 Postprocedural hypothyroidism: Secondary | ICD-10-CM | POA: Diagnosis not present

## 2020-04-29 DIAGNOSIS — N3281 Overactive bladder: Secondary | ICD-10-CM | POA: Diagnosis not present

## 2020-04-29 LAB — LIPID PANEL
Cholesterol: 186 mg/dL (ref 0–200)
HDL: 58.5 mg/dL (ref >39.00)
LDL Cholesterol: 110 mg/dL — ABNORMAL HIGH (ref 0–99)
NonHDL: 127.57
Total CHOL/HDL Ratio: 3
Triglycerides: 86 mg/dL (ref 0.0–149.0)
VLDL: 17.2 mg/dL (ref 0.0–40.0)

## 2020-04-29 LAB — COMPREHENSIVE METABOLIC PANEL
ALT: 12 U/L (ref 0–35)
AST: 18 U/L (ref 0–37)
Albumin: 4.3 g/dL (ref 3.5–5.2)
Alkaline Phosphatase: 88 U/L (ref 39–117)
BUN: 19 mg/dL (ref 6–23)
CO2: 29 mEq/L (ref 19–32)
Calcium: 9.8 mg/dL (ref 8.4–10.5)
Chloride: 103 mEq/L (ref 96–112)
Creatinine, Ser: 0.94 mg/dL (ref 0.40–1.20)
GFR: 57.99 mL/min — ABNORMAL LOW (ref >60.00)
Glucose, Bld: 90 mg/dL (ref 70–99)
Potassium: 4.2 mEq/L (ref 3.5–5.1)
Sodium: 138 mEq/L (ref 135–145)
Total Bilirubin: 0.7 mg/dL (ref 0.2–1.2)
Total Protein: 6.9 g/dL (ref 6.0–8.3)

## 2020-04-29 LAB — CBC WITH DIFFERENTIAL/PLATELET
Basophils Absolute: 0.1 10*3/uL (ref 0.0–0.1)
Basophils Relative: 1 % (ref 0.0–3.0)
Eosinophils Absolute: 0.1 10*3/uL (ref 0.0–0.7)
Eosinophils Relative: 1.9 % (ref 0.0–5.0)
HCT: 40.1 % (ref 36.0–46.0)
Hemoglobin: 13.6 g/dL (ref 12.0–15.0)
Lymphocytes Relative: 20.5 % (ref 12.0–46.0)
Lymphs Abs: 1.1 10*3/uL (ref 0.7–4.0)
MCHC: 33.9 g/dL (ref 30.0–36.0)
MCV: 88 fl (ref 78.0–100.0)
Monocytes Absolute: 0.4 10*3/uL (ref 0.1–1.0)
Monocytes Relative: 8.3 % (ref 3.0–12.0)
Neutro Abs: 3.6 10*3/uL (ref 1.4–7.7)
Neutrophils Relative %: 68.3 % (ref 43.0–77.0)
Platelets: 197 10*3/uL (ref 150.0–400.0)
RBC: 4.55 Mil/uL (ref 3.87–5.11)
RDW: 13.5 % (ref 11.5–15.5)
WBC: 5.2 10*3/uL (ref 4.0–10.5)

## 2020-04-29 LAB — TSH: TSH: 0.7 u[IU]/mL (ref 0.35–4.50)

## 2020-04-29 MED ORDER — LEVOTHYROXINE SODIUM 100 MCG PO TABS: 100.0000 ug | ORAL_TABLET | Freq: Every day | ORAL | 3 refills | Status: DC

## 2020-04-29 NOTE — Addendum Note (Signed)
Addended by: Asencion Partridge on: 04/29/2020 12:19 PM   Modules accepted: Orders

## 2020-04-29 NOTE — Patient Instructions (Signed)
Please return in 12 months for your annual complete physical; please come fasting.  I will release your lab results to you on your MyChart account with further instructions. Please reply with any questions.   We will get you scheduled for PT.  I have ordered a mammogram and/or bone density for you as we discussed today: [x]   Mammogram  [x]   Bone Density  Please call the office checked below to schedule your appointment: Your appointment will at the following location  [x]   [facility]      [address] [place],        [address]         []   [facility]  [address] [place],  [facility]  If you have any questions or concerns, please don't hesitate to send me a message via MyChart or call the office at [phone]. Thank you for visiting with [place] today! It's our pleasure caring for you.  Glad you are doing well. Hope you have an amazing year!

## 2020-04-29 NOTE — Progress Notes (Signed)
Subjective  Chief Complaint  Patient presents with  . Annual Exam    fasting  . Health Maintenance    Patient is requesting order for mammogram     HPI: Martha Gilmore is a 80 y.o. female who presents to [facility] [place] today for a Female Wellness Visit. She also has the concerns and/or needs as listed above in the chief complaint. These will be addressed in addition to the Health Maintenance Visit.   Wellness Visit: annual visit with health maintenance review and exam without Pap   Health maintenance: Overdue for mammogram.  Due for bone density follow-up.  Overall she is doing well.  Trying to stay safe due to recent COVID surge.  She is fully vaccinated.  Started Noom diet plan.  She is down 4 pounds Chronic disease f/u and/or acute problem visit: (deemed necessary to be done in addition to the wellness visit):  Post ablative hypothyroidism: Due for recheck.  Has been well controlled.  Takes low-dose Synthroid daily.  Compliant with medications.  No energy changes or symptoms of high or low thyroid.  Mixed hyperlipidemia: On statin.  Well-tolerated.  Fasting today for recheck.  No adverse effects.  Depression is well controlled on Celexa  Overactive bladder symptoms are improved with medications.  Gets up about once nightly.  No urinary incontinence.  GERD symptoms are well controlled without epigastric pain or melena  Low back pain improved after steroid injections from orthopedics.  She is seeing a chiropractor who recommends physical therapy for pelvic and lumbar stabilization.  Assessment  1. Postablative hypothyroidism   2. Mixed hyperlipidemia   3. Major depression, chronic   4. Osteopenia, unspecified location   5. OAB (overactive bladder)   6. Gastroesophageal reflux disease without esophagitis   7. Encounter for screening mammogram for breast cancer   8. Asymptomatic menopausal state   9. DDD (degenerative disc disease), lumbosacral       Plan  Female Wellness Visit:  Age appropriate Health Maintenance and Prevention measures were discussed with patient. Included topics are cancer screening recommendations, ways to keep healthy (see AVS) including dietary and exercise recommendations, regular eye and dental care, use of seat belts, and avoidance of moderate alcohol use and tobacco use.  Mammogram ordered for breast cancer screening  BMI: discussed patient's BMI and encouraged positive lifestyle modifications to help get to or maintain a target BMI.  HM needs and immunizations were addressed and ordered. See below for orders. See HM and immunization section for updates.  Up-to-date  Routine labs and screening tests ordered including cmp, cbc and lipids where appropriate.  Discussed recommendations regarding Vit D and calcium supplementation (see AVS)  Chronic disease management visit and/or acute problem visit:  Hypothyroidism due for recheck, clinically euthyroid   Mixed hyperlipidemia here for fasting lipids on statin.  Recheck LFTs.  Depression, overactive bladder, GERD are all well controlled.  No changes in medications.  Overactive bladder: Improved.  Continue medications  Osteopenia: Bone density ordered.  Continue vitamin D calcium and weightbearing exercises.  If stable can space out to every 3 year screens  Back pain due to lumbar DJD: Improved overall after steroid injections.  Continue chiropractic therapy and start physical therapy.   Follow up: 12 months for complete physical Orders Placed This Encounter  Procedures  . MM DIGITAL SCREENING BILATERAL  . DG Bone Density  . CBC with Differential/Platelet  . Comprehensive metabolic panel  . Lipid panel  . TSH  . Ambulatory  referral to Physical Therapy   No orders of the defined types were placed in this encounter.     Lifestyle: Body mass index is 33.44 kg/m. Wt Readings from Last 3 Encounters:  04/29/20 171 lb 3.2 oz (77.7 kg)  01/06/20 168  lb (76.2 kg)  10/15/19 170 lb (77.1 kg)    Patient Active Problem List   Diagnosis Date Noted  . Postablative hypothyroidism 12/20/2015    Priority: High  . Chondromalacia, knee, right 11/05/2017    Priority: Medium  . Osteoarthrosis, localized, primary, knee, right 11/05/2017    Priority: Medium  . Major depression, chronic 04/16/2017    Priority: Medium  . Mixed hyperlipidemia 04/16/2017    Priority: Medium  . History of Graves' disease 03/22/2015    Priority: Medium    Overview:  Treated with RAI-131 01/2015 Dr Steffanie Dunn   . GERD (gastroesophageal reflux disease) 12/13/2012    Priority: Medium    EGD 09/2016 - Dr. Benson Norway, normal   . Osteopenia 08/05/2012    Priority: Medium    DEXA 04/2018: stable osteopenia. Recheck 2-3 years.  DEXA 01/2015: T = -1.5, osteopenia; rec calcium and vit D and exercise. Recheck 2-3 years. Bone density scan April 2014-mild osteopenia, T equals -1.1   . DDD (degenerative disc disease), lumbosacral 10/22/2010    Priority: Medium  . Bilateral sensorineural hearing loss 02/06/2016    Priority: Low    Overview:  [facility] review 2017   . OAB (overactive bladder) 12/16/2013    Priority: Low  . Osteoarthritis of [place] joint of thumb 08/13/2012    Priority: Low  . Seasonal allergies 08/13/2012    Priority: Low  . Urge and stress incontinence 08/13/2012    Priority: Low  . Facet degeneration of lumbar region 01/06/2020  . Clostridium difficile infection 04/16/2017    H/o C.diff due to clindamicyin 09/2016; two rounds of treatment for cure. Dr. Benson Norway   . Family history of early CAD 04/16/2017    Treadmill test 04/2016 - equivocal: didn't reach maximal HR; otherwise negative.     Health Maintenance  Topic Date Due  . MAMMOGRAM  04/02/2020  . DEXA SCAN  05/13/2020  . TETANUS/TDAP  08/16/2020  . INFLUENZA VACCINE  Completed  . COVID-19 Vaccine  Completed  . PNA vac Low Risk Adult  Completed  . Hepatitis C Screening  Discontinued    Immunization History  Administered Date(s) Administered  . Influenza Split 01/15/2012  . Influenza, High Dose Seasonal PF 01/30/2013, 01/05/2015, 02/17/2016, 02/02/2017, 01/18/2018, 12/11/2018, 12/23/2019  . Influenza, Seasonal, Injecte, Preservative Fre 01/21/2014  . Influenza-Unspecified 01/09/2011, 01/15/2012, 01/21/2014, 01/22/2018  . PFIZER SARS-COV-2 Vaccination 05/15/2019, 06/05/2019, 01/20/2020  . Pneumococcal Conjugate-13 01/05/2015  . Pneumococcal Polysaccharide-23 04/24/2006, 11/18/2007, 05/30/2012  . Tdap 08/17/2010  . Zoster 04/25/1995  . Zoster Recombinat (Shingrix) 06/29/2018, 11/01/2018   We updated and reviewed the patient's past history in detail and it is documented below. Allergies: Patient is allergic to aspirin, meperidine, penicillins, statins, cephalexin, clindamycin/lincomycin, demerol  [meperidine hcl], and tape. Past Medical History Patient  has a past medical history of Cataracts, bilateral (09/23/2015), Clostridium difficile infection (04/16/2017), Depression, Hyperlipidemia, Hypertension, OAB (overactive bladder) (12/16/2013), Osteopenia (08/05/2012), Seasonal allergies, and Urge and stress incontinence. Past Surgical History Patient  has a past surgical history that includes Cholecystectomy; Cesarean section; Teeth Implants; Appendectomy (1971); Trigger finger release; and Vaginal hysterectomy. Family History: Patient family history includes Brain cancer in her brother; Cancer in her father; Diabetes in her sister; Heart disease in her father, maternal  grandmother, mother, and sister; Kidney cancer in her sister; Lung cancer in her sister. Social History:  Patient  reports that she has never smoked. She has never used smokeless tobacco. She reports that she does not drink alcohol and does not use drugs.  Review of Systems: Constitutional: negative for fever or malaise Ophthalmic: negative for photophobia, double vision or loss of vision Cardiovascular:  negative for chest pain, dyspnea on exertion, or new LE swelling Respiratory: negative for SOB or persistent cough Gastrointestinal: negative for abdominal pain, change in bowel habits or melena Genitourinary: negative for dysuria or gross hematuria, no abnormal uterine bleeding or disharge Musculoskeletal: negative for new gait disturbance or muscular weakness Integumentary: negative for new or persistent rashes, no breast lumps Neurological: negative for TIA or stroke symptoms Psychiatric: negative for SI or delusions Allergic/Immunologic: negative for hives  Patient Care Team    Relationship Specialty Notifications Start End  Leamon Arnt, MD PCP - General Family Medicine  03/21/16   Susette Racer, MD Consulting Physician Endocrinology  04/16/17   Carol Ada, MD Consulting Physician Gastroenterology  04/16/17   Roseanne Kaufman, MD Consulting Physician Orthopedic Surgery  02/28/18   Rolm Bookbinder, MD Consulting Physician Dermatology  02/28/18   Stephannie Li, [place] Physician Ophthalmology  03/17/19   [facility] Consulting Physician Audiology  03/17/19     Objective  Vitals: BP 122/70   Pulse 68   Temp 97.8 F (36.6 C) (Temporal)   Wt 171 lb 3.2 oz (77.7 kg)   SpO2 98%   BMI 33.44 kg/m  General:  Well developed, well nourished, no acute distress  Psych:  Alert and orientedx3,normal mood and affect HEENT:  Normocephalic, atraumatic, non-icteric sclera,  supple neck without adenopathy, mass or thyromegaly Cardiovascular:  Normal S1, S2, RRR without gallop, rub or murmur Respiratory:  Good breath sounds bilaterally, CTAB with normal respiratory effort Gastrointestinal: normal bowel sounds, soft, non-tender, no noted masses. No HSM MSK: no deformities, contusions. Joints are without erythema or swelling.  Skin:  Warm, no rashes or suspicious lesions noted Neurologic:    Mental status is normal. CN 2-11 are normal. Gross motor and sensory  exams are normal. Normal gait. No tremor   Commons side effects, risks, benefits, and alternatives for medications and treatment plan prescribed today were discussed, and the patient expressed understanding of the given instructions. Patient is instructed to call or message via MyChart if he/she has any questions or concerns regarding our treatment plan. No barriers to understanding were identified. We discussed Red Flag symptoms and signs in detail. Patient expressed understanding regarding what to do in case of urgent or emergency type symptoms.   Medication list was reconciled, printed and provided to the patient in AVS. Patient instructions and summary information was reviewed with the patient as documented in the AVS. This note was prepared with assistance of Dragon voice recognition software. Occasional wrong-word or sound-a-like substitutions may have occurred due to the inherent limitations of voice recognition software  This visit occurred during the SARS-CoV-2 public health emergency.  Safety protocols were in place, including screening questions prior to the visit, additional usage of staff PPE, and extensive cleaning of exam room while observing appropriate contact time as indicated for disinfecting solutions.

## 2020-04-30 ENCOUNTER — Ambulatory Visit
Admission: RE | Admit: 2020-04-30 | Discharge: 2020-04-30 | Disposition: A | Discharge status: Home / Self Care | Payer: Medicare Other | Source: Ambulatory Visit | Attending: Family Medicine | Admitting: Family Medicine

## 2020-04-30 DIAGNOSIS — Z1231 Encounter for screening mammogram for malignant neoplasm of breast: Secondary | ICD-10-CM | POA: Diagnosis not present

## 2020-04-30 IMAGING — MG DIGITAL SCREENING BILAT W/ TOMO W/ CAD
8 of 14 series · 8 of 40 positions shown · non-contrast
Comparison: Previous exam(s).

ACR Breast Density Category a: The breast tissue is almost entirely
fatty.

CLINICAL DATA: Screening.

EXAM:
DIGITAL SCREENING BILATERAL MAMMOGRAM WITH TOMO AND CAD

[L CC synth-2D (1 of 2)]
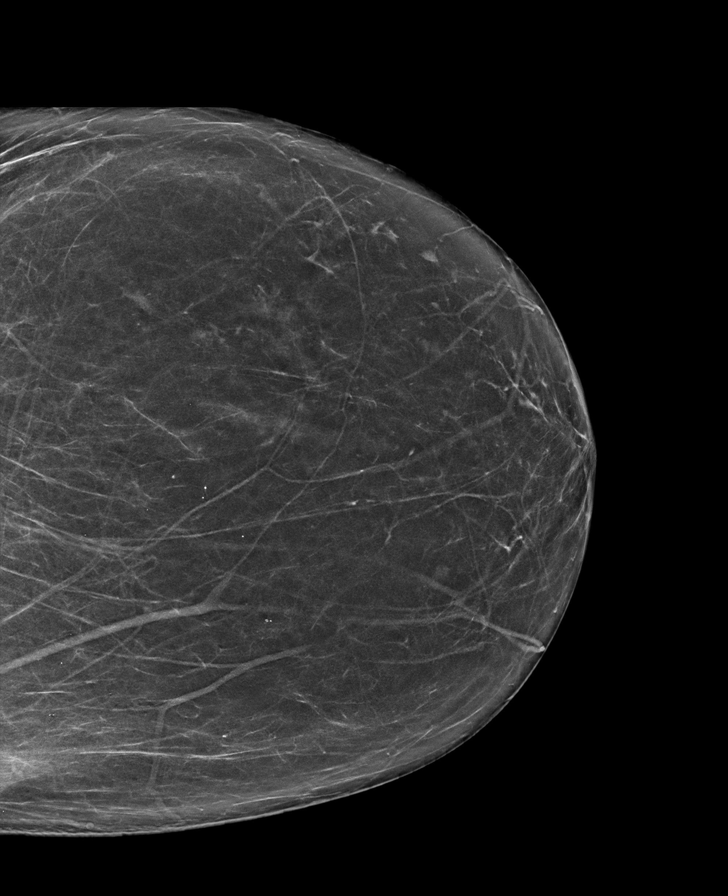

[R CV synth-2D]
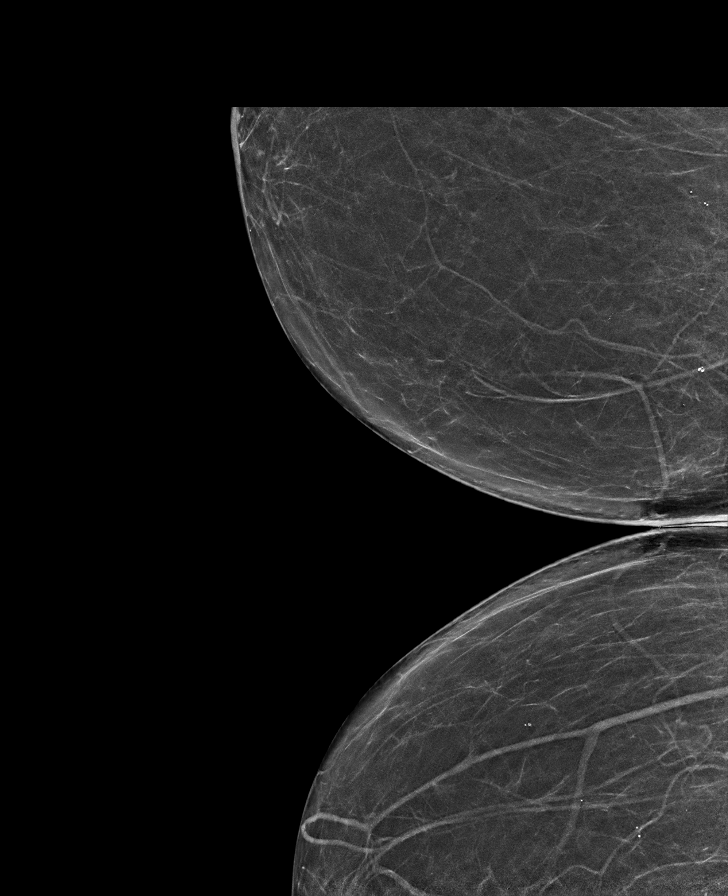

[R CC synth-2D]
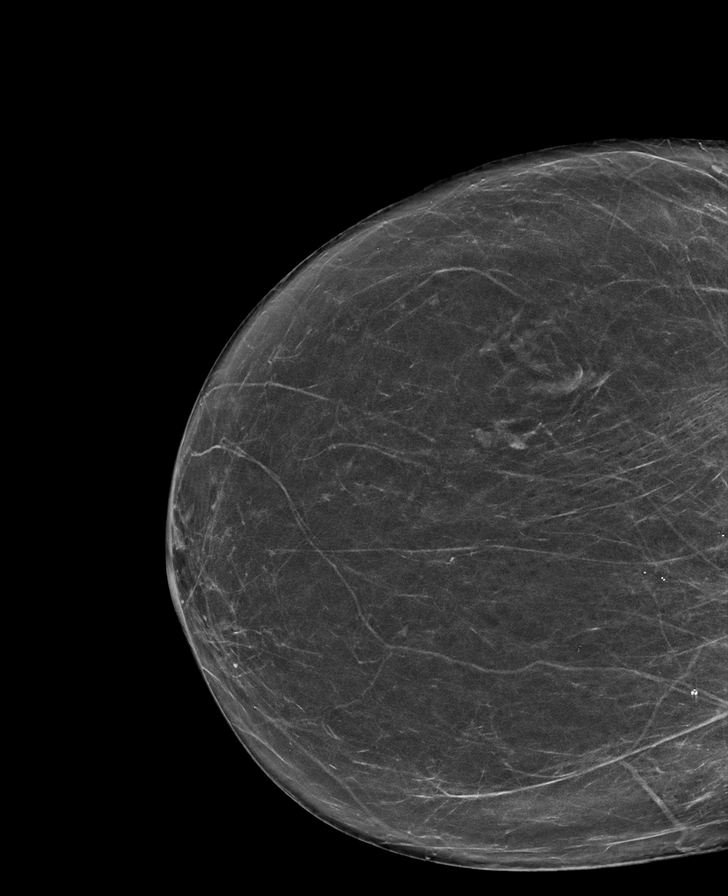

[L MLO synth-2D]
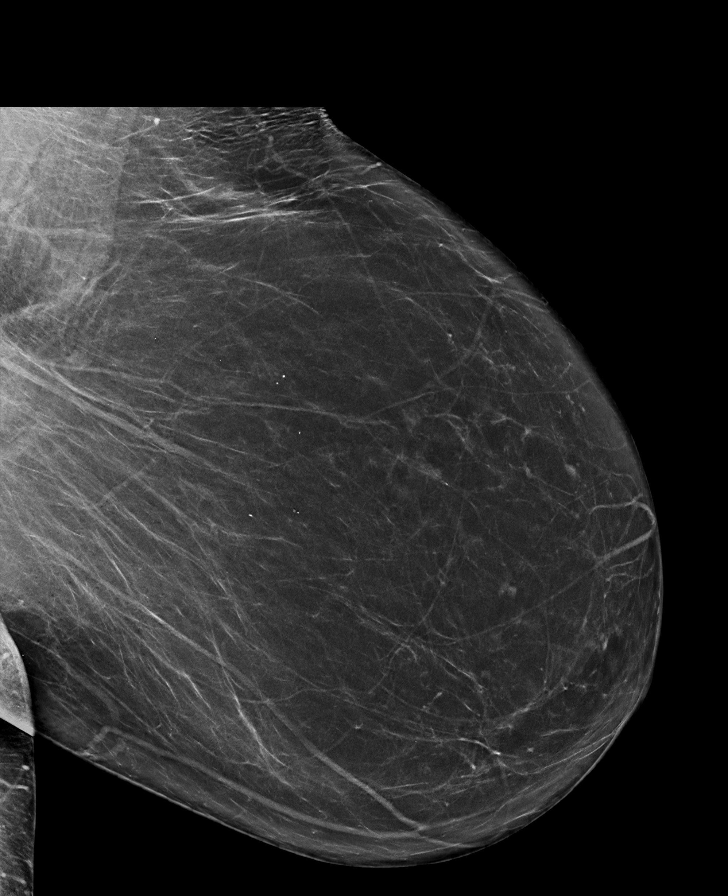

[L CC synth-2D (2 of 2)]
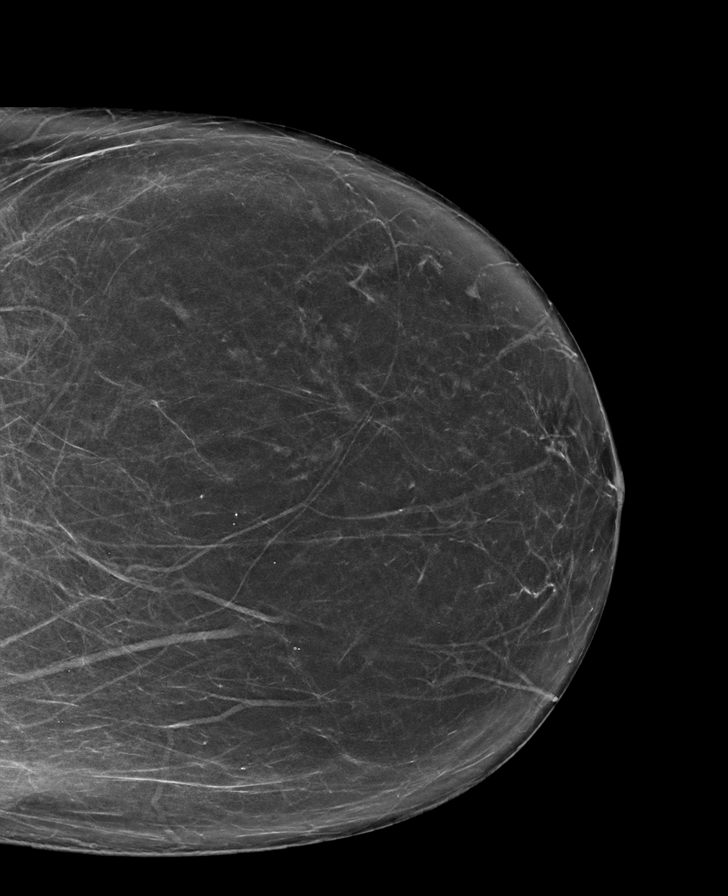

[R MLO synth-2D (1 of 2)]
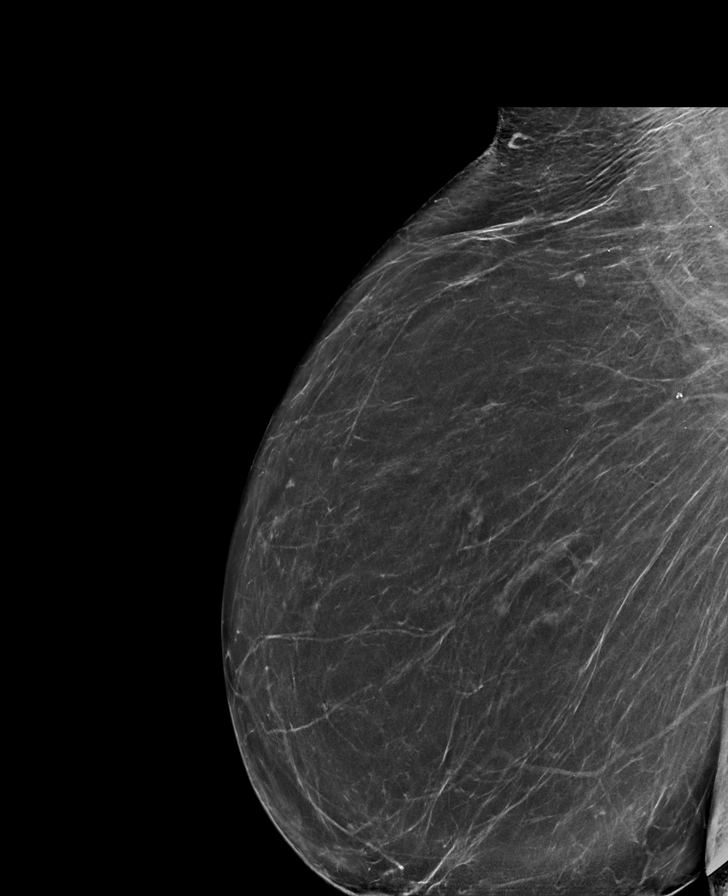

[R MLO synth-2D (2 of 2)]
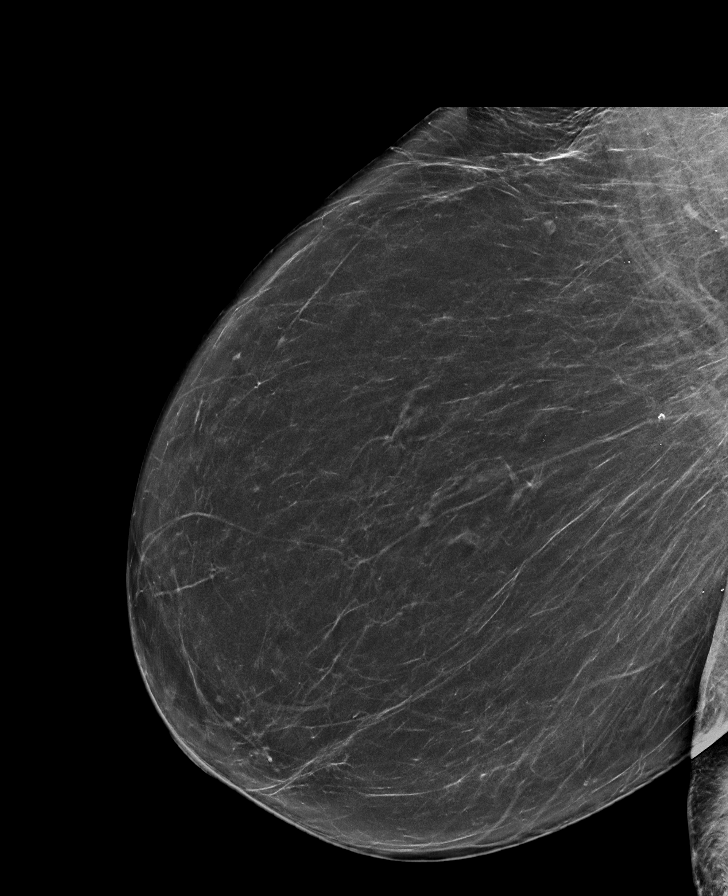

[R MLO tomo · tomo slice 38/75.0]
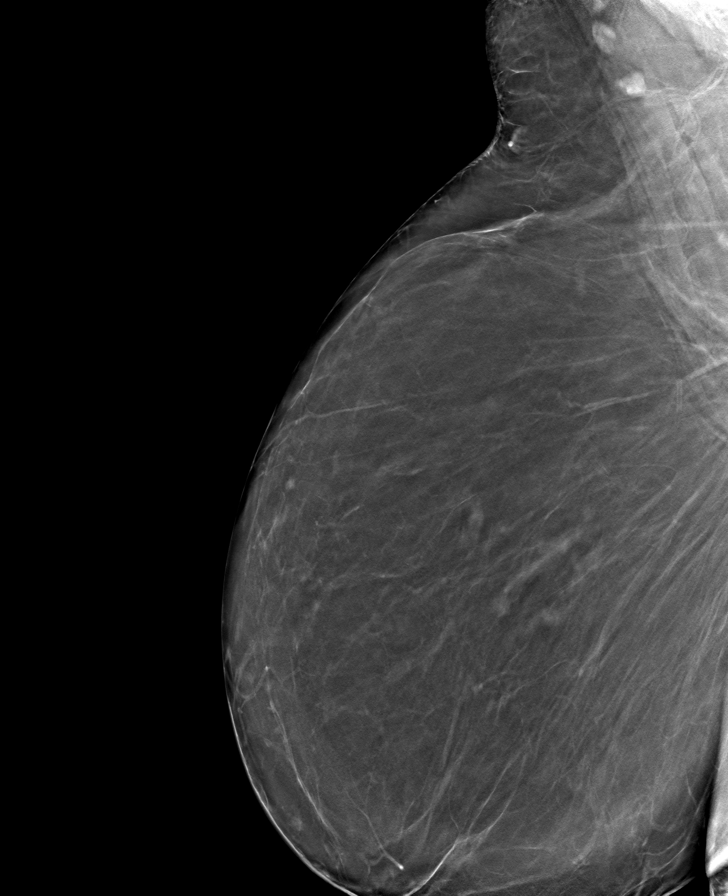

[8 of 40 positions shown; findings below may reference images not displayed]

FINDINGS: There are no findings suspicious for malignancy. Images were
processed with CAD.
IMPRESSION: No mammographic evidence of malignancy. A result letter of this
screening mammogram will be mailed directly to the patient.

RECOMMENDATION:
Screening mammogram in one year. (Code:[TA])

BI-RADS CATEGORY  1: Negative.

## 2020-05-03 ENCOUNTER — Encounter: Payer: Self-pay | Admitting: Family Medicine

## 2020-05-20 DIAGNOSIS — H00021 Hordeolum internum right upper eyelid: Secondary | ICD-10-CM | POA: Diagnosis not present

## 2020-06-03 ENCOUNTER — Ambulatory Visit (INDEPENDENT_AMBULATORY_CARE_PROVIDER_SITE_OTHER): Payer: Medicare Other

## 2020-06-03 ENCOUNTER — Other Ambulatory Visit: Payer: Self-pay

## 2020-06-03 DIAGNOSIS — Z0000 Encounter for general adult medical examination without abnormal findings: Secondary | ICD-10-CM

## 2020-06-03 NOTE — Patient Instructions (Addendum)
Martha Gilmore , Thank you for taking time to come for your Medicare Wellness Visit. I appreciate your ongoing commitment to your health goals. Please review the following plan we discussed and let me know if I can assist you in the future.   Screening recommendations/referrals: Colonoscopy: No longer required  Mammogram: Done 04/30/20 Bone Density: Done 05/13/18 Recommended yearly ophthalmology/optometry visit for glaucoma screening and checkup Recommended yearly dental visit for hygiene and checkup  Vaccinations: Influenza vaccine: Done 12/23/19 Up to date Pneumococcal vaccine: Up to date Tdap vaccine: Up to date Shingles vaccine: Completed 3/7 & 11/01/18   Covid-19:Completed 1/21, 2/11, & 01/20/20  Advanced directives: Please bring a copy of your health care power of attorney and living will to the office at your convenience.  Conditions/risks identified: lose weight   Next appointment: Follow up in one year for your annual wellness visit    Preventive Care 65 Years and Older, Female Preventive care refers to lifestyle choices and visits with your health care provider that can promote health and wellness. What does preventive care include?  A yearly physical exam. This is also called an annual well check.  Dental exams once or twice a year.  Routine eye exams. Ask your health care provider how often you should have your eyes checked.  Personal lifestyle choices, including:  Daily care of your teeth and gums.  Regular physical activity.  Eating a healthy diet.  Avoiding tobacco and drug use.  Limiting alcohol use.  Practicing safe sex.  Taking low-dose aspirin every day.  Taking vitamin and mineral supplements as recommended by your health care provider. What happens during an annual well check? The services and screenings done by your health care provider during your annual well check will depend on your age, overall health, lifestyle risk factors, and family history of  disease. Counseling  Your health care provider may ask you questions about your:  Alcohol use.  Tobacco use.  Drug use.  Emotional well-being.  Home and relationship well-being.  Sexual activity.  Eating habits.  History of falls.  Memory and ability to understand (cognition).  Work and work Statistician.  Reproductive health. Screening  You may have the following tests or measurements:  Height, weight, and BMI.  Blood pressure.  Lipid and cholesterol levels. These may be checked every 5 years, or more frequently if you are over 15 years old.  Skin check.  Lung cancer screening. You may have this screening every year starting at age 37 if you have a 30-pack-year history of smoking and currently smoke or have quit within the past 15 years.  Fecal occult blood test (FOBT) of the stool. You may have this test every year starting at age 52.  Flexible sigmoidoscopy or colonoscopy. You may have a sigmoidoscopy every 5 years or a colonoscopy every 10 years starting at age 12.  Hepatitis C blood test.  Hepatitis B blood test.  Sexually transmitted disease (STD) testing.  Diabetes screening. This is done by checking your blood sugar (glucose) after you have not eaten for a while (fasting). You may have this done every 1-3 years.  Bone density scan. This is done to screen for osteoporosis. You may have this done starting at age 84.  Mammogram. This may be done every 1-2 years. Talk to your health care provider about how often you should have regular mammograms. Talk with your health care provider about your test results, treatment options, and if necessary, the need for more tests. Vaccines  Your  health care provider may recommend certain vaccines, such as:  Influenza vaccine. This is recommended every year.  Tetanus, diphtheria, and acellular pertussis (Tdap, Td) vaccine. You may need a Td booster every 10 years.  Zoster vaccine. You may need this after age  9.  Pneumococcal 13-valent conjugate (PCV13) vaccine. One dose is recommended after age 54.  Pneumococcal polysaccharide (PPSV23) vaccine. One dose is recommended after age 68. Talk to your health care provider about which screenings and vaccines you need and how often you need them. This information is not intended to replace advice given to you by your health care provider. Make sure you discuss any questions you have with your health care provider. Document Released: 05/07/2015 Document Revised: 12/29/2015 Document Reviewed: 02/09/2015 Elsevier Interactive Patient Education  2017 [place] Prevention in the Home Falls can cause injuries. They can happen to people of all ages. There are many things you can do to make your home safe and to help prevent falls. What can I do on the outside of my home?  Regularly fix the edges of walkways and driveways and fix any cracks.  Remove anything that might make you trip as you walk through a door, such as a raised step or threshold.  Trim any bushes or trees on the path to your home.  Use bright outdoor lighting.  Clear any walking paths of anything that might make someone trip, such as rocks or tools.  Regularly check to see if handrails are loose or broken. Make sure that both sides of any steps have handrails.  Any raised decks and porches should have guardrails on the edges.  Have any leaves, snow, or ice cleared regularly.  Use sand or salt on walking paths during winter.  Clean up any spills in your garage right away. This includes oil or grease spills. What can I do in the bathroom?  Use night lights.  Install grab bars by the toilet and in the tub and shower. Do not use towel bars as grab bars.  Use non-skid mats or decals in the tub or shower.  If you need to sit down in the shower, use a plastic, non-slip stool.  Keep the floor dry. Clean up any water that spills on the floor as soon as it happens.  Remove  soap buildup in the tub or shower regularly.  Attach bath mats securely with double-sided non-slip rug tape.  Do not have throw rugs and other things on the floor that can make you trip. What can I do in the bedroom?  Use night lights.  Make sure that you have a light by your bed that is easy to reach.  Do not use any sheets or blankets that are too big for your bed. They should not hang down onto the floor.  Have a firm chair that has side arms. You can use this for support while you get dressed.  Do not have throw rugs and other things on the floor that can make you trip. What can I do in the kitchen?  Clean up any spills right away.  Avoid walking on wet floors.  Keep items that you use a lot in easy-to-reach places.  If you need to reach something above you, use a strong step stool that has a grab bar.  Keep electrical cords out of the way.  Do not use floor polish or wax that makes floors slippery. If you must use wax, use non-skid floor wax.  Do not  have throw rugs and other things on the floor that can make you trip. What can I do with my stairs?  Do not leave any items on the stairs.  Make sure that there are handrails on both sides of the stairs and use them. Fix handrails that are broken or loose. Make sure that handrails are as long as the stairways.  Check any carpeting to make sure that it is firmly attached to the stairs. Fix any carpet that is loose or worn.  Avoid having throw rugs at the top or bottom of the stairs. If you do have throw rugs, attach them to the floor with carpet tape.  Make sure that you have a light switch at the top of the stairs and the bottom of the stairs. If you do not have them, ask someone to add them for you. What else can I do to help prevent falls?  Wear shoes that:  Do not have high heels.  Have rubber bottoms.  Are comfortable and fit you well.  Are closed at the toe. Do not wear sandals.  If you use a  stepladder:  Make sure that it is fully opened. Do not climb a closed stepladder.  Make sure that both sides of the stepladder are locked into place.  Ask someone to hold it for you, if possible.  Clearly mark and make sure that you can see:  Any grab bars or handrails.  First and last steps.  Where the edge of each step is.  Use tools that help you move around (mobility aids) if they are needed. These include:  Canes.  Walkers.  Scooters.  Crutches.  Turn on the lights when you go into a dark area. Replace any light bulbs as soon as they burn out.  Set up your furniture so you have a clear path. Avoid moving your furniture around.  If any of your floors are uneven, fix them.  If there are any pets around you, be aware of where they are.  Review your medicines with your doctor. Some medicines can make you feel dizzy. This can increase your chance of falling. Ask your doctor what other things that you can do to help prevent falls. This information is not intended to replace advice given to you by your health care provider. Make sure you discuss any questions you have with your health care provider. Document Released: 02/04/2009 Document Revised: 09/16/2015 Document Reviewed: 05/15/2014 Elsevier Interactive Patient Education  2017 [employer].

## 2020-06-03 NOTE — Progress Notes (Signed)
Virtual Visit via Telephone Note  I connected with  Nancey San Marino on 06/03/20 at  1:45 PM EST by telephone and verified that I am speaking with the correct person using two identifiers.  Medicare Annual Wellness visit completed telephonically due to Covid-19 pandemic.   Persons participating in this call: This Health Coach and this patient.    Location: Patient: Home Provider: Office   I discussed the limitations, risks, security and privacy concerns of performing an evaluation and management service by telephone and the availability of in person appointments. The patient expressed understanding and agreed to proceed.  Unable to perform video visit due to video visit attempted and failed and/or patient does not have video capability.   Some vital signs may be absent or patient reported.   Willette Brace, LPN    Subjective:   Najia San Marino is a 80 y.o. female who presents for Medicare Annual (Subsequent) preventive examination.  Review of Systems     Cardiac Risk Factors include: advanced age (>87men, >31 women);obesity (BMI >30kg/m2);dyslipidemia     Objective:    There were no vitals filed for this visit. There is no height or weight on file to calculate BMI.  Advanced Directives 06/03/2020 03/17/2019 02/28/2018  Does Patient Have a Medical Advance Directive? Yes Yes Yes  Type of Paramedic of [place];Living will [place];Living will Living will;Healthcare Power of Reading in Chart? No - copy requested No - copy requested No - copy requested  Would patient like information on creating a medical advance directive? - No - Patient declined -    Current Medications (verified) Outpatient Encounter Medications as of 06/03/2020  Medication Sig  . atorvastatin (LIPITOR) 10 MG tablet TAKE 1 TABLET BY MOUTH EVERYDAY AT BEDTIME  . citalopram (CELEXA) 20 MG tablet TAKE 1 TABLET BY MOUTH EVERY  DAY  . levothyroxine (SYNTHROID) 100 MCG tablet Take 1 tablet (100 mcg total) by mouth daily.  . Multiple Vitamins-Minerals (PRESERVISION AREDS PO) Take by mouth.  Marland Kitchen omeprazole (PRILOSEC) 20 MG capsule TAKE 1 CAPSULE BY MOUTH EVERY DAY  . oxybutynin (DITROPAN-XL) 10 MG 24 hr tablet TAKE ONE TABLET BY MOUTH DAILY AT BEDTIME  . Polyethyl Glycol-Propyl Glycol (SYSTANE OP) Apply 1 drop to eye as needed.   No facility-administered encounter medications on file as of 06/03/2020.    Allergies (verified) Aspirin, Meperidine, Penicillins, Statins, Cephalexin, Clindamycin/lincomycin, Demerol  [meperidine hcl], and Tape   History: Past Medical History:  Diagnosis Date  . Cataracts, bilateral 09/23/2015  . Clostridium difficile infection 04/16/2017   H/o C.diff due to clindamicyin 09/2016; two rounds of treatment for cure. Dr. Benson Norway  . Depression   . Hyperlipidemia   . Hypertension   . OAB (overactive bladder) 12/16/2013  . Osteopenia 08/05/2012   Dexa scan 07/2012 mild osteopenia, T = -1.1  . Seasonal allergies   . Urge and stress incontinence    Past Surgical History:  Procedure Laterality Date  . APPENDECTOMY  1971  . CESAREAN SECTION    . CHOLECYSTECTOMY    . Teeth Implants    . TRIGGER FINGER RELEASE    . VAGINAL HYSTERECTOMY     Family History  Problem Relation Age of Onset  . Heart disease Mother   . Cancer Father   . Heart disease Father   . Lung cancer Sister   . Heart disease Sister   . Diabetes Sister   . Brain cancer Brother   .  Heart disease Maternal Grandmother   . Kidney cancer Sister    Social History   Socioeconomic History  . Marital status: Widowed    Spouse name: Not on file  . Number of children: Not on file  . Years of education: Not on file  . Highest education level: Not on file  Occupational History  . Not on file  Tobacco Use  . Smoking status: Never Smoker  . Smokeless tobacco: Never Used  Vaping Use  . Vaping Use: Never used  Substance and  Sexual Activity  . Alcohol use: No  . Drug use: No  . Sexual activity: Never    Partners: Male  Other Topics Concern  . Not on file  Social History Narrative  . Not on file   Social Determinants of Health   Financial Resource Strain: Low Risk   . Difficulty of Paying Living Expenses: Not hard at all  Food Insecurity: No Food Insecurity  . Worried About Charity fundraiser in the Last Year: Never true  . Ran Out of Food in the Last Year: Never true  Transportation Needs: No Transportation Needs  . Lack of Transportation (Medical): No  . Lack of Transportation (Non-Medical): No  Physical Activity: Inactive  . Days of Exercise per Week: 0 days  . Minutes of Exercise per Session: 0 min  Stress: No Stress Concern Present  . Feeling of Stress : Not at all  Social Connections: Moderately Integrated  . Frequency of Communication with Friends and Family: More than three times a week  . Frequency of Social Gatherings with Friends and Family: Once a week  . Attends Religious Services: More than 4 times per year  . Active Member of Clubs or Organizations: Yes  . Attends Archivist Meetings: 1 to 4 times per year  . Marital Status: Widowed    Tobacco Counseling Counseling given: Not Answered   Clinical Intake:  Pre-visit preparation completed: Yes  Pain : No/denies pain     BMI - recorded: 33.44 Nutritional Status: BMI > 30  Obese Nutritional Risks: None Diabetes: No  How often do you need to have someone help you when you read instructions, pamphlets, or other written materials from your doctor or pharmacy?: 1 - Never  Diabetic?No  Interpreter Needed?: No  Information entered by :: Charlott Rakes, LPN   Activities of Daily Living In your present state of health, do you have any difficulty performing the following activities: 06/03/2020 04/29/2020  Hearing? Y N  Comment wears hearing aids -  Vision? N N  Difficulty concentrating or making decisions? N N   Walking or climbing stairs? N N  Dressing or bathing? N N  Doing errands, shopping? N N  Preparing Food and eating ? N -  Using the Toilet? N -  In the past six months, have you accidently leaked urine? Y -  Comment wears a pad for incontinence at times -  Do you have problems with loss of bowel control? N -  Managing your Medications? N -  Managing your Finances? N -  Housekeeping or managing your Housekeeping? N -  Some recent data might be hidden    Patient Care Team: Leamon Arnt, MD as PCP - General (Family Medicine) Susette Racer, MD as Consulting Physician (Endocrinology) Carol Ada, MD as Consulting Physician (Gastroenterology) Roseanne Kaufman, MD as Consulting Physician (Orthopedic Surgery) Rolm Bookbinder, MD as Consulting Physician (Dermatology) Stephannie Li, OD as Consulting Physician (Ophthalmology) [facility]  Service as Financial risk analyst Physician (Audiology)  Indicate any recent Medical Services you may have received from other than [facility] providers in the past year (date may be approximate).     Assessment:   This is a routine wellness examination for [place].  Hearing/Vision screen  Hearing Screening   125Hz  250Hz  500Hz  1000Hz  2000Hz  3000Hz  4000Hz  6000Hz  8000Hz   Right ear:           Left ear:           Comments: Pt trying out new hearing aids   Vision Screening Comments: Pt follows up with Dr Jac Canavan for eye exams   Dietary issues and exercise activities discussed: Current Exercise Habits: The patient does not participate in regular exercise at present  Goals    . Increase physical activity     Increase activity    . Patient Stated     Lose weight       Depression Screen PHQ 2/9 Scores 06/03/2020 04/29/2020 03/17/2019 02/28/2018 11/05/2017 04/16/2017  PHQ - 2 Score 0 0 0 0 0 0  PHQ- 9 Score - - - 3 0 -    Fall Risk Fall Risk  06/03/2020 04/29/2020 08/29/2019 03/17/2019 02/28/2018  Falls in the past year? 0 0 1 0 0  Number  falls in past yr: 0 0 0 - -  Injury with Fall? 0 0 0 0 -  Risk for fall due to : Impaired vision;Impaired balance/gait - - - -  Follow up Falls prevention discussed - - Falls evaluation completed;Education provided;Falls prevention discussed -    FALL RISK PREVENTION PERTAINING TO THE HOME:  Any stairs in or around the home? Yes  If so, are there any without handrails? No  Home free of loose throw rugs in walkways, pet beds, electrical cords, etc? Yes  Adequate lighting in your home to reduce risk of falls? Yes   ASSISTIVE DEVICES UTILIZED TO PREVENT FALLS:  Life alert? Yes  Use of a cane, walker or w/c? No  Grab bars in the bathroom? No  Shower chair or bench in shower? No  Elevated toilet seat or a handicapped toilet? Yes   TIMED UP AND GO:  Was the test performed? No     Cognitive Function: MMSE - Mini Mental State Exam 02/28/2018  Orientation to time 5  Orientation to Place 5  Registration 3  Attention/ Calculation 5  Recall 2  Language- name 2 objects 2  Language- repeat 1  Language- follow 3 step command 3  Language- read & follow direction 1  Write a sentence 1  Copy design 1  Total score 29     6CIT Screen 06/03/2020  What Year? 0 points  What month? 0 points  Count back from 20 0 points  Months in reverse 0 points  Repeat phrase 2 points    Immunizations Immunization History  Administered Date(s) Administered  . Influenza Split 01/15/2012  . Influenza, High Dose Seasonal PF 01/30/2013, 01/05/2015, 02/17/2016, 02/02/2017, 01/18/2018, 12/11/2018, 12/23/2019  . Influenza, Seasonal, Injecte, Preservative Fre 01/21/2014  . Influenza-Unspecified 01/09/2011, 01/15/2012, 01/21/2014, 01/22/2018  . PFIZER(Purple Top)SARS-COV-2 Vaccination 05/15/2019, 06/05/2019, 01/20/2020  . Pneumococcal Conjugate-13 01/05/2015  . Pneumococcal Polysaccharide-23 04/24/2006, 11/18/2007, 05/30/2012  . Tdap 08/17/2010  . Zoster 04/25/1995  . Zoster Recombinat (Shingrix)  06/29/2018, 11/01/2018    TDAP status: Up to date  Flu Vaccine status: Up to date done 12/23/19  Pneumococcal vaccine status: Up to date  Covid-19 vaccine status: Completed vaccines  Qualifies for Shingles Vaccine? Yes  Zostavax completed Yes   Shingrix Completed?: Yes  Screening Tests Health Maintenance  Topic Date Due  . DEXA SCAN  05/13/2020  . TETANUS/TDAP  08/16/2020  . MAMMOGRAM  04/30/2021  . INFLUENZA VACCINE  Completed  . COVID-19 Vaccine  Completed  . PNA vac Low Risk Adult  Completed  . Hepatitis C Screening  Discontinued    Health Maintenance  Health Maintenance Due  Topic Date Due  . DEXA SCAN  05/13/2020    Colorectal cancer screening: No longer required.   Mammogram status: Completed 04/30/20. Repeat every year  Bone Density status: Completed 05/13/18. Results reflect: Bone density results: OSTEOPENIA. Repeat every 2 years.  Additional Screening:  Hepatitis C Screening: does not qualify  Vision Screening: Recommended annual ophthalmology exams for early detection of glaucoma and other disorders of the eye. Is the patient up to date with their annual eye exam?  Yes  Who is the provider or what is the name of the office in which the patient attends annual eye exams? Dr Jac Canavan If pt is not established with a provider, would they like to be referred to a provider to establish care? No .   Dental Screening: Recommended annual dental exams for proper oral hygiene  Community Resource Referral / Chronic Care Management: CRR required this visit?  No   CCM required this visit?  No      Plan:     I have personally reviewed and noted the following in the patient's chart:   . Medical and social history . Use of alcohol, tobacco or illicit drugs  . Current medications and supplements . Functional ability and status . Nutritional status . Physical activity . Advanced directives . List of other physicians . Hospitalizations, surgeries, and  ER visits in previous 12 months . Vitals . Screenings to include cognitive, depression, and falls . Referrals and appointments  In addition, I have reviewed and discussed with patient certain preventive protocols, quality metrics, and best practice recommendations. A written personalized care plan for preventive services as well as general preventive health recommendations were provided to patient.     Willette Brace, LPN   [id number]/61/0960   Nurse Notes: None

## 2020-06-07 ENCOUNTER — Ambulatory Visit: Payer: Medicare Other | Admitting: Physical Therapy

## 2020-06-08 ENCOUNTER — Encounter: Payer: Self-pay | Admitting: Physical Medicine and Rehabilitation

## 2020-06-08 NOTE — Procedures (Signed)
Lumbar Epidural Steroid Injection - Interlaminar Approach with Fluoroscopic Guidance  Patient: Martha Gilmore      Date of Birth: 1940-09-12 MRN: [id number] PCP: Leamon Arnt, MD      Visit Date: 04/14/2020   Universal Protocol:     Consent Given By: the patient  Position: PRONE  Additional Comments: Vital signs were monitored before and after the procedure. Patient was prepped and draped in the usual sterile fashion. The correct patient, procedure, and site was verified.   Injection Procedure Details:   Procedure diagnoses: Spinal stenosis of lumbar region with neurogenic claudication [M48.062]   Meds Administered:  Meds ordered this encounter  Medications  . DISCONTD: betamethasone acetate-betamethasone sodium phosphate (CELESTONE) injection 12 mg     Laterality: Right  Location/Site:  L4-L5  Needle: 3.5 in., 20 ga. Tuohy  Needle Placement: Paramedian epidural  Findings:   -Comments: Excellent flow of contrast into the epidural space.  Procedure Details: Using a paramedian approach from the side mentioned above, the region overlying the inferior lamina was localized under fluoroscopic visualization and the soft tissues overlying this structure were infiltrated with 4 ml. of 1% Lidocaine without Epinephrine. The Tuohy needle was inserted into the epidural space using a paramedian approach.   The epidural space was localized using loss of resistance along with counter oblique bi-planar fluoroscopic views.  After negative aspirate for air, blood, and CSF, a 2 ml. volume of Isovue-250 was injected into the epidural space and the flow of contrast was observed. Radiographs were obtained for documentation purposes.    The injectate was administered into the level noted above.   Additional Comments:  The patient tolerated the procedure well Dressing: 2 x 2 sterile gauze and Band-Aid    Post-procedure details: Patient was observed during the  procedure. Post-procedure instructions were reviewed.  Patient left the clinic in stable condition.

## 2020-06-08 NOTE — Progress Notes (Signed)
Martha Gilmore - 80 y.o. female MRN [id number]  Date of birth: Jun 17, 1940  Office Visit Note: Visit Date: 04/14/2020 PCP: Leamon Arnt, MD Referred by: Leamon Arnt, MD  Subjective: Chief Complaint  Patient presents with  . Lower Back - Pain   HPI:  Martha Gilmore is a 80 y.o. female who comes in today For continued evaluation and management of chronic worsening low back pain quite severe at times that does limit her daily activities.  Reports worst pain is an 8 out of 10 with constant pain with sitting standing moving in general.  She has been followed by Dr. Leana Gamer from a spine standpoint.  No history of lumbar surgery.  History of intolerances to multiple medications.  Lumbar spine imaging showing significant facet arthropathy throughout the lumbar spine.  There is moderate narrowing at L4-5 but no nerve compression.  Patient not had any specific radicular pain but does get some pain into the buttock and hip region.  She did well with medial branch blocks diagnostically in a double block paradigm and these gave her good relief temporarily and increased her function.  We went on to complete radiofrequency ablation of the same facet joint's and medial branches and she reports this has helped but she still having significant pain.  She feels like after the procedure she can stand longer and move a little better but still having significant pain.  She has tried and failed all manner of conservative care and is not really interested in any surgical procedures at this point.  She has had no focal weakness no new trauma etc.  Review of Systems  Constitutional: Negative for chills, fever, malaise/fatigue and weight loss.  HENT: Negative for hearing loss and sinus pain.   Eyes: Negative for blurred vision, double vision and photophobia.  Respiratory: Negative for cough and shortness of breath.   Cardiovascular: Negative for chest pain, palpitations and leg swelling.  Gastrointestinal:  Negative for abdominal pain, nausea and vomiting.  Genitourinary: Negative for flank pain.  Musculoskeletal: Positive for back pain. Negative for myalgias.  Skin: Negative for itching and rash.  Neurological: Negative for tremors, focal weakness and weakness.  Endo/Heme/Allergies: Negative.   Psychiatric/Behavioral: Negative for depression.  All other systems reviewed and are negative.  Otherwise per HPI.  Assessment & Plan: Visit Diagnoses:    ICD-10-CM   1. Spinal stenosis of lumbar region with neurogenic claudication  M48.062 XR C-ARM NO REPORT    Epidural Steroid injection    DISCONTINUED: betamethasone acetate-betamethasone sodium phosphate (CELESTONE) injection 12 mg  2. Spondylosis without myelopathy or radiculopathy, lumbar region  M47.816   3. Chronic bilateral low back pain without sciatica  M54.50    G89.29   4. Chronic pain syndrome  G89.4     Plan: Findings:  Chronic history of low back pain some referral in the hips nothing really down the legs.  No history of lumbar surgery no history of fibromyalgia but with multilevel spondylosis and facet arthropathy with some moderate narrowing at L4-5.  Patient did well diagnostically with facet joint/medial branch blocks and we went on to complete radiofrequency ablation.  She is 4 weeks out from the ablation and does report some increased function from that but still considerable pain.  Tried and failed all other manner of conservative care.  We will try epidural injection 1 time today to the significance of her pain.  If she does get good relief with the epidural would probably have her follow-up with Dr.  Rodell Perna for consideration of decompression.    Meds & Orders:  Meds ordered this encounter  Medications  . DISCONTD: betamethasone acetate-betamethasone sodium phosphate (CELESTONE) injection 12 mg    Orders Placed This Encounter  Procedures  . XR C-ARM NO REPORT  . Epidural Steroid injection    Follow-up: Return if  symptoms worsen or fail to improve.   Procedures: No procedures performed  Lumbar Epidural Steroid Injection - Interlaminar Approach with Fluoroscopic Guidance  Patient: Martha Gilmore      Date of Birth: 1940/10/04 MRN: [id number] PCP: Leamon Arnt, MD      Visit Date: 04/14/2020   Universal Protocol:     Consent Given By: the patient  Position: PRONE  Additional Comments: Vital signs were monitored before and after the procedure. Patient was prepped and draped in the usual sterile fashion. The correct patient, procedure, and site was verified.   Injection Procedure Details:   Procedure diagnoses: Spinal stenosis of lumbar region with neurogenic claudication [M48.062]   Meds Administered:  Meds ordered this encounter  Medications  . DISCONTD: betamethasone acetate-betamethasone sodium phosphate (CELESTONE) injection 12 mg     Laterality: Right  Location/Site:  L4-L5  Needle: 3.5 in., 20 ga. Tuohy  Needle Placement: Paramedian epidural  Findings:   -Comments: Excellent flow of contrast into the epidural space.  Procedure Details: Using a paramedian approach from the side mentioned above, the region overlying the inferior lamina was localized under fluoroscopic visualization and the soft tissues overlying this structure were infiltrated with 4 ml. of 1% Lidocaine without Epinephrine. The Tuohy needle was inserted into the epidural space using a paramedian approach.   The epidural space was localized using loss of resistance along with counter oblique bi-planar fluoroscopic views.  After negative aspirate for air, blood, and CSF, a 2 ml. volume of Isovue-250 was injected into the epidural space and the flow of contrast was observed. Radiographs were obtained for documentation purposes.    The injectate was administered into the level noted above.   Additional Comments:  The patient tolerated the procedure well Dressing: 2 x 2 sterile gauze and Band-Aid     Post-procedure details: Patient was observed during the procedure. Post-procedure instructions were reviewed.  Patient left the clinic in stable condition.    Clinical History: CLINICAL DATA:  Low back pain getting worse over the past 8-10 weeks with no known injury.  EXAM: MRI LUMBAR SPINE WITHOUT CONTRAST  TECHNIQUE: Multiplanar, multisequence MR imaging of the lumbar spine was performed. No intravenous contrast was administered.  COMPARISON:  None.  FINDINGS: Segmentation:  Standard.  Alignment: No scoliosis of the lumbar spine. 2 mm retrolisthesis of L2 on L3. Grade 1 anterolisthesis of L4 on L5 secondary to facet disease.  Vertebrae:  No fracture, evidence of discitis, or bone lesion.  Conus medullaris and cauda equina: Conus extends to the L1 level. Conus and cauda equina appear normal.  Paraspinal and other soft tissues: No acute paraspinal abnormality.  Disc levels:  Disc spaces: Degenerative disease with disc height loss throughout the lumbar spine with relative sparing at L4-5. Discogenic changes at L1-2.  T11-12: Mild broad-based disc bulge. Mild bilateral facet arthropathy. Mild right foraminal narrowing. No left foraminal narrowing.  T12-L1: Minimal broad-based disc bulge. No evidence of neural foraminal stenosis. No central canal stenosis.  L1-L2: Broad-based disc bulge. Mild bilateral facet arthropathy. Moderate right and mild left foraminal stenosis. Bilateral subarticular recess narrowing. No spinal stenosis.  L2-L3: Broad-based disc osteophyte complex. Moderate left and  mild right facet arthropathy. Severe left subarticular recess stenosis. Severe left foraminal stenosis. Scratch them moderate left foraminal stenosis. No right foraminal stenosis. No central canal stenosis.  L3-L4: Partial ankylosis along the left side of the L3-4 disc with osseous ridging along the foraminal aspect. Mild bilateral facet arthropathy. Mild  left foraminal stenosis. No right foraminal stenosis. No evidence of neural foraminal stenosis. No central canal stenosis. Left lateral recess stenosis.  L4-L5: Broad-based disc bulge. Severe bilateral facet arthropathy with ligamentum flavum infolding. Moderate spinal stenosis. Bilateral lateral recess stenosis. Mild bilateral foraminal stenosis.  L5-S1: Broad-based disc bulge. Moderate bilateral facet arthropathy. Mild spinal stenosis. Bilateral lateral recess stenosis. Moderate bilateral foraminal stenosis.  IMPRESSION: 1. Diffuse lumbar spine spondylosis as described above. 2. No acute osseous injury of the lumbar spine.   Electronically Signed   By: Kathreen Devoid   On: 10/18/2019 10:43     Objective:  VS:  HT:    WT:   BMI:     BP:(!) 148/70  HR:(!) 58bpm  TEMP: ( )  RESP:  Physical Exam Vitals and nursing note reviewed.  Constitutional:      General: She is not in acute distress.    Appearance: Normal appearance. She is not ill-appearing.  HENT:     Head: Normocephalic and atraumatic.     Right Ear: External ear normal.     Left Ear: External ear normal.  Eyes:     Extraocular Movements: Extraocular movements intact.  Cardiovascular:     Rate and Rhythm: Normal rate.     Pulses: Normal pulses.  Pulmonary:     Effort: Pulmonary effort is normal. No respiratory distress.  Abdominal:     General: There is no distension.     Palpations: Abdomen is soft.  Musculoskeletal:        General: Tenderness present.     Cervical back: Neck supple.     Right lower leg: No edema.     Left lower leg: No edema.     Comments: Patient has good distal strength with no pain over the greater trochanters.  No clonus or focal weakness.  Seems to have less pain with facet loading and extension.  She has a negative slump test.  Skin:    Findings: No erythema, lesion or rash.  Neurological:     General: No focal deficit present.     Mental Status: She is alert and oriented  to person, place, and time.     Sensory: No sensory deficit.     Motor: No weakness or abnormal muscle tone.     Coordination: Coordination normal.  Psychiatric:        Mood and Affect: Mood normal.        Behavior: Behavior normal.      Imaging: No results found.

## 2020-06-14 ENCOUNTER — Other Ambulatory Visit: Payer: Self-pay

## 2020-06-14 ENCOUNTER — Ambulatory Visit (INDEPENDENT_AMBULATORY_CARE_PROVIDER_SITE_OTHER): Payer: Medicare Other | Admitting: Physical Therapy

## 2020-06-14 DIAGNOSIS — M545 Low back pain, unspecified: Secondary | ICD-10-CM

## 2020-06-14 DIAGNOSIS — G8929 Other chronic pain: Secondary | ICD-10-CM

## 2020-06-22 ENCOUNTER — Other Ambulatory Visit: Payer: Self-pay

## 2020-06-22 ENCOUNTER — Ambulatory Visit (INDEPENDENT_AMBULATORY_CARE_PROVIDER_SITE_OTHER): Payer: Medicare Other | Admitting: Physical Therapy

## 2020-06-22 DIAGNOSIS — G8929 Other chronic pain: Secondary | ICD-10-CM | POA: Diagnosis not present

## 2020-06-22 DIAGNOSIS — M545 Low back pain, unspecified: Secondary | ICD-10-CM | POA: Diagnosis not present

## 2020-06-24 ENCOUNTER — Other Ambulatory Visit: Payer: Self-pay

## 2020-06-24 ENCOUNTER — Ambulatory Visit (INDEPENDENT_AMBULATORY_CARE_PROVIDER_SITE_OTHER): Payer: Medicare Other | Admitting: Physical Therapy

## 2020-06-24 DIAGNOSIS — M545 Low back pain, unspecified: Secondary | ICD-10-CM | POA: Diagnosis not present

## 2020-06-24 DIAGNOSIS — G8929 Other chronic pain: Secondary | ICD-10-CM | POA: Diagnosis not present

## 2020-06-26 ENCOUNTER — Encounter: Payer: Self-pay | Admitting: Physical Therapy

## 2020-06-26 NOTE — Therapy (Signed)
[facility] [address] [place], [place], [address] Phone: [phone]   Fax:  [phone]  Physical Therapy Treatment  Patient Details  Name: Martha Gilmore MRN: [id number] Date of Birth: 17-May-1940 Referring Provider (PT): Billey Chang   Encounter Date: 06/22/2020   PT End of Session - 06/26/20 1340    Visit Number 2    Number of Visits 12    Date for PT Re-Evaluation 07/26/20    Authorization Type Medicare    PT Start Time 1518    PT Stop Time 1600    PT Time Calculation (min) 42 min    Activity Tolerance Patient tolerated treatment well    Behavior During Therapy [facility] for tasks assessed/performed           Past Medical History:  Diagnosis Date  . Cataracts, bilateral 09/23/2015  . Clostridium difficile infection 04/16/2017   H/o C.diff due to clindamicyin 09/2016; two rounds of treatment for cure. Dr. Benson Norway  . Depression   . Hyperlipidemia   . Hypertension   . OAB (overactive bladder) 12/16/2013  . Osteopenia 08/05/2012   Dexa scan 07/2012 mild osteopenia, T = -1.1  . Seasonal allergies   . Urge and stress incontinence     Past Surgical History:  Procedure Laterality Date  . APPENDECTOMY  1971  . CESAREAN SECTION    . CHOLECYSTECTOMY    . Teeth Implants    . TRIGGER FINGER RELEASE    . VAGINAL HYSTERECTOMY      There were no vitals filed for this visit.   Subjective Assessment - 06/26/20 1323    Subjective Pt with pain in R glute in AMs. Has been doing HEP    Currently in Pain? Yes    Pain Score 5     Pain Location Back    Pain Orientation Right;Left    Pain Descriptors / Indicators Aching    Pain Type Chronic pain    Pain Onset More than a month ago    Pain Frequency Intermittent                             [facility] Adult PT Treatment/Exercise - 06/26/20 1323      Exercises   Exercises Lumbar      Lumbar Exercises: Stretches   Active Hamstring Stretch 3 reps;30 seconds    Active  Hamstring Stretch Limitations seated    Single Knee to Chest Stretch 3 reps;30 seconds    Lower Trunk Rotation 10 seconds;5 reps    Pelvic Tilt 20 reps      Lumbar Exercises: Aerobic   Recumbent Bike L1 x 8 min;      Lumbar Exercises: Supine   Ab Set 10 reps    Clam 20 reps    Clam Limitations GTB    Bent Knee Raise 20 reps    Straight Leg Raise 10 reps      Lumbar Exercises: Sidelying   Hip Abduction 10 reps;Both      Manual Therapy   Manual Therapy Manual Traction;Soft tissue mobilization    Soft tissue mobilization DTM R glute med, min, piriformis                    PT Short Term Goals - 06/26/20 1134      PT SHORT TERM GOAL #1   Title Pt to be independent with initial HEP    Time 2    Period Weeks  Status New    Target Date 06/28/20             PT Long Term Goals - 06/26/20 1134      PT LONG TERM GOAL #1   Title Pt to be independent with final HEP    Time 6    Period Weeks    Status New    Target Date 07/26/20      PT LONG TERM GOAL #2   Title Pt to report decreased pain in R lumbar region to 0-2/10 with IADLS.    Time 6    Period Weeks    Status New    Target Date 07/26/20      PT LONG TERM GOAL #3   Title Pt to demo optimal bend, squat, lift mechanics for improved back pain and ability for IADLS.    Time 6    Period Weeks    Status New    Target Date 07/26/20                 Plan - 06/26/20 1338    Clinical Impression Statement Pt with tenderness in R glute, addressed with manual therapy/ DTM for relieving. THer ex progressed for lumbar stretching and strengthening. Plan to progress strength and mobility as tolerated.    Examination-Activity Limitations Bend;Transfers;Squat;Stand;Lift;Locomotion Level    Examination-Participation Restrictions Cleaning;Yard Work;Community Activity;Shop    Stability/Clinical Decision Making Stable/Uncomplicated    Rehab Potential Good    PT Frequency 2x / week    PT Duration 6 weeks    PT  Treatment/Interventions ADLs/Self Care Home Management;Cryotherapy;Electrical Stimulation;DME Instruction;Ultrasound;Traction;Moist Heat;Iontophoresis 4mg /ml Dexamethasone;Gait training;Stair training;Functional mobility training;Therapeutic activities;Therapeutic exercise;Balance training;Patient/family education;Neuromuscular re-education;Manual techniques;Passive range of motion;Taping;Dry needling;Spinal Manipulations;Joint Manipulations    PT Home Exercise Plan Access Code [email or web address]    Consulted and Agree with Plan of Care Patient           Patient will benefit from skilled therapeutic intervention in order to improve the following deficits and impairments:  Pain,Improper body mechanics,Increased muscle spasms,Decreased mobility,Decreased activity tolerance,Decreased endurance,Decreased range of motion,Decreased strength,Impaired flexibility,Difficulty walking,Decreased balance  Visit Diagnosis: Chronic bilateral low back pain without sciatica     Problem List Patient Active Problem List   Diagnosis Date Noted  . Facet degeneration of lumbar region 01/06/2020  . Chondromalacia, knee, right 11/05/2017  . Osteoarthrosis, localized, primary, knee, right 11/05/2017  . Major depression, chronic 04/16/2017  . Mixed hyperlipidemia 04/16/2017  . Clostridium difficile infection 04/16/2017  . Family history of early CAD 04/16/2017  . Bilateral sensorineural hearing loss 02/06/2016  . Postablative hypothyroidism 12/20/2015  . History of Graves' disease 03/22/2015  . OAB (overactive bladder) 12/16/2013  . GERD (gastroesophageal reflux disease) 12/13/2012  . Osteoarthritis of [place] joint of thumb 08/13/2012  . Seasonal allergies 08/13/2012  . Urge and stress incontinence 08/13/2012  . Osteopenia 08/05/2012  . DDD (degenerative disc disease), lumbosacral 10/22/2010   Lyndee Hensen, PT, DPT 1:42 PM  06/26/20    [facility] [place], [place], [address] Phone: [phone]   Fax:  [phone]  Name: Martha Gilmore MRN: [id number] Date of Birth: [birth year]

## 2020-06-26 NOTE — Therapy (Signed)
[facility] [address] [place], [place], [address] Phone: [phone]   Fax:  [phone]  Physical Therapy Evaluation  Patient Details  Name: Martha Gilmore MRN: [id number] Date of Birth: 1940-07-05 Referring Provider (PT): Billey Chang   Encounter Date: 06/14/2020   PT End of Session - 06/26/20 1132    Visit Number 1    Number of Visits 12    Date for PT Re-Evaluation 07/26/20    Authorization Type Medicare    PT Start Time 5361    PT Stop Time 1425    PT Time Calculation (min) 40 min    Activity Tolerance Patient tolerated treatment well    Behavior During Therapy [facility] for tasks assessed/performed           Past Medical History:  Diagnosis Date  . Cataracts, bilateral 09/23/2015  . Clostridium difficile infection 04/16/2017   H/o C.diff due to clindamicyin 09/2016; two rounds of treatment for cure. Dr. Benson Norway  . Depression   . Hyperlipidemia   . Hypertension   . OAB (overactive bladder) 12/16/2013  . Osteopenia 08/05/2012   Dexa scan 07/2012 mild osteopenia, T = -1.1  . Seasonal allergies   . Urge and stress incontinence     Past Surgical History:  Procedure Laterality Date  . APPENDECTOMY  1971  . CESAREAN SECTION    . CHOLECYSTECTOMY    . Teeth Implants    . TRIGGER FINGER RELEASE    . VAGINAL HYSTERECTOMY      There were no vitals filed for this visit.    Subjective Assessment - 06/26/20 1130    Subjective Pt states ongoing pain in low back. R >L. R side hurts in AM, better with movmenet after a few minutes. Most increased pain after standing, walking, or cleaning fo rmore than 30 min. Pt has also seen Chiropractor, getting adjusted, they recommended PT. Has had injections and ablasions in lumbar spine in 2021.    Limitations Standing;Walking;House hold activities    Patient Stated Goals Decreased pain    Currently in Pain? Yes    Pain Score 5     Pain Location Back    Pain Orientation Right;Left    Pain  Descriptors / Indicators Aching    Pain Type Chronic pain    Pain Radiating Towards Standing longer than 1 hour    Pain Onset More than a month ago    Pain Frequency Intermittent    Aggravating Factors  prolonged standing, walking, on feet, cleaning    Pain Relieving Factors heat, sitting, rest              [facility] PT Assessment - 06/26/20 0001      Assessment   Medical Diagnosis Low Back Pain    Referring Provider (PT) Billey Chang    Prior Therapy no      Balance Screen   Has the patient fallen in the past 6 months No      Prior Function   Level of Independence Independent      Cognition   Overall Cognitive Status Within Functional Limits for tasks assessed      ROM / Strength   AROM / PROM / Strength AROM;Strength      AROM   Overall AROM Comments Lumbar: mild limitation for flex, Mod/sig limitation for R SB and Ext. Pain with R SB      Strength   Overall Strength Comments Hips: 4-/5, Knees: 5/5,      Palpation  Palpation comment Pain in R low lumbar, SI, into R glute      Special Tests   Other special tests Neg SLR, poor squat mechanics                      Objective measurements completed on examination: See above findings.       [facility] Adult PT Treatment/Exercise - 06/26/20 0001      Lumbar Exercises: Stretches   Single Knee to Chest Stretch 3 reps;30 seconds    Lower Trunk Rotation 10 seconds;5 reps    Pelvic Tilt 10 reps      Lumbar Exercises: Supine   Ab Set 10 reps    Bent Knee Raise 20 reps                  PT Education - 06/26/20 1132    Education Details PT POC, Exam findings, HEP    Person(s) Educated Patient    Methods Explanation;Demonstration;Tactile cues;Verbal cues;Handout    Comprehension Verbalized understanding;Returned demonstration;Verbal cues required;Tactile cues required;Need further instruction            PT Short Term Goals - 06/26/20 1134      PT SHORT TERM GOAL #1   Title Pt to be independent  with initial HEP    Time 2    Period Weeks    Status New    Target Date 06/28/20             PT Long Term Goals - 06/26/20 1134      PT LONG TERM GOAL #1   Title Pt to be independent with final HEP    Time 6    Period Weeks    Status New    Target Date 07/26/20      PT LONG TERM GOAL #2   Title Pt to report decreased pain in R lumbar region to 0-2/10 with IADLS.    Time 6    Period Weeks    Status New    Target Date 07/26/20      PT LONG TERM GOAL #3   Title Pt to demo optimal bend, squat, lift mechanics for improved back pain and ability for IADLS.    Time 6    Period Weeks    Status New    Target Date 07/26/20                  Plan - 06/26/20 1138    Clinical Impression Statement Pt presents wtih primary complaint of increased pain in low back. Pt with decreased tolerance for standing and walking, due to pain. Pt with decreased strength in hips and core and ineffective HEP for her dx. Pt with poor mechanics for bending, lifting and IADLS. Pt with decreased ability for full functional activities, due to pain. Pt to benefit from skilled PT to improve deficits and pain.    Examination-Activity Limitations Bend;Transfers;Squat;Stand;Lift;Locomotion Level    Examination-Participation Restrictions Cleaning;Yard Work;Community Activity;Shop    Stability/Clinical Decision Making Stable/Uncomplicated    Clinical Decision Making Low    Rehab Potential Good    PT Frequency 2x / week    PT Duration 6 weeks    PT Treatment/Interventions ADLs/Self Care Home Management;Cryotherapy;Electrical Stimulation;DME Instruction;Ultrasound;Traction;Moist Heat;Iontophoresis 4mg /ml Dexamethasone;Gait training;Stair training;Functional mobility training;Therapeutic activities;Therapeutic exercise;Balance training;Patient/family education;Neuromuscular re-education;Manual techniques;Passive range of motion;Taping;Dry needling;Spinal Manipulations;Joint Manipulations    PT Home Exercise  Plan Access Code [email or web address]    Consulted and Agree with Plan of Care Patient  Patient will benefit from skilled therapeutic intervention in order to improve the following deficits and impairments:  Pain,Improper body mechanics,Increased muscle spasms,Decreased mobility,Decreased activity tolerance,Decreased endurance,Decreased range of motion,Decreased strength,Impaired flexibility,Difficulty walking,Decreased balance  Visit Diagnosis: Chronic bilateral low back pain without sciatica     Problem List Patient Active Problem List   Diagnosis Date Noted  . Facet degeneration of lumbar region 01/06/2020  . Chondromalacia, knee, right 11/05/2017  . Osteoarthrosis, localized, primary, knee, right 11/05/2017  . Major depression, chronic 04/16/2017  . Mixed hyperlipidemia 04/16/2017  . Clostridium difficile infection 04/16/2017  . Family history of early CAD 04/16/2017  . Bilateral sensorineural hearing loss 02/06/2016  . Postablative hypothyroidism 12/20/2015  . History of Graves' disease 03/22/2015  . OAB (overactive bladder) 12/16/2013  . GERD (gastroesophageal reflux disease) 12/13/2012  . Osteoarthritis of [place] joint of thumb 08/13/2012  . Seasonal allergies 08/13/2012  . Urge and stress incontinence 08/13/2012  . Osteopenia 08/05/2012  . DDD (degenerative disc disease), lumbosacral 10/22/2010   Lyndee Hensen, PT, DPT 12:05 PM  06/26/20    [facility] [place], [place], [address] Phone: [phone]   Fax:  [phone]  Name: Martha Gilmore MRN: [id number] Date of Birth: 1940/04/26

## 2020-06-26 NOTE — Therapy (Signed)
[facility] [address] [place], [place], [address] Phone: [phone]   Fax:  [phone]  Physical Therapy Treatment  Patient Details  Name: Martha Gilmore MRN: [id number] Date of Birth: February 20, 1941 Referring Provider (PT): Billey Chang   Encounter Date: 06/24/2020   PT End of Session - 06/26/20 1346    Visit Number 3    Number of Visits 12    Date for PT Re-Evaluation 07/26/20    Authorization Type Medicare    PT Start Time 0848    PT Stop Time 0928    PT Time Calculation (min) 40 min    Activity Tolerance Patient tolerated treatment well    Behavior During Therapy [facility] for tasks assessed/performed           Past Medical History:  Diagnosis Date  . Cataracts, bilateral 09/23/2015  . Clostridium difficile infection 04/16/2017   H/o C.diff due to clindamicyin 09/2016; two rounds of treatment for cure. Dr. Benson Norway  . Depression   . Hyperlipidemia   . Hypertension   . OAB (overactive bladder) 12/16/2013  . Osteopenia 08/05/2012   Dexa scan 07/2012 mild osteopenia, T = -1.1  . Seasonal allergies   . Urge and stress incontinence     Past Surgical History:  Procedure Laterality Date  . APPENDECTOMY  1971  . CESAREAN SECTION    . CHOLECYSTECTOMY    . Teeth Implants    . TRIGGER FINGER RELEASE    . VAGINAL HYSTERECTOMY      There were no vitals filed for this visit.   Subjective Assessment - 06/26/20 1345    Subjective Pt states mild sorenss after DTM last session. minimal back pain today.States some days without back pain, depending on what shes doing.  Thinks pain in R this am was a bit better.    Currently in Pain? Yes    Pain Score 2     Pain Location Back    Pain Orientation Right;Left    Pain Descriptors / Indicators Aching    Pain Type Chronic pain    Pain Onset More than a month ago    Pain Frequency Intermittent                             [facility] Adult PT Treatment/Exercise - 06/26/20 1344       Exercises   Exercises Lumbar      Lumbar Exercises: Stretches   Active Hamstring Stretch 3 reps;30 seconds    Active Hamstring Stretch Limitations seated    Single Knee to Chest Stretch 3 reps;30 seconds    Pelvic Tilt 20 reps    Other Lumbar Stretch Exercise Standin QL stretch      Lumbar Exercises: Aerobic   Recumbent Bike L1 x 6 min;      Lumbar Exercises: Standing   Functional Squats 10 reps    Functional Squats Limitations with educatoin on form    Lifting From 12";5 reps    Lifting Limitations with education on form      Lumbar Exercises: Supine   Clam 20 reps    Clam Limitations GTB    Bent Knee Raise 20 reps    Straight Leg Raise 10 reps      Lumbar Exercises: Sidelying   Hip Abduction Both;15 reps      Manual Therapy   Manual Therapy Manual Traction;Passive ROM;Soft tissue mobilization;Joint mobilization    Joint Mobilization lumbar PA mobs  Manual Traction Long leg distraction on R x 2 min for lumbar pump                    PT Short Term Goals - 06/26/20 1134      PT SHORT TERM GOAL #1   Title Pt to be independent with initial HEP    Time 2    Period Weeks    Status New    Target Date 06/28/20             PT Long Term Goals - 06/26/20 1134      PT LONG TERM GOAL #1   Title Pt to be independent with final HEP    Time 6    Period Weeks    Status New    Target Date 07/26/20      PT LONG TERM GOAL #2   Title Pt to report decreased pain in R lumbar region to 0-2/10 with IADLS.    Time 6    Period Weeks    Status New    Target Date 07/26/20      PT LONG TERM GOAL #3   Title Pt to demo optimal bend, squat, lift mechanics for improved back pain and ability for IADLS.    Time 6    Period Weeks    Status New    Target Date 07/26/20                 Plan - 06/26/20 1348    Clinical Impression Statement Pt with slight improvment of soreness in R glute. Education on form for squat, and lifting today,for vaccuming and  IADLS. Pt with improved form after education and practice. Plan to progress strengthening as tolerated.    Examination-Activity Limitations Bend;Transfers;Squat;Stand;Lift;Locomotion Level    Examination-Participation Restrictions Cleaning;Yard Work;Community Activity;Shop    Stability/Clinical Decision Making Stable/Uncomplicated    Rehab Potential Good    PT Frequency 2x / week    PT Duration 6 weeks    PT Treatment/Interventions ADLs/Self Care Home Management;Cryotherapy;Electrical Stimulation;DME Instruction;Ultrasound;Traction;Moist Heat;Iontophoresis 4mg /ml Dexamethasone;Gait training;Stair training;Functional mobility training;Therapeutic activities;Therapeutic exercise;Balance training;Patient/family education;Neuromuscular re-education;Manual techniques;Passive range of motion;Taping;Dry needling;Spinal Manipulations;Joint Manipulations    PT Home Exercise Plan Access Code [email or web address]    Consulted and Agree with Plan of Care Patient           Patient will benefit from skilled therapeutic intervention in order to improve the following deficits and impairments:  Pain,Improper body mechanics,Increased muscle spasms,Decreased mobility,Decreased activity tolerance,Decreased endurance,Decreased range of motion,Decreased strength,Impaired flexibility,Difficulty walking,Decreased balance  Visit Diagnosis: Chronic bilateral low back pain without sciatica     Problem List Patient Active Problem List   Diagnosis Date Noted  . Facet degeneration of lumbar region 01/06/2020  . Chondromalacia, knee, right 11/05/2017  . Osteoarthrosis, localized, primary, knee, right 11/05/2017  . Major depression, chronic 04/16/2017  . Mixed hyperlipidemia 04/16/2017  . Clostridium difficile infection 04/16/2017  . Family history of early CAD 04/16/2017  . Bilateral sensorineural hearing loss 02/06/2016  . Postablative hypothyroidism 12/20/2015  . History of Graves' disease 03/22/2015  . OAB (overactive  bladder) 12/16/2013  . GERD (gastroesophageal reflux disease) 12/13/2012  . Osteoarthritis of [place] joint of thumb 08/13/2012  . Seasonal allergies 08/13/2012  . Urge and stress incontinence 08/13/2012  . Osteopenia 08/05/2012  . DDD (degenerative disc disease), lumbosacral 10/22/2010    Lyndee Hensen, PT, DPT 1:49 PM  06/26/20    [facility] [place], [place], [address]  Phone: [phone]   Fax:  [phone]  Name: Kensey San Gilmore MRN: [id number] Date of Birth: 1940/04/30

## 2020-06-26 NOTE — Patient Instructions (Signed)
Access Code: [email or web address] URL: [email or web address] Date: 06/26/2020 Prepared by: Lyndee Hensen  Exercises Supine Posterior Pelvic Tilt - 2 x daily - 1-2 sets - 10 reps Supine Single Knee to Chest Stretch - 2 x daily - 3 reps - 30 hold Supine Lower Trunk Rotation - 2 x daily - 10 reps - 5 hold Supine Transversus Abdominis Bracing - Hands on Stomach - 2 x daily - 1 sets - 10 reps - 5 hold Supine March - 2 x daily - 1-2 sets - 10 reps

## 2020-06-28 ENCOUNTER — Encounter: Payer: Self-pay | Admitting: Physical Therapy

## 2020-06-28 ENCOUNTER — Other Ambulatory Visit: Payer: Self-pay

## 2020-06-28 ENCOUNTER — Ambulatory Visit (INDEPENDENT_AMBULATORY_CARE_PROVIDER_SITE_OTHER): Payer: Medicare Other | Admitting: Physical Therapy

## 2020-06-28 DIAGNOSIS — G8929 Other chronic pain: Secondary | ICD-10-CM | POA: Diagnosis not present

## 2020-06-28 DIAGNOSIS — M545 Low back pain, unspecified: Secondary | ICD-10-CM

## 2020-06-28 NOTE — Therapy (Signed)
[facility] [address] [place], [place], [address] Phone: [phone]   Fax:  [phone]  Physical Therapy Treatment  Patient Details  Name: Martha Gilmore MRN: [id number] Date of Birth: 1940-11-18 Referring Provider (PT): Billey Chang   Encounter Date: 06/28/2020   PT End of Session - 06/28/20 0908    Visit Number 4    Number of Visits 12    Date for PT Re-Evaluation 07/26/20    Authorization Type Medicare    PT Start Time 0845    PT Stop Time 0932    PT Time Calculation (min) 47 min    Activity Tolerance Patient tolerated treatment well    Behavior During Therapy [facility] for tasks assessed/performed           Past Medical History:  Diagnosis Date  . Cataracts, bilateral 09/23/2015  . Clostridium difficile infection 04/16/2017   H/o C.diff due to clindamicyin 09/2016; two rounds of treatment for cure. Dr. Benson Norway  . Depression   . Hyperlipidemia   . Hypertension   . OAB (overactive bladder) 12/16/2013  . Osteopenia 08/05/2012   Dexa scan 07/2012 mild osteopenia, T = -1.1  . Seasonal allergies   . Urge and stress incontinence     Past Surgical History:  Procedure Laterality Date  . APPENDECTOMY  1971  . CESAREAN SECTION    . CHOLECYSTECTOMY    . Teeth Implants    . TRIGGER FINGER RELEASE    . VAGINAL HYSTERECTOMY      There were no vitals filed for this visit.   Subjective Assessment - 06/28/20 0907    Subjective Pt states soreness in neck today, as well as soreness in R low back.    Currently in Pain? Yes    Pain Score 6     Pain Location Back    Pain Orientation Right;Left    Pain Descriptors / Indicators Aching    Pain Type Chronic pain    Pain Onset More than a month ago    Pain Frequency Intermittent                             [facility] Adult PT Treatment/Exercise - 06/28/20 0001      Exercises   Exercises Lumbar      Lumbar Exercises: Stretches   Active Hamstring Stretch --     Active Hamstring Stretch Limitations --    Single Knee to Chest Stretch 3 reps;30 seconds    Pelvic Tilt 20 reps    Other Lumbar Stretch Exercise Standing QL stretch on R.  (pain on R with stretching L)      Lumbar Exercises: Aerobic   Recumbent Bike L1 x 6 min;      Lumbar Exercises: Standing   Functional Squats 10 reps      Lumbar Exercises: Supine   Clam 20 reps    Clam Limitations GTB    Bent Knee Raise 20 reps    Bent Knee Raise Limitations with TA    Straight Leg Raise 10 reps    Straight Leg Raises Limitations with TA      Lumbar Exercises: Sidelying   Hip Abduction --      Manual Therapy   Manual Therapy Manual Traction;Passive ROM;Soft tissue mobilization;Joint mobilization    Manual therapy comments skilled palpation and moitoring of soft tissue with dry needling.    Joint Mobilization lumbar PA mobs    Soft tissue mobilization DTM R  glute med, min, piriformis    Manual Traction Long leg distraction on R x 2 min for lumbar pump            Trigger Point Dry Needling - 06/28/20 0001    Consent Given? Yes    Education Handout Provided Yes    Muscles Treated Back/Hip Gluteus minimus;Gluteus medius    Gluteus Minimus Response Palpable increased muscle length   R   Gluteus Medius Response Palpable increased muscle length   R                 PT Short Term Goals - 06/26/20 1134      PT SHORT TERM GOAL #1   Title Pt to be independent with initial HEP    Time 2    Period Weeks    Status New    Target Date 06/28/20             PT Long Term Goals - 06/26/20 1134      PT LONG TERM GOAL #1   Title Pt to be independent with final HEP    Time 6    Period Weeks    Status New    Target Date 07/26/20      PT LONG TERM GOAL #2   Title Pt to report decreased pain in R lumbar region to 0-2/10 with IADLS.    Time 6    Period Weeks    Status New    Target Date 07/26/20      PT LONG TERM GOAL #3   Title Pt to demo optimal bend, squat, lift mechanics  for improved back pain and ability for IADLS.    Time 6    Period Weeks    Status New    Target Date 07/26/20                 Plan - 06/28/20 0949    Clinical Impression Statement Pt with soreness and tenderness in R glute today. Addressed with manual and dry needling. Continues to have variable pain, but most pain with standing and R SB motion. Reviewed HEP. Plan to progress strengthening and continue manual for pain as tolerated.    Examination-Activity Limitations Bend;Transfers;Squat;Stand;Lift;Locomotion Level    Examination-Participation Restrictions Cleaning;Yard Work;Community Activity;Shop    Stability/Clinical Decision Making Stable/Uncomplicated    Rehab Potential Good    PT Frequency 2x / week    PT Duration 6 weeks    PT Treatment/Interventions ADLs/Self Care Home Management;Cryotherapy;Electrical Stimulation;DME Instruction;Ultrasound;Traction;Moist Heat;Iontophoresis 4mg /ml Dexamethasone;Gait training;Stair training;Functional mobility training;Therapeutic activities;Therapeutic exercise;Balance training;Patient/family education;Neuromuscular re-education;Manual techniques;Passive range of motion;Taping;Dry needling;Spinal Manipulations;Joint Manipulations    PT Home Exercise Plan Access Code [email or web address]    Consulted and Agree with Plan of Care Patient           Patient will benefit from skilled therapeutic intervention in order to improve the following deficits and impairments:  Pain,Improper body mechanics,Increased muscle spasms,Decreased mobility,Decreased activity tolerance,Decreased endurance,Decreased range of motion,Decreased strength,Impaired flexibility,Difficulty walking,Decreased balance  Visit Diagnosis: Chronic bilateral low back pain without sciatica     Problem List Patient Active Problem List   Diagnosis Date Noted  . Facet degeneration of lumbar region 01/06/2020  . Chondromalacia, knee, right 11/05/2017  . Osteoarthrosis, localized, primary,  knee, right 11/05/2017  . Major depression, chronic 04/16/2017  . Mixed hyperlipidemia 04/16/2017  . Clostridium difficile infection 04/16/2017  . Family history of early CAD 04/16/2017  . Bilateral sensorineural hearing loss 02/06/2016  . Postablative hypothyroidism 12/20/2015  . History  of Graves' disease 03/22/2015  . OAB (overactive bladder) 12/16/2013  . GERD (gastroesophageal reflux disease) 12/13/2012  . Osteoarthritis of [place] joint of thumb 08/13/2012  . Seasonal allergies 08/13/2012  . Urge and stress incontinence 08/13/2012  . Osteopenia 08/05/2012  . DDD (degenerative disc disease), lumbosacral 10/22/2010    Lyndee Hensen, PT, DPT 9:51 AM  06/28/20      [facility] [place], [place], [address] Phone: [phone]   Fax:  [phone]  Name: Atisha San Gilmore MRN: [id number] Date of Birth: 1940-06-08

## 2020-06-30 ENCOUNTER — Ambulatory Visit (INDEPENDENT_AMBULATORY_CARE_PROVIDER_SITE_OTHER): Payer: Medicare Other | Admitting: Physical Therapy

## 2020-06-30 ENCOUNTER — Encounter: Payer: Self-pay | Admitting: Physical Therapy

## 2020-06-30 DIAGNOSIS — M545 Low back pain, unspecified: Secondary | ICD-10-CM | POA: Diagnosis not present

## 2020-06-30 DIAGNOSIS — G8929 Other chronic pain: Secondary | ICD-10-CM | POA: Diagnosis not present

## 2020-06-30 NOTE — Therapy (Signed)
[facility] [address] [place], [place], [address] Phone: [phone]   Fax:  [phone]  Physical Therapy Treatment  Patient Details  Name: Martha Gilmore MRN: [id number] Date of Birth: [birth year] Referring Provider (PT): Billey Chang   Encounter Date: 06/30/2020   PT End of Session - 06/30/20 0859    Visit Number 5    Number of Visits 12    Date for PT Re-Evaluation 07/26/20    Authorization Type Medicare    PT Start Time 0848    PT Stop Time 0932    PT Time Calculation (min) 44 min    Activity Tolerance Patient tolerated treatment well    Behavior During Therapy [facility] for tasks assessed/performed           Past Medical History:  Diagnosis Date  . Cataracts, bilateral 09/23/2015  . Clostridium difficile infection 04/16/2017   H/o C.diff due to clindamicyin 09/2016; two rounds of treatment for cure. Dr. Benson Norway  . Depression   . Hyperlipidemia   . Hypertension   . OAB (overactive bladder) 12/16/2013  . Osteopenia 08/05/2012   Dexa scan 07/2012 mild osteopenia, T = -1.1  . Seasonal allergies   . Urge and stress incontinence     Past Surgical History:  Procedure Laterality Date  . APPENDECTOMY  1971  . CESAREAN SECTION    . CHOLECYSTECTOMY    . Teeth Implants    . TRIGGER FINGER RELEASE    . VAGINAL HYSTERECTOMY      There were no vitals filed for this visit.   Subjective Assessment - 06/30/20 0857    Subjective Pt states much improved pain in R glute yesterday and this am, thinks dry needling helped.    Limitations Standing;Walking;House hold activities    Patient Stated Goals Decreased pain    Currently in Pain? No/denies                             [facility] Adult PT Treatment/Exercise - 06/30/20 0001      Exercises   Exercises Lumbar      Lumbar Exercises: Stretches   Single Knee to Chest Stretch 3 reps;30 seconds    Pelvic Tilt 20 reps    Standing Side Bend 10 seconds    Other Lumbar  Stretch Exercise Standing QL stretch on R.    Other Lumbar Stretch Exercise L side glides x 10      Lumbar Exercises: Aerobic   Recumbent Bike L2 x 4 min; L 1 x 4 min;      Lumbar Exercises: Standing   Functional Squats --    Row 20 reps    Theraband Level (Row) Level 3 (Green)      Lumbar Exercises: Supine   Clam 20 reps    Clam Limitations GTB    Bent Knee Raise --    Bent Knee Raise Limitations --    Straight Leg Raise --    Straight Leg Raises Limitations --    Other Supine Lumbar Exercises OP heel press 5 s x 10 bil;      Lumbar Exercises: Sidelying   Hip Abduction 20 reps      Manual Therapy   Manual Therapy Manual Traction;Passive ROM;Soft tissue mobilization;Joint mobilization    Joint Mobilization --    Soft tissue mobilization --    Passive ROM manual stretching for HS, hip flexion and ER on R;    Manual Traction Long leg distraction  on R x 2 min for lumbar pump                    PT Short Term Goals - 06/26/20 1134      PT SHORT TERM GOAL #1   Title Pt to be independent with initial HEP    Time 2    Period Weeks    Status New    Target Date 06/28/20             PT Long Term Goals - 06/26/20 1134      PT LONG TERM GOAL #1   Title Pt to be independent with final HEP    Time 6    Period Weeks    Status New    Target Date 07/26/20      PT LONG TERM GOAL #2   Title Pt to report decreased pain in R lumbar region to 0-2/10 with IADLS.    Time 6    Period Weeks    Status New    Target Date 07/26/20      PT LONG TERM GOAL #3   Title Pt to demo optimal bend, squat, lift mechanics for improved back pain and ability for IADLS.    Time 6    Period Weeks    Status New    Target Date 07/26/20                 Plan - 06/30/20 0953    Clinical Impression Statement Pt wtih less tenderness in R glute today. Reviewed and updated HEP. Pt continues to have stiffness with lumbar flexion, as well as pain with R SB, some improvment of this  today after side glides and QL stretching. Likely stemming from scoliosis and increased compression on R side. Pt to benefit from continued work on stiffness, and strengthening. Pt also with pain in L side of neck. Will call MD for appt if it does not resolve.    Examination-Activity Limitations Bend;Transfers;Squat;Stand;Lift;Locomotion Level    Examination-Participation Restrictions Cleaning;Yard Work;Community Activity;Shop    Stability/Clinical Decision Making Stable/Uncomplicated    Rehab Potential Good    PT Frequency 2x / week    PT Duration 6 weeks    PT Treatment/Interventions ADLs/Self Care Home Management;Cryotherapy;Electrical Stimulation;DME Instruction;Ultrasound;Traction;Moist Heat;Iontophoresis 4mg /ml Dexamethasone;Gait training;Stair training;Functional mobility training;Therapeutic activities;Therapeutic exercise;Balance training;Patient/family education;Neuromuscular re-education;Manual techniques;Passive range of motion;Taping;Dry needling;Spinal Manipulations;Joint Manipulations    PT Home Exercise Plan Access Code [email or web address]    Consulted and Agree with Plan of Care Patient           Patient will benefit from skilled therapeutic intervention in order to improve the following deficits and impairments:  Pain,Improper body mechanics,Increased muscle spasms,Decreased mobility,Decreased activity tolerance,Decreased endurance,Decreased range of motion,Decreased strength,Impaired flexibility,Difficulty walking,Decreased balance  Visit Diagnosis: Chronic bilateral low back pain without sciatica     Problem List Patient Active Problem List   Diagnosis Date Noted  . Facet degeneration of lumbar region 01/06/2020  . Chondromalacia, knee, right 11/05/2017  . Osteoarthrosis, localized, primary, knee, right 11/05/2017  . Major depression, chronic 04/16/2017  . Mixed hyperlipidemia 04/16/2017  . Clostridium difficile infection 04/16/2017  . Family history of early CAD 04/16/2017   . Bilateral sensorineural hearing loss 02/06/2016  . Postablative hypothyroidism 12/20/2015  . History of Graves' disease 03/22/2015  . OAB (overactive bladder) 12/16/2013  . GERD (gastroesophageal reflux disease) 12/13/2012  . Osteoarthritis of [place] joint of thumb 08/13/2012  . Seasonal allergies 08/13/2012  . Urge and stress incontinence 08/13/2012  .  Osteopenia 08/05/2012  . DDD (degenerative disc disease), lumbosacral 10/22/2010    Lyndee Hensen, PT, DPT 9:56 AM  06/30/20    [facility] [place], [place], [address] Phone: [phone]   Fax:  [phone]  Name: Martha Gilmore MRN: [id number] Date of Birth: 12-30-1940

## 2020-07-05 ENCOUNTER — Other Ambulatory Visit: Payer: Self-pay

## 2020-07-05 ENCOUNTER — Encounter: Payer: Self-pay | Admitting: Physical Therapy

## 2020-07-05 ENCOUNTER — Ambulatory Visit (INDEPENDENT_AMBULATORY_CARE_PROVIDER_SITE_OTHER): Payer: Medicare Other | Admitting: Physical Therapy

## 2020-07-05 DIAGNOSIS — G8929 Other chronic pain: Secondary | ICD-10-CM | POA: Diagnosis not present

## 2020-07-05 DIAGNOSIS — M545 Low back pain, unspecified: Secondary | ICD-10-CM

## 2020-07-05 NOTE — Therapy (Signed)
[facility] [address] [place], [place], [address] Phone: [phone]   Fax:  [phone]  Physical Therapy Treatment  Patient Details  Name: Martha Gilmore MRN: [id number] Date of Birth: 1940-11-13 Referring Provider (PT): Billey Chang   Encounter Date: 07/05/2020   PT End of Session - 07/05/20 1253    Visit Number 6    Number of Visits 12    Date for PT Re-Evaluation 07/26/20    Authorization Type Medicare    PT Start Time 0806    PT Stop Time 0848    PT Time Calculation (min) 42 min    Activity Tolerance Patient tolerated treatment well    Behavior During Therapy [facility] for tasks assessed/performed           Past Medical History:  Diagnosis Date  . Cataracts, bilateral 09/23/2015  . Clostridium difficile infection 04/16/2017   H/o C.diff due to clindamicyin 09/2016; two rounds of treatment for cure. Dr. Benson Norway  . Depression   . Hyperlipidemia   . Hypertension   . OAB (overactive bladder) 12/16/2013  . Osteopenia 08/05/2012   Dexa scan 07/2012 mild osteopenia, T = -1.1  . Seasonal allergies   . Urge and stress incontinence     Past Surgical History:  Procedure Laterality Date  . APPENDECTOMY  1971  . CESAREAN SECTION    . CHOLECYSTECTOMY    . Teeth Implants    . TRIGGER FINGER RELEASE    . VAGINAL HYSTERECTOMY      There were no vitals filed for this visit.   Subjective Assessment - 07/05/20 1252    Subjective Pt states soreness in R glute region still in AMs. She notes improving pain in back, and thinks she can stand for longer time. Was able to stand for 3-4 hours over the weekend, outside at park.    Currently in Pain? Yes    Pain Score 3     Pain Location Back    Pain Orientation Right;Lower    Pain Descriptors / Indicators Spasm;Sore    Pain Type Chronic pain    Pain Onset More than a month ago    Pain Frequency Intermittent                             [facility] Adult PT  Treatment/Exercise - 07/05/20 0001      Lumbar Exercises: Stretches   Active Hamstring Stretch 3 reps;30 seconds    Active Hamstring Stretch Limitations seated    Pelvic Tilt 20 reps    Other Lumbar Stretch Exercise Standing QL stretch on R.      Lumbar Exercises: Aerobic   Recumbent Bike L1 x 8 min;      Lumbar Exercises: Supine   Bent Knee Raise 20 reps    Bent Knee Raise Limitations RTB    Other Supine Lumbar Exercises OP heel press 5 s x 10 bil;      Lumbar Exercises: Sidelying   Hip Abduction 20 reps      Manual Therapy   Joint Mobilization Lumbar PA mobs    Soft tissue mobilization STm/DTM to bil lumbar paraspinals and R QL, into R glute    Manual Traction Long leg distraction on R x 2 min for lumbar pump                    PT Short Term Goals - 06/26/20 1134      PT SHORT  TERM GOAL #1   Title Pt to be independent with initial HEP    Time 2    Period Weeks    Status New    Target Date 06/28/20             PT Long Term Goals - 06/26/20 1134      PT LONG TERM GOAL #1   Title Pt to be independent with final HEP    Time 6    Period Weeks    Status New    Target Date 07/26/20      PT LONG TERM GOAL #2   Title Pt to report decreased pain in R lumbar region to 0-2/10 with IADLS.    Time 6    Period Weeks    Status New    Target Date 07/26/20      PT LONG TERM GOAL #3   Title Pt to demo optimal bend, squat, lift mechanics for improved back pain and ability for IADLS.    Time 6    Period Weeks    Status New    Target Date 07/26/20                 Plan - 07/05/20 1256    Clinical Impression Statement Pt continued to have pain in R glute in AM. Manual done today for stiffness in lumbar spine and tightness in lumbar musculature. Pt with improving strength and ability for strengthening. Pt still limited with R SB motion, as well as pain into R glute. Plan to continue manual for ROM and pain and progress strength as tolerated.     Examination-Activity Limitations Bend;Transfers;Squat;Stand;Lift;Locomotion Level    Examination-Participation Restrictions Cleaning;Yard Work;Community Activity;Shop    Stability/Clinical Decision Making Stable/Uncomplicated    Rehab Potential Good    PT Frequency 2x / week    PT Duration 6 weeks    PT Treatment/Interventions ADLs/Self Care Home Management;Cryotherapy;Electrical Stimulation;DME Instruction;Ultrasound;Traction;Moist Heat;Iontophoresis 4mg /ml Dexamethasone;Gait training;Stair training;Functional mobility training;Therapeutic activities;Therapeutic exercise;Balance training;Patient/family education;Neuromuscular re-education;Manual techniques;Passive range of motion;Taping;Dry needling;Spinal Manipulations;Joint Manipulations    PT Home Exercise Plan Access Code [email or web address]    Consulted and Agree with Plan of Care Patient           Patient will benefit from skilled therapeutic intervention in order to improve the following deficits and impairments:  Pain,Improper body mechanics,Increased muscle spasms,Decreased mobility,Decreased activity tolerance,Decreased endurance,Decreased range of motion,Decreased strength,Impaired flexibility,Difficulty walking,Decreased balance  Visit Diagnosis: Chronic bilateral low back pain without sciatica     Problem List Patient Active Problem List   Diagnosis Date Noted  . Facet degeneration of lumbar region 01/06/2020  . Chondromalacia, knee, right 11/05/2017  . Osteoarthrosis, localized, primary, knee, right 11/05/2017  . Major depression, chronic 04/16/2017  . Mixed hyperlipidemia 04/16/2017  . Clostridium difficile infection 04/16/2017  . Family history of early CAD 04/16/2017  . Bilateral sensorineural hearing loss 02/06/2016  . Postablative hypothyroidism 12/20/2015  . History of Graves' disease 03/22/2015  . OAB (overactive bladder) 12/16/2013  . GERD (gastroesophageal reflux disease) 12/13/2012  . Osteoarthritis of [place] joint of  thumb 08/13/2012  . Seasonal allergies 08/13/2012  . Urge and stress incontinence 08/13/2012  . Osteopenia 08/05/2012  . DDD (degenerative disc disease), lumbosacral 10/22/2010   Lyndee Hensen, PT, DPT 12:59 PM  07/05/20    [facility] [place], [place], [address] Phone: [phone]   Fax:  [phone]  Name: Carrie San Gilmore MRN: [id number] Date of Birth: 04-05-1941

## 2020-07-07 ENCOUNTER — Encounter: Payer: Self-pay | Admitting: Physical Therapy

## 2020-07-07 ENCOUNTER — Ambulatory Visit (INDEPENDENT_AMBULATORY_CARE_PROVIDER_SITE_OTHER): Payer: Medicare Other | Admitting: Physical Therapy

## 2020-07-07 ENCOUNTER — Other Ambulatory Visit: Payer: Self-pay

## 2020-07-07 DIAGNOSIS — M545 Low back pain, unspecified: Secondary | ICD-10-CM

## 2020-07-07 DIAGNOSIS — G8929 Other chronic pain: Secondary | ICD-10-CM | POA: Diagnosis not present

## 2020-07-07 NOTE — Progress Notes (Signed)
    Subjective:    CC: Neck pain  I, Wendy Poet, LAT, ATC, am serving as scribe for Dr. [name].  HPI: Pt is a 80 y/o female presenting w/ neck pain x 6 weeks.  She locates her pain to bilat c-spine and into base of skull.  Of note, pt is currently being treated for low back pain at [facility] and has completed 6 sessions. PT has treated neck pain twice.  Radiating pain: no UE numbness/tingling: no UE weakness: no Aggravating factors: cervical rotation, driving Treatments tried: PT, heat, ice  Pertinent review of Systems: No fevers or chills  Relevant historical information: Hypothyroidism   Objective:    Vitals:   07/08/20 0805  BP: (!) 155/80  Pulse: 60  SpO2: 98%   General: Well Developed, well nourished, and in no acute distress.   MSK: C-spine normal. Nontender midline.  Tender palpation bilateral cervical paraspinal musculature. Significantly decreased cervical motion. Impression the strength reflexes and sensation are equal and normal throughout.  Lab and Radiology Results  X-ray images C-spine obtained today personally and independently interpreted Severe diffuse cervical DDD with some autofusion present. Await formal radiology review   Impression and Recommendations:    Assessment and Plan: 80 y.o. female with neck pain due to muscle dysfunction and spasm as well as significant degeneration.  Plan for physical therapy TENS unit heating pad.  Recheck back in about 6 weeks.  If not improved consider MRI for potential facet injection planning.Marland Kitchen  PDMP not reviewed this encounter. Orders Placed This Encounter  Procedures  . DG Cervical Spine 2 or 3 views    Standing Status:   Future    Number of Occurrences:   1    Standing Expiration Date:   07/08/2021    Order Specific Question:   Reason for Exam (SYMPTOM  OR DIAGNOSIS REQUIRED)    Answer:   eval neck pain    Order Specific Question:   Preferred imaging location?    Answer:   [facility]   . Ambulatory referral to Physical Therapy    Referral Priority:   Routine    Referral Type:   Physical Medicine    Referral Reason:   Specialty Services Required    Requested Specialty:   Physical Therapy   No orders of the defined types were placed in this encounter.   Discussed warning signs or symptoms. Please see discharge instructions. Patient expresses understanding.   The above documentation has been reviewed and is accurate and complete [name], M.D.

## 2020-07-08 ENCOUNTER — Encounter: Payer: Self-pay | Admitting: Physical Therapy

## 2020-07-08 ENCOUNTER — Ambulatory Visit (INDEPENDENT_AMBULATORY_CARE_PROVIDER_SITE_OTHER): Payer: Medicare Other

## 2020-07-08 ENCOUNTER — Ambulatory Visit (INDEPENDENT_AMBULATORY_CARE_PROVIDER_SITE_OTHER): Payer: Medicare Other | Admitting: Family Medicine

## 2020-07-08 VITALS — BP 155/80 | HR 60 | Ht 60.0 in | Wt 172.6 lb

## 2020-07-08 DIAGNOSIS — M542 Cervicalgia: Secondary | ICD-10-CM

## 2020-07-08 IMAGING — DX DG CERVICAL SPINE 2 OR 3 VIEWS
4 series · 4 of 4 positions shown · non-contrast
Comparison: None.

CLINICAL DATA: Posterior neck pain with limited range of motion x6
weeks.

EXAM:
CERVICAL SPINE - 2-3 VIEW

[c-spine lat]
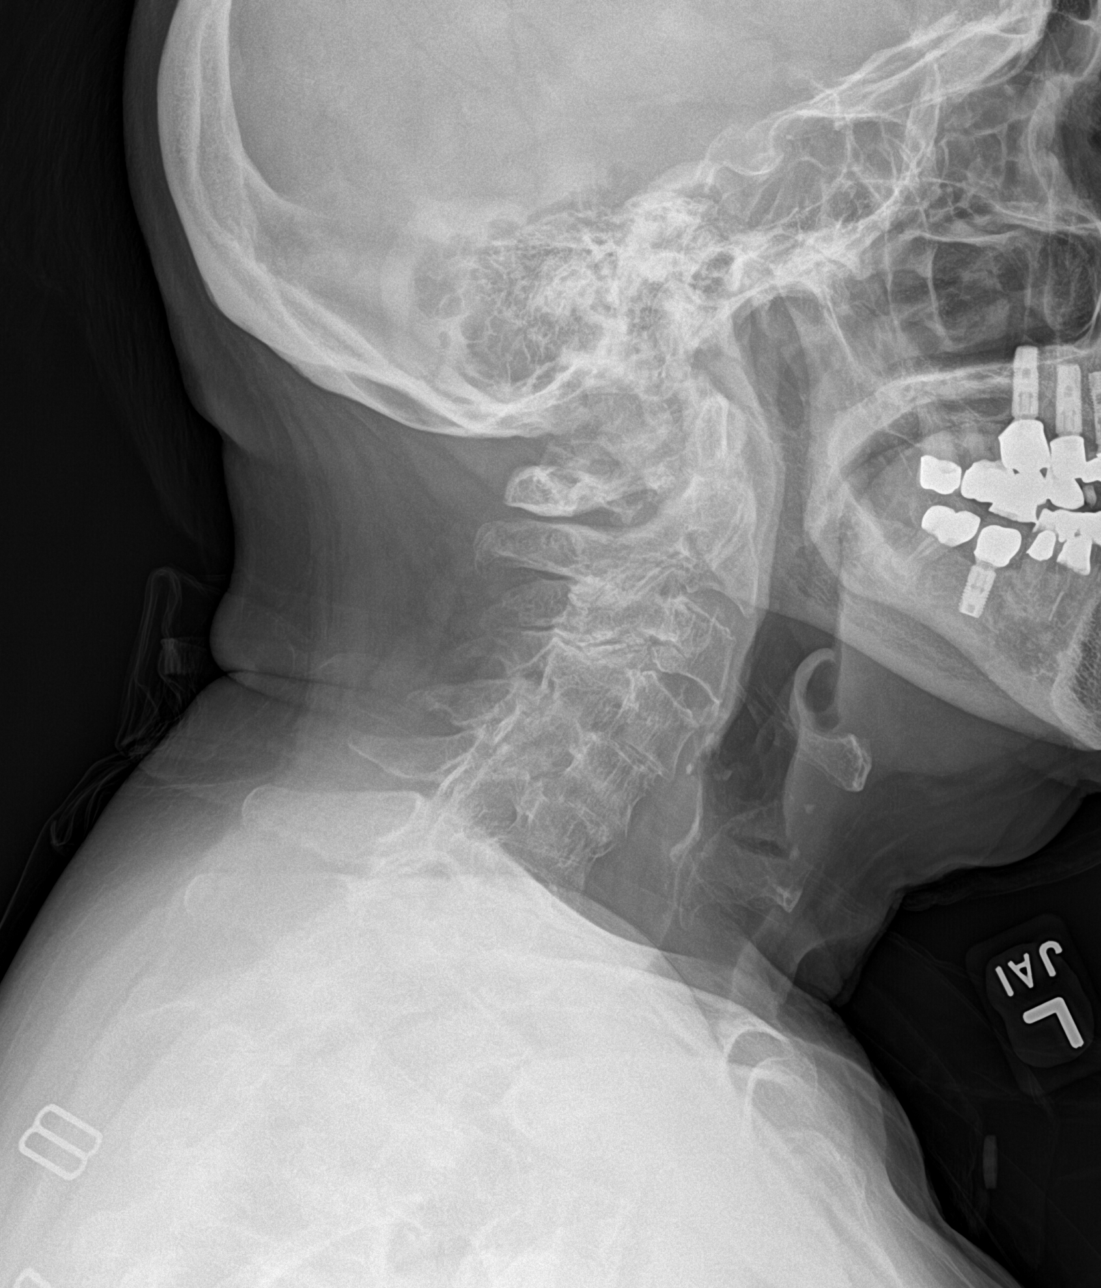

[c-spine ap (1 of 2)]
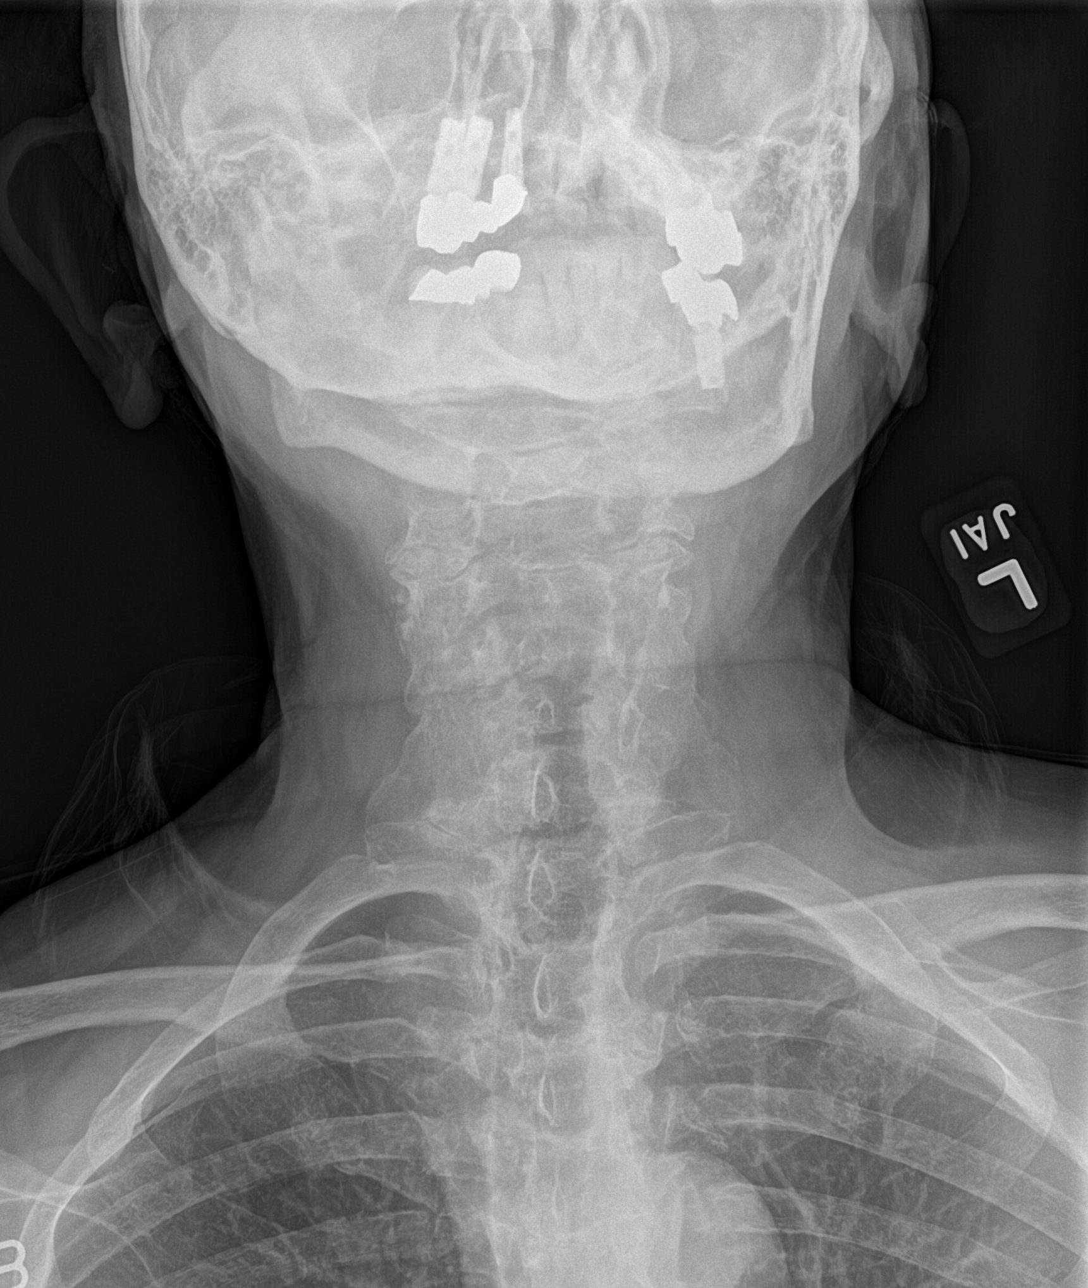

[c-spine open mouth]
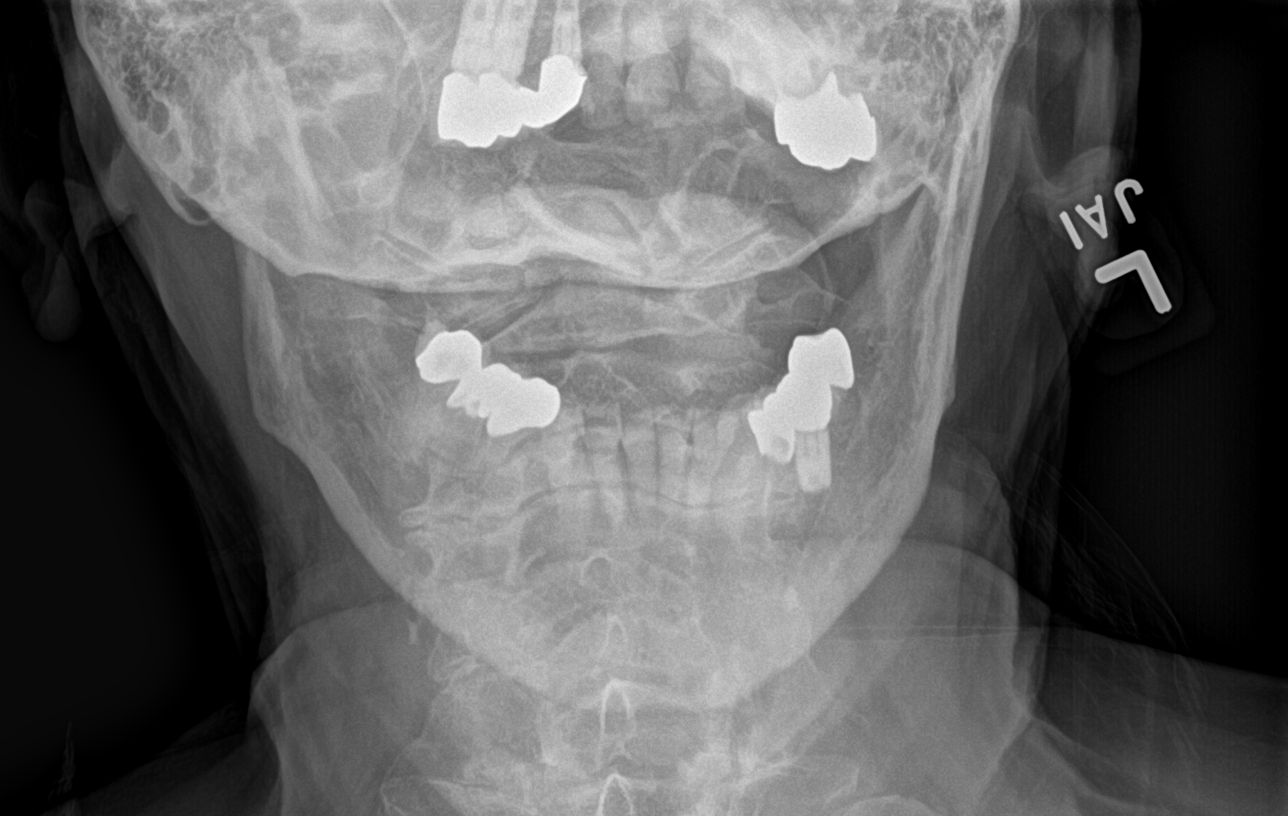

[c-spine ap (2 of 2)]
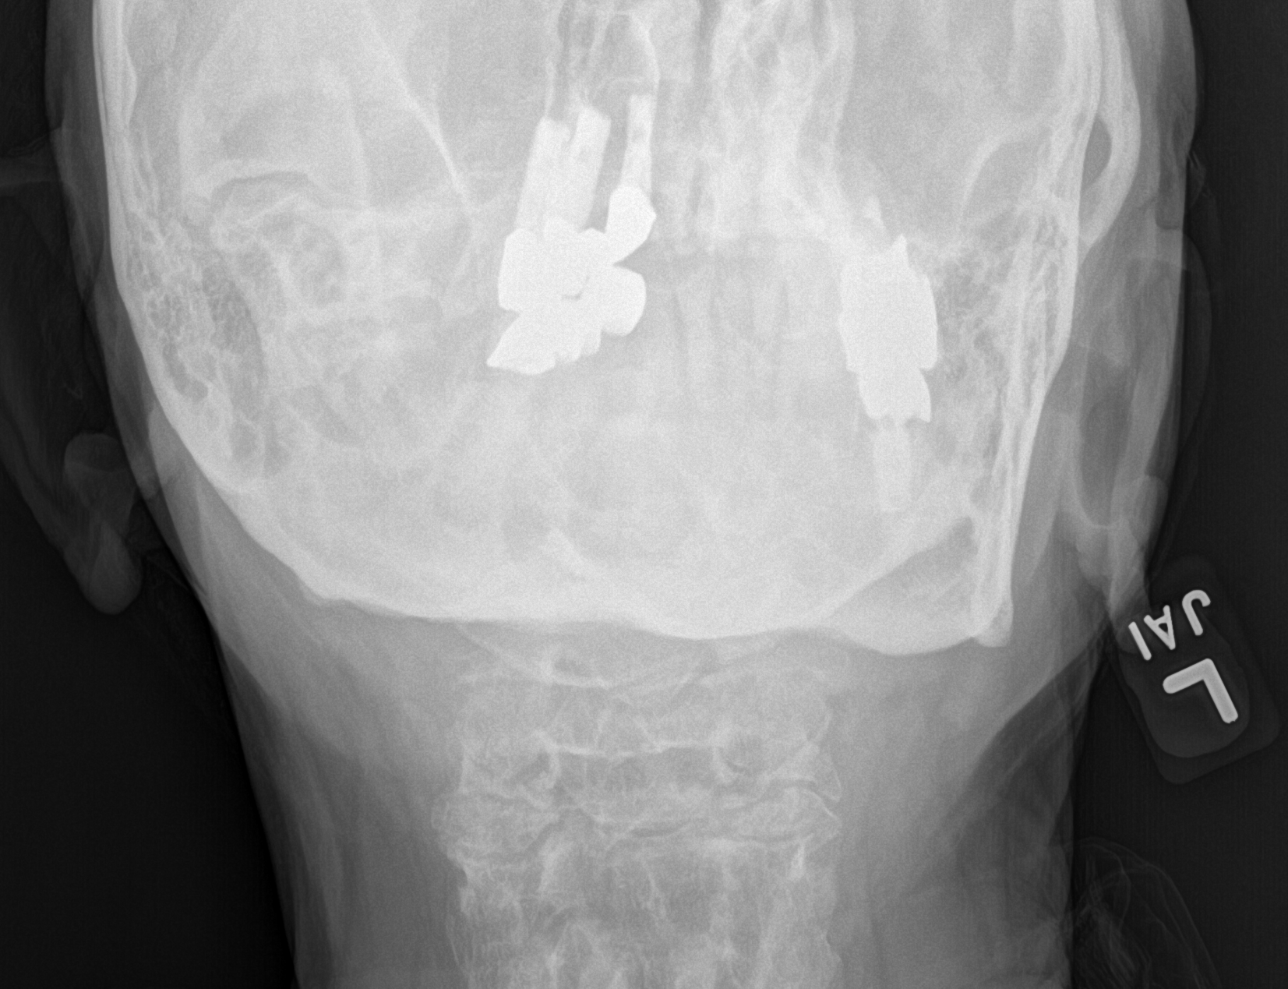

[4 of 4 positions shown; findings below may reference images not displayed]

FINDINGS: There are 6 cervical type vertebral bodies visualized on the lateral
view. There is no evidence of cervical spine fracture or
prevertebral soft tissue swelling. Preserved cervical spine
lordosis. Multilevel degenerative changes cervical spine with severe
lower cervical disc space narrowing, facet and uncovertebral
hypertrophy with grade 1 degenerative C3 on C4 anterolisthesis.
Dental hardware.
IMPRESSION: No acute osseous abnormality. Multilevel cervical spine spondylosis,
most prominent in the lower cervical spine.

## 2020-07-08 NOTE — Patient Instructions (Addendum)
Thank you for coming in today.  I've referred you to Physical Therapy.  Let us know if you don't hear from them in one week.  Please get an Xray today before you leave  Heat helps some.   TENS UNIT: This is helpful for muscle pain and spasm.   Search and Purchase a TENS 7000 2nd edition at  www.tenspros.com or www.[email or web address] It should be less than $30.     TENS unit instructions: Do not shower or bathe with the unit on . Turn the unit off before removing electrodes or batteries . If the electrodes lose stickiness add a drop of water to the electrodes after they are disconnected from the unit and place on plastic sheet. If you continued to have difficulty, call the TENS unit company to purchase more electrodes. . Do not apply lotion on the skin area prior to use. Make sure the skin is clean and dry as this will help prolong the life of the electrodes. . After use, always check skin for unusual red areas, rash or other skin difficulties. If there are any skin problems, does not apply electrodes to the same area. . Never remove the electrodes from the unit by pulling the wires. . Do not use the TENS unit or electrodes other than as directed. . Do not change electrode placement without consultating your therapist or physician. Marland Kitchen Keep 2 fingers with between each electrode. . Wear time ratio is 2:1, on to off times.    For example on for 30 minutes off for 15 minutes and then on for 30 minutes off for 15 minutes    Recheck in about 6 weeks.  Let me know if you need anything.

## 2020-07-08 NOTE — Therapy (Signed)
[facility] [address] [place], [place], [address] Phone: [phone]   Fax:  [phone]  Physical Therapy Treatment  Patient Details  Name: Martha Gilmore MRN: [id number] Date of Birth: 12-05-1940 Referring Provider (PT): Billey Chang   Encounter Date: 07/07/2020   PT End of Session - 07/08/20 1237    Visit Number 7    Number of Visits 12    Date for PT Re-Evaluation 07/26/20    Authorization Type Medicare    PT Start Time 0847    PT Stop Time 0930    PT Time Calculation (min) 43 min    Activity Tolerance Patient tolerated treatment well    Behavior During Therapy [facility] for tasks assessed/performed           Past Medical History:  Diagnosis Date  . Cataracts, bilateral 09/23/2015  . Clostridium difficile infection 04/16/2017   H/o C.diff due to clindamicyin 09/2016; two rounds of treatment for cure. Dr. Benson Norway  . Depression   . Hyperlipidemia   . Hypertension   . OAB (overactive bladder) 12/16/2013  . Osteopenia 08/05/2012   Dexa scan 07/2012 mild osteopenia, T = -1.1  . Seasonal allergies   . Urge and stress incontinence     Past Surgical History:  Procedure Laterality Date  . APPENDECTOMY  1971  . CESAREAN SECTION    . CHOLECYSTECTOMY    . Teeth Implants    . TRIGGER FINGER RELEASE    . VAGINAL HYSTERECTOMY      There were no vitals filed for this visit.   Subjective Assessment - 07/08/20 1236    Subjective Pt states soreness in R glute today, and with increased standing and activity.    Currently in Pain? Yes    Pain Score 3     Pain Location Back    Pain Orientation Right    Pain Descriptors / Indicators Sore;Spasm    Pain Type Chronic pain    Pain Onset More than a month ago    Pain Frequency Intermittent                             [facility] Adult PT Treatment/Exercise - 07/08/20 0001      Lumbar Exercises: Aerobic   Recumbent Bike L1/2 x 5 min      Lumbar Exercises: Standing    Other Standing Lumbar Exercises Hip Abd x10 bil;      Lumbar Exercises: Supine   Bent Knee Raise 20 reps    Bent Knee Raise Limitations RTB    Bridge 15 reps      Lumbar Exercises: Sidelying   Hip Abduction 20 reps    Other Sidelying Lumbar Exercises QL stretch20 sec x 3 on R:      Manual Therapy   Manual therapy comments skilled palpation and moitoring of soft tissue with dry needling.    Joint Mobilization Lumbar PA mobs    Soft tissue mobilization STm/DTM to R lumbar paraspinals  into R glute    Manual Traction Long leg distraction on R x 2 min for lumbar pump            Trigger Point Dry Needling - 07/08/20 0001    Consent Given? Yes    Education Handout Provided Previously provided    Muscles Treated Back/Hip Gluteus minimus;Gluteus maximus;Piriformis    Gluteus Minimus Response Palpable increased muscle length    Gluteus Maximus Response Palpable increased muscle length  Piriformis Response Palpable increased muscle length                  PT Short Term Goals - 06/26/20 1134      PT SHORT TERM GOAL #1   Title Pt to be independent with initial HEP    Time 2    Period Weeks    Status New    Target Date 06/28/20             PT Long Term Goals - 06/26/20 1134      PT LONG TERM GOAL #1   Title Pt to be independent with final HEP    Time 6    Period Weeks    Status New    Target Date 07/26/20      PT LONG TERM GOAL #2   Title Pt to report decreased pain in R lumbar region to 0-2/10 with IADLS.    Time 6    Period Weeks    Status New    Target Date 07/26/20      PT LONG TERM GOAL #3   Title Pt to demo optimal bend, squat, lift mechanics for improved back pain and ability for IADLS.    Time 6    Period Weeks    Status New    Target Date 07/26/20                 Plan - 07/08/20 1238    Clinical Impression Statement Pt with improved ability for R SB motion today, with less pain after session. Dry needling done for soreness and muscle  tension in R glute. Improving with ability for strengthening. Discussed doing more movement and stretching at times that R low back is painful to try to relieve. Pt states understanding, reviewed HEP. Pt seeing MD today for ongoing neck pain. Pt to benefit from continued care.    Examination-Activity Limitations Bend;Transfers;Squat;Stand;Lift;Locomotion Level    Examination-Participation Restrictions Cleaning;Yard Work;Community Activity;Shop    Stability/Clinical Decision Making Stable/Uncomplicated    Rehab Potential Good    PT Frequency 2x / week    PT Duration 6 weeks    PT Treatment/Interventions ADLs/Self Care Home Management;Cryotherapy;Electrical Stimulation;DME Instruction;Ultrasound;Traction;Moist Heat;Iontophoresis 4mg /ml Dexamethasone;Gait training;Stair training;Functional mobility training;Therapeutic activities;Therapeutic exercise;Balance training;Patient/family education;Neuromuscular re-education;Manual techniques;Passive range of motion;Taping;Dry needling;Spinal Manipulations;Joint Manipulations    PT Home Exercise Plan Access Code [email or web address]    Consulted and Agree with Plan of Care Patient           Patient will benefit from skilled therapeutic intervention in order to improve the following deficits and impairments:  Pain,Improper body mechanics,Increased muscle spasms,Decreased mobility,Decreased activity tolerance,Decreased endurance,Decreased range of motion,Decreased strength,Impaired flexibility,Difficulty walking,Decreased balance  Visit Diagnosis: Chronic bilateral low back pain without sciatica     Problem List Patient Active Problem List   Diagnosis Date Noted  . Facet degeneration of lumbar region 01/06/2020  . Chondromalacia, knee, right 11/05/2017  . Osteoarthrosis, localized, primary, knee, right 11/05/2017  . Major depression, chronic 04/16/2017  . Mixed hyperlipidemia 04/16/2017  . Clostridium difficile infection 04/16/2017  . Family history of early  CAD 04/16/2017  . Bilateral sensorineural hearing loss 02/06/2016  . Postablative hypothyroidism 12/20/2015  . History of Graves' disease 03/22/2015  . OAB (overactive bladder) 12/16/2013  . GERD (gastroesophageal reflux disease) 12/13/2012  . Osteoarthritis of [place] joint of thumb 08/13/2012  . Seasonal allergies 08/13/2012  . Urge and stress incontinence 08/13/2012  . Osteopenia 08/05/2012  . DDD (degenerative disc disease), lumbosacral 10/22/2010  Lyndee Hensen, PT, DPT 12:42 PM  07/08/20    [facility] [place], [place], [address] Phone: [phone]   Fax:  [phone]  Name: Martha Gilmore MRN: [id number] Date of Birth: 1940/07/03

## 2020-07-12 ENCOUNTER — Other Ambulatory Visit: Payer: Self-pay

## 2020-07-12 ENCOUNTER — Ambulatory Visit (INDEPENDENT_AMBULATORY_CARE_PROVIDER_SITE_OTHER): Payer: Medicare Other | Admitting: Physical Therapy

## 2020-07-12 DIAGNOSIS — M545 Low back pain, unspecified: Secondary | ICD-10-CM | POA: Diagnosis not present

## 2020-07-12 DIAGNOSIS — M542 Cervicalgia: Secondary | ICD-10-CM

## 2020-07-12 DIAGNOSIS — G8929 Other chronic pain: Secondary | ICD-10-CM

## 2020-07-12 NOTE — Progress Notes (Signed)
X-ray cervical spine shows multilevel arthritis changes present especially in the lower portion of the cervical spine.

## 2020-07-14 ENCOUNTER — Other Ambulatory Visit: Payer: Self-pay

## 2020-07-14 ENCOUNTER — Ambulatory Visit (INDEPENDENT_AMBULATORY_CARE_PROVIDER_SITE_OTHER): Payer: Medicare Other | Admitting: Physical Therapy

## 2020-07-14 DIAGNOSIS — M545 Low back pain, unspecified: Secondary | ICD-10-CM

## 2020-07-14 DIAGNOSIS — G8929 Other chronic pain: Secondary | ICD-10-CM | POA: Diagnosis not present

## 2020-07-14 DIAGNOSIS — M542 Cervicalgia: Secondary | ICD-10-CM

## 2020-07-18 ENCOUNTER — Encounter: Payer: Self-pay | Admitting: Physical Therapy

## 2020-07-18 NOTE — Therapy (Signed)
[facility] [address] [place], [place], [address] Phone: [phone]   Fax:  [phone]  Physical Therapy Treatment  Patient Details  Name: Martha Gilmore MRN: [id number] Date of Birth: [birth year] Referring Provider (PT): Billey Chang   Encounter Date: 07/14/2020   PT End of Session - 07/18/20 2121    Visit Number 9    Number of Visits 16    Date for PT Re-Evaluation 08/23/20    Authorization Type Medicare   Re-eval done at visit 8;    PT Start Time 0847    PT Stop Time 0931    PT Time Calculation (min) 44 min    Activity Tolerance Patient tolerated treatment well    Behavior During Therapy [facility] for tasks assessed/performed           Past Medical History:  Diagnosis Date  . Cataracts, bilateral 09/23/2015  . Clostridium difficile infection 04/16/2017   H/o C.diff due to clindamicyin 09/2016; two rounds of treatment for cure. Dr. Benson Norway  . Depression   . Hyperlipidemia   . Hypertension   . OAB (overactive bladder) 12/16/2013  . Osteopenia 08/05/2012   Dexa scan 07/2012 mild osteopenia, T = -1.1  . Seasonal allergies   . Urge and stress incontinence     Past Surgical History:  Procedure Laterality Date  . APPENDECTOMY  1971  . CESAREAN SECTION    . CHOLECYSTECTOMY    . Teeth Implants    . TRIGGER FINGER RELEASE    . VAGINAL HYSTERECTOMY      There were no vitals filed for this visit.   Subjective Assessment - 07/18/20 2118    Subjective Pt states soreness in L side of neck in last couple days, with most activities. Back "not too bad today"    Currently in Pain? Yes    Pain Score 2     Pain Location Back    Pain Orientation Right    Pain Descriptors / Indicators Aching;Sore    Pain Type Chronic pain    Pain Onset More than a month ago    Pain Frequency Intermittent    Pain Score 4    Pain Location Neck    Pain Orientation Right;Left    Pain Descriptors / Indicators Aching    Pain Type Chronic pain    Pain  Onset 1 to 4 weeks ago    Pain Frequency Intermittent                             [facility] Adult PT Treatment/Exercise - 07/18/20 2114      Lumbar Exercises: Stretches   Other Lumbar Stretch Exercise Standing QL stretch bil;      Lumbar Exercises: Aerobic   Recumbent Bike L2 x 6 min;      Lumbar Exercises: Standing   Row 20 reps    Theraband Level (Row) Level 3 (Green)    Other Standing Lumbar Exercises Hip Abd x10 bil;    Other Standing Lumbar Exercises March with unilateral 5lb hold x 10 bil;      Lumbar Exercises: Supine   Bridge 20 reps      Lumbar Exercises: Sidelying   Hip Abduction 20 reps      Manual Therapy   Manual therapy comments skilled palpation and moitoring of soft tissue with dry needling.    Joint Mobilization Lumbar and cervical PA mobs,    Soft tissue mobilization STm/DTM bil cervical paraspinals, UTs,  TPR to L mid cervcial;  DTM R glute min    Passive ROM Manual UT stretches,    Manual Traction Long leg distraction on R x 2 min for lumbar pump            Trigger Point Dry Needling - 07/18/20 0001    Consent Given? Yes    Education Handout Provided Previously provided    Muscles Treated Head and Neck Upper trapezius;Cervical multifidi    Upper Trapezius Response Twitch reponse elicited;Palpable increased muscle length   L   Cervical multifidi Response Palpable increased muscle length   L                 PT Short Term Goals - 07/18/20 2050      PT SHORT TERM GOAL #1   Title Pt to be independent with initial HEP (for back)    Time 2    Period Weeks    Status Achieved    Target Date 06/28/20      PT SHORT TERM GOAL #2   Title Pt to be independent with initial HEP for neck    Time 2    Period Weeks    Status New    Target Date 07/26/20             PT Long Term Goals - 07/18/20 2053      PT LONG TERM GOAL #1   Title Pt to be independent with final HEP    Time 6    Period Weeks    Status Partially Met     Target Date 08/23/20      PT LONG TERM GOAL #2   Title Pt to report decreased pain in R lumbar region to 0-2/10 with IADLS.    Time 6    Period Weeks    Status Partially Met    Target Date 08/23/20      PT LONG TERM GOAL #3   Title Pt to demo optimal bend, squat, lift mechanics for improved back pain and ability for IADLS.    Time 6    Period Weeks    Status Partially Met    Target Date 08/23/20      PT LONG TERM GOAL #4   Title Pt to demo ability for cervical ROM to be [facility] with pain 0-1/10.    Time 6    Period Weeks    Status New    Target Date 08/23/20                 Plan - 07/18/20 2122    Clinical Impression Statement Pt with quite a bit of soreness in L cervical region with palaption, as well as with attempts for L rotation. Manual and dry needling done today for pain. Pt with improving strength of hips and core, but back pain variable. Pt to benefit from continued care.    Examination-Activity Limitations Bend;Transfers;Squat;Stand;Lift;Locomotion Level    Examination-Participation Restrictions Cleaning;Yard Work;Community Activity;Shop    Stability/Clinical Decision Making Stable/Uncomplicated    Rehab Potential Good    PT Frequency 2x / week    PT Duration 6 weeks    PT Treatment/Interventions ADLs/Self Care Home Management;Cryotherapy;Electrical Stimulation;DME Instruction;Ultrasound;Traction;Moist Heat;Iontophoresis 66m/ml Dexamethasone;Gait training;Stair training;Functional mobility training;Therapeutic activities;Therapeutic exercise;Balance training;Patient/family education;Neuromuscular re-education;Manual techniques;Passive range of motion;Taping;Dry needling;Spinal Manipulations;Joint Manipulations    PT Home Exercise Plan Access Code [email or web address]   Consulted and Agree with Plan of Care Patient           Patient will  benefit from skilled therapeutic intervention in order to improve the following deficits and impairments:  Pain,Improper body  mechanics,Increased muscle spasms,Decreased mobility,Decreased activity tolerance,Decreased endurance,Decreased range of motion,Decreased strength,Impaired flexibility,Difficulty walking,Decreased balance  Visit Diagnosis: Chronic bilateral low back pain without sciatica  Cervicalgia     Problem List Patient Active Problem List   Diagnosis Date Noted  . Facet degeneration of lumbar region 01/06/2020  . Chondromalacia, knee, right 11/05/2017  . Osteoarthrosis, localized, primary, knee, right 11/05/2017  . Major depression, chronic 04/16/2017  . Mixed hyperlipidemia 04/16/2017  . Clostridium difficile infection 04/16/2017  . Family history of early CAD 04/16/2017  . Bilateral sensorineural hearing loss 02/06/2016  . Postablative hypothyroidism 12/20/2015  . History of Graves' disease 03/22/2015  . OAB (overactive bladder) 12/16/2013  . GERD (gastroesophageal reflux disease) 12/13/2012  . Osteoarthritis of [place] joint of thumb 08/13/2012  . Seasonal allergies 08/13/2012  . Urge and stress incontinence 08/13/2012  . Osteopenia 08/05/2012  . DDD (degenerative disc disease), lumbosacral 10/22/2010    Lyndee Hensen, PT, DPT 9:26 PM  07/18/20    [facility] [place], [place], [address] Phone: [phone]   Fax:  [phone]  Name: Martha Gilmore MRN: [id number] Date of Birth: 1940-10-05

## 2020-07-18 NOTE — Therapy (Signed)
[facility] [address] [place], [place], [address] Phone: [phone]   Fax:  [phone]  Physical Therapy Treatment/Re-Eval  Patient Details  Name: Martha Gilmore MRN: [id number] Date of Birth: 12-Feb-1941 Referring Provider (PT): Billey Chang   Encounter Date: 07/12/2020   PT End of Session - 07/18/20 2049    Visit Number 8    Number of Visits 16    Date for PT Re-Evaluation 08/23/20    Authorization Type Medicare   Re-eval done at visit 8;    PT Start Time 0846    PT Stop Time 0930    PT Time Calculation (min) 44 min    Activity Tolerance Patient tolerated treatment well    Behavior During Therapy [facility] for tasks assessed/performed           Past Medical History:  Diagnosis Date  . Cataracts, bilateral 09/23/2015  . Clostridium difficile infection 04/16/2017   H/o C.diff due to clindamicyin 09/2016; two rounds of treatment for cure. Dr. Benson Norway  . Depression   . Hyperlipidemia   . Hypertension   . OAB (overactive bladder) 12/16/2013  . Osteopenia 08/05/2012   Dexa scan 07/2012 mild osteopenia, T = -1.1  . Seasonal allergies   . Urge and stress incontinence     Past Surgical History:  Procedure Laterality Date  . APPENDECTOMY  1971  . CESAREAN SECTION    . CHOLECYSTECTOMY    . Teeth Implants    . TRIGGER FINGER RELEASE    . VAGINAL HYSTERECTOMY      There were no vitals filed for this visit.   Subjective Assessment - 07/18/20 2042    Subjective Pt states new onset of pain a few weeks ago. Normally has increased pain from time to time, but this time, has not gone away like it normally does. Feels neck is stiff turning to the L, notes pain driving, and with regular activities, ADLs. Saw MD last week, and obtained new script for neck pain. Low back still sore, pt thinks she is doing better, but still sore in R glute in Ams.    Patient Stated Goals Decreased pain in neck and back    Currently in Pain? Yes    Pain Score  2     Pain Location Back    Pain Orientation Right    Pain Descriptors / Indicators Aching;Sore    Pain Type Chronic pain    Pain Onset More than a month ago    Pain Frequency Intermittent    Aggravating Factors  prolonged standing, walking, cleaning, first thing in AM.    Pain Score 4    Pain Location Neck    Pain Orientation Left;Right    Pain Descriptors / Indicators Aching    Pain Type Chronic pain    Pain Onset 1 to 4 weeks ago    Pain Frequency Intermittent    Aggravating Factors  rotation, driving, reading.              [facility] PT Assessment - 07/18/20 0001      AROM   Overall AROM Comments Cervical: mod limitation for L rotation with pain, Mild/mod limiation for R rotation, mod/signficiant limitation for extension, mild limitation for flexion      Strength   Overall Strength Comments hips: 4/5,      Palpation   Palpation comment Pain in L mid cervical region in parspinals, soreness with mid cervical PAs, Pain in L mid cervical region with L rotation.  [facility] Adult PT Treatment/Exercise - 07/18/20 0001      Lumbar Exercises: Stretches   Other Lumbar Stretch Exercise Standing QL stretch bil;      Lumbar Exercises: Standing   Row 20 reps    Theraband Level (Row) Level 3 (Green)      Lumbar Exercises: Supine   Bridge 20 reps      Manual Therapy   Joint Mobilization Lumbar and cervical PA mobs,    Soft tissue mobilization STm/DTM bil cervical paraspinals, UTs,    Passive ROM Manual UT stretches,    Manual Traction Long leg distraction on R x 2 min for lumbar pump                  PT Education - 07/18/20 2049    Education Details HEP,    Person(s) Educated Patient    Methods Explanation;Demonstration;Tactile cues;Verbal cues;Handout    Comprehension Verbalized understanding;Returned demonstration;Verbal cues required;Tactile cues required;Need further instruction            PT Short Term Goals - 07/18/20 2050       PT SHORT TERM GOAL #1   Title Pt to be independent with initial HEP (for back)    Time 2    Period Weeks    Status Achieved    Target Date 06/28/20      PT SHORT TERM GOAL #2   Title Pt to be independent with initial HEP for neck    Time 2    Period Weeks    Status New    Target Date 07/26/20             PT Long Term Goals - 07/18/20 2053      PT LONG TERM GOAL #1   Title Pt to be independent with final HEP    Time 6    Period Weeks    Status Partially Met    Target Date 08/23/20      PT LONG TERM GOAL #2   Title Pt to report decreased pain in R lumbar region to 0-2/10 with IADLS.    Time 6    Period Weeks    Status Partially Met    Target Date 08/23/20      PT LONG TERM GOAL #3   Title Pt to demo optimal bend, squat, lift mechanics for improved back pain and ability for IADLS.    Time 6    Period Weeks    Status Partially Met    Target Date 08/23/20      PT LONG TERM GOAL #4   Title Pt to demo ability for cervical ROM to be [facility] with pain 0-1/10.    Time 6    Period Weeks    Status New    Target Date 08/23/20                 Plan - 07/18/20 2057    Clinical Impression Statement Pt evaluated for neck pain today, will add in neck diagnosis as well as continue to treat back pain. Pt with increased pain in Bil, L>R cervical region. She has limited ROM for rotation due to increased pain. Pt with tightness in surrounding musculature as well. Pt with some improvments in R glute pain after dry needling last visit, but continues to have mild stiffness and soreness, wiht increased standing, walking, and first thing in AM. Pt with increased pain that is limiting ability for functional activity, and will benefit from skilled PT to improve deficits and  pain.    Examination-Activity Limitations Bend;Transfers;Squat;Stand;Lift;Locomotion Level    Examination-Participation Restrictions Cleaning;Yard Work;Community Activity;Shop    Stability/Clinical Decision  Making Stable/Uncomplicated    Rehab Potential Good    PT Frequency 2x / week    PT Duration 6 weeks    PT Treatment/Interventions ADLs/Self Care Home Management;Cryotherapy;Electrical Stimulation;DME Instruction;Ultrasound;Traction;Moist Heat;Iontophoresis 85m/ml Dexamethasone;Gait training;Stair training;Functional mobility training;Therapeutic activities;Therapeutic exercise;Balance training;Patient/family education;Neuromuscular re-education;Manual techniques;Passive range of motion;Taping;Dry needling;Spinal Manipulations;Joint Manipulations    PT Home Exercise Plan Access Code [email or web address]   Consulted and Agree with Plan of Care Patient           Patient will benefit from skilled therapeutic intervention in order to improve the following deficits and impairments:  Pain,Improper body mechanics,Increased muscle spasms,Decreased mobility,Decreased activity tolerance,Decreased endurance,Decreased range of motion,Decreased strength,Impaired flexibility,Difficulty walking,Decreased balance  Visit Diagnosis: Chronic bilateral low back pain without sciatica - Plan: PT plan of care cert/re-cert  Cervicalgia - Plan: PT plan of care cert/re-cert     Problem List Patient Active Problem List   Diagnosis Date Noted  . Facet degeneration of lumbar region 01/06/2020  . Chondromalacia, knee, right 11/05/2017  . Osteoarthrosis, localized, primary, knee, right 11/05/2017  . Major depression, chronic 04/16/2017  . Mixed hyperlipidemia 04/16/2017  . Clostridium difficile infection 04/16/2017  . Family history of early CAD 04/16/2017  . Bilateral sensorineural hearing loss 02/06/2016  . Postablative hypothyroidism 12/20/2015  . History of Graves' disease 03/22/2015  . OAB (overactive bladder) 12/16/2013  . GERD (gastroesophageal reflux disease) 12/13/2012  . [place] 08/13[place]2012  . Seasonal allergies 08/13/2012  . Urge and stress incontinence 08/13/2012  . Osteopenia  08/05/2012  . DDD (degenerative disc disease), lumbosacral 10/22/2010    LLyndee Hensen PT, DPT 9:09 PM  07/18/20    [facility] [place] [address]Phone: [phone]  Fax:  [phone] Name: Martha CSan MarinoMRN: [id number] of Birth: [birth year]

## 2020-07-20 ENCOUNTER — Encounter: Payer: Self-pay | Admitting: Physical Therapy

## 2020-07-20 ENCOUNTER — Other Ambulatory Visit: Payer: Self-pay

## 2020-07-20 ENCOUNTER — Ambulatory Visit (INDEPENDENT_AMBULATORY_CARE_PROVIDER_SITE_OTHER): Payer: Medicare Other | Admitting: Physical Therapy

## 2020-07-20 DIAGNOSIS — M545 Low back pain, unspecified: Secondary | ICD-10-CM | POA: Diagnosis not present

## 2020-07-20 DIAGNOSIS — M542 Cervicalgia: Secondary | ICD-10-CM | POA: Diagnosis not present

## 2020-07-20 DIAGNOSIS — G8929 Other chronic pain: Secondary | ICD-10-CM | POA: Diagnosis not present

## 2020-07-20 NOTE — Therapy (Signed)
[facility] [address] [place], [place], [address] Phone: [phone]   Fax:  [phone]  Physical Therapy Treatment  Patient Details  Name: Martha Gilmore MRN: [id number] Date of Birth: 12/14/1940 Referring Provider (PT): Billey Chang   Encounter Date: 07/20/2020   PT End of Session - 07/20/20 1418    Visit Number 10    Number of Visits 16    Date for PT Re-Evaluation 08/23/20    Authorization Type Medicare   Re-eval done at visit 8;    PT Start Time 0845    PT Stop Time 0930    PT Time Calculation (min) 45 min    Activity Tolerance Patient tolerated treatment well    Behavior During Therapy [facility] for tasks assessed/performed           Past Medical History:  Diagnosis Date  . Cataracts, bilateral 09/23/2015  . Clostridium difficile infection 04/16/2017   H/o C.diff due to clindamicyin 09/2016; two rounds of treatment for cure. Dr. Benson Norway  . Depression   . Hyperlipidemia   . Hypertension   . OAB (overactive bladder) 12/16/2013  . Osteopenia 08/05/2012   Dexa scan 07/2012 mild osteopenia, T = -1.1  . Seasonal allergies   . Urge and stress incontinence     Past Surgical History:  Procedure Laterality Date  . APPENDECTOMY  1971  . CESAREAN SECTION    . CHOLECYSTECTOMY    . Teeth Implants    . TRIGGER FINGER RELEASE    . VAGINAL HYSTERECTOMY      There were no vitals filed for this visit.   Subjective Assessment - 07/20/20 1417    Subjective Pt states back has been doing better, minimal pain, and less pain in mornings. Neck still very bothersome. Pain in L, mostly with L >R rotation.    Currently in Pain? Yes    Pain Score 1     Pain Location Back    Pain Orientation Right    Pain Descriptors / Indicators Aching;Sore    Pain Type Chronic pain    Pain Onset More than a month ago    Pain Frequency Intermittent    Pain Score 5    Pain Location Neck    Pain Orientation Right;Left    Pain Descriptors / Indicators  Aching    Pain Type Chronic pain    Pain Onset More than a month ago    Pain Frequency Intermittent                             [facility] Adult PT Treatment/Exercise - 07/20/20 0001      Lumbar Exercises: Stretches   Other Lumbar Stretch Exercise Standing QL stretch bil;      Lumbar Exercises: Aerobic   Recumbent Bike L2 x 8 min;      Lumbar Exercises: Standing   Row 20 reps    Theraband Level (Row) Level 3 (Green)    Other Standing Lumbar Exercises Hip Abd x10 bil;    Other Standing Lumbar Exercises March with unilateral 5lb hold x 10 bil; Shoulder Bil ER x 20 YTB      Lumbar Exercises: Supine   Bridge 20 reps      Lumbar Exercises: Sidelying   Hip Abduction 20 reps      Manual Therapy   Manual therapy comments skilled palpation and moitoring of soft tissue with dry needling.    Joint Mobilization Lumbar and cervical PA  mobs,    Soft tissue mobilization STm/DTM bil cervical paraspinals, TPR to L mid cervcial;    Passive ROM Manual UT stretches,            Trigger Point Dry Needling - 07/20/20 0001    Consent Given? Yes    Education Handout Provided Previously provided    Cervical multifidi Response Palpable increased muscle length   L c3-5                 PT Short Term Goals - 07/18/20 2050      PT SHORT TERM GOAL #1   Title Pt to be independent with initial HEP (for back)    Time 2    Period Weeks    Status Achieved    Target Date 06/28/20      PT SHORT TERM GOAL #2   Title Pt to be independent with initial HEP for neck    Time 2    Period Weeks    Status New    Target Date 07/26/20             PT Long Term Goals - 07/18/20 2053      PT LONG TERM GOAL #1   Title Pt to be independent with final HEP    Time 6    Period Weeks    Status Partially Met    Target Date 08/23/20      PT LONG TERM GOAL #2   Title Pt to report decreased pain in R lumbar region to 0-2/10 with IADLS.    Time 6    Period Weeks    Status Partially  Met    Target Date 08/23/20      PT LONG TERM GOAL #3   Title Pt to demo optimal bend, squat, lift mechanics for improved back pain and ability for IADLS.    Time 6    Period Weeks    Status Partially Met    Target Date 08/23/20      PT LONG TERM GOAL #4   Title Pt to demo ability for cervical ROM to be [facility] with pain 0-1/10.    Time 6    Period Weeks    Status New    Target Date 08/23/20                 Plan - 07/20/20 1422    Clinical Impression Statement Pt with improving pain in back and into R glute. Pain in neck continues to be very bothersome. Most pain on L with L >R rotation. More pain in high cervical region, radiating into ear today with rotation. Minimal tension/pain in SO muscles. Plan to see pt for 1-2 more weeks, and will refer back to MD if pain in neck not improving.    Examination-Activity Limitations Bend;Transfers;Squat;Stand;Lift;Locomotion Level    Examination-Participation Restrictions Cleaning;Yard Work;Community Activity;Shop    Stability/Clinical Decision Making Stable/Uncomplicated    Rehab Potential Good    PT Frequency 2x / week    PT Duration 6 weeks    PT Treatment/Interventions ADLs/Self Care Home Management;Cryotherapy;Electrical Stimulation;DME Instruction;Ultrasound;Traction;Moist Heat;Iontophoresis 70m/ml Dexamethasone;Gait training;Stair training;Functional mobility training;Therapeutic activities;Therapeutic exercise;Balance training;Patient/family education;Neuromuscular re-education;Manual techniques;Passive range of motion;Taping;Dry needling;Spinal Manipulations;Joint Manipulations    PT Home Exercise Plan Access Code [email or web address]   Consulted and Agree with Plan of Care Patient           Patient will benefit from skilled therapeutic intervention in order to improve the following deficits and impairments:  Pain,Improper body mechanics,Increased  muscle spasms,Decreased mobility,Decreased activity tolerance,Decreased endurance,Decreased  range of motion,Decreased strength,Impaired flexibility,Difficulty walking,Decreased balance  Visit Diagnosis: Chronic bilateral low back pain without sciatica  Cervicalgia     Problem List Patient Active Problem List   Diagnosis Date Noted  . Facet degeneration of lumbar region 01/06/2020  . Chondromalacia, knee, right 11/05/2017  . Osteoarthrosis, localized, primary, knee, right 11/05/2017  . Major depression, chronic 04/16/2017  . Mixed hyperlipidemia 04/16/2017  . Clostridium difficile infection 04/16/2017  . Family history of early CAD 04/16/2017  . Bilateral sensorineural hearing loss 02/06/2016  . Postablative hypothyroidism 12/20/2015  . History of Graves' disease 03/22/2015  . OAB (overactive bladder) 12/16/2013  . GERD (gastroesophageal reflux disease) 12/13/2012  . Osteoarthritis of [place] joint of thumb 08/13/2012  . Seasonal allergies 08/13/2012  . Urge and stress incontinence 08/13/2012  . Osteopenia 08/05/2012  . DDD (degenerative disc disease), lumbosacral 10/22/2010    Lyndee Hensen, PT, DPT 2:24 PM  07/20/20    [facility] [place], [place], [address] Phone: [phone]   Fax:  [phone]  Name: Lacheryl San Gilmore MRN: [id number] Date of Birth: 11-Apr-1941

## 2020-07-21 DIAGNOSIS — M9901 Segmental and somatic dysfunction of cervical region: Secondary | ICD-10-CM | POA: Diagnosis not present

## 2020-07-21 DIAGNOSIS — S29012A Strain of muscle and tendon of back wall of thorax, initial encounter: Secondary | ICD-10-CM | POA: Diagnosis not present

## 2020-07-21 DIAGNOSIS — M5136 Other intervertebral disc degeneration, lumbar region: Secondary | ICD-10-CM | POA: Diagnosis not present

## 2020-07-21 DIAGNOSIS — M5032 Other cervical disc degeneration, mid-cervical region, unspecified level: Secondary | ICD-10-CM | POA: Diagnosis not present

## 2020-07-21 DIAGNOSIS — M9903 Segmental and somatic dysfunction of lumbar region: Secondary | ICD-10-CM | POA: Diagnosis not present

## 2020-07-21 DIAGNOSIS — M9902 Segmental and somatic dysfunction of thoracic region: Secondary | ICD-10-CM | POA: Diagnosis not present

## 2020-07-22 ENCOUNTER — Ambulatory Visit (INDEPENDENT_AMBULATORY_CARE_PROVIDER_SITE_OTHER): Payer: Medicare Other | Admitting: Physical Therapy

## 2020-07-22 ENCOUNTER — Other Ambulatory Visit: Payer: Self-pay

## 2020-07-22 ENCOUNTER — Encounter: Payer: Self-pay | Admitting: Physical Therapy

## 2020-07-22 DIAGNOSIS — M545 Low back pain, unspecified: Secondary | ICD-10-CM

## 2020-07-22 DIAGNOSIS — G8929 Other chronic pain: Secondary | ICD-10-CM

## 2020-07-22 DIAGNOSIS — M542 Cervicalgia: Secondary | ICD-10-CM

## 2020-07-22 NOTE — Therapy (Signed)
[facility] [address] [place], [place], [address] Phone: [phone]   Fax:  [phone]  Physical Therapy Treatment  Patient Details  Name: Martha Gilmore MRN: [id number] Date of Birth: 15-Feb-1941 Referring Provider (PT): Billey Chang   Encounter Date: 07/22/2020   PT End of Session - 07/22/20 1555    Visit Number 11    Number of Visits 16    Date for PT Re-Evaluation 08/23/20    Authorization Type Medicare   Re-eval done at visit 8;    PT Start Time 0930    PT Stop Time 1014    PT Time Calculation (min) 44 min    Activity Tolerance Patient tolerated treatment well    Behavior During Therapy [facility] for tasks assessed/performed           Past Medical History:  Diagnosis Date  . Cataracts, bilateral 09/23/2015  . Clostridium difficile infection 04/16/2017   H/o C.diff due to clindamicyin 09/2016; two rounds of treatment for cure. Dr. Benson Norway  . Depression   . Hyperlipidemia   . Hypertension   . OAB (overactive bladder) 12/16/2013  . Osteopenia 08/05/2012   Dexa scan 07/2012 mild osteopenia, T = -1.1  . Seasonal allergies   . Urge and stress incontinence     Past Surgical History:  Procedure Laterality Date  . APPENDECTOMY  1971  . CESAREAN SECTION    . CHOLECYSTECTOMY    . Teeth Implants    . TRIGGER FINGER RELEASE    . VAGINAL HYSTERECTOMY      There were no vitals filed for this visit.   Subjective Assessment - 07/22/20 1015    Subjective Pt continues to have quite a bit of pain in neck. R side of back also hurting more today, states she did a lot of standing/walking in the past few days.    Patient Stated Goals Decreased pain in neck and back    Currently in Pain? Yes    Pain Score 3     Pain Location Back    Pain Orientation Right    Pain Descriptors / Indicators Aching;Sore    Pain Type Chronic pain    Pain Onset More than a month ago    Pain Frequency Intermittent    Pain Score 5    Pain Location Neck     Pain Orientation Left    Pain Descriptors / Indicators Aching    Pain Type Chronic pain    Pain Onset More than a month ago    Pain Frequency Intermittent    Aggravating Factors  turning head to L.                             [facility] Adult PT Treatment/Exercise - 07/22/20 0001      Lumbar Exercises: Stretches   Single Knee to Chest Stretch 2 reps;30 seconds    Pelvic Tilt 20 reps    Piriformis Stretch 2 reps;30 seconds    Other Lumbar Stretch Exercise OP heel press 5 sec x 15;      Lumbar Exercises: Standing   Row 20 reps    Theraband Level (Row) Level 3 (Green)      Lumbar Exercises: Supine   Bridge 20 reps    Other Supine Lumbar Exercises Horiz abd RTB x 20;      Modalities   Modalities Traction      Traction   Type of Traction Cervical  Min (lbs) 12    Max (lbs) 14    Time 5 min x 2= 10 min;      Manual Therapy   Manual therapy comments skilled palpation and moitoring of soft tissue with dry needling.    Joint Mobilization Cervical PAs    Soft tissue mobilization STm bil cervical paraspinals,    Manual Traction Long leg distraction on R x 2 min for lumbar pump            Trigger Point Dry Needling - 07/22/20 0001    Consent Given? Yes    Education Handout Provided Previously provided    Upper Trapezius Response Twitch reponse elicited;Palpable increased muscle length   L                 PT Short Term Goals - 07/18/20 2050      PT SHORT TERM GOAL #1   Title Pt to be independent with initial HEP (for back)    Time 2    Period Weeks    Status Achieved    Target Date 06/28/20      PT SHORT TERM GOAL #2   Title Pt to be independent with initial HEP for neck    Time 2    Period Weeks    Status New    Target Date 07/26/20             PT Long Term Goals - 07/18/20 2053      PT LONG TERM GOAL #1   Title Pt to be independent with final HEP    Time 6    Period Weeks    Status Partially Met    Target Date 08/23/20       PT LONG TERM GOAL #2   Title Pt to report decreased pain in R lumbar region to 0-2/10 with IADLS.    Time 6    Period Weeks    Status Partially Met    Target Date 08/23/20      PT LONG TERM GOAL #3   Title Pt to demo optimal bend, squat, lift mechanics for improved back pain and ability for IADLS.    Time 6    Period Weeks    Status Partially Met    Target Date 08/23/20      PT LONG TERM GOAL #4   Title Pt to demo ability for cervical ROM to be [facility] with pain 0-1/10.    Time 6    Period Weeks    Status New    Target Date 08/23/20                 Plan - 07/22/20 1557    Clinical Impression Statement Pt with mild increase in low back pain today. Ther ex and manual done for decompression and stretching. Continues to have quite a bit of pain in L mid-high cervical region with L rotation. Trial for mechanical traction today in attempts to relieve. Pt also saw chiropractor this week, with little relief of this pain.    Examination-Activity Limitations Bend;Transfers;Squat;Stand;Lift;Locomotion Level    Examination-Participation Restrictions Cleaning;Yard Work;Community Activity;Shop    Stability/Clinical Decision Making Stable/Uncomplicated    Rehab Potential Good    PT Frequency 2x / week    PT Duration 6 weeks    PT Treatment/Interventions ADLs/Self Care Home Management;Cryotherapy;Electrical Stimulation;DME Instruction;Ultrasound;Traction;Moist Heat;Iontophoresis 4mg /ml Dexamethasone;Gait training;Stair training;Functional mobility training;Therapeutic activities;Therapeutic exercise;Balance training;Patient/family education;Neuromuscular re-education;Manual techniques;Passive range of motion;Taping;Dry needling;Spinal Manipulations;Joint Manipulations    PT Home Exercise Plan Access  Code [email or web address]    Consulted and Agree with Plan of Care Patient           Patient will benefit from skilled therapeutic intervention in order to improve the following deficits and impairments:   Pain,Improper body mechanics,Increased muscle spasms,Decreased mobility,Decreased activity tolerance,Decreased endurance,Decreased range of motion,Decreased strength,Impaired flexibility,Difficulty walking,Decreased balance  Visit Diagnosis: Chronic bilateral low back pain without sciatica  Cervicalgia     Problem List Patient Active Problem List   Diagnosis Date Noted  . Facet degeneration of lumbar region 01/06/2020  . Chondromalacia, knee, right 11/05/2017  . Osteoarthrosis, localized, primary, knee, right 11/05/2017  . Major depression, chronic 04/16/2017  . Mixed hyperlipidemia 04/16/2017  . Clostridium difficile infection 04/16/2017  . Family history of early CAD 04/16/2017  . Bilateral sensorineural hearing loss 02/06/2016  . Postablative hypothyroidism 12/20/2015  . History of Graves' disease 03/22/2015  . OAB (overactive bladder) 12/16/2013  . GERD (gastroesophageal reflux disease) 12/13/2012  . Osteoarthritis of [place] joint of thumb 08/13/2012  . Seasonal allergies 08/13/2012  . Urge and stress incontinence 08/13/2012  . Osteopenia 08/05/2012  . DDD (degenerative disc disease), lumbosacral 10/22/2010    Lyndee Hensen, PT, DPT 4:00 PM  07/22/20    [facility] [place], [place], [address] Phone: [phone]   Fax:  [phone]  Name: Martha Gilmore MRN: [id number] Date of Birth: 03-21-1941

## 2020-07-26 ENCOUNTER — Other Ambulatory Visit: Payer: Self-pay

## 2020-07-26 ENCOUNTER — Encounter: Payer: Self-pay | Admitting: Physical Therapy

## 2020-07-26 ENCOUNTER — Ambulatory Visit (INDEPENDENT_AMBULATORY_CARE_PROVIDER_SITE_OTHER): Payer: Medicare Other | Admitting: Physical Therapy

## 2020-07-26 DIAGNOSIS — M5136 Other intervertebral disc degeneration, lumbar region: Secondary | ICD-10-CM | POA: Diagnosis not present

## 2020-07-26 DIAGNOSIS — M542 Cervicalgia: Secondary | ICD-10-CM | POA: Diagnosis not present

## 2020-07-26 DIAGNOSIS — M5032 Other cervical disc degeneration, mid-cervical region, unspecified level: Secondary | ICD-10-CM | POA: Diagnosis not present

## 2020-07-26 DIAGNOSIS — M545 Low back pain, unspecified: Secondary | ICD-10-CM

## 2020-07-26 DIAGNOSIS — M9901 Segmental and somatic dysfunction of cervical region: Secondary | ICD-10-CM | POA: Diagnosis not present

## 2020-07-26 DIAGNOSIS — M9903 Segmental and somatic dysfunction of lumbar region: Secondary | ICD-10-CM | POA: Diagnosis not present

## 2020-07-26 DIAGNOSIS — M9902 Segmental and somatic dysfunction of thoracic region: Secondary | ICD-10-CM | POA: Diagnosis not present

## 2020-07-26 DIAGNOSIS — G8929 Other chronic pain: Secondary | ICD-10-CM | POA: Diagnosis not present

## 2020-07-26 DIAGNOSIS — S29012A Strain of muscle and tendon of back wall of thorax, initial encounter: Secondary | ICD-10-CM | POA: Diagnosis not present

## 2020-07-28 ENCOUNTER — Encounter: Payer: Self-pay | Admitting: Physical Therapy

## 2020-07-28 ENCOUNTER — Ambulatory Visit (INDEPENDENT_AMBULATORY_CARE_PROVIDER_SITE_OTHER): Payer: Medicare Other | Admitting: Physical Therapy

## 2020-07-28 ENCOUNTER — Other Ambulatory Visit: Payer: Self-pay

## 2020-07-28 DIAGNOSIS — M9901 Segmental and somatic dysfunction of cervical region: Secondary | ICD-10-CM | POA: Diagnosis not present

## 2020-07-28 DIAGNOSIS — M545 Low back pain, unspecified: Secondary | ICD-10-CM | POA: Diagnosis not present

## 2020-07-28 DIAGNOSIS — M9902 Segmental and somatic dysfunction of thoracic region: Secondary | ICD-10-CM | POA: Diagnosis not present

## 2020-07-28 DIAGNOSIS — M5136 Other intervertebral disc degeneration, lumbar region: Secondary | ICD-10-CM | POA: Diagnosis not present

## 2020-07-28 DIAGNOSIS — M9903 Segmental and somatic dysfunction of lumbar region: Secondary | ICD-10-CM | POA: Diagnosis not present

## 2020-07-28 DIAGNOSIS — G8929 Other chronic pain: Secondary | ICD-10-CM | POA: Diagnosis not present

## 2020-07-28 DIAGNOSIS — M542 Cervicalgia: Secondary | ICD-10-CM

## 2020-07-28 DIAGNOSIS — M5032 Other cervical disc degeneration, mid-cervical region, unspecified level: Secondary | ICD-10-CM | POA: Diagnosis not present

## 2020-07-28 DIAGNOSIS — S29012A Strain of muscle and tendon of back wall of thorax, initial encounter: Secondary | ICD-10-CM | POA: Diagnosis not present

## 2020-07-28 NOTE — Therapy (Addendum)
[facility] [address] [place], [place], [address] Phone: [phone]   Fax:  [phone]  Physical Therapy Treatment  Patient Details  Name: Martha Gilmore MRN: [id number] Date of Birth: [birth year] Referring Provider (PT): Billey Chang   Encounter Date: 07/28/2020   PT End of Session - 07/28/20 0853    Visit Number 13    Number of Visits 16    Date for PT Re-Evaluation 08/23/20    Authorization Type Medicare   Re-eval done at visit 8;    PT Start Time 0847    PT Stop Time 0930    PT Time Calculation (min) 43 min    Activity Tolerance Patient tolerated treatment well    Behavior During Therapy [facility] for tasks assessed/performed           Past Medical History:  Diagnosis Date  . Cataracts, bilateral 09/23/2015  . Clostridium difficile infection 04/16/2017   H/o C.diff due to clindamicyin 09/2016; two rounds of treatment for cure. Dr. Benson Norway  . Depression   . Hyperlipidemia   . Hypertension   . OAB (overactive bladder) 12/16/2013  . Osteopenia 08/05/2012   Dexa scan 07/2012 mild osteopenia, T = -1.1  . Seasonal allergies   . Urge and stress incontinence     Past Surgical History:  Procedure Laterality Date  . APPENDECTOMY  1971  . CESAREAN SECTION    . CHOLECYSTECTOMY    . Teeth Implants    . TRIGGER FINGER RELEASE    . VAGINAL HYSTERECTOMY      There were no vitals filed for this visit.   Subjective Assessment - 07/28/20 0852    Subjective Pt states back doing well. She is having quite a bit of L sided neck pain in the last couple days. Was mildy nauseated after last session.    Currently in Pain? Yes    Pain Score 6     Pain Location Neck    Pain Orientation Left    Pain Descriptors / Indicators Aching    Pain Radiating Towards L rotation                             [facility] Adult PT Treatment/Exercise - 07/28/20 0855      Lumbar Exercises: Stretches   Single Knee to Chest Stretch 2 reps;30  seconds    Piriformis Stretch 2 reps;30 seconds      Lumbar Exercises: Aerobic   Recumbent Bike L1 x 7 min;      Lumbar Exercises: Standing   Row 20 reps    Theraband Level (Row) Level 3 (Green)    Other Standing Lumbar Exercises Hip Abd x10 bil;    Other Standing Lumbar Exercises Bwd stepping on R with glute activation x10; Bwd walking 20 ft x 2;      Lumbar Exercises: Supine   Bridge 20 reps      Lumbar Exercises: Sidelying   Hip Abduction 20 reps;Right      Modalities   Modalities Traction      Traction   Type of Traction --    Min (lbs) --    Max (lbs) --    Time --      Manual Therapy   Manual therapy comments skilled palpation and moitoring of soft tissue with dry needling.    Joint Mobilization Cervical PAs    Soft tissue mobilization STm bil cervical paraspinals,    Manual Traction Long leg distraction  on R x 2 min for lumbar pump            Trigger Point Dry Needling - 07/28/20 0951    Consent Given? Yes    Education Handout Provided Previously provided    Upper Trapezius Response Twitch reponse elicited;Palpable increased muscle length    Cervical multifidi Response Palpable increased muscle length                  PT Short Term Goals - 07/18/20 2050      PT SHORT TERM GOAL #1   Title Pt to be independent with initial HEP (for back)    Time 2    Period Weeks    Status Achieved    Target Date 06/28/20      PT SHORT TERM GOAL #2   Title Pt to be independent with initial HEP for neck    Time 2    Period Weeks    Status New    Target Date 07/26/20             PT Long Term Goals - 07/18/20 2053      PT LONG TERM GOAL #1   Title Pt to be independent with final HEP    Time 6    Period Weeks    Status Partially Met    Target Date 08/23/20      PT LONG TERM GOAL #2   Title Pt to report decreased pain in R lumbar region to 0-2/10 with IADLS.    Time 6    Period Weeks    Status Partially Met    Target Date 08/23/20      PT LONG  TERM GOAL #3   Title Pt to demo optimal bend, squat, lift mechanics for improved back pain and ability for IADLS.    Time 6    Period Weeks    Status Partially Met    Target Date 08/23/20      PT LONG TERM GOAL #4   Title Pt to demo ability for cervical ROM to be [facility] with pain 0-1/10.    Time 6    Period Weeks    Status New    Target Date 08/23/20                 Plan - 07/28/20 0953    Clinical Impression Statement Pt has made good improvements with low back. She continues to have mild soreness at times after increased standing/walking, but pain much less often and less intense. Pt doing very well with HEP for this, and feels she is doing a good job managing her symptoms. She continues to have significant pain in L side of neck. Most pain with L rotation. Recommended that she return to [facility] for further assessment. Minimal pain in soft tissue, or in sub occipitals. Do not expect closing restriction, due to pt being able to comfortably SB to L, and has also had recent cervical adjustment. Pt will hold at this time, until after MD visit. Final HEP reviewed in detail. Pt in agreement with plan.    Examination-Activity Limitations Bend;Transfers;Squat;Stand;Lift;Locomotion Level    Examination-Participation Restrictions Cleaning;Yard Work;Community Activity;Shop    Stability/Clinical Decision Making Stable/Uncomplicated    Rehab Potential Good    PT Frequency 2x / week    PT Duration 6 weeks    PT Treatment/Interventions ADLs/Self Care Home Management;Cryotherapy;Electrical Stimulation;DME Instruction;Ultrasound;Traction;Moist Heat;Iontophoresis 4mg /ml Dexamethasone;Gait training;Stair training;Functional mobility training;Therapeutic activities;Therapeutic exercise;Balance training;Patient/family education;Neuromuscular re-education;Manual techniques;Passive range of motion;Taping;Dry needling;Spinal Manipulations;Joint  Manipulations    PT Home Exercise Plan Access Code [email or web address]     Consulted and Agree with Plan of Care Patient           Patient will benefit from skilled therapeutic intervention in order to improve the following deficits and impairments:  Pain,Improper body mechanics,Increased muscle spasms,Decreased mobility,Decreased activity tolerance,Decreased endurance,Decreased range of motion,Decreased strength,Impaired flexibility,Difficulty walking,Decreased balance  Visit Diagnosis: Chronic bilateral low back pain without sciatica  Cervicalgia     Problem List Patient Active Problem List   Diagnosis Date Noted  . Facet degeneration of lumbar region 01/06/2020  . Chondromalacia, knee, right 11/05/2017  . Osteoarthrosis, localized, primary, knee, right 11/05/2017  . Major depression, chronic 04/16/2017  . Mixed hyperlipidemia 04/16/2017  . Clostridium difficile infection 04/16/2017  . Family history of early CAD 04/16/2017  . Bilateral sensorineural hearing loss 02/06/2016  . Postablative hypothyroidism 12/20/2015  . History of Graves' disease 03/22/2015  . OAB (overactive bladder) 12/16/2013  . GERD (gastroesophageal reflux disease) 12/13/2012  . Osteoarthritis of [place] joint of thumb 08/13/2012  . Seasonal allergies 08/13/2012  . Urge and stress incontinence 08/13/2012  . Osteopenia 08/05/2012  . DDD (degenerative disc disease), lumbosacral 10/22/2010   Lyndee Hensen, PT, DPT 9:58 AM  07/28/20    [facility] [place], [place], [address] Phone: [phone]   Fax:  [phone]  Name: Lavonia San Gilmore MRN: [id number] Date of Birth: [birth year]   PHYSICAL THERAPY DISCHARGE SUMMARY  Visits from Start of Care: 13 Plan: Patient agrees to discharge.  Patient goals were partially met. Patient is being discharged due to                                                     ?????    Back pain doing well. Pt referred back to MD for neck pain/ testing .   Lyndee Hensen, PT, DPT 10:05  AM  09/10/20

## 2020-07-28 NOTE — Patient Instructions (Signed)
Access Code: [email or web address] URL: [email or web address] Date: 07/28/2020 Prepared by: Lyndee Hensen  Exercises Supine Posterior Pelvic Tilt - 2 x daily - 1-2 sets - 10 reps Supine Single Knee to Chest Stretch - 2 x daily - 3 reps - 30 hold Supine Lower Trunk Rotation - 2 x daily - 10 reps - 5 hold Supine Piriformis Stretch Pulling Heel to Hip - 2 x daily - 3 reps - 30 hold Standing Sidebending with Chair Support - 2 x daily - 3 reps - 30 hold Sidelying Hip Abduction - 1 x daily - 1-2 sets - 10 reps Hooklying Isometric Clamshell - 1 x daily - 2 sets - 10 reps Supine Bridge with Gluteal Set and Spinal Articulation - 2 x daily - 2 sets - 10 reps Standing Row with Anchored Resistance - 1 x daily - 2 sets - 10 reps

## 2020-07-28 NOTE — Therapy (Signed)
[facility] [address] [place], [place], [address] Phone: [phone]   Fax:  [phone]  Physical Therapy Treatment  Patient Details  Name: Martha Gilmore MRN: [id number] Date of Birth: [birth year] Referring Provider (PT): Billey Chang   Encounter Date: 07/26/2020   PT End of Session - 07/28/20 0817    Visit Number 12    Number of Visits 16    Date for PT Re-Evaluation 08/23/20    Authorization Type Medicare   Re-eval done at visit 8;    PT Start Time 0845    PT Stop Time 0929    PT Time Calculation (min) 44 min    Activity Tolerance Patient tolerated treatment well    Behavior During Therapy [facility] for tasks assessed/performed           Past Medical History:  Diagnosis Date  . Cataracts, bilateral 09/23/2015  . Clostridium difficile infection 04/16/2017   H/o C.diff due to clindamicyin 09/2016; two rounds of treatment for cure. Dr. Benson Norway  . Depression   . Hyperlipidemia   . Hypertension   . OAB (overactive bladder) 12/16/2013  . Osteopenia 08/05/2012   Dexa scan 07/2012 mild osteopenia, T = -1.1  . Seasonal allergies   . Urge and stress incontinence     Past Surgical History:  Procedure Laterality Date  . APPENDECTOMY  1971  . CESAREAN SECTION    . CHOLECYSTECTOMY    . Teeth Implants    . TRIGGER FINGER RELEASE    . VAGINAL HYSTERECTOMY      There were no vitals filed for this visit.   Subjective Assessment - 07/28/20 0816    Subjective Pt states slight improvement of pain. Slight improved ROM to L, with some less pain. R low back still sore at times.    Currently in Pain? Yes    Pain Score 4    Pain Location Neck    Pain Orientation Left    Pain Descriptors / Indicators Aching    Pain Type Chronic pain    Pain Onset More than a month ago    Pain Frequency Intermittent                             [facility] Adult PT Treatment/Exercise - 07/28/20 0001      Lumbar Exercises: Stretches    Single Knee to Chest Stretch 2 reps;30 seconds    Piriformis Stretch 2 reps;30 seconds      Lumbar Exercises: Standing   Row 20 reps    Theraband Level (Row) Level 3 (Green)    Other Standing Lumbar Exercises Hip Abd x10 bil;    Other Standing Lumbar Exercises Bwd stepping on R with glute activation x10      Lumbar Exercises: Supine   Bridge 20 reps      Modalities   Modalities Traction      Traction   Type of Traction Cervical    Min (lbs) 12    Max (lbs) 14    Time 5 min x 2= 10 min;      Manual Therapy   Manual therapy comments skilled palpation and moitoring of soft tissue with dry needling.    Joint Mobilization Cervical PAs    Soft tissue mobilization STm bil cervical paraspinals,    Manual Traction Long leg distraction on R x 2 min for lumbar pump            Trigger Point Dry  Needling - 07/28/20 0001    Consent Given? Yes    Education Handout Provided Previously provided    Cervical multifidi Response Palpable increased muscle length                  PT Short Term Goals - 07/18/20 2050      PT SHORT TERM GOAL #1   Title Pt to be independent with initial HEP (for back)    Time 2    Period Weeks    Status Achieved    Target Date 06/28/20      PT SHORT TERM GOAL #2   Title Pt to be independent with initial HEP for neck    Time 2    Period Weeks    Status New    Target Date 07/26/20             PT Long Term Goals - 07/18/20 2053      PT LONG TERM GOAL #1   Title Pt to be independent with final HEP    Time 6    Period Weeks    Status Partially Met    Target Date 08/23/20      PT LONG TERM GOAL #2   Title Pt to report decreased pain in R lumbar region to 0-2/10 with IADLS.    Time 6    Period Weeks    Status Partially Met    Target Date 08/23/20      PT LONG TERM GOAL #3   Title Pt to demo optimal bend, squat, lift mechanics for improved back pain and ability for IADLS.    Time 6    Period Weeks    Status Partially Met    Target  Date 08/23/20      PT LONG TERM GOAL #4   Title Pt to demo ability for cervical ROM to be [facility] with pain 0-1/10.    Time 6    Period Weeks    Status New    Target Date 08/23/20                 Plan - 07/28/20 0819    Clinical Impression Statement Pt with mild pain improvements since last visit, with addition of traction. Will continue traction for a few visits in attempts for decreased pain. Pt continues to have mild pain in R glute, depending on activity. Has been diligent with HEP for this. Pt to benefit from continued care.    Examination-Activity Limitations Bend;Transfers;Squat;Stand;Lift;Locomotion Level    Examination-Participation Restrictions Cleaning;Yard Work;Community Activity;Shop    Stability/Clinical Decision Making Stable/Uncomplicated    Rehab Potential Good    PT Frequency 2x / week    PT Duration 6 weeks    PT Treatment/Interventions ADLs/Self Care Home Management;Cryotherapy;Electrical Stimulation;DME Instruction;Ultrasound;Traction;Moist Heat;Iontophoresis 4mg /ml Dexamethasone;Gait training;Stair training;Functional mobility training;Therapeutic activities;Therapeutic exercise;Balance training;Patient/family education;Neuromuscular re-education;Manual techniques;Passive range of motion;Taping;Dry needling;Spinal Manipulations;Joint Manipulations    PT Home Exercise Plan Access Code [email or web address]    Consulted and Agree with Plan of Care Patient           Patient will benefit from skilled therapeutic intervention in order to improve the following deficits and impairments:  Pain,Improper body mechanics,Increased muscle spasms,Decreased mobility,Decreased activity tolerance,Decreased endurance,Decreased range of motion,Decreased strength,Impaired flexibility,Difficulty walking,Decreased balance  Visit Diagnosis: Chronic bilateral low back pain without sciatica  Cervicalgia     Problem List Patient Active Problem List   Diagnosis Date Noted  . Facet  degeneration of lumbar region 01/06/2020  . Chondromalacia, knee, right 11/05/2017  .  Osteoarthrosis, localized, primary, knee, right 11/05/2017  . Major depression, chronic 04/16/2017  . Mixed hyperlipidemia 04/16/2017  . Clostridium difficile infection 04/16/2017  . Family history of early CAD 04/16/2017  . Bilateral sensorineural hearing loss 02/06/2016  . Postablative hypothyroidism 12/20/2015  . History of Graves' disease 03/22/2015  . OAB (overactive bladder) 12/16/2013  . GERD (gastroesophageal reflux disease) 12/13/2012  . Osteoarthritis of [place] joint of thumb 08/13/2012  . Seasonal allergies 08/13/2012  . Urge and stress incontinence 08/13/2012  . Osteopenia 08/05/2012  . DDD (degenerative disc disease), lumbosacral 10/22/2010    Lyndee Hensen, PT, DPT 8:26 AM  07/28/20    [facility] [place], [place], [address] Phone: [phone]   Fax:  [phone]  Name: Martha Gilmore MRN: [id number] Date of Birth: 1940-11-12

## 2020-07-29 ENCOUNTER — Encounter: Payer: Self-pay | Admitting: Family Medicine

## 2020-07-29 ENCOUNTER — Ambulatory Visit (INDEPENDENT_AMBULATORY_CARE_PROVIDER_SITE_OTHER): Payer: Medicare Other | Admitting: Family Medicine

## 2020-07-29 ENCOUNTER — Telehealth: Payer: Self-pay | Admitting: Family Medicine

## 2020-07-29 VITALS — BP 138/80 | HR 59 | Ht 60.0 in | Wt 170.0 lb

## 2020-07-29 DIAGNOSIS — M542 Cervicalgia: Secondary | ICD-10-CM | POA: Diagnosis not present

## 2020-07-29 NOTE — Progress Notes (Signed)
   I, Wendy Poet, LAT, ATC, am serving as scribe for Dr. [name].  Martha Gilmore is a 80 y.o. female who presents to [place] at [facility] today for f/u neck pain. Pt was last seen by Dr. [name] on 07/08/20 and was advised use TENS unit, heating pad, and was referred to PT 6 visits. Today, pt reports that her neck pain is about the same, perhaps slightly better.  She has had 2 sessions of dry needling at PT.  She denies any radiating pain into her arms but does not radiating pain into her subocciptal region.  L side is worse than R.  Dx imaging: 07/08/20 C-spine XR  Pertinent review of systems: No fevers or chills  Relevant historical information: Graves' disease treated with radioactive iodine   Exam:  BP 138/80 (BP Location: Right Arm, Patient Position: Sitting, Cuff Size: Normal)   Pulse (!) 59   Ht 5' (1.524 m)   Wt 170 lb (77.1 kg)   SpO2 97%   BMI 33.20 kg/m  General: Well Developed, well nourished, and in no acute distress.   MSK: C-spine normal-appearing nontender midline.  Significantly decreased cervical motion pain especially generated with left cervical rotation.    Lab and Radiology Results  EXAM: CERVICAL SPINE - 2-3 VIEW  COMPARISON:  None.  FINDINGS: There are 6 cervical type vertebral bodies visualized on the lateral view. There is no evidence of cervical spine fracture or prevertebral soft tissue swelling. Preserved cervical spine lordosis. Multilevel degenerative changes cervical spine with severe lower cervical disc space narrowing, facet and uncovertebral hypertrophy with grade 1 degenerative C3 on C4 anterolisthesis. Dental hardware.  IMPRESSION: No acute osseous abnormality. Multilevel cervical spine spondylosis, most prominent in the lower cervical spine.   Electronically Signed   By: Dahlia Bailiff MD   On: 07/09/2020 13:26  I, [name], personally (independently) visualized and performed the interpretation  of the images attached in this note.     Assessment and Plan: 80 y.o. female with left sided neck pain not improving with physical therapy.  At this point pain is chronic and severe not responding to conservative management.  Plan for MRI for potential facet injection planning.  Discussed with patient that if the facet joints of main concern are very high such as C1 or C2 injection may not be safe or possible however lower down injection is more amenable.  Recheck following MRI.   PDMP not reviewed this encounter. Orders Placed This Encounter  Procedures  . MR CERVICAL SPINE WO CONTRAST    Left superior neck pain. Facet injection planning    Standing Status:   Future    Standing Expiration Date:   07/29/2021    Order Specific Question:   What is the patient's sedation requirement?    Answer:   No Sedation    Order Specific Question:   Does the patient have a pacemaker or implanted devices?    Answer:   No    Order Specific Question:   Preferred imaging location?    Answer:   [facility] (table limit-550lbs)   No orders of the defined types were placed in this encounter.    Discussed warning signs or symptoms. Please see discharge instructions. Patient expresses understanding.   The above documentation has been reviewed and is accurate and complete [name], M.D.

## 2020-07-29 NOTE — Patient Instructions (Signed)
Thank you for coming in today.   You should hear from MRI scheduling within 1 week. If you do not hear please let me know.    Recheck following the MRI.  

## 2020-07-29 NOTE — Chronic Care Management (AMB) (Signed)
  Chronic Care Management   Note  07/29/2020 Name: Meggan [place]ino MRN: [id number] DOB: December 11, 1940  Stacyann [place]ino is a 79 y.o. year old female who is a primary care patient of Leamon Arnt, MD. I reached out to Alecia [place]ino by phone today in response to a referral sent by Ms. Lucienne Capers PCP, Leamon Arnt, MD.   Ms. [place] was given information about Chronic Care Management services today including:  1. CCM service includes personalized support from designated clinical staff supervised by her physician, including individualized plan of care and coordination with other care providers 2. 24/7 contact phone numbers for assistance for urgent and routine care needs. 3. Service will only be billed when office clinical staff spend 20 minutes or more in a month to coordinate care. 4. Only one practitioner may furnish and bill the service in a calendar month. 5. The patient may stop CCM services at any time (effective at the end of the month) by phone call to the office staff.   Patient agreed to services and verbal consent obtained.   Follow up plan:   Lauretta Grill [facility] Scheduler

## 2020-08-03 ENCOUNTER — Encounter: Payer: Medicare Other | Admitting: Physical Therapy

## 2020-08-04 DIAGNOSIS — M9901 Segmental and somatic dysfunction of cervical region: Secondary | ICD-10-CM | POA: Diagnosis not present

## 2020-08-04 DIAGNOSIS — M5032 Other cervical disc degeneration, mid-cervical region, unspecified level: Secondary | ICD-10-CM | POA: Diagnosis not present

## 2020-08-04 DIAGNOSIS — M5136 Other intervertebral disc degeneration, lumbar region: Secondary | ICD-10-CM | POA: Diagnosis not present

## 2020-08-04 DIAGNOSIS — S29012A Strain of muscle and tendon of back wall of thorax, initial encounter: Secondary | ICD-10-CM | POA: Diagnosis not present

## 2020-08-04 DIAGNOSIS — M9903 Segmental and somatic dysfunction of lumbar region: Secondary | ICD-10-CM | POA: Diagnosis not present

## 2020-08-04 DIAGNOSIS — M9902 Segmental and somatic dysfunction of thoracic region: Secondary | ICD-10-CM | POA: Diagnosis not present

## 2020-08-05 ENCOUNTER — Encounter: Payer: Medicare Other | Admitting: Physical Therapy

## 2020-08-12 ENCOUNTER — Telehealth: Payer: Self-pay

## 2020-08-12 NOTE — Progress Notes (Signed)
Chronic Care Management Pharmacy Note  08/17/2020 Name:  Martha Gilmore MRN:  [id number] DOB:  Sep 28, 1940  Subjective: Martha Gilmore is an 80 y.o. year old female who is a primary patient of Leamon Arnt, MD.  The CCM team was consulted for assistance with disease management and care coordination needs.    Engaged with patient by telephone for initial visit in response to provider referral for pharmacy case management and/or care coordination services.   Consent to Services:  The patient was given the following information about Chronic Care Management services today, agreed to services, and gave verbal consent: 1. CCM service includes personalized support from designated clinical staff supervised by the primary care provider, including individualized plan of care and coordination with other care providers 2. 24/7 contact phone numbers for assistance for urgent and routine care needs. 3. Service will only be billed when office clinical staff spend 20 minutes or more in a month to coordinate care. 4. Only one practitioner may furnish and bill the service in a calendar month. 5.The patient may stop CCM services at any time (effective at the end of the month) by phone call to the office staff. 6. The patient will be responsible for cost sharing (co-pay) of up to 20% of the service fee (after annual deductible is met). Patient agreed to services and consent obtained.  Patient Care Team: Leamon Arnt, MD as PCP - General (Family Medicine) Susette Racer, MD as Consulting Physician (Endocrinology) Carol Ada, MD as Consulting Physician (Gastroenterology) Roseanne Kaufman, MD as Consulting Physician (Orthopedic Surgery) Rolm Bookbinder, MD as Consulting Physician (Dermatology) Stephannie Li, OD as Consulting Physician (Ophthalmology) P[facility], [facility] as Consulting Physician (Audiology) Madelin Rear, [facility] as Pharmacist (Pharmacist)  Recent office visits:  04/29/20-  Billey Chang, MD- wellness and chronic disease management visit, imaging ordered, follow up 12 months  Recent consult visits:  07/29/20 [name], MD(Sports Medicine)- mri scheduled for unimproved neck pain, no medication changes 07/08/20-  [name], MD(Sports Medicine)- neck pain, physical therapy planned, follow up 6 weeks  Objective:  Lab Results  Component Value Date   CREATININE 0.94 04/29/2020   CREATININE 0.91 04/29/2019   CREATININE 0.94 03/26/2018    No results found for: HGBA1C Last diabetic Eye exam: No results found for: HMDIABEYEEXA  Last diabetic Foot exam: No results found for: HMDIABFOOTEX      Component Value Date/Time   CHOL 186 04/29/2020 0905   TRIG 86.0 04/29/2020 0905   HDL 58.50 04/29/2020 0905   CHOLHDL 3 04/29/2020 0905   VLDL 17.2 04/29/2020 0905   LDLCALC 110 (H) 04/29/2020 0905    Hepatic Function Latest Ref Rng & Units 04/29/2020 04/29/2019 03/26/2018  Total Protein 6.0 - 8.3 g/dL 6.9 6.2 7.1  Albumin 3.5 - 5.2 g/dL 4.3 4.0 4.2  AST 0 - 37 U/L $Remo'18 19 21  'oUzYI$ ALT 0 - 35 U/L $Remo'12 14 15  'tYGqw$ Alk Phosphatase 39 - 117 U/L 88 99 103  Total Bilirubin 0.2 - 1.2 mg/dL 0.7 0.6 0.6    Lab Results  Component Value Date/Time   TSH 0.70 04/29/2020 09:05 AM   TSH 2.12 04/29/2019 03:39 PM    CBC Latest Ref Rng & Units 04/29/2020 04/29/2019 03/26/2018  WBC 4.0 - 10.5 K/uL 5.2 7.7 6.6  Hemoglobin 12.0 - 15.0 g/dL 13.6 13.0 14.1  Hematocrit 36.0 - 46.0 % 40.1 39.0 41.8  Platelets 150.0 - 400.0 K/uL 197.0 213.0 253.0    No results found for: VD25OH  Clinical ASCVD: No  The 10-year ASCVD risk score Mikey Bussing DC Jr., et al., 2013) is: 35.6%   Values used to calculate the score:     Age: 18 years     Sex: Female     Is Non-Hispanic African American: No     Diabetic: No     Tobacco smoker: No     Systolic Blood Pressure: 967 mmHg     Is BP treated: Yes     HDL Cholesterol: 58.5 mg/dL     Total Cholesterol: 186 mg/dL    Other: (CHADS2VASc if Afib, PHQ9 if depression,  MMRC or CAT for COPD, ACT, DEXA)  Social History   Tobacco Use  Smoking Status Never Smoker  Smokeless Tobacco Never Used   BP Readings from Last 3 Encounters:  07/29/20 138/80  07/08/20 (!) 155/80  04/29/20 122/70   Pulse Readings from Last 3 Encounters:  07/29/20 (!) 59  07/08/20 60  04/29/20 68   Wt Readings from Last 3 Encounters:  07/29/20 170 lb (77.1 kg)  07/08/20 172 lb 9.6 oz (78.3 kg)  04/29/20 171 lb 3.2 oz (77.7 kg)    Assessment: Review of patient past medical history, allergies, medications, health status, including review of consultants reports, laboratory and other test data, was performed as part of comprehensive evaluation and provision of chronic care management services.   SDOH:  (Social Determinants of Health) assessments and interventions performed: Yes.  CCM Care Plan  Allergies  Allergen Reactions  . Aspirin Itching  . Meperidine Other (See Comments)    hallucinations Other Reaction: HALLUCINATIONS hallucinations   . Penicillins Other (See Comments) and Rash    Other Reaction: RASH & HIVES   . Statins Itching  . Cephalexin Other (See Comments) and Rash    Other Reaction: RASH & ITCHING   . Clindamycin/Lincomycin Other (See Comments) and Rash    Other Reaction: RASH & ITCHING   . Demerol  [Meperidine Hcl] Other (See Comments)    hallucinations  . Tape Rash    Medications Reviewed Today    Reviewed by Madelin Rear, [facility] (Pharmacist) on 08/16/20 at [phone]  Med List Status: <None>  Medication Order Taking? Sig Documenting Provider Last Dose Status Informant  atorvastatin (LIPITOR) 10 MG tablet [id number] Yes TAKE 1 TABLET BY MOUTH EVERYDAY AT BEDTIME Leamon Arnt, MD Taking Active   citalopram (CELEXA) 20 MG tablet [id number] Yes TAKE 1 TABLET BY MOUTH EVERY DAY Leamon Arnt, MD Taking Active   levothyroxine (SYNTHROID) 100 MCG tablet [id number] Yes Take 1 tablet (100 mcg total) by mouth daily. Leamon Arnt, MD Taking Active   Multiple  Vitamins-Minerals (PRESERVISION AREDS PO) [id number] Yes Take by mouth. [provider] Taking Active   omeprazole (PRILOSEC) 20 MG capsule [id number] Yes TAKE 1 CAPSULE BY MOUTH EVERY DAY Leamon Arnt, MD Taking Active   oxybutynin (DITROPAN-XL) 10 MG 24 hr tablet [id number] Yes TAKE ONE TABLET BY MOUTH DAILY AT BEDTIME Leamon Arnt, MD Taking Active   Polyethyl Glycol-Propyl Glycol (SYSTANE OP) [id number]  Apply 1 drop to eye as needed. [provider]  Active           Patient Active Problem List   Diagnosis Date Noted  . Facet degeneration of lumbar region 01/06/2020  . Chondromalacia, knee, right 11/05/2017  . Osteoarthrosis, localized, primary, knee, right 11/05/2017  . Major depression, chronic 04/16/2017  . Mixed hyperlipidemia 04/16/2017  . Clostridium difficile infection 04/16/2017  . Family history of early CAD 04/16/2017  .  Bilateral sensorineural hearing loss 02/06/2016  . Postablative hypothyroidism 12/20/2015  . History of Graves' disease 03/22/2015  . OAB (overactive bladder) 12/16/2013  . GERD (gastroesophageal reflux disease) 12/13/2012  . Osteoarthritis of [place] joint of thumb 08/13/2012  . Seasonal allergies 08/13/2012  . Urge and stress incontinence 08/13/2012  . Osteopenia 08/05/2012  . DDD (degenerative disc disease), lumbosacral 10/22/2010    Immunization History  Administered Date(s) Administered  . Influenza Split 01/15/2012  . Influenza, High Dose Seasonal PF 01/30/2013, 01/05/2015, 02/17/2016, 02/02/2017, 01/18/2018, 12/11/2018, 12/23/2019  . Influenza, Seasonal, Injecte, Preservative Fre 01/21/2014  . Influenza-Unspecified 01/09/2011, 01/15/2012, 01/21/2014, 01/22/2018  . PFIZER(Purple Top)SARS-COV-2 Vaccination 05/15/2019, 06/05/2019, 01/20/2020  . Pneumococcal Conjugate-13 01/05/2015  . Pneumococcal Polysaccharide-23 04/24/2006, 11/18/2007, 05/30/2012  . Tdap 08/17/2010  . Zoster 04/25/1995  . Zoster Recombinat (Shingrix)  06/29/2018, 11/01/2018    Conditions to be addressed/monitored: Osteopenia, OA, GERD, MDD, Graves' Disease, OAB, HLD, family Hx of early CAD  Care Plan : [place]  Updates made by Madelin Rear, [facility] since 08/17/2020 12:00 AM    Problem: Osteopenia, OA, GERD, MDD, Graves' Disease, OAB, HLD, family Hx of early CAD   Priority: High    Long-Range Goal: Disease Management   Start Date: 08/17/2020  Expected End Date: 08/17/2021  This Visit's Progress: On track  Priority: High  Note:    Current Barriers:  . Potential opportunity to reduce number of medications w/ PPI taper, OAB alternative due to anticholinergic properties and dry mouth symptoms  Pharmacist Clinical Goal(s):  Marland Kitchen Patient will contact provider office for questions/concerns as evidenced notation of same in electronic health record through collaboration with PharmD and provider.   Interventions: . 1:1 collaboration with Leamon Arnt, MD regarding development and update of comprehensive plan of care as evidenced by provider attestation and co-signature . Inter-disciplinary care team collaboration (see longitudinal plan of care) . Comprehensive medication review performed; medication list updated in electronic medical record  Hyperlipidemia: (LDL goal < 100) -Not ideally controlled -Current treatment: . Atorvastatin 10 mg once daily -Tolerating without issue -Current dietary patterns: on noom program - feels is more mindful of diet now. Doesn't add a lot of salt. -Current exercise habits: nothing formal -Educated on Cholesterol goals;  Benefits of statin for ASCVD risk reduction; -Recommended to continue current medication  GERD (Goal: ensure necessary treatment) -Controlled -Understands triggers to be spicy, tomato, carbonated beverages. Feels reflux has been very well controlled. Is open to trying taper but wants to hold off until back from vacation and revisit early June  -Current treatment   . Omeprazole 20 mg once daily -Potential triggers reviewed -Open to trying PPI taper - will discuss w/ pcp as appropriate 09/2020. -Recommended to continue current medication unless directed otherwise  OAB (Goal: minimize pain, ensure medication safety) -Controlled  -Mentions ongoing dry mouth symptoms -Current treatment  . Oxybutynin XL 10 mg once daily  -Medications previously tried: n/a  -Assessed potential side effects - wants to discuss alternative agents June 2022  Patient Goals/Self-Care Activities . Patient will:  - target a minimum of 150 minutes of moderate intensity exercise weekly  Follow Up Plan: RPh f/u telephone call 09/2020. Medication Assistance: None required.  Patient affirms current coverage meets needs.    Patient's preferred pharmacy is:  [id number] [address], [place] [address] Phone: [phone] Fax: [address], [place]   [address] Phone: [phone] Fax: [phone]  Future Appointments  Date Time Provider Day  08/17/2020 11:00 AM [facility] DX DEXA 1 [facility] CE  08/17/2020  1:30 PM [facility] MR 2 [facility]MRI [facility]  09/22/2020  9:00 AM [facility] CCM PHARMACIST [facility] PEC  05/02/2021  8:00 AM Leamon Arnt, MD [facility]EC  06/09/2021  8:45 AM [facility] HEALTH COACH [facility] P[facility]   Follow Up:  Patient agrees to Care Plan and Follow-up.  Madelin Rear, Pharm.D., BCGP Clinical Pharmacist [place] [phone]

## 2020-08-12 NOTE — Chronic Care Management (AMB) (Signed)
Chronic Care Management Pharmacy Assistant   Name: Martha Gilmore  MRN: [id number] DOB: 1941-01-30  Reason for Encounter: Chart Prep   Recent office visits:  04/29/20- Martha Chang, MD- female wellness and chronic disease management visit, imaging ordered, follow up 12 months  Recent consult visits:  07/29/20 Martha Leader, MD(Sports Medicine)- mri scheduled for unimproved neck pain, no medication changes 07/08/20-  Martha Leader, MD(Sports Medicine)- neck pain, physical therapy planned, follow up 6 weeks  Hospital visits:  None in previous 6 months  Medications: Outpatient Encounter Medications as of 08/12/2020  Medication Sig  . atorvastatin (LIPITOR) 10 MG tablet TAKE 1 TABLET BY MOUTH EVERYDAY AT BEDTIME  . citalopram (CELEXA) 20 MG tablet TAKE 1 TABLET BY MOUTH EVERY DAY  . levothyroxine (SYNTHROID) 100 MCG tablet Take 1 tablet (100 mcg total) by mouth daily.  . Multiple Vitamins-Minerals (PRESERVISION AREDS PO) Take by mouth.  Marland Kitchen omeprazole (PRILOSEC) 20 MG capsule TAKE 1 CAPSULE BY MOUTH EVERY DAY  . oxybutynin (DITROPAN-XL) 10 MG 24 hr tablet TAKE ONE TABLET BY MOUTH DAILY AT BEDTIME  . Polyethyl Glycol-Propyl Glycol (SYSTANE OP) Apply 1 drop to eye as needed.   No facility-administered encounter medications on file as of 08/12/2020.   Current Documented Medications Atorvastatin 10 mg- 90 DS last filled 07/23/20 Citalopram 20 mg - 90 DS last filled 07/23/20 Levothyroxine 100 MCG- 90 DS last filled 07/27/20 Multiple Vitamins- Minerals Omeprazole 20 mg- 90 DS last filled 06/14/20 Oxybutynin 10 mg- 90 DS last filled 09/18/19 [facility]  Polyethyl Glycol-Propyl Glycol  [facility] Patient reported taking tylenol arthritis for neck pain and DE3 for dry eyes  Have you seen any other providers since your last visit?  Patient stated she has only has seen her sport medicine provider  Any changes in your medications or health? Patient stated there were no  new changes  Any side effects from any medications?  Patient stated there are currently no side effects  Do you have an symptoms or problems not managed by your medications?  Patient stated her only problem not fully managed is her neck pain. She is scheduled for an mri so she understands that this will help her provider find the right regimen for her problems  Any concerns about your health right now?  Patient has no current concerns  Has your provider asked that you check blood pressure, blood sugar, or follow special diet at home?  Patient stated she was asked to follow a special diet and lose weight, but she does not adhere as much as she would like. She recently signed up for a weight loss program, [place].   Do you get any type of exercise on a regular basis? Patient stated she gets a good amount of activity but she does not exercise persay  Can you think of a goal you would like to reach for your health?  Patient did not have any goals for her health   Do you have any problems getting your medications?  Patient currently does not have any issues receiving medications  Is there anything that you would like to discuss during the appointment?  Patient did not have anything to discuss  Ms Martha Gilmore was on vacation at a cabin in [place] and is just returning today. She spent a week with her family just to relax and plans on taking another vacation in two weeks for her grand nieces graduation. She enjoys traveling since Covid-19 prevented her from doing her normal  activities. 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

## 2020-08-13 ENCOUNTER — Encounter: Payer: Self-pay | Admitting: Family Medicine

## 2020-08-16 ENCOUNTER — Encounter: Payer: Medicare Other | Admitting: Physical Therapy

## 2020-08-16 ENCOUNTER — Ambulatory Visit (INDEPENDENT_AMBULATORY_CARE_PROVIDER_SITE_OTHER): Payer: Medicare Other

## 2020-08-16 DIAGNOSIS — E782 Mixed hyperlipidemia: Secondary | ICD-10-CM

## 2020-08-16 DIAGNOSIS — K219 Gastro-esophageal reflux disease without esophagitis: Secondary | ICD-10-CM

## 2020-08-16 DIAGNOSIS — Z23 Encounter for immunization: Secondary | ICD-10-CM | POA: Diagnosis not present

## 2020-08-16 DIAGNOSIS — N3281 Overactive bladder: Secondary | ICD-10-CM

## 2020-08-17 ENCOUNTER — Ambulatory Visit
Admission: RE | Admit: 2020-08-17 | Discharge: 2020-08-17 | Disposition: A | Discharge status: Home / Self Care | Payer: Medicare Other | Source: Ambulatory Visit | Attending: Family Medicine | Admitting: Family Medicine

## 2020-08-17 ENCOUNTER — Other Ambulatory Visit: Payer: Self-pay

## 2020-08-17 DIAGNOSIS — Z780 Asymptomatic menopausal state: Secondary | ICD-10-CM | POA: Diagnosis not present

## 2020-08-17 DIAGNOSIS — M542 Cervicalgia: Secondary | ICD-10-CM

## 2020-08-17 DIAGNOSIS — M8589 Other specified disorders of bone density and structure, multiple sites: Secondary | ICD-10-CM | POA: Diagnosis not present

## 2020-08-17 DIAGNOSIS — M8580 Other specified disorders of bone density and structure, unspecified site: Secondary | ICD-10-CM

## 2020-08-17 DIAGNOSIS — M4802 Spinal stenosis, cervical region: Secondary | ICD-10-CM | POA: Diagnosis not present

## 2020-08-17 NOTE — Patient Instructions (Signed)
Ms. [place] Marino,  Thank you for talking with me today. I have included our care plan/goals in the following pages.   Please review and call me at [phone] with any questions.  Thanks! Ellin Mayhew, Pharm.D., BCGP Clinical Pharmacist [facility] [phone]  Patient Care Plan: [place] Plan    Problem Identified: Osteopenia, OA, GERD, MDD, Graves' Disease, OAB, HLD, family Hx of early CAD   Priority: High    Long-Range Goal: Disease Management   Start Date: 08/17/2020  Expected End Date: 08/17/2021  This Visit's Progress: On track  Priority: High  Note:    Current Barriers:  . Potential opportunity to reduce number of medications w/ PPI taper, OAB alternative due to anticholinergic properties and dry mouth symptoms  Pharmacist Clinical Goal(s):  Marland Kitchen Patient will contact provider office for questions/concerns as evidenced notation of same in electronic health record through collaboration with PharmD and provider.   Interventions: . 1:1 collaboration with Leamon Arnt, MD regarding development and update of comprehensive plan of care as evidenced by provider attestation and co-signature . Inter-disciplinary care team collaboration (see longitudinal plan of care) . Comprehensive medication review performed; medication list updated in electronic medical record  Hyperlipidemia: (LDL goal < 100) -Not ideally controlled -Current treatment: . Atorvastatin 10 mg once daily -Tolerating without issue -Current dietary patterns: on noom program - feels is more mindful of diet now. Doesn't add a lot of salt. -Current exercise habits: nothing formal -Educated on Cholesterol goals;  Benefits of statin for ASCVD risk reduction; -Recommended to continue current medication  GERD (Goal: ensure necessary treatment) -Controlled -Understands triggers to be spicy, tomato, carbonated beverages. Feels reflux has been very well controlled. Is open  to trying taper but wants to hold off until back from vacation and revisit early June  -Current treatment  . Omeprazole 20 mg once daily -Potential triggers reviewed -Open to trying PPI taper - will discuss w/ pcp as appropriate 09/2020. -Recommended to continue current medication unless directed otherwise  OAB (Goal: minimize pain, ensure medication safety) -Controlled  -Mentions ongoing dry mouth symptoms -Current treatment  . Oxybutynin XL 10 mg once daily  -Medications previously tried: n/a  -Assessed potential side effects - wants to discuss alternative agents June 2022  Patient Goals/Self-Care Activities . Patient will:  - target a minimum of 150 minutes of moderate intensity exercise weekly  Follow Up Plan: RPh f/u telephone call 09/2020. Medication Assistance: None required.  Patient affirms current coverage meets needs.     The patient was given the following information about Chronic Care Management services today, agreed to services, and gave verbal consent: 1. CCM service includes personalized support from designated clinical staff supervised by the primary care provider, including individualized plan of care and coordination with other care providers 2. 24/7 contact phone numbers for assistance for urgent and routine care needs. 3. Service will only be billed when office clinical staff spend 20 minutes or more in a month to coordinate care. 4. Only one practitioner may furnish and bill the service in a calendar month. 5.The patient may stop CCM services at any time (effective at the end of the month) by phone call to the office staff. 6. The patient will be responsible for cost sharing (co-pay) of up to 20% of the service fee (after annual deductible is met). Patient agreed to services and consent obtained.  The patient verbalized understanding of instructions provided today and agreed to receive  a MyChart copy of patient instruction and/or educational materials. Telephone follow  up appointment with pharmacy team member scheduled for: See next appointment with "Care Management Staff" under "What's Next" below.

## 2020-08-18 ENCOUNTER — Encounter: Payer: Medicare Other | Admitting: Physical Therapy

## 2020-08-18 NOTE — Progress Notes (Signed)
MRI cervical spine shows advanced facet arthritis at left C1-C2.  This is going to be very hard to treat.  You do have some significant arthritis much lower down and right-sided C7-T1 which is easier to treat.   There is some concern for inflammatory arthritis in this MRI. Please schedule follow-up appointment with me.  We need to do some labs and talk about what we can do to better control your pain.

## 2020-08-19 DIAGNOSIS — S29012A Strain of muscle and tendon of back wall of thorax, initial encounter: Secondary | ICD-10-CM | POA: Diagnosis not present

## 2020-08-19 DIAGNOSIS — M9902 Segmental and somatic dysfunction of thoracic region: Secondary | ICD-10-CM | POA: Diagnosis not present

## 2020-08-19 DIAGNOSIS — M9903 Segmental and somatic dysfunction of lumbar region: Secondary | ICD-10-CM | POA: Diagnosis not present

## 2020-08-19 DIAGNOSIS — M5032 Other cervical disc degeneration, mid-cervical region, unspecified level: Secondary | ICD-10-CM | POA: Diagnosis not present

## 2020-08-19 DIAGNOSIS — M5136 Other intervertebral disc degeneration, lumbar region: Secondary | ICD-10-CM | POA: Diagnosis not present

## 2020-08-19 DIAGNOSIS — M9901 Segmental and somatic dysfunction of cervical region: Secondary | ICD-10-CM | POA: Diagnosis not present

## 2020-09-22 ENCOUNTER — Telehealth: Payer: Medicare Other

## 2020-09-24 NOTE — Progress Notes (Signed)
I, Peterson Lombard, LAT, ATC acting as a scribe for Martha Leader, MD.  Martha Gilmore is a 80 y.o. female who presents to [place] at [facility] today for f/u neck pain and MRI review. Pt was last seen by Dr. [name] 07/29/20 and was advised to proceed to MRI for potential facet injection planning. Pt failed to improve w/ PT completing a total of 13 vitis. Today, pt reports neck pain is about the same. Pt just returned from a month long trip to [facility] visiting family/friends. Pt locates pain to bilat, L>R-side. Pt has been trying the Tylenol arthritis. Pt c/o pain at the base of her skull, almost feeling like a HA.  Dx imaging: 08/17/20 C-spine MRI  07/08/20 C-spine XR  Pertinent review of systems: No fevers or chills  Relevant historical information: Graves' disease.  History of lumbar facet arthritis managed with facet medial branch block and ablations by Dr. Ernestina Patches   Exam:  BP (!) 138/59 (BP Location: Right Arm, Patient Position: Sitting, Cuff Size: Normal)   Pulse 65   Ht 5' (1.524 m)   Wt 171 lb 6.4 oz (77.7 kg)   SpO2 97%   BMI 33.47 kg/m  General: Well Developed, well nourished, and in no acute distress.   MSK: C-spine decreased cervical motion.    Lab and Radiology Results   EXAM: MRI CERVICAL SPINE WITHOUT CONTRAST  TECHNIQUE: Multiplanar, multisequence MR imaging of the cervical spine was performed. No intravenous contrast was administered.  COMPARISON:  None.  FINDINGS: Alignment: Mild anterolisthesis at C3-4, C5-6, C6-7, and C7-T1.  Vertebrae: Marrow edema involving the left C1-2 facet and dens. No fracture lucency is superimposed across the dens. Marrow edema is seen on both sides of the right C7-T1 facet. No aggressive bone lesion.  Cord: Normal signal and morphology  Posterior Fossa, vertebral arteries, paraspinal tissues: Negative  Disc levels:  C1-2: Retro dental ligamentous thickening with dens edema. No dens cortical  erosion is seen. Facet osteoarthritis with spurring and marrow edema. Spurs encroach on the bilateral C2 nerve roots.  C2-3: Degenerative facet spurring.  Mild left foraminal narrowing  C3-4: Degenerative facet spurring on both sides. Disc narrowing and bulging with shallow central protrusion.  C4-5: Facet osteoarthritis with ankylosis. Disc narrowing with intervertebral ankylosis. No impingement  C5-6: Facet osteoarthritis and disc space narrowing with ankylosis.  C6-7: Facet osteoarthritis and disc space narrowing with ankylosis.  C7-T1:Disc collapse with left preferential bulging. Mild left foraminal stenosis. Mild bilateral foraminal stenosis.  IMPRESSION: 1. Active facet arthritis with marrow edema on the left at C1-2 and right at C7-T1. Contiguous marrow edema in the dens which is likely related to the facet disease given no superimposed erosion. 2. Degeneration with ankylosis from C4-C7, is there are known inflammatory arthritis? 3. Diffusely patent spinal canal.   Electronically Signed   By: Monte Fantasia M.D.   On: 08/17/2020 18:34 I, Martha Gilmore, personally (independently) visualized and performed the interpretation of the images attached in this note.     Assessment and Plan: 80 y.o. female with left superior lateral neck pain chronic.  Failing conservative management.  This pain is thought to be related to the left C1-C2 facet arthritis seen on MRI.  She has failed conservative management with physical therapy.  Ideally would proceed with facet injections and medial branch block and ablations as needed however at this level these injections are technically challenging.  We discussed the case with Dr. Ernestina Patches who has performed similar injections in her  lumbar spine.  Advised her that it may not be possible to proceed with injections based on the level.  Additionally she has considerable spondylosis on MRI and radiology is concerned she may have a previously  undiagnosed spondyloarthropathy.  We will proceed with a rheumatologic work-up listed below.  Lastly discussed pain management options.  We will try Cymbalta..  And taper down Celexa and start Cymbalta 20 mg and recheck in 1 month.   PDMP not reviewed this encounter. Orders Placed This Encounter  Procedures  . Sedimentation rate    Standing Status:   Future    Number of Occurrences:   1    Standing Expiration Date:   09/27/2021  . Cyclic citrul peptide antibody, IgG    Standing Status:   Future    Number of Occurrences:   1    Standing Expiration Date:   09/27/2021  . ANA    Standing Status:   Future    Number of Occurrences:   1    Standing Expiration Date:   09/27/2021  . HLA-B27 antigen    Standing Status:   Future    Number of Occurrences:   1    Standing Expiration Date:   09/27/2021  . Rheumatoid factor    Standing Status:   Future    Number of Occurrences:   1    Standing Expiration Date:   09/27/2021   Meds ordered this encounter  Medications  . DULoxetine (CYMBALTA) 20 MG capsule    Sig: Take 1 capsule (20 mg total) by mouth daily.    Dispense:  30 capsule    Refill:  3     Discussed warning signs or symptoms. Please see discharge instructions. Patient expresses understanding.   The above documentation has been reviewed and is accurate and complete Martha Gilmore, M.D.

## 2020-09-27 ENCOUNTER — Ambulatory Visit (INDEPENDENT_AMBULATORY_CARE_PROVIDER_SITE_OTHER): Payer: Medicare Other | Admitting: Family Medicine

## 2020-09-27 ENCOUNTER — Other Ambulatory Visit: Payer: Self-pay

## 2020-09-27 VITALS — BP 138/59 | HR 65 | Ht 60.0 in | Wt 171.4 lb

## 2020-09-27 DIAGNOSIS — E890 Postprocedural hypothyroidism: Secondary | ICD-10-CM

## 2020-09-27 DIAGNOSIS — M47819 Spondylosis without myelopathy or radiculopathy, site unspecified: Secondary | ICD-10-CM | POA: Diagnosis not present

## 2020-09-27 DIAGNOSIS — M542 Cervicalgia: Secondary | ICD-10-CM | POA: Diagnosis not present

## 2020-09-27 DIAGNOSIS — M2550 Pain in unspecified joint: Secondary | ICD-10-CM | POA: Diagnosis not present

## 2020-09-27 DIAGNOSIS — M45A1 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: Secondary | ICD-10-CM | POA: Diagnosis not present

## 2020-09-27 LAB — SEDIMENTATION RATE: Sed Rate: 22 mm/hr (ref 0–30)

## 2020-09-27 MED ORDER — DULOXETINE HCL 20 MG PO CPEP: 20.0000 mg | ORAL_CAPSULE | Freq: Every day | ORAL | 3 refills | Status: DC

## 2020-09-27 NOTE — Patient Instructions (Signed)
Thank you for coming in today.  Please get labs today before you leave  I will talk with Dr Ernestina Patches about injections.   If he says its not a good idea I will refer you to neuro-surgery for a consultation.   I may also refer you to pain management.   I am starting Cymbalta (Duloxetine).   Take 1/2 of Citalopram tomorrow and Wednesday.  On Thursday start the Duloxetine. Probably take it at night.   Recheck with me in 1 month.

## 2020-09-28 LAB — RHEUMATOID FACTOR: Rheumatoid fact SerPl-aCnc: <14 IU/mL (ref <14)

## 2020-09-28 NOTE — Progress Notes (Signed)
Inflammatory labs so far are negative.  Other labs are still pending

## 2020-09-29 ENCOUNTER — Encounter: Payer: Self-pay | Admitting: Family Medicine

## 2020-09-29 ENCOUNTER — Telehealth: Payer: Self-pay | Admitting: Family Medicine

## 2020-09-29 DIAGNOSIS — M542 Cervicalgia: Secondary | ICD-10-CM

## 2020-09-29 DIAGNOSIS — M47812 Spondylosis without myelopathy or radiculopathy, cervical region: Secondary | ICD-10-CM

## 2020-09-29 MED ORDER — BENZONATATE 100 MG PO CAPS: 100.0000 mg | ORAL_CAPSULE | Freq: Two times a day (BID) | ORAL | 0 refills | Status: DC | PRN

## 2020-09-29 NOTE — Telephone Encounter (Signed)
Spoke w/ pt and relayed Dr. Clovis Riley message. Pt verbalized understanding.

## 2020-09-29 NOTE — Telephone Encounter (Signed)
I spoke with Dr. Ernestina Patches.  He cannot do this procedure safely in his office.  He recommends referral to the neurosurgical office because sometimes they can do procedures like this in the surgical center.  I do think he should have a consultation.  I am referring you to Dr. Lenord Carbo.  He is a doctor who does injections like Dr. Ernestina Patches.  This is a good starting point for your pain and he could refer you to a surgeon if surgery is the best option.

## 2020-09-30 LAB — ANTI-NUCLEAR AB-TITER (ANA TITER)
ANA TITER: 1:80 {titer} — ABNORMAL HIGH
ANA Titer 1: 1:80 {titer} — ABNORMAL HIGH

## 2020-09-30 LAB — ANA: Anti Nuclear Antibody (ANA): POSITIVE — AB

## 2020-09-30 LAB — CYCLIC CITRUL PEPTIDE ANTIBODY, IGG: Cyclic Citrullin Peptide Ab: <16 UNITS

## 2020-09-30 LAB — HLA-B27 ANTIGEN: HLA-B27 Antigen: NEGATIVE

## 2020-09-30 NOTE — Addendum Note (Signed)
Addended by: Gregor Hams on: 09/30/2020 12:32 PM   Modules accepted: Orders

## 2020-09-30 NOTE — Progress Notes (Signed)
Some rheumatologic labs are positive.  Some labs are negative.  I think it is likely that you do not have a rheumatologic problem to explain your neck pain however I am not certain and I do think we should ask a rheumatologist.  I have referred you to rheumatology.  We should hear soon about a referral.  Occasionally the rheumatologist will review the labs and your x-rays and determined that you do not have a rheumatologic problem and save you a visit.  However I think it is likely that you will have a visit.

## 2020-10-04 ENCOUNTER — Encounter: Payer: Self-pay | Admitting: Family Medicine

## 2020-10-05 NOTE — Telephone Encounter (Signed)
Dr. Ernestina Patches cannot perform the injection that I hope that you would get so he recommended that you get a consultation with one of the people at [facility] neurosurgery.  So I would recommend that you proceed to trial of a visit with [place] [facility].

## 2020-10-07 ENCOUNTER — Encounter: Payer: Self-pay | Admitting: Family Medicine

## 2020-10-07 ENCOUNTER — Other Ambulatory Visit: Payer: Self-pay

## 2020-10-07 ENCOUNTER — Ambulatory Visit (INDEPENDENT_AMBULATORY_CARE_PROVIDER_SITE_OTHER): Payer: Medicare Other | Admitting: Family Medicine

## 2020-10-07 VITALS — BP 151/83 | HR 68 | Temp 97.9°F | Ht 60.0 in | Wt 168.6 lb

## 2020-10-07 DIAGNOSIS — B349 Viral infection, unspecified: Secondary | ICD-10-CM | POA: Diagnosis not present

## 2020-10-07 DIAGNOSIS — K219 Gastro-esophageal reflux disease without esophagitis: Secondary | ICD-10-CM | POA: Diagnosis not present

## 2020-10-07 NOTE — Progress Notes (Signed)
Subjective   CC:  Chief Complaint  Patient presents with   Cough    Still ongoing, feels like there's mucus in chest    HPI: Martha Gilmore is a 80 y.o. female who presents to the office today to address the problems listed above in the chief complaint. Patient complains of persistent fatigue after having flu like symptoms including myalgias, ST, mild cough and some congestion. Sxs have been present for almost 2 weeks. However, most sxs have improved; now only minimal dry cough responsive to mucinex dm persists along with decreased energy and easy fatiguability. She caught the illness from her granddaughter who was diagnosed with the flu in early June.  Patient did take 2 home COVID test that were negative.  She is fully vaccinated against COVID.  She denies headache, sinus pain, thick purulent drainage, nausea, vomiting, shortness of breath or GI symptoms.  She is [place] to the beach next [place] with her family for vacation and wanted to make sure nothing worse is going on. Follow-up GERD: Reviewed CCM notes.  Has been on chronic PPI omeprazole 20 mg daily for several years.  She reports this controls her GERD symptoms well.  If she misses a day or evening 2 days, her symptoms will return.  No melena  Assessment  1. Viral syndrome   2. Gastroesophageal reflux disease without esophagitis      Plan  Viral syndrome: Possibly a missed COVID infection.  No signs of serious bacterial infection by symptoms or clinical exam today.  Continue supportive care with Mucinex DM and monitor.  May take several more weeks for energy to rebound.  No red flag symptoms.  She will return for further evaluation if not improving. GERD: Well-controlled on chronic PPI.  Given rebound symptoms with even minimal changes in medications, will continue monitoring.  Recheck B12 levels at her next physical.  Patient understands and agrees with care plan.  Follow up: As needed, complete physical in January  No orders of  the defined types were placed in this encounter.  No orders of the defined types were placed in this encounter.    I reviewed the patients updated PMH, FH, and SocHx.    Patient Active Problem List   Diagnosis Date Noted   Postablative hypothyroidism 12/20/2015    Priority: High   Chondromalacia, knee, right 11/05/2017    Priority: Medium   Osteoarthrosis, localized, primary, knee, right 11/05/2017    Priority: Medium   Major depression, chronic 04/16/2017    Priority: Medium   Mixed hyperlipidemia 04/16/2017    Priority: Medium   History of Graves' disease 03/22/2015    Priority: Medium   GERD (gastroesophageal reflux disease) 12/13/2012    Priority: Medium   Osteopenia 08/05/2012    Priority: Medium   DDD (degenerative disc disease), lumbosacral 10/22/2010    Priority: Medium   Bilateral sensorineural hearing loss 02/06/2016    Priority: Low   OAB (overactive bladder) 12/16/2013    Priority: Low   Osteoarthritis of CMC joint of thumb 08/13/2012    Priority: Low   Seasonal allergies 08/13/2012    Priority: Low   Urge and stress incontinence 08/13/2012    Priority: Low   Facet degeneration of lumbar region 01/06/2020   Clostridium difficile infection 04/16/2017   Family history of early CAD 04/16/2017   Current Meds  Medication Sig   atorvastatin (LIPITOR) 10 MG tablet TAKE 1 TABLET BY MOUTH EVERYDAY AT BEDTIME   benzonatate (TESSALON) 100 MG capsule Take 1  capsule (100 mg total) by mouth 2 (two) times daily as needed for cough.   DULoxetine (CYMBALTA) 20 MG capsule Take 1 capsule (20 mg total) by mouth daily.   levothyroxine (SYNTHROID) 100 MCG tablet Take 1 tablet (100 mcg total) by mouth daily.   Multiple Vitamins-Minerals (PRESERVISION AREDS PO) Take by mouth.   omeprazole (PRILOSEC) 20 MG capsule TAKE 1 CAPSULE BY MOUTH EVERY DAY   oxybutynin (DITROPAN-XL) 10 MG 24 hr tablet TAKE ONE TABLET BY MOUTH DAILY AT BEDTIME   Polyethyl Glycol-Propyl Glycol (SYSTANE OP)  Apply 1 drop to eye as needed.   Family History: Patient family history includes Brain cancer in her brother; Cancer in her father; Diabetes in her sister; Heart disease in her father, maternal grandmother, mother, and sister; Kidney cancer in her sister; Lung cancer in her sister. Social History:  Patient  reports that she has never smoked. She has never used smokeless tobacco. She reports that she does not drink alcohol and does not use drugs.  Review of Systems: Constitutional: negative for fever or malaise Ophthalmic: negative for photophobia, double vision or loss of vision Cardiovascular: negative for chest pain, dyspnea on exertion, or new LE swelling Respiratory: negative for SOB or persistent cough Gastrointestinal: negative for abdominal pain, change in bowel habits or melena Genitourinary: negative for dysuria or gross hematuria Musculoskeletal: negative for new gait disturbance or muscular weakness Integumentary: negative for new or persistent rashes Neurological: negative for TIA or stroke symptoms Psychiatric: negative for SI or delusions Allergic/Immunologic: negative for hives  Objective  Vitals: BP (!) 151/83   Pulse 68   Temp 97.9 F (36.6 C) (Temporal)   Ht 5' (1.524 m)   Wt 168 lb 9.6 oz (76.5 kg)   SpO2 98%   BMI 32.93 kg/m  General: no acute respiratory distress  Psych:  Alert and oriented, normal mood and affect HEENT: Normocephalic, nasal congestion present, TMs w/o erythema, OP with erythema w/o exudate, no LAD, supple neck, no sinus tenderness Cardiovascular:  RRR without murmur or gallop. no peripheral edema Respiratory:  Good breath sounds bilaterally, CTAB with normal respiratory effort Skin:  Warm, no rashes ives for medications and treatment plan prescribed today were discussed, and the patient expressed understanding of the given instructions. Patient is instructed to call or message via MyChart if he/she has any questions or concerns regarding our  treatment plan. No barriers to understanding were identified. We discussed Red Flag symptoms and signs in detail. Patient expressed understanding regarding what to do in case of urgent or emergency type symptoms.  Medication list was reconciled, printed and provided to the patient in AVS. Patient instructions and summary information was reviewed with the patient as documented in the AVS. This note was prepared with assistance of Dragon voice recognition software. Occasional wrong-word or sound-a-like substitutions may have occurred due to the inherent limitations of voice recognition software

## 2020-10-18 DIAGNOSIS — M5032 Other cervical disc degeneration, mid-cervical region, unspecified level: Secondary | ICD-10-CM | POA: Diagnosis not present

## 2020-10-18 DIAGNOSIS — M9902 Segmental and somatic dysfunction of thoracic region: Secondary | ICD-10-CM | POA: Diagnosis not present

## 2020-10-18 DIAGNOSIS — M5136 Other intervertebral disc degeneration, lumbar region: Secondary | ICD-10-CM | POA: Diagnosis not present

## 2020-10-18 DIAGNOSIS — S29012A Strain of muscle and tendon of back wall of thorax, initial encounter: Secondary | ICD-10-CM | POA: Diagnosis not present

## 2020-10-18 DIAGNOSIS — M9901 Segmental and somatic dysfunction of cervical region: Secondary | ICD-10-CM | POA: Diagnosis not present

## 2020-10-18 DIAGNOSIS — M9903 Segmental and somatic dysfunction of lumbar region: Secondary | ICD-10-CM | POA: Diagnosis not present

## 2020-10-19 DIAGNOSIS — R519 Headache, unspecified: Secondary | ICD-10-CM | POA: Diagnosis not present

## 2020-10-19 DIAGNOSIS — M47812 Spondylosis without myelopathy or radiculopathy, cervical region: Secondary | ICD-10-CM | POA: Diagnosis not present

## 2020-10-19 DIAGNOSIS — M542 Cervicalgia: Secondary | ICD-10-CM | POA: Diagnosis not present

## 2020-10-20 ENCOUNTER — Other Ambulatory Visit: Payer: Self-pay | Admitting: Family Medicine

## 2020-10-26 ENCOUNTER — Encounter: Payer: Self-pay | Admitting: Internal Medicine

## 2020-10-26 ENCOUNTER — Other Ambulatory Visit: Payer: Self-pay

## 2020-10-26 ENCOUNTER — Ambulatory Visit (INDEPENDENT_AMBULATORY_CARE_PROVIDER_SITE_OTHER): Payer: Medicare Other | Admitting: Internal Medicine

## 2020-10-26 VITALS — BP 135/73 | HR 65 | Ht 61.0 in | Wt 170.6 lb

## 2020-10-26 DIAGNOSIS — M8580 Other specified disorders of bone density and structure, unspecified site: Secondary | ICD-10-CM | POA: Diagnosis not present

## 2020-10-26 DIAGNOSIS — M4322 Fusion of spine, cervical region: Secondary | ICD-10-CM

## 2020-10-26 DIAGNOSIS — R7689 Other specified abnormal immunological findings in serum: Secondary | ICD-10-CM

## 2020-10-26 DIAGNOSIS — R768 Other specified abnormal immunological findings in serum: Secondary | ICD-10-CM | POA: Insufficient documentation

## 2020-10-26 DIAGNOSIS — Z8639 Personal history of other endocrine, nutritional and metabolic disease: Secondary | ICD-10-CM

## 2020-10-26 NOTE — Progress Notes (Signed)
Martha Gilmore, am serving as a Education administrator for Dr. Lynne Gilmore.   Martha Gilmore is a 80 y.o. female who presents to [place] at [facility] today for f/u neck pain. Pt was last seen by Dr. [name] on 09/27/20 and was advised to proceed w/ a rheumatologic work-up and was prescribed Cymbalta. Additionally, pt was referred to [facility], who r/u any systemic connective tissue disease. Today, pt reports neck is a little better but turning head is still an issue. Patient still has an ache on the right side of neck all the time, patients states that balance has been off and wasn't sure if it was from the Cymbalta or that she has not been wearing her hearing aid. The Cymbalta has made her have a constant nausea feeling. July 28th is her appointment with Neurosurgery for her first injection.  Dx testing: 09/27/20 Labs (ANA titer, sed rate, cyclic citrul peptide, ANA, HLA-B27 Antigen, rheumatoid factor)  08/17/20 C-spine MRI  07/08/20 C-spine XR  Pertinent review of systems: No fevers or chills  Relevant historical information: Cervical facet DJD   Exam:  BP 130/70 (BP Location: Left Arm, Patient Position: Sitting, Cuff Size: Normal)   Pulse 64   Ht $R'5\' 1"'dw$  (1.549 m)   Wt 170 lb (77.1 kg)   SpO2 97%   BMI 32.12 kg/m  General: Well Developed, well nourished, and in no acute distress.   MSK: Decreased cervical range of motion    Lab and Radiology Results  EXAM: MRI CERVICAL SPINE WITHOUT CONTRAST   TECHNIQUE: Multiplanar, multisequence MR imaging of the cervical spine was performed. No intravenous contrast was administered.   COMPARISON:  None.   FINDINGS: Alignment: Mild anterolisthesis at C3-4, C5-6, C6-7, and C7-T1.   Vertebrae: Marrow edema involving the left C1-2 facet and dens. No fracture lucency is superimposed across the dens. Marrow edema is seen on both sides of the right C7-T1 facet. No aggressive bone lesion.   Cord: Normal  signal and morphology   Posterior Fossa, vertebral arteries, paraspinal tissues: Negative   Disc levels:   C1-2: Retro dental ligamentous thickening with dens edema. No dens cortical erosion is seen. Facet osteoarthritis with spurring and marrow edema. Spurs encroach on the bilateral C2 nerve roots.   C2-3: Degenerative facet spurring.  Mild left foraminal narrowing   C3-4: Degenerative facet spurring on both sides. Disc narrowing and bulging with shallow central protrusion.   C4-5: Facet osteoarthritis with ankylosis. Disc narrowing with intervertebral ankylosis. No impingement   C5-6: Facet osteoarthritis and disc space narrowing with ankylosis.   C6-7: Facet osteoarthritis and disc space narrowing with ankylosis.   C7-T1:Disc collapse with left preferential bulging. Mild left foraminal stenosis. Mild bilateral foraminal stenosis.   IMPRESSION: 1. Active facet arthritis with marrow edema on the left at C1-2 and right at C7-T1. Contiguous marrow edema in the dens which is likely related to the facet disease given no superimposed erosion. 2. Degeneration with ankylosis from C4-C7, is there are known inflammatory arthritis? 3. Diffusely patent spinal canal.     Electronically Signed   By: Monte Fantasia M.D.   On: 08/17/2020 18:34  I, Martha Gilmore, personally (independently) visualized and performed the interpretation of the images attached in this note.   Assessment and Plan: 80 y.o. female with neck pain due to cervical facet DJD primarily left C1-2.  She has failed conservative management at this point.  She has what sounds like an upcoming facet injection with Dr.  Eichman at [place] [facility] at the end of this month which hopefully will be successful.  I prescribed Cymbalta at the last visit which sounds like its not very helpful.  We will try taking it at bedtime which may be a little more tolerable.  If not she will stop it and let me know.  Recheck as  needed.    Discussed warning signs or symptoms. Please see discharge instructions. Patient expresses understanding.   The above documentation has been reviewed and is accurate and complete Martha Gilmore, M.D.

## 2020-10-26 NOTE — Progress Notes (Signed)
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 )   History of Present Illness: Martha Gilmore is a 81 y.o. female here for chronic joint pains with concern for inflammatory arthritis with cervical spine ankylosis on MRI and positive autoantibody labs.  She has a history of chronic low back and neck pain that is ongoing since at least a few decades ago.  She recalls she believes 30 years ago seeing a rheumatologist for chronic low back symptoms treated with local injection with good symptom improvement.  She has not been on any long-term specific medications and symptoms previously were attributed to degenerative disease in the lumbosacral and cervical spine.  More recently her low back symptoms have been improved after ablation treatment but her neck remains the biggest problem now.  She has stiffness with decreased range of motion and pain without radiculopathy symptoms currently.  Besides the spine she does notice knee pain intermittently worse on the right side and lateral joint line.  She has fallen but not frequently and attributes this to clumsiness.  She denies any hand or foot swelling.  Labs reviewed 09/2020 ANA 1:80 mitotic, centrosome 1:80 speckled ESR 22 CCP neg HLA-B27 neg RF neg  04/2020 TSH wnl CMP eGFR 57.99 CBC wnl  Imaging reviewed 07/2020 MRI Cervical spine IMPRESSION: 1. Active facet arthritis with marrow edema on the left at C1-2 and right at C7-T1. Contiguous marrow edema in the dens which is likely related to the facet disease given  no superimposed erosion. 2. Degeneration with ankylosis from C4-C7, is there are known inflammatory arthritis? 3.  Diffusely patent spinal canal.  Activities of Daily Living:  Patient reports morning stiffness for several hours.   Patient Denies nocturnal pain.  Difficulty dressing/grooming: Denies Difficulty climbing stairs: Reports Difficulty getting out of chair: Denies Difficulty using hands for taps, buttons, cutlery, and/or writing: Reports  Review of Systems  Constitutional:  Negative for fatigue.  HENT:  Negative for mouth sores, mouth dryness and nose dryness.   Eyes:  Positive for visual disturbance and dryness. Negative for pain and itching.  Respiratory:  Positive for shortness of breath. Negative for cough, hemoptysis and difficulty breathing.   Cardiovascular:  Negative for chest pain, palpitations and swelling in legs/feet.  Gastrointestinal:  Positive for diarrhea. Negative for abdominal pain, blood in stool and constipation.  Endocrine: Negative for increased urination.  Genitourinary:  Negative for painful urination.  Musculoskeletal:  Positive for joint pain, joint pain and morning stiffness. Negative for joint swelling, myalgias, muscle weakness, muscle tenderness and myalgias.  Skin:  Negative for color change, rash and redness.  Allergic/Immunologic: Negative for susceptible to infections.  Neurological:  Positive for headaches. Negative for dizziness, numbness, memory loss and weakness.  Hematological:  Negative for swollen glands.  Psychiatric/Behavioral:  Negative for confusion and sleep disturbance.    PMFS History:  Patient Active Problem List   Diagnosis Date Noted   Positive ANA (antinuclear antibody) 10/26/2020   Cervical spine ankylosis 10/26/2020   Facet degeneration of lumbar region 01/06/2020   Chondromalacia, knee,  right 11/05/2017   Osteoarthrosis, localized, primary, knee, right 11/05/2017   Major depression, chronic 04/16/2017   Mixed hyperlipidemia 04/16/2017   Clostridium difficile infection 04/16/2017   Family history of early CAD 04/16/2017    Bilateral sensorineural hearing loss 02/06/2016   Postablative hypothyroidism 12/20/2015   History of Graves' disease 03/22/2015   OAB (overactive bladder) 12/16/2013   GERD (gastroesophageal reflux disease) 12/13/2012   Osteoarthritis of CMC joint of thumb 08/13/2012   Seasonal allergies 08/13/2012   Urge and stress incontinence 08/13/2012   Osteopenia 08/05/2012   DDD (degenerative disc disease), lumbosacral 10/22/2010    Past Medical History:  Diagnosis Date   Cataracts, bilateral 09/23/2015   Clostridium difficile infection 04/16/2017   H/o C.diff due to clindamicyin 09/2016; two rounds of treatment for cure. Dr. Benson [place]   Depression    Hyperlipidemia    Hypertension    OAB (overactive bladder) 12/16/2013   Osteopenia 08/05/2012   Dexa scan 07/2012 mild osteopenia, T = -1.1   Seasonal allergies    Urge and stress incontinence     Family History  Problem Relation Age of Onset   Heart disease Mother    Hypertension Mother    Cancer Father    Heart disease Father    Lung cancer Sister    Heart disease Sister    Diabetes Sister    Kidney cancer Sister    Brain cancer Brother    Past Surgical History:  Procedure Laterality Date   APPENDECTOMY  1971   CESAREAN SECTION     CHOLECYSTECTOMY     Teeth Implants     TRIGGER FINGER RELEASE     VAGINAL HYSTERECTOMY     Social History   Social History Narrative   Not on file   Immunization History  Administered Date(s) Administered   Influenza Split 01/15/2012   Influenza, High Dose Seasonal PF 01/30/2013, 01/05/2015, 02/17/2016, 02/02/2017, 01/18/2018, 12/11/2018, 12/23/2019   Influenza, Seasonal, Injecte, Preservative Fre 01/21/2014   Influenza-Unspecified 01/09/2011, 01/15/2012, 01/21/2014, 01/22/2018   PFIZER(Purple Top)SARS-COV-2 Vaccination 05/15/2019, 06/05/2019, 01/20/2020, 08/16/2020   Pneumococcal Conjugate-13 01/05/2015   Pneumococcal Polysaccharide-23 04/24/2006, 11/18/2007, 05/30/2012   Tdap 08/17/2010    Zoster Recombinat (Shingrix) 06/29/2018, 11/01/2018   Zoster, Live 04/25/1995     Objective: Vital Signs: BP 135/73 (BP Location: Right Arm, Patient Position: Sitting, Cuff Size: Normal)   Pulse 65   Ht 5' 1" (1.549 m)   Wt 170 lb 9.6 oz (77.4 kg)   BMI 32.23 kg/m    Physical Exam Constitutional:      Appearance: She is obese.  HENT:     Right Ear: External ear normal.     Left Ear: External ear normal.     Mouth/Throat:     Mouth: Mucous membranes are moist.     Pharynx: Oropharynx is clear.  Eyes:     Conjunctiva/sclera: Conjunctivae normal.  Cardiovascular:     Rate and Rhythm: Normal rate and regular rhythm.  Pulmonary:     Effort: Pulmonary effort is normal.     Breath sounds: Normal breath sounds.  Skin:    General: Skin is warm and dry.     Findings: No rash.  Neurological:     General: No focal deficit present.     Mental Status: She is alert.     Deep Tendon Reflexes: Reflexes normal.  Psychiatric:        Mood and Affect: Mood normal.     Musculoskeletal Exam:  Neck decreased lateral rotation range of motion  bilaterally with tenderness Normal shoulder range of motion with no tenderness, normal wrist and finger range of movement without synovitis there is squaring of the first [facility] joint bilaterally Right tenderness over SI joint and lumbosacral paraspinal muscles no pain on left side, normal modified Schober's exam, normal wall to tragus distance Right knee lateral joint line tenderness to palpation with no effusion No tenderness to heel or MTP pressure   Investigation: No additional findings.  Imaging: No results found.  Recent Labs: Lab Results  Component Value Date   WBC 5.2 04/29/2020   HGB 13.6 04/29/2020   PLT 197.0 04/29/2020   NA 138 04/29/2020   K 4.2 04/29/2020   CL 103 04/29/2020   CO2 29 04/29/2020   GLUCOSE 90 04/29/2020   BUN 19 04/29/2020   CREATININE 0.94 04/29/2020   BILITOT 0.7 04/29/2020   ALKPHOS 88 04/29/2020   AST 18  04/29/2020   ALT 12 04/29/2020   PROT 6.9 04/29/2020   ALBUMIN 4.3 04/29/2020   CALCIUM 9.8 04/29/2020    Speciality Comments: No specialty comments available.  Procedures:  No procedures performed Allergies: Aspirin, Meperidine, Penicillins, Statins, Cephalexin, Clindamycin/lincomycin, Demerol  [meperidine hcl], and Tape   Assessment / Plan:     Visit Diagnoses: Positive ANA (antinuclear antibody) History of Graves' disease  Positive ANA antibodies at low titer physical exam and history today do not reveal any specific clinical features for a systemic connective tissue disease.  She has a past medical history including Graves' disease a minority of patients with autoimmune thyroid disease can have positive ANA as well.  I do not see any other specific cause for this currently but in the absence of clinical disease recommend against further work-up or treatment at this time.  Osteopenia, unspecified location  Osteopenia by most recent DEXA 4/22 with a T score of -2.1 her calculated FRAX score indicates 4.3% 10-year risk of hip fracture which is high risk and she reports some falls..  Based on this she would be candidate for treatment for her low bone density  Cervical spine ankylosis  Lower cervical spine ankylosis with no erosive changes seen on recent MRI.  Review of past lumbar spine imaging does not reveal any ankylosis or erosions at the low back.  She describes more recent progression of back pain does not sound typical for AS has negative rheumatoid serology I suspect this is secondary to chronic degenerative change and she is already established for management elsewhere.  Orders: No orders of the defined types were placed in this encounter.  No orders of the defined types were placed in this encounter.   Follow-Up Instructions: No follow-ups on file.   Collier Salina, MD  Note - This record has been created using [employer].  Chart creation errors have been  sought, but may not always  have been located. Such creation errors do not reflect on  the standard of medical care.

## 2020-10-27 ENCOUNTER — Ambulatory Visit (INDEPENDENT_AMBULATORY_CARE_PROVIDER_SITE_OTHER): Payer: Medicare Other | Admitting: Family Medicine

## 2020-10-27 ENCOUNTER — Encounter: Payer: Self-pay | Admitting: Family Medicine

## 2020-10-27 VITALS — BP 130/70 | HR 64 | Ht 61.0 in | Wt 170.0 lb

## 2020-10-27 DIAGNOSIS — M47812 Spondylosis without myelopathy or radiculopathy, cervical region: Secondary | ICD-10-CM

## 2020-10-27 NOTE — Patient Instructions (Signed)
Thank you for coming in today.   Proceed to the injection.   Try a soft cervical collar.   Try the Cymbalta (duloxetine) at bedtime.  If not better then just stop it and let me know. If you do find out after stopping that it was better on it let me know.   Let me know how things go.

## 2020-10-28 DIAGNOSIS — M5136 Other intervertebral disc degeneration, lumbar region: Secondary | ICD-10-CM | POA: Diagnosis not present

## 2020-10-28 DIAGNOSIS — M9903 Segmental and somatic dysfunction of lumbar region: Secondary | ICD-10-CM | POA: Diagnosis not present

## 2020-10-28 DIAGNOSIS — S29012A Strain of muscle and tendon of back wall of thorax, initial encounter: Secondary | ICD-10-CM | POA: Diagnosis not present

## 2020-10-28 DIAGNOSIS — M5032 Other cervical disc degeneration, mid-cervical region, unspecified level: Secondary | ICD-10-CM | POA: Diagnosis not present

## 2020-10-28 DIAGNOSIS — M9902 Segmental and somatic dysfunction of thoracic region: Secondary | ICD-10-CM | POA: Diagnosis not present

## 2020-10-28 DIAGNOSIS — M9901 Segmental and somatic dysfunction of cervical region: Secondary | ICD-10-CM | POA: Diagnosis not present

## 2020-11-04 DIAGNOSIS — H353131 Nonexudative age-related macular degeneration, bilateral, early dry stage: Secondary | ICD-10-CM | POA: Diagnosis not present

## 2020-11-04 DIAGNOSIS — H16223 Keratoconjunctivitis sicca, not specified as Sjogren's, bilateral: Secondary | ICD-10-CM | POA: Diagnosis not present

## 2020-11-11 DIAGNOSIS — M9901 Segmental and somatic dysfunction of cervical region: Secondary | ICD-10-CM | POA: Diagnosis not present

## 2020-11-11 DIAGNOSIS — S29012A Strain of muscle and tendon of back wall of thorax, initial encounter: Secondary | ICD-10-CM | POA: Diagnosis not present

## 2020-11-11 DIAGNOSIS — M9902 Segmental and somatic dysfunction of thoracic region: Secondary | ICD-10-CM | POA: Diagnosis not present

## 2020-11-11 DIAGNOSIS — M9903 Segmental and somatic dysfunction of lumbar region: Secondary | ICD-10-CM | POA: Diagnosis not present

## 2020-11-11 DIAGNOSIS — M5032 Other cervical disc degeneration, mid-cervical region, unspecified level: Secondary | ICD-10-CM | POA: Diagnosis not present

## 2020-11-11 DIAGNOSIS — M5136 Other intervertebral disc degeneration, lumbar region: Secondary | ICD-10-CM | POA: Diagnosis not present

## 2020-11-18 DIAGNOSIS — M47812 Spondylosis without myelopathy or radiculopathy, cervical region: Secondary | ICD-10-CM | POA: Diagnosis not present

## 2020-11-25 ENCOUNTER — Telehealth: Payer: Self-pay

## 2020-11-25 NOTE — Telephone Encounter (Signed)
Patient is calling in stating that she woke up and was having some dizziness and unsteady on her feet. Sent patient through triage but patient then called back and said she doesn't need to speak with triage nurse that all she needs is an appointment. I then offered an appointment for tomorrow but patient then hung up and ended the call.

## 2020-11-25 NOTE — Telephone Encounter (Signed)
Received a call from Claiborne Billings, [place] with team health, got patient to go through with triage and outcome was to be seen in 24 hours. Patient is scheduled for tomorrow.

## 2020-11-25 NOTE — Telephone Encounter (Signed)
FYI, pt seeing you tomorrow.

## 2020-11-25 NOTE — Telephone Encounter (Signed)
Nurse Assessment Nurse: Clovis Riley RN, Georgina Peer Date/Time (Eastern Time): 11/25/2020 10:40:12 AM Confirm and document reason for call. If symptomatic, describe symptoms. ---Caller states she is experiencing dizziness. States she was sick the whole month of june and was given tessalon perles and mucinex. States she is having neck pain from arthritis and got a cortisone shot. States she was put on duloxetine for depression and pain that she started on june 14th and stopped it on july 31st and has started back on her citalapram. States she feels unsteady and has been using a cane some. States she feels swimmy headed. Does the patient have any new or worsening symptoms? ---Yes Will a triage be completed? ---Yes Related visit to physician within the last 2 weeks? ---No Does the PT have any chronic conditions? (i.e. diabetes, asthma, this includes High risk factors for pregnancy, etc.) ---Yes List chronic conditions. ---thyroid, depression, GERD Is this a behavioral health or substance abuse call? ---No Guidelines Guideline Title Affirmed Question Affirmed Notes Nurse Date/Time (Eastern Time) Dizziness - Lightheadedness [1] MODERATE dizziness (e.g., interferes with Clovis Riley, RN, Georgina Peer 11/25/2020 10:45:36 AM PLEASE NOTE: All timestamps contained within this report are represented as [place] Standard Time. CONFIDENTIALTY NOTICE: This fax transmission is intended only for the addressee. It contains information that is legally privileged, confidential or otherwise protected from use or disclosure. If you are not the intended recipient, you are strictly prohibited from reviewing, disclosing, copying using or disseminating any of this information or taking any action in reliance on or regarding this information. If you have received this fax in error, please notify us immediately by telephone so that we can arrange for its return to [place]. Phone: [phone], Toll-Free: [phone], Fax:  [phone] Page: 2 of 2 Call Id: [id number] Guidelines Guideline Title Affirmed Question Affirmed Notes Nurse Date/Time Eilene Ghazi Time) normal activities) AND [2] has NOT been evaluated by physician for this (Exception: dizziness caused by heat exposure, sudden standing, or poor fluid intake) Disp. Time Eilene Ghazi Time) Disposition Final User 11/25/2020 10:49:49 AM See PCP within 24 Hours Yes Clovis Riley, RN, Leilani Merl Disagree/Comply Comply Caller Understands Yes PreDisposition Call Doctor Care Advice Given Per Guideline SEE PCP WITHIN 24 HOURS: * IF OFFICE WILL BE OPEN: You need to be examined within the next 24 hours. Call your doctor (or NP/PA) when the office opens and make an appointment. DRINK FLUIDS: * Drink several glasses of fruit juice, other clear fluids or water. * This will improve hydration and blood glucose. * If the weather is hot or you have a fever, make sure the fluids are cold. LIE DOWN AND REST: * Lie down with feet elevated for 1 hour. * This will improve circulation and increase blood flow to the brain. CALL BACK IF: * You become worse CARE ADVICE given per Dizziness (Adult) guideline. * Passes out (faints) Referrals REFERRED TO PCP OFFIC

## 2020-11-25 NOTE — Telephone Encounter (Signed)
FYI, pt scheduled to see you tomorrow 

## 2020-11-26 ENCOUNTER — Ambulatory Visit (INDEPENDENT_AMBULATORY_CARE_PROVIDER_SITE_OTHER): Payer: Medicare Other | Admitting: Family Medicine

## 2020-11-26 ENCOUNTER — Encounter: Payer: Self-pay | Admitting: Family Medicine

## 2020-11-26 ENCOUNTER — Other Ambulatory Visit: Payer: Self-pay

## 2020-11-26 ENCOUNTER — Ambulatory Visit (INDEPENDENT_AMBULATORY_CARE_PROVIDER_SITE_OTHER): Payer: Medicare Other

## 2020-11-26 VITALS — BP 134/79 | HR 74 | Temp 97.7°F | Ht 61.0 in | Wt 167.5 lb

## 2020-11-26 DIAGNOSIS — Z8249 Family history of ischemic heart disease and other diseases of the circulatory system: Secondary | ICD-10-CM

## 2020-11-26 DIAGNOSIS — T887XXA Unspecified adverse effect of drug or medicament, initial encounter: Secondary | ICD-10-CM

## 2020-11-26 DIAGNOSIS — R002 Palpitations: Secondary | ICD-10-CM

## 2020-11-26 DIAGNOSIS — E039 Hypothyroidism, unspecified: Secondary | ICD-10-CM

## 2020-11-26 DIAGNOSIS — R42 Dizziness and giddiness: Secondary | ICD-10-CM | POA: Diagnosis not present

## 2020-11-26 DIAGNOSIS — E890 Postprocedural hypothyroidism: Secondary | ICD-10-CM | POA: Diagnosis not present

## 2020-11-26 DIAGNOSIS — I4892 Unspecified atrial flutter: Secondary | ICD-10-CM

## 2020-11-26 LAB — COMPREHENSIVE METABOLIC PANEL
ALT: 16 U/L (ref 0–35)
AST: 17 U/L (ref 0–37)
Albumin: 4 g/dL (ref 3.5–5.2)
Alkaline Phosphatase: 98 U/L (ref 39–117)
BUN: 22 mg/dL (ref 6–23)
CO2: 29 mEq/L (ref 19–32)
Calcium: 9.2 mg/dL (ref 8.4–10.5)
Chloride: 102 mEq/L (ref 96–112)
Creatinine, Ser: 1.04 mg/dL (ref 0.40–1.20)
GFR: 51.16 mL/min — ABNORMAL LOW (ref >60.00)
Glucose, Bld: 76 mg/dL (ref 70–99)
Potassium: 4 mEq/L (ref 3.5–5.1)
Sodium: 138 mEq/L (ref 135–145)
Total Bilirubin: 0.5 mg/dL (ref 0.2–1.2)
Total Protein: 6.8 g/dL (ref 6.0–8.3)

## 2020-11-26 LAB — CBC WITH DIFFERENTIAL/PLATELET
Basophils Absolute: 0.1 10*3/uL (ref 0.0–0.1)
Basophils Relative: 1.2 % (ref 0.0–3.0)
Eosinophils Absolute: 0.1 10*3/uL (ref 0.0–0.7)
Eosinophils Relative: 1.1 % (ref 0.0–5.0)
HCT: 44.3 % (ref 36.0–46.0)
Hemoglobin: 14.4 g/dL (ref 12.0–15.0)
Lymphocytes Relative: 18.1 % (ref 12.0–46.0)
Lymphs Abs: 1.7 10*3/uL (ref 0.7–4.0)
MCHC: 32.5 g/dL (ref 30.0–36.0)
MCV: 88.3 fl (ref 78.0–100.0)
Monocytes Absolute: 0.8 10*3/uL (ref 0.1–1.0)
Monocytes Relative: 8.3 % (ref 3.0–12.0)
Neutro Abs: 6.6 10*3/uL (ref 1.4–7.7)
Neutrophils Relative %: 71.3 % (ref 43.0–77.0)
Platelets: 309 10*3/uL (ref 150.0–400.0)
RBC: 5.02 Mil/uL (ref 3.87–5.11)
RDW: 14.5 % (ref 11.5–15.5)
WBC: 9.3 10*3/uL (ref 4.0–10.5)

## 2020-11-26 LAB — LIPID PANEL
Cholesterol: 212 mg/dL — ABNORMAL HIGH (ref 0–200)
HDL: 65.1 mg/dL (ref >39.00)
LDL Cholesterol: 130 mg/dL — ABNORMAL HIGH (ref 0–99)
NonHDL: 146.69
Total CHOL/HDL Ratio: 3
Triglycerides: 83 mg/dL (ref 0.0–149.0)
VLDL: 16.6 mg/dL (ref 0.0–40.0)

## 2020-11-26 LAB — TSH: TSH: 1.1 u[IU]/mL (ref 0.35–5.50)

## 2020-11-26 NOTE — Patient Instructions (Signed)
Please follow up as scheduled for your next visit with me: 05/02/2021   We will call you to get you into see a cardiologist to evaluate your heart and heart rhythm.  Start a baby aspirin once a day.  Go to the ER for any chest pain, shortness of breath or racing heart beats.   I will release your lab results to you on your MyChart account with further instructions. Please reply with any questions.   Do not take anymore cymbalta.   If you have any questions or concerns, please don't hesitate to send me a message via MyChart or call the office at [phone]. Thank you for visiting with [place] today! It's our pleasure caring for you.

## 2020-11-26 NOTE — Progress Notes (Unsigned)
Enrolled patient for a 3 day Zio XT monitor to be mailed to patients home  

## 2020-11-26 NOTE — Progress Notes (Addendum)
Subjective  CC:  Chief Complaint  Patient presents with   Dizziness    Unsteady x     HPI: Martha Gilmore is a 80 y.o. female who presents to the office today to address the problems listed above in the chief complaint. 80 yo w/o neck and low back pain being evaluated and treated by SM and NS c/o several weeks of feeling "off". C/o dysequilibrium but not true vertigo or lightheadedness. She was switched from celexa to cymbalta recently and that is when sxs started. She has not tolerated the cymbalta well. She tried taking it at night but still felt badly and "off" so has switched back to lexapro as of several days ago. No falls or balance but feels shaky. Denies headaches, double vision, dysarthria but admits has been having palpitations for several weeks. No cp or sob, no diaphoresis. No h/o heart disease. Had normal treadmill stress test in 2019 but images are not visible. She has hypothyroidism and saw endocrine today for recheck. She denies sweats or anxiety symptoms per se. She is compliant with meds. No LE edema, DOE or nocturnal sxs. She did have some steroid injections into her neck a few weeks ago. No other new meds recently.   Assessment  1. Dysequilibrium   2. Medication side effects   3. Acquired hypothyroidism   4. Palpitations   5. Atrial flutter, unspecified type ([place])   6. Family history of early CAD      Plan  Atrial flutter and dyequilibrium:   needs further evaluation. ? Due to thyroid disease (labs pending) or heart problem or mood medication changes/SSRI w/d. Heart monitor to r/o afib and better clarify arrhythmia. Refer to cardiology for further eval. Await thyroid results. Check other labs. Continue celexa. Educated on worsening sxs and to ER for cp, racing heart, sob.   In addition to the time getting and reading the EKG, I spent 43 minutes managing her acute and chronic problems.  Greater than 50% of this time was devoted to face to face counseling with the patient  and coordination of care. We discussed her diagnosis, prognosis, treatment options and treatment plan is documented above.    Follow up: as scheduled.  05/02/2021  Orders Placed This Encounter  Procedures   Comprehensive metabolic panel   CBC with Differential/Platelet   TSH   Lipid panel   Ambulatory referral to Cardiology   LONG TERM MONITOR (3-14 DAYS)   EKG 12-Lead   No orders of the defined types were placed in this encounter.     I reviewed the patients updated PMH, FH, and SocHx.    Patient Active Problem List   Diagnosis Date Noted   Postablative hypothyroidism 12/20/2015    Priority: High   Chondromalacia, knee, right 11/05/2017    Priority: Medium   Osteoarthrosis, localized, primary, knee, right 11/05/2017    Priority: Medium   Major depression, chronic 04/16/2017    Priority: Medium   Mixed hyperlipidemia 04/16/2017    Priority: Medium   History of Graves' disease 03/22/2015    Priority: Medium   GERD (gastroesophageal reflux disease) 12/13/2012    Priority: Medium   Osteopenia 08/05/2012    Priority: Medium   DDD (degenerative disc disease), lumbosacral 10/22/2010    Priority: Medium   Bilateral sensorineural hearing loss 02/06/2016    Priority: Low   OAB (overactive bladder) 12/16/2013    Priority: Low   Osteoarthritis of CMC joint of thumb 08/13/2012    Priority: Low   Seasonal  allergies 08/13/2012    Priority: Low   Urge and stress incontinence 08/13/2012    Priority: Low   Positive ANA (antinuclear antibody) 10/26/2020   Cervical spine ankylosis 10/26/2020   Facet degeneration of lumbar region 01/06/2020   Clostridium difficile infection 04/16/2017   Family history of early CAD 04/16/2017   Current Meds  Medication Sig   atorvastatin (LIPITOR) 10 MG tablet TAKE 1 TABLET BY MOUTH EVERYDAY AT BEDTIME   levothyroxine (SYNTHROID) 100 MCG tablet Take 1 tablet (100 mcg total) by mouth daily.   Multiple Vitamins-Minerals (PRESERVISION AREDS PO)  Take by mouth.   omeprazole (PRILOSEC) 20 MG capsule TAKE 1 CAPSULE BY MOUTH EVERY DAY   oxybutynin (DITROPAN-XL) 10 MG 24 hr tablet TAKE ONE TABLET BY MOUTH DAILY AT BEDTIME   Polyethyl Glycol-Propyl Glycol (SYSTANE OP) Apply 1 drop to eye as needed.    Allergies: Patient is allergic to aspirin, meperidine, penicillins, statins, cephalexin, clindamycin/lincomycin, demerol  [meperidine hcl], and tape. Family History: Patient family history includes Brain cancer in her brother; Cancer in her father; Diabetes in her sister; Heart disease in her father, mother, and sister; Hypertension in her mother; Kidney cancer in her sister; Lung cancer in her sister. Social History:  Patient  reports that she has never smoked. She has never used smokeless tobacco. She reports that she does not drink alcohol and does not use drugs.  Review of Systems: Constitutional: Negative for fever malaise or anorexia Cardiovascular: negative for chest pain Respiratory: negative for SOB or persistent cough Gastrointestinal: negative for abdominal pain  Objective  Vitals: BP 134/79   Pulse 74   Temp 97.7 F (36.5 C)   Ht '5\' 1"'$  (1.549 m)   Wt 167 lb 8 oz (76 kg)   BMI 31.65 kg/m  General: no acute distress , A&Ox3 HEENT: PEERL, conjunctiva normal, neck is supple Cardiovascular:  atopic beats audile. No murmur Respiratory:  Good breath sounds bilaterally, CTAB with normal respiratory effort Skin:  Warm, no rashes Neuro: nl gait. Non focal.  Neg orthostatics  EKG: atrial flutter, anterior q waves, non specific T wave changes no comparison  Commons side effects, risks, benefits, and alternatives for medications and treatment plan prescribed today were discussed, and the patient expressed understanding of the given instructions. Patient is instructed to call or message via MyChart if he/she has any questions or concerns regarding our treatment plan. No barriers to understanding were identified. We discussed [place] symptoms and signs in detail. Patient expressed understanding regarding what to do in case of urgent or emergency type symptoms.  Medication list was reconciled, printed and provided to the patient in AVS. Patient instructions and summary information was reviewed with the patient as documented in the AVS. This note was prepared with assistance of Dragon voice recognition software. Occasional wrong-word or sound-a-like substitutions may have occurred due to the inherent limitations of voice recognition software  This visit occurred during the SARS-CoV-2 public health emergency.  Safety protocols were in place, including screening questions prior to the visit, additional usage of staff PPE, and extensive cleaning of exam room while observing appropriate contact time as indicated for disinfecting solutions.

## 2020-11-29 ENCOUNTER — Encounter: Payer: Self-pay | Admitting: Family Medicine

## 2020-11-29 NOTE — Telephone Encounter (Signed)
Lvm for the pt to call the office back. 

## 2020-11-30 DIAGNOSIS — I4892 Unspecified atrial flutter: Secondary | ICD-10-CM

## 2020-11-30 NOTE — Telephone Encounter (Signed)
2nd attempt to give message below. Lvm for the pt to call the office back!

## 2020-11-30 NOTE — Telephone Encounter (Signed)
Noted  

## 2020-11-30 NOTE — Telephone Encounter (Signed)
Patient is returning call, gave message below.

## 2020-12-01 ENCOUNTER — Encounter: Payer: Self-pay | Admitting: Family Medicine

## 2020-12-02 DIAGNOSIS — M9903 Segmental and somatic dysfunction of lumbar region: Secondary | ICD-10-CM | POA: Diagnosis not present

## 2020-12-02 DIAGNOSIS — M9901 Segmental and somatic dysfunction of cervical region: Secondary | ICD-10-CM | POA: Diagnosis not present

## 2020-12-02 DIAGNOSIS — S29012A Strain of muscle and tendon of back wall of thorax, initial encounter: Secondary | ICD-10-CM | POA: Diagnosis not present

## 2020-12-02 DIAGNOSIS — M9902 Segmental and somatic dysfunction of thoracic region: Secondary | ICD-10-CM | POA: Diagnosis not present

## 2020-12-02 DIAGNOSIS — M5136 Other intervertebral disc degeneration, lumbar region: Secondary | ICD-10-CM | POA: Diagnosis not present

## 2020-12-02 DIAGNOSIS — M5032 Other cervical disc degeneration, mid-cervical region, unspecified level: Secondary | ICD-10-CM | POA: Diagnosis not present

## 2020-12-03 DIAGNOSIS — M47812 Spondylosis without myelopathy or radiculopathy, cervical region: Secondary | ICD-10-CM | POA: Diagnosis not present

## 2020-12-03 DIAGNOSIS — R519 Headache, unspecified: Secondary | ICD-10-CM | POA: Diagnosis not present

## 2020-12-07 DIAGNOSIS — I4892 Unspecified atrial flutter: Secondary | ICD-10-CM | POA: Diagnosis not present

## 2020-12-24 ENCOUNTER — Telehealth: Payer: Self-pay | Admitting: Pharmacist

## 2020-12-24 NOTE — Chronic Care Management (AMB) (Addendum)
    Chronic Care Management Pharmacy Assistant   Name: Martha Gilmore  MRN: [id number] DOB: Mar 22, 1941   Reason for Encounter: General Adherence Call    Recent office visits:  11/26/2020 OV (PCP) Leamon Arnt, MD; Atrial flutter and dyequilibrium:   needs further evaluation. Refer to cardiology, no medication changes indicated.  10/07/2020 OV (PCP) Leamon Arnt, MD; Viral syndrome: Possibly a missed COVID infection. Continue supportive care with Mucinex DM and monitor  Recent consult visits:  11/26/2020 OV (endocrinology) Adair Patter, MD; no medication changes indicated.  10/27/2020 OV (sports medicine) Gregor Hams, MD; I prescribed Cymbalta at the last visit which sounds like its not very helpful.  We will try taking it at bedtime which may be a little more tolerable.  If not she will stop it and let me know.  Recheck as needed.  09/27/2020 OV (sports medicine) Gregor Hams, MD; no medication changes indicated.  Hospital visits:  None in previous 6 months  Medications: Outpatient Encounter Medications as of 12/24/2020  Medication Sig   atorvastatin (LIPITOR) 10 MG tablet TAKE 1 TABLET BY MOUTH EVERYDAY AT BEDTIME   levothyroxine (SYNTHROID) 100 MCG tablet Take 1 tablet (100 mcg total) by mouth daily.   Multiple Vitamins-Minerals (PRESERVISION AREDS PO) Take by mouth.   omeprazole (PRILOSEC) 20 MG capsule TAKE 1 CAPSULE BY MOUTH EVERY DAY   oxybutynin (DITROPAN-XL) 10 MG 24 hr tablet TAKE ONE TABLET BY MOUTH DAILY AT BEDTIME   Polyethyl Glycol-Propyl Glycol (SYSTANE OP) Apply 1 drop to eye as needed.   No facility-administered encounter medications on file as of 12/24/2020.    Patient Questions: Have you had any problems recently with your health? Patient states she has not had any problems recently with her health.  Have you had any problems with your pharmacy? Patient states she has not had any problems with her pharmacy.  What issues or side effects are you  having with your medications? Patient states she has not had any side effects or issues with any of her medications.  What would you like me to pass along to [name], CPP for him to help you with?  Patient states she does not have anything to pass along at this time.  What can we do to take care of you better? Patient did not have any suggestions. She is happy with her current level of care.  Care Gaps: Annual Wellness Exam: Completed Zoster Vaccines- Shingrix: Completed COVID-19 Vaccination: Completed PPSV23 Vaccination: Completed Influenza Vaccination: Overdue since 11/22/2020 Tetanus/DTAP: Overdue since 08/16/2020 HPV Vaccines: Aged Out Mammogram: 04/30/2020 Dexa Scan: Next due on 08/18/2023  I spoke with the patient about her overdue care gaps. She is aware she is due for both influenza and tetanus vaccinations. She states she will discuss this with Dr. [name] at her next follow up visit.  Future Appointments  Date Time Provider [place]  02/08/2021 12:40 PM Sueanne Margarita, MD [facility]  05/02/2021  8:00 AM Leamon Arnt, MD [facility]  06/09/2021  8:45 AM [facility] HEALTH COACH [facility]    Star Rating Drugs: Atorvastatin 10 mg last filled 10/22/2020 90 DS  April D Calhoun, [place] Pharmacist Assistant [phone]   5 minutes spent in review, coordination, and documentation.  Reviewed by: Beverly Milch, PharmD Clinical Pharmacist [phone]

## 2020-12-28 ENCOUNTER — Other Ambulatory Visit: Payer: Self-pay

## 2020-12-28 DIAGNOSIS — I4892 Unspecified atrial flutter: Secondary | ICD-10-CM

## 2020-12-28 DIAGNOSIS — I4891 Unspecified atrial fibrillation: Secondary | ICD-10-CM

## 2020-12-28 MED ORDER — DILTIAZEM HCL ER COATED BEADS 120 MG PO CP24
120.0000 mg | ORAL_CAPSULE | Freq: Every day | ORAL | 3 refills | Status: DC
Start: 2020-12-28 — End: 2021-04-04

## 2020-12-28 NOTE — Progress Notes (Signed)
Medication and echo ordered per Billey Chang, MD

## 2020-12-29 ENCOUNTER — Other Ambulatory Visit: Payer: Self-pay

## 2020-12-29 ENCOUNTER — Encounter: Payer: Self-pay | Admitting: Cardiology

## 2020-12-29 ENCOUNTER — Ambulatory Visit (INDEPENDENT_AMBULATORY_CARE_PROVIDER_SITE_OTHER): Payer: Medicare Other | Admitting: Cardiology

## 2020-12-29 VITALS — BP 142/84 | HR 116 | Ht 60.0 in | Wt 170.0 lb

## 2020-12-29 DIAGNOSIS — I4892 Unspecified atrial flutter: Secondary | ICD-10-CM | POA: Diagnosis not present

## 2020-12-29 DIAGNOSIS — R5383 Other fatigue: Secondary | ICD-10-CM | POA: Diagnosis not present

## 2020-12-29 DIAGNOSIS — I4891 Unspecified atrial fibrillation: Secondary | ICD-10-CM

## 2020-12-29 DIAGNOSIS — R011 Cardiac murmur, unspecified: Secondary | ICD-10-CM

## 2020-12-29 DIAGNOSIS — R0602 Shortness of breath: Secondary | ICD-10-CM | POA: Diagnosis not present

## 2020-12-29 MED ORDER — APIXABAN 5 MG PO TABS: 5.0000 mg | ORAL_TABLET | Freq: Two times a day (BID) | ORAL | 3 refills | Status: DC

## 2020-12-29 NOTE — Patient Instructions (Signed)
Medication Instructions:  Your physician has recommended you make the following change in your medication:  START: Eliquis 5 mg twice daily *If you need a refill on your cardiac medications before your next appointment, please call your pharmacy*   Lab Work: None If you have labs (blood work) drawn today and your tests are completely normal, you will receive your results only by: [place] (if you have MyChart) OR A paper copy in the mail If you have any lab test that is abnormal or we need to change your treatment, we will call you to review the results.   Testing/Procedures: Your physician has recommended that you have a sleep study. This test records several body functions during sleep, including: brain activity, eye movement, oxygen and carbon dioxide blood levels, heart rate and rhythm, breathing rate and rhythm, the flow of air through your mouth and nose, snoring, body muscle movements, and chest and belly movement.  Your physician has requested that you have an echocardiogram. Echocardiography is a painless test that uses sound waves to create images of your heart. It provides your doctor with information about the size and shape of your heart and how well your heart's chambers and valves are working. This procedure takes approximately one hour. There are no restrictions for this procedure.    Follow-Up: At [facility], you and your health needs are our priority.  As part of our continuing mission to provide you with exceptional heart care, we have created designated Provider Care Teams.  These Care Teams include your primary Cardiologist (physician) and Advanced Practice Providers (APPs -  Physician Assistants and Nurse Practitioners) who all work together to provide you with the care you need, when you need it.  We recommend signing up for the patient portal called "MyChart".  Sign up information is provided on this After Visit Summary.  MyChart is used to connect with  patients for Virtual Visits (Telemedicine).  Patients are able to view lab/test results, encounter notes, upcoming appointments, etc.  Non-urgent messages can be sent to your provider as well.   To learn more about what you can do with MyChart, go to [email or web address].    Your next appointment:   3 month(s)  The format for your next appointment:   In Person  Provider:   Berniece Salines, DO [address], [place], [place] [address]    Other Instructions Echocardiogram An echocardiogram is a test that uses sound waves (ultrasound) to produce images of the heart. Images from an echocardiogram can provide important information about: Heart size and shape. The size and thickness and movement of your heart's walls. Heart muscle function and strength. Heart valve function or if you have stenosis. Stenosis is when the heart valves are too narrow. If blood is flowing backward through the heart valves (regurgitation). A tumor or infectious growth around the heart valves. Areas of heart muscle that are not working well because of poor blood flow or injury from a heart attack. Aneurysm detection. An aneurysm is a weak or damaged part of an artery wall. The wall bulges out from the normal force of blood pumping through the body. Tell a health care provider about: Any allergies you have. All medicines you are taking, including vitamins, herbs, eye drops, creams, and over-the-counter medicines. Any blood disorders you have. Any surgeries you have had. Any medical conditions you have. Whether you are pregnant or may be pregnant. What are the risks? Generally, this is a safe test. However, problems may occur, including  an allergic reaction to dye (contrast) that may be used during the test. What happens before the test? No specific preparation is needed. You may eat and drink normally. What happens during the test?  You will take off your clothes from the waist up and put on a hospital  gown. Electrodes or electrocardiogram (ECG)patches may be placed on your chest. The electrodes or patches are then connected to a device that monitors your heart rate and rhythm. You will lie down on a table for an ultrasound exam. A gel will be applied to your chest to help sound waves pass through your skin. A handheld device, called a transducer, will be pressed against your chest and moved over your heart. The transducer produces sound waves that travel to your heart and bounce back (or "echo" back) to the transducer. These sound waves will be captured in real-time and changed into images of your heart that can be viewed on a video monitor. The images will be recorded on a computer and reviewed by your health care provider. You may be asked to change positions or hold your breath for a short time. This makes it easier to get different views or better views of your heart. In some cases, you may receive contrast through an IV in one of your veins. This can improve the quality of the pictures from your heart. The procedure may vary among health care providers and hospitals. What can I expect after the test? You may return to your normal, everyday life, including diet, activities, and medicines, unless your health care provider tells you not to do that. Follow these instructions at home: It is up to you to get the results of your test. Ask your health care provider, or the department that is doing the test, when your results will be ready. Keep all follow-up visits. This is important. Summary An echocardiogram is a test that uses sound waves (ultrasound) to produce images of the heart. Images from an echocardiogram can provide important information about the size and shape of your heart, heart muscle function, heart valve function, and other possible heart problems. You do not need to do anything to prepare before this test. You may eat and drink normally. After the echocardiogram is completed, you  may return to your normal, everyday life, unless your health care provider tells you not to do that. This information is not intended to replace advice given to you by your health care provider. Make sure you discuss any questions you have with your health care provider. Document Revised: 12/02/2019 Document Reviewed: 12/02/2019 Elsevier Patient Education  2022 [employer].

## 2020-12-29 NOTE — Progress Notes (Signed)
Cardiology Office Note:    Date:  12/30/2020   ID:  Martha Gilmore, DOB September 29, 1940, MRN [id number]  PCP:  Leamon Arnt, MD  Cardiologist:  Berniece Salines, DO  Electrophysiologist:  None   Referring MD: Leamon Arnt, MD   " I am nervous"  History of Present Illness:    Martha Gilmore is a 80 y.o. female with a hx of hyperlipidemia, overactive bladder, chronic major depression is here today to establish cardiac care.  Patient is here with her niece.  She tells me that recently she has been having some shortness of breath.  She had the EKG done at her primary care doctor office which was concerning therefore they placed a monitor and this monitor showed atrial flutter.  No chest pain.  She does admit to shortness of breath and fatigue.  Past Medical History:  Diagnosis Date   Cataracts, bilateral 09/23/2015   Clostridium difficile infection 04/16/2017   H/o C.diff due to clindamicyin 09/2016; two rounds of treatment for cure. Dr. Benson Norway   Depression    Hyperlipidemia    Hypertension    OAB (overactive bladder) 12/16/2013   Osteopenia 08/05/2012   Dexa scan 07/2012 mild osteopenia, T = -1.1   Seasonal allergies    Urge and stress incontinence     Past Surgical History:  Procedure Laterality Date   APPENDECTOMY  1971   CESAREAN SECTION     CHOLECYSTECTOMY     Teeth Implants     TRIGGER FINGER RELEASE     VAGINAL HYSTERECTOMY      Current Medications: Current Meds  Medication Sig   apixaban (ELIQUIS) 5 MG TABS tablet Take 1 tablet (5 mg total) by mouth 2 (two) times daily.   atorvastatin (LIPITOR) 10 MG tablet TAKE 1 TABLET BY MOUTH EVERYDAY AT BEDTIME   citalopram (CELEXA) 20 MG tablet Take 20 mg by mouth daily.   diltiazem (CARDIZEM CD) 120 MG 24 hr capsule Take 1 capsule (120 mg total) by mouth daily.   levothyroxine (SYNTHROID) 100 MCG tablet Take 1 tablet (100 mcg total) by mouth daily.   Multiple Vitamins-Minerals (PRESERVISION AREDS PO) Take by mouth.    omeprazole (PRILOSEC) 20 MG capsule TAKE 1 CAPSULE BY MOUTH EVERY DAY   oxybutynin (DITROPAN-XL) 10 MG 24 hr tablet TAKE ONE TABLET BY MOUTH DAILY AT BEDTIME   Polyethyl Glycol-Propyl Glycol (SYSTANE OP) Apply 1 drop to eye as needed (DRY EYES).     Allergies:   Aspirin, Meperidine, Penicillins, Statins, Cephalexin, Clindamycin/lincomycin, Demerol  [meperidine hcl], and Tape   Social History   Socioeconomic History   Marital status: Widowed    Spouse name: Not on file   Number of children: Not on file   Years of education: Not on file   Highest education level: Not on file  Occupational History   Not on file  Tobacco Use   Smoking status: Never   Smokeless tobacco: Never  Vaping Use   Vaping Use: Never used  Substance and Sexual Activity   Alcohol use: No   Drug use: No   Sexual activity: Never    Partners: Male  Other Topics Concern   Not on file  Social History Narrative   Not on file   Social Determinants of Health   Financial Resource Strain: Low Risk    Difficulty of Paying Living Expenses: Not hard at all  Food Insecurity: No Food Insecurity   Worried About [place] in the Last Year: Never true  Ran Out of Food in the Last Year: Never true  Transportation Needs: No Transportation Needs   Lack of Transportation (Medical): No   Lack of Transportation (Non-Medical): No  Physical Activity: Inactive   Days of Exercise per Week: 0 days   Minutes of Exercise per Session: 0 min  Stress: No Stress Concern Present   Feeling of Stress : Not at all  Social Connections: Moderately Integrated   Frequency of Communication with Friends and Family: More than three times a week   Frequency of Social Gatherings with Friends and Family: Once a week   Attends Religious Services: More than 4 times per year   Active Member of [employer] or Organizations: Yes   Attends Archivist Meetings: 1 to 4 times per year   Marital Status: Widowed     Family History: The  patient's family history includes Brain cancer in her brother; Cancer in her father; Diabetes in her sister; Heart disease in her father, mother, and sister; Hypertension in her mother; Kidney cancer in her sister; Lung cancer in her sister.  ROS:   Review of Systems  Constitution: Negative for decreased appetite, fever and weight gain.  HENT: Negative for congestion, ear discharge, hoarse voice and sore throat.   Eyes: Negative for discharge, redness, vision loss in right eye and visual halos.  Cardiovascular: Negative for chest pain, dyspnea on exertion, leg swelling, orthopnea and palpitations.  Respiratory: Negative for cough, hemoptysis, shortness of breath and snoring.   Endocrine: Negative for heat intolerance and polyphagia.  Hematologic/Lymphatic: Negative for bleeding problem. Does not bruise/bleed easily.  Skin: Negative for flushing, nail changes, rash and suspicious lesions.  Musculoskeletal: Negative for arthritis, joint pain, muscle cramps, myalgias, neck pain and stiffness.  Gastrointestinal: Negative for abdominal pain, bowel incontinence, diarrhea and excessive appetite.  Genitourinary: Negative for decreased libido, genital sores and incomplete emptying.  Neurological: Negative for brief paralysis, focal weakness, headaches and loss of balance.  Psychiatric/Behavioral: Negative for altered mental status, depression and suicidal ideas.  Allergic/Immunologic: Negative for HIV exposure and persistent infections.    EKGs/Labs/Other Studies Reviewed:    The following studies were reviewed today:   EKG:  The ekg ordered today demonstrates on November 26, 2020 atrial fibrillation with controlled ventricular rate.  Recent Labs: 11/26/2020: ALT 16; BUN 22; Creatinine, Ser 1.04; Hemoglobin 14.4; Platelets 309.0; Potassium 4.0; Sodium 138; TSH 1.10  Recent Lipid Panel    Component Value Date/Time   CHOL 212 (H) 11/26/2020 1019   TRIG 83.0 11/26/2020 1019   HDL 65.10 11/26/2020  1019   CHOLHDL 3 11/26/2020 1019   VLDL 16.6 11/26/2020 1019   LDLCALC 130 (H) 11/26/2020 1019    Physical Exam:    VS:  BP (!) 142/84 (BP Location: Right Arm, Patient Position: Sitting, Cuff Size: Normal)   Pulse (!) 116   Ht 5' (1.524 m)   Wt 170 lb (77.1 kg)   SpO2 98%   BMI 33.20 kg/m     Wt Readings from Last 3 Encounters:  12/29/20 170 lb (77.1 kg)  11/26/20 167 lb 8 oz (76 kg)  10/27/20 170 lb (77.1 kg)     GEN: Well nourished, well developed in no acute distress HEENT: Normal NECK: No JVD; No carotid bruits LYMPHATICS: No lymphadenopathy CARDIAC: S1S2 noted,RRR, 2/6 mid-to-late systolic ejection and 1 out of 6 systolic murmurs, rubs, gallops RESPIRATORY:  Clear to auscultation without rales, wheezing or rhonchi  ABDOMEN: Soft, non-tender, non-distended, +bowel sounds, no guarding. EXTREMITIES: No edema,  No cyanosis, no clubbing MUSCULOSKELETAL:  No deformity  SKIN: Warm and dry NEUROLOGIC:  Alert and oriented x 3, non-focal PSYCHIATRIC:  Normal affect, good insight  ASSESSMENT:    1. Atrial flutter, unspecified type ([place])   2. Atrial fibrillation, unspecified type ([place])   3. Murmur, cardiac   4. SOB (shortness of breath)   5. Fatigue, unspecified type    PLAN:    She recently was diagnosed with atrial flutter.  Review her recent monitor which showed continuous atrial arrhythmia and/atrial flutter the whole time the patient wore this for 3 days.  She was started on Cardizem by her PCP which she has not started.  I encouraged the patient that we need to start the Cardizem to help her with rate control hopefully we can slow her rate down were able to have her spontaneously converted.  In the meantime her CHA2DS2-VASc score is 15 (age greater than 75, female sex-and there is also questionable hypertension that if at that she is 5).  I like to start the patient on anticoagulation for stroke prevention.  Educated the patient about this.  She is agreeable to proceed with  this. For her in terms of her atrial flutter was able to talk to the patient and her niece about the planning for treatment which include rate control, rhythm control and stroke prevention.  Once I have more information and determine at her follow-up visit she may need to be evaluated by EP.  She does have shortness of breath and fatigue.  I suspect that her shortness of breath is in the setting of her atrial flutter.  But with the fatigue I will like to have the patient undergo a sleep study to rule out sleep apnea.  She has a delayed mid-to-late systolic ejection murmur and a very low pitched 1 out of 6 diastolic murmur.  Get an echocardiogram to reassess for any significant valvular abnormalities.  Hyperlipidemia - continue with current statin medication.  The patient is in agreement with the above plan. The patient left the office in stable condition.  The patient will follow up in 3 months or sooner if needed.  Medication Adjustments/Labs and Tests Ordered: Current medicines are reviewed at length with the patient today.  Concerns regarding medicines are outlined above.  Orders Placed This Encounter  Procedures   ECHOCARDIOGRAM COMPLETE   Split night study   Meds ordered this encounter  Medications   apixaban (ELIQUIS) 5 MG TABS tablet    Sig: Take 1 tablet (5 mg total) by mouth 2 (two) times daily.    Dispense:  180 tablet    Refill:  3    Patient Instructions  Medication Instructions:  Your physician has recommended you make the following change in your medication:  START: Eliquis 5 mg twice daily *If you need a refill on your cardiac medications before your next appointment, please call your pharmacy*   Lab Work: None If you have labs (blood work) drawn today and your tests are completely normal, you will receive your results only by: [facility] (if you have MyChart) OR A paper copy in the mail If you have any lab test that is abnormal or we need to change your  treatment, we will call you to review the results.   Testing/Procedures: Your physician has recommended that you have a sleep study. This test records several body functions during sleep, including: brain activity, eye movement, oxygen and carbon dioxide blood levels, heart rate and rhythm, breathing rate and  rhythm, the flow of air through your mouth and nose, snoring, body muscle movements, and chest and belly movement.  Your physician has requested that you have an echocardiogram. Echocardiography is a painless test that uses sound waves to create images of your heart. It provides your doctor with information about the size and shape of your heart and how well your heart's chambers and valves are working. This procedure takes approximately one hour. There are no restrictions for this procedure.    Follow-Up: At [facility], you and your health needs are our priority.  As part of our continuing mission to provide you with exceptional heart care, we have created designated Provider Care Teams.  These Care Teams include your primary Cardiologist (physician) and Advanced Practice Providers (APPs -  Physician Assistants and Nurse Practitioners) who all work together to provide you with the care you need, when you need it.  We recommend signing up for the patient portal called "MyChart".  Sign up information is provided on this After Visit Summary.  MyChart is used to connect with patients for Virtual Visits (Telemedicine).  Patients are able to view lab/test results, encounter notes, upcoming appointments, etc.  Non-urgent messages can be sent to your provider as well.   To learn more about what you can do with MyChart, go to [email or web address].    Your next appointment:   3 month(s)  The format for your next appointment:   In Person  Provider:   Berniece Salines, DO [address], [place], [place] [address]    Other Instructions Echocardiogram An echocardiogram is a test that uses  sound waves (ultrasound) to produce images of the heart. Images from an echocardiogram can provide important information about: Heart size and shape. The size and thickness and movement of your heart's walls. Heart muscle function and strength. Heart valve function or if you have stenosis. Stenosis is when the heart valves are too narrow. If blood is flowing backward through the heart valves (regurgitation). A tumor or infectious growth around the heart valves. Areas of heart muscle that are not working well because of poor blood flow or injury from a heart attack. Aneurysm detection. An aneurysm is a weak or damaged part of an artery wall. The wall bulges out from the normal force of blood pumping through the body. Tell a health care provider about: Any allergies you have. All medicines you are taking, including vitamins, herbs, eye drops, creams, and over-the-counter medicines. Any blood disorders you have. Any surgeries you have had. Any medical conditions you have. Whether you are pregnant or may be pregnant. What are the risks? Generally, this is a safe test. However, problems may occur, including an allergic reaction to dye (contrast) that may be used during the test. What happens before the test? No specific preparation is needed. You may eat and drink normally. What happens during the test?  You will take off your clothes from the waist up and put on a hospital gown. Electrodes or electrocardiogram (ECG)patches may be placed on your chest. The electrodes or patches are then connected to a device that monitors your heart rate and rhythm. You will lie down on a table for an ultrasound exam. A gel will be applied to your chest to help sound waves pass through your skin. A handheld device, called a transducer, will be pressed against your chest and moved over your heart. The transducer produces sound waves that travel to your heart and bounce back (or "echo" back) to the transducer.  These sound waves will be captured in real-time and changed into images of your heart that can be viewed on a video monitor. The images will be recorded on a computer and reviewed by your health care provider. You may be asked to change positions or hold your breath for a short time. This makes it easier to get different views or better views of your heart. In some cases, you may receive contrast through an IV in one of your veins. This can improve the quality of the pictures from your heart. The procedure may vary among health care providers and hospitals. What can I expect after the test? You may return to your normal, everyday life, including diet, activities, and medicines, unless your health care provider tells you not to do that. Follow these instructions at home: It is up to you to get the results of your test. Ask your health care provider, or the department that is doing the test, when your results will be ready. Keep all follow-up visits. This is important. Summary An echocardiogram is a test that uses sound waves (ultrasound) to produce images of the heart. Images from an echocardiogram can provide important information about the size and shape of your heart, heart muscle function, heart valve function, and other possible heart problems. You do not need to do anything to prepare before this test. You may eat and drink normally. After the echocardiogram is completed, you may return to your normal, everyday life, unless your health care provider tells you not to do that. This information is not intended to replace advice given to you by your health care provider. Make sure you discuss any questions you have with your health care provider. Document Revised: 12/02/2019 Document Reviewed: 12/02/2019 Elsevier Patient Education  2022 [place].     Adopting a Healthy Lifestyle.  Know what a healthy weight is for you (roughly BMI <25) and aim to maintain this   Aim for 7+ servings of  fruits and vegetables daily   65-80+ fluid ounces of water or unsweet tea for healthy kidneys   Limit to max 1 drink of alcohol per day; avoid smoking/tobacco   Limit animal fats in diet for cholesterol and heart health - choose grass fed whenever available   Avoid highly processed foods, and foods high in saturated/trans fats   Aim for low stress - take time to unwind and care for your mental health   Aim for 150 min of moderate intensity exercise weekly for heart health, and weights twice weekly for bone health   Aim for 7-9 hours of sleep daily   When it comes to diets, agreement about the perfect plan isnt easy to find, even among the experts. Experts at the [employer] developed an idea known as the Healthy Eating Plate. Just imagine a plate divided into logical, healthy portions.   The emphasis is on diet quality:   Load up on vegetables and fruits - one-half of your plate: Aim for color and variety, and remember that potatoes dont count.   Go for whole grains - one-quarter of your plate: Whole wheat, barley, wheat berries, quinoa, oats, brown rice, and foods made with them. If you want pasta, go with whole wheat pasta.   Protein power - one-quarter of your plate: Fish, chicken, beans, and nuts are all healthy, versatile protein sources. Limit red meat.   The diet, however, does go beyond the plate, offering a few other suggestions.   Use healthy plant  oils, such as olive, canola, soy, corn, sunflower and peanut. Check the labels, and avoid partially hydrogenated oil, which have unhealthy trans fats.   If youre thirsty, drink water. Coffee and tea are good in moderation, but skip sugary drinks and limit milk and dairy products to one or two daily servings.   The type of carbohydrate in the diet is more important than the amount. Some sources of carbohydrates, such as vegetables, fruits, whole grains, and beans-are healthier than others.   Finally, stay  active  Signed, Berniece Salines, DO  12/30/2020 8:09 AM    [place]

## 2020-12-30 ENCOUNTER — Encounter: Payer: Self-pay | Admitting: Family Medicine

## 2020-12-30 DIAGNOSIS — I4892 Unspecified atrial flutter: Secondary | ICD-10-CM

## 2020-12-30 HISTORY — DX: Unspecified atrial flutter: I48.92

## 2020-12-30 NOTE — Progress Notes (Signed)
Reviewed report/notes and updated pt's chart/history/PL and/or HM accordingly. 

## 2021-01-10 ENCOUNTER — Telehealth: Payer: Self-pay

## 2021-01-10 DIAGNOSIS — M9902 Segmental and somatic dysfunction of thoracic region: Secondary | ICD-10-CM | POA: Diagnosis not present

## 2021-01-10 DIAGNOSIS — M5032 Other cervical disc degeneration, mid-cervical region, unspecified level: Secondary | ICD-10-CM | POA: Diagnosis not present

## 2021-01-10 DIAGNOSIS — M9903 Segmental and somatic dysfunction of lumbar region: Secondary | ICD-10-CM | POA: Diagnosis not present

## 2021-01-10 DIAGNOSIS — M9901 Segmental and somatic dysfunction of cervical region: Secondary | ICD-10-CM | POA: Diagnosis not present

## 2021-01-10 DIAGNOSIS — S29012A Strain of muscle and tendon of back wall of thorax, initial encounter: Secondary | ICD-10-CM | POA: Diagnosis not present

## 2021-01-10 DIAGNOSIS — M5136 Other intervertebral disc degeneration, lumbar region: Secondary | ICD-10-CM | POA: Diagnosis not present

## 2021-01-10 NOTE — Telephone Encounter (Signed)
Called pt to let her know her sleep study appt is November 11th at 8pm. She is aware the sleep study facility will be sending information to the address we have on file.

## 2021-01-13 DIAGNOSIS — M9902 Segmental and somatic dysfunction of thoracic region: Secondary | ICD-10-CM | POA: Diagnosis not present

## 2021-01-13 DIAGNOSIS — M5136 Other intervertebral disc degeneration, lumbar region: Secondary | ICD-10-CM | POA: Diagnosis not present

## 2021-01-13 DIAGNOSIS — M9901 Segmental and somatic dysfunction of cervical region: Secondary | ICD-10-CM | POA: Diagnosis not present

## 2021-01-13 DIAGNOSIS — M5032 Other cervical disc degeneration, mid-cervical region, unspecified level: Secondary | ICD-10-CM | POA: Diagnosis not present

## 2021-01-13 DIAGNOSIS — S29012A Strain of muscle and tendon of back wall of thorax, initial encounter: Secondary | ICD-10-CM | POA: Diagnosis not present

## 2021-01-13 DIAGNOSIS — M9903 Segmental and somatic dysfunction of lumbar region: Secondary | ICD-10-CM | POA: Diagnosis not present

## 2021-01-14 ENCOUNTER — Other Ambulatory Visit: Payer: Self-pay | Admitting: Family Medicine

## 2021-01-14 DIAGNOSIS — Z23 Encounter for immunization: Secondary | ICD-10-CM | POA: Diagnosis not present

## 2021-01-14 DIAGNOSIS — N3281 Overactive bladder: Secondary | ICD-10-CM

## 2021-01-18 ENCOUNTER — Ambulatory Visit (HOSPITAL_COMMUNITY): Payer: Medicare Other | Attending: Internal Medicine

## 2021-01-18 ENCOUNTER — Other Ambulatory Visit: Payer: Self-pay

## 2021-01-18 ENCOUNTER — Encounter: Payer: Self-pay | Admitting: Cardiology

## 2021-01-18 DIAGNOSIS — R011 Cardiac murmur, unspecified: Secondary | ICD-10-CM | POA: Diagnosis not present

## 2021-01-18 DIAGNOSIS — R0602 Shortness of breath: Secondary | ICD-10-CM

## 2021-01-18 DIAGNOSIS — I351 Nonrheumatic aortic (valve) insufficiency: Secondary | ICD-10-CM | POA: Insufficient documentation

## 2021-01-18 LAB — ECHOCARDIOGRAM COMPLETE
AR max vel: 1.68 cm2
AV Area VTI: 1.88 cm2
AV Area mean vel: 1.83 cm2
AV Mean grad: 11 mmHg
AV Peak grad: 19.4 mmHg
Ao pk vel: 2.2 m/s
S' Lateral: 3 cm

## 2021-01-19 DIAGNOSIS — M9902 Segmental and somatic dysfunction of thoracic region: Secondary | ICD-10-CM | POA: Diagnosis not present

## 2021-01-19 DIAGNOSIS — S29012A Strain of muscle and tendon of back wall of thorax, initial encounter: Secondary | ICD-10-CM | POA: Diagnosis not present

## 2021-01-19 DIAGNOSIS — M5136 Other intervertebral disc degeneration, lumbar region: Secondary | ICD-10-CM | POA: Diagnosis not present

## 2021-01-19 DIAGNOSIS — M9903 Segmental and somatic dysfunction of lumbar region: Secondary | ICD-10-CM | POA: Diagnosis not present

## 2021-01-19 DIAGNOSIS — M9901 Segmental and somatic dysfunction of cervical region: Secondary | ICD-10-CM | POA: Diagnosis not present

## 2021-01-19 DIAGNOSIS — M5032 Other cervical disc degeneration, mid-cervical region, unspecified level: Secondary | ICD-10-CM | POA: Diagnosis not present

## 2021-01-20 DIAGNOSIS — M47812 Spondylosis without myelopathy or radiculopathy, cervical region: Secondary | ICD-10-CM | POA: Diagnosis not present

## 2021-02-01 DIAGNOSIS — M5136 Other intervertebral disc degeneration, lumbar region: Secondary | ICD-10-CM | POA: Diagnosis not present

## 2021-02-01 DIAGNOSIS — M9901 Segmental and somatic dysfunction of cervical region: Secondary | ICD-10-CM | POA: Diagnosis not present

## 2021-02-01 DIAGNOSIS — S29012A Strain of muscle and tendon of back wall of thorax, initial encounter: Secondary | ICD-10-CM | POA: Diagnosis not present

## 2021-02-01 DIAGNOSIS — M9903 Segmental and somatic dysfunction of lumbar region: Secondary | ICD-10-CM | POA: Diagnosis not present

## 2021-02-01 DIAGNOSIS — M9902 Segmental and somatic dysfunction of thoracic region: Secondary | ICD-10-CM | POA: Diagnosis not present

## 2021-02-01 DIAGNOSIS — M5032 Other cervical disc degeneration, mid-cervical region, unspecified level: Secondary | ICD-10-CM | POA: Diagnosis not present

## 2021-02-08 ENCOUNTER — Ambulatory Visit: Payer: Medicare Other | Admitting: Cardiology

## 2021-02-08 DIAGNOSIS — M5032 Other cervical disc degeneration, mid-cervical region, unspecified level: Secondary | ICD-10-CM | POA: Diagnosis not present

## 2021-02-08 DIAGNOSIS — M9903 Segmental and somatic dysfunction of lumbar region: Secondary | ICD-10-CM | POA: Diagnosis not present

## 2021-02-08 DIAGNOSIS — S29012A Strain of muscle and tendon of back wall of thorax, initial encounter: Secondary | ICD-10-CM | POA: Diagnosis not present

## 2021-02-08 DIAGNOSIS — M5136 Other intervertebral disc degeneration, lumbar region: Secondary | ICD-10-CM | POA: Diagnosis not present

## 2021-02-08 DIAGNOSIS — M9901 Segmental and somatic dysfunction of cervical region: Secondary | ICD-10-CM | POA: Diagnosis not present

## 2021-02-08 DIAGNOSIS — M9902 Segmental and somatic dysfunction of thoracic region: Secondary | ICD-10-CM | POA: Diagnosis not present

## 2021-02-14 DIAGNOSIS — M47812 Spondylosis without myelopathy or radiculopathy, cervical region: Secondary | ICD-10-CM | POA: Diagnosis not present

## 2021-02-14 DIAGNOSIS — I10 Essential (primary) hypertension: Secondary | ICD-10-CM | POA: Diagnosis not present

## 2021-02-14 DIAGNOSIS — Z6833 Body mass index (BMI) 33.0-33.9, adult: Secondary | ICD-10-CM | POA: Diagnosis not present

## 2021-02-18 ENCOUNTER — Other Ambulatory Visit: Payer: Self-pay | Admitting: Neurosurgery

## 2021-02-18 DIAGNOSIS — M47812 Spondylosis without myelopathy or radiculopathy, cervical region: Secondary | ICD-10-CM

## 2021-02-24 ENCOUNTER — Ambulatory Visit
Admission: RE | Admit: 2021-02-24 | Discharge: 2021-02-24 | Disposition: A | Discharge status: Home / Self Care | Payer: Medicare Other | Source: Ambulatory Visit | Attending: Neurosurgery | Admitting: Neurosurgery

## 2021-02-24 DIAGNOSIS — G9519 Other vascular myelopathies: Secondary | ICD-10-CM | POA: Diagnosis not present

## 2021-02-24 DIAGNOSIS — M5021 Other cervical disc displacement,  high cervical region: Secondary | ICD-10-CM | POA: Diagnosis not present

## 2021-02-24 DIAGNOSIS — M4319 Spondylolisthesis, multiple sites in spine: Secondary | ICD-10-CM | POA: Diagnosis not present

## 2021-02-24 DIAGNOSIS — M47813 Spondylosis without myelopathy or radiculopathy, cervicothoracic region: Secondary | ICD-10-CM | POA: Diagnosis not present

## 2021-02-24 DIAGNOSIS — M47812 Spondylosis without myelopathy or radiculopathy, cervical region: Secondary | ICD-10-CM

## 2021-02-24 IMAGING — MR MR CERVICAL SPINE W/O CM
4 of 5 series · 27 of 48 positions shown · non-contrast
Comparison: MRI of the cervical spine [DATE]

CLINICAL DATA: Spondylosis without myelopathy or radiculopathy,
cervical region.

EXAM:
MRI CERVICAL SPINE WITHOUT CONTRAST
TECHNIQUE: Multiplanar, multisequence MR imaging of the cervical spine was
performed. No intravenous contrast was administered.

[Series 2: T2 · sagittal · 3.0mm · 0.66mm/px · 6 of 13 slices shown (1 of 2)]
[im 1/13]
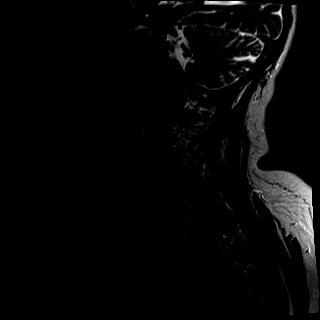
[im 3/13]
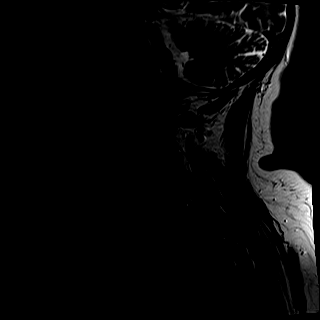
[im 5/13]
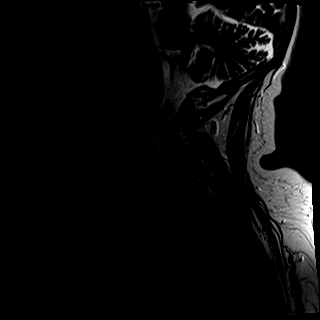
[im 8/13]
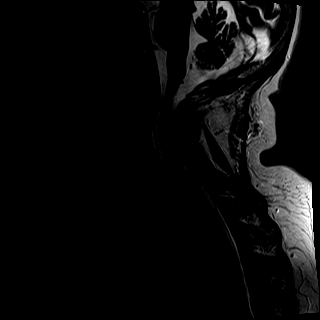
[im 10/13]
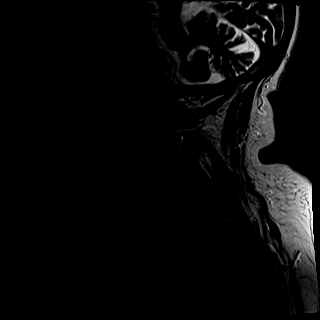
[im 13/13]
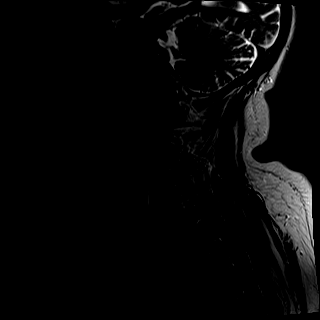

[Series 3: T1 · sagittal · 3.0mm · 0.41mm/px · 7 of 13 slices shown]
[im 1/13]
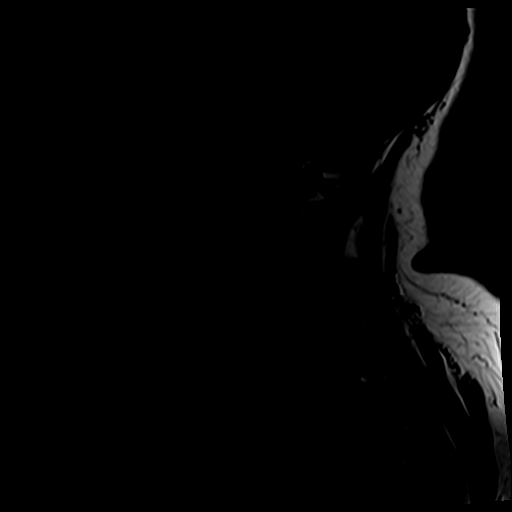
[im 3/13]
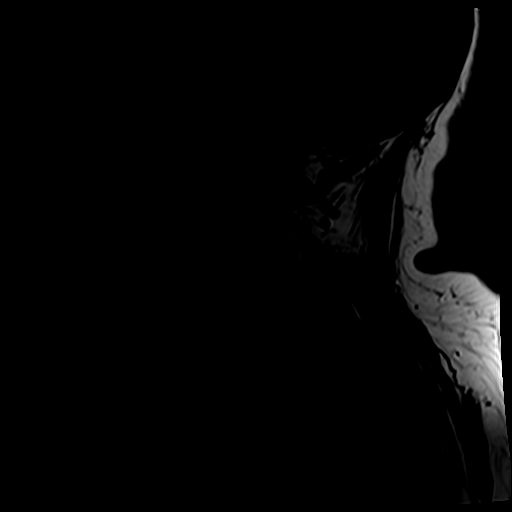
[im 5/13]
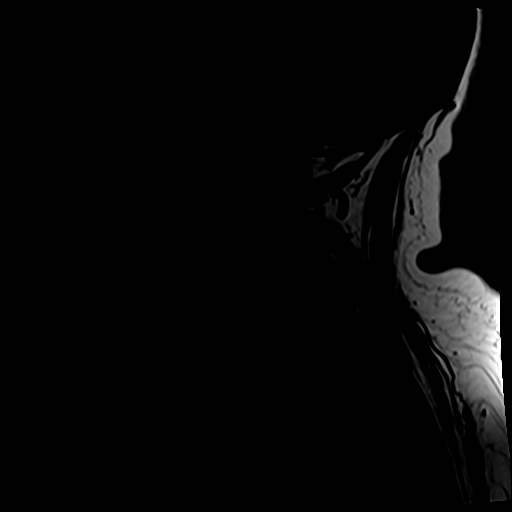
[im 7/13]
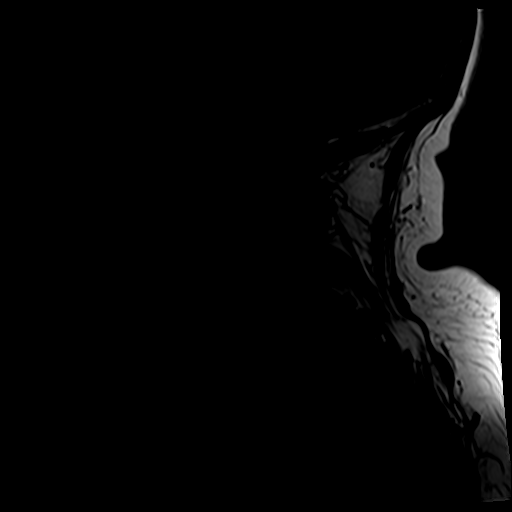
[im 9/13]
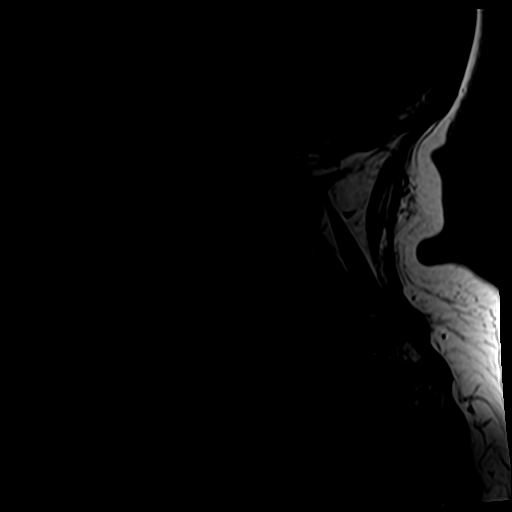
[im 11/13]
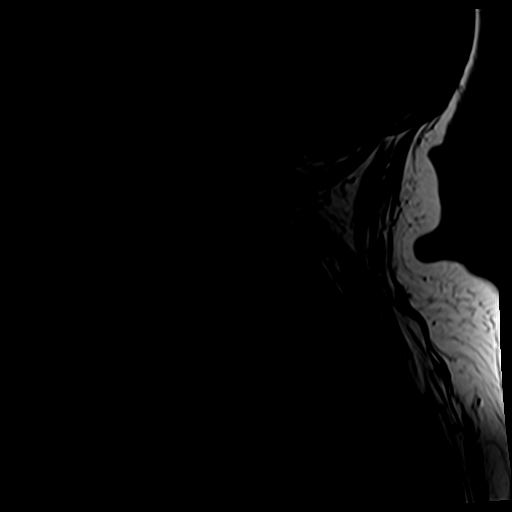
[im 13/13]
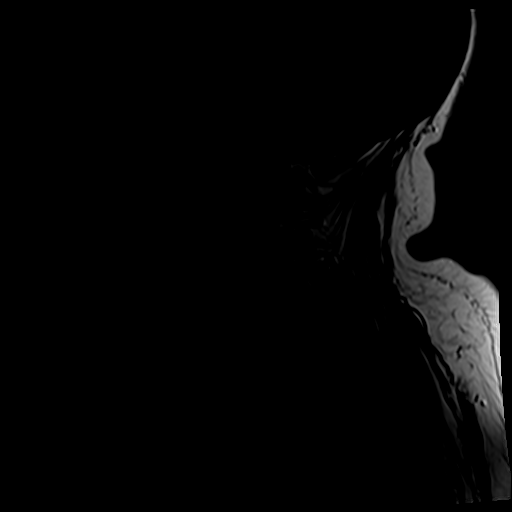

[Series 4: tir sag · sagittal · 3.0mm · 0.41mm/px · 6 of 13 slices shown]
[im 1/13]
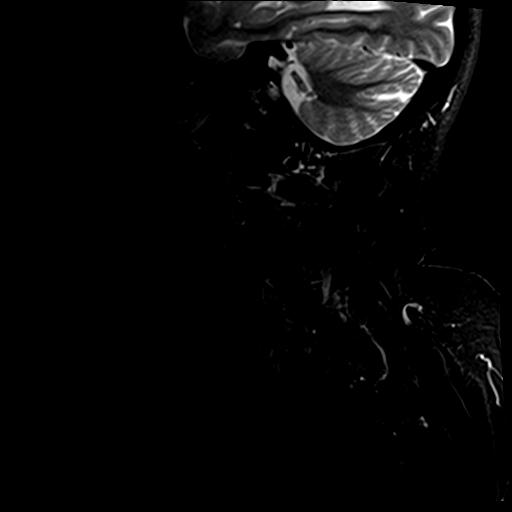
[im 3/13]
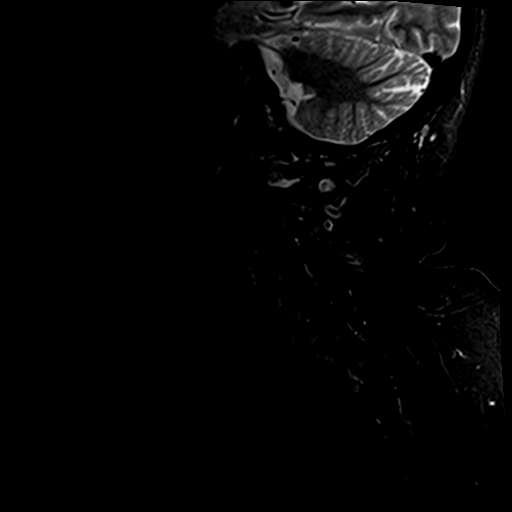
[im 5/13]
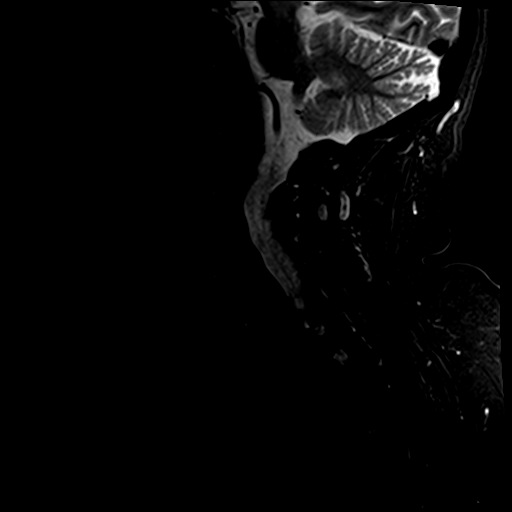
[im 7/13]
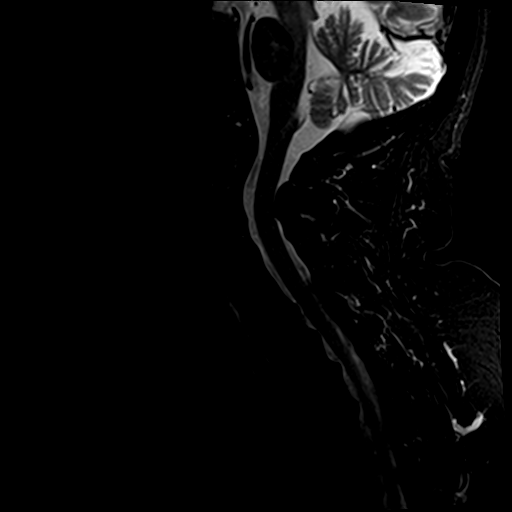
[im 9/13]
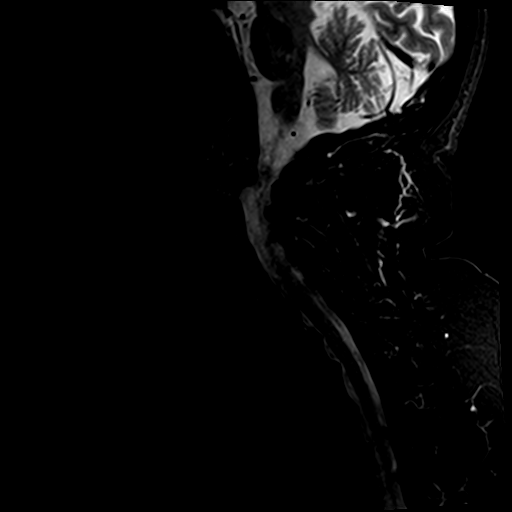
[im 11/13]
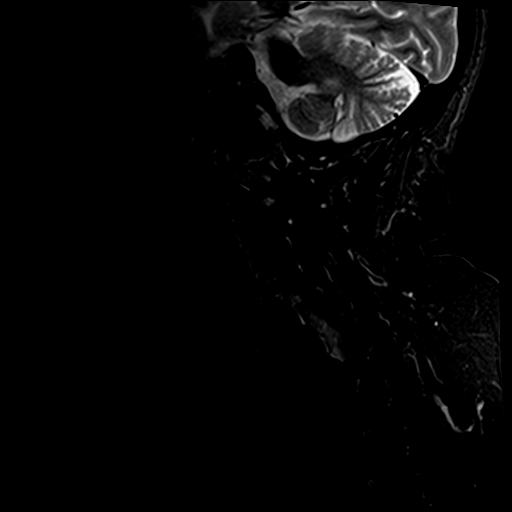

[Series 6: T2 · axial · 3.0mm · 0.70mm/px · z∈[-138,-47]mm · 8 of 27 slices shown (2 of 2)]
[im 1/27]
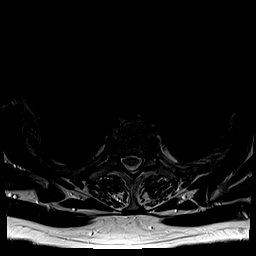
[im 5/27]
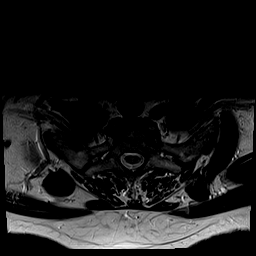
[im 9/27]
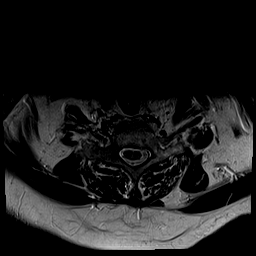
[im 13/27]
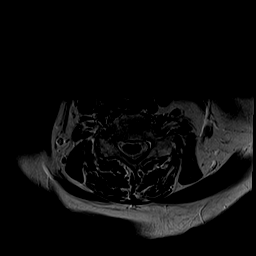
[im 15/27]
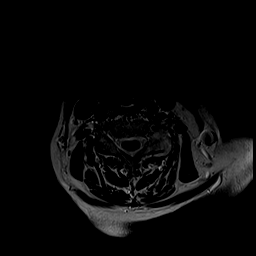
[im 19/27]
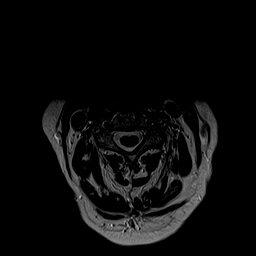
[im 23/27]
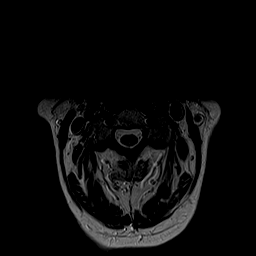
[im 27/27]
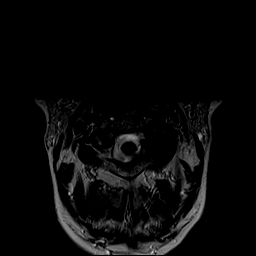

[27 of 48 positions shown; findings below may reference images not displayed]

FINDINGS: Alignment: Mild anterolisthesis at C3-4, C5-6, C6-7 and C7-T1.

Vertebrae: Interval improvement with mild residual marrow edema at
C1 and C2 and C7-T1 right facet articular processes. No new areas of
marrow edema identified. No evidence of discitis or aggressive bone
lesion. Facet fusion and incomplete endplate fusion noted from C4
through C7. Endplate fusion is also noted at T1-2.

Cord: Normal signal and morphology.

Posterior Fossa, vertebral arteries, paraspinal tissues: Negative.

Disc levels:

C1-2: Increased thickness of the soft tissues posterior to the
odontoid. Facet osteoarthritis resulting in narrowing of the
bilateral neural foramen, left greater than right.

C2-3: Small posterior disc protrusion without significant spinal
canal stenosis. Facet degenerative changes resulting in mild left
neural foraminal narrowing.

C3-4: Small posterior disc protrusion without significant spinal
canal stenosis. Uncovertebral and hypertrophic facet degenerative
changes resulting in moderate right and mild left neural foraminal
narrowing.

C4-5: Hypertrophic facet degenerative changes. No significant spinal
canal or neural foraminal stenosis.

C5-6: Posterior disc osteophyte complex and facet ankylosis. No
significant spinal canal or neural foraminal stenosis.

C6-7: Posterior disc osteophyte complex and facet ankylosis. No
significant spinal canal or neural foraminal stenosis.

C7-T1: Posterior disc osteophyte complex and facet degenerative
changes resulting in mild left neural foraminal narrowing.
IMPRESSION: Interval improvement of the marrow edema involving C1 and C2 and
right C7-T1 facet joint with mild residual edema. Otherwise, stable
spondylosis when compared to prior MRI.

## 2021-03-02 ENCOUNTER — Encounter: Payer: Self-pay | Admitting: Family Medicine

## 2021-03-02 ENCOUNTER — Other Ambulatory Visit: Payer: Self-pay

## 2021-03-02 ENCOUNTER — Ambulatory Visit (INDEPENDENT_AMBULATORY_CARE_PROVIDER_SITE_OTHER): Payer: Medicare Other | Admitting: Family Medicine

## 2021-03-02 ENCOUNTER — Ambulatory Visit: Payer: Medicare Other | Admitting: Family Medicine

## 2021-03-02 VITALS — BP 108/70 | HR 69 | Temp 98.0°F | Ht 60.0 in | Wt 173.6 lb

## 2021-03-02 DIAGNOSIS — M4722 Other spondylosis with radiculopathy, cervical region: Secondary | ICD-10-CM

## 2021-03-02 MED ORDER — TRAMADOL HCL 50 MG PO TABS: 50.0000 mg | ORAL_TABLET | Freq: Three times a day (TID) | ORAL | 2 refills | Status: DC | PRN

## 2021-03-02 MED ORDER — GABAPENTIN 600 MG PO TABS: 300.0000 mg | ORAL_TABLET | Freq: Every day | ORAL | 3 refills | Status: DC

## 2021-03-02 NOTE — Patient Instructions (Signed)
Please follow up as scheduled for your next visit with me: 05/02/2021 , sooner if your neck pain is not improving with the medications.   If you have any questions or concerns, please don't hesitate to send me a message via MyChart or call the office at [phone]. Thank you for visiting with Martha Gilmore today! It's our pleasure caring for you.

## 2021-03-02 NOTE — Progress Notes (Signed)
Subjective  CC:  Chief Complaint  Patient presents with   Arthritis    Neck, has done PT, injections, has had no relief. Both sides, left is worse    HPI: Martha Gilmore is a 80 y.o. female who presents to the office today to address the problems listed above in the chief complaint. 80 year old female here for persistently chronic neck pain.  Has been evaluated by sports medicine and neurosurgery.  I reviewed multiple records.  Reviewed 2 MRI reports.  She has had conservative care, Cymbalta, NSAIDs, Tylenol and steroid injections without relief.  She is currently seeing an Advertising account executive.  However she continues to have daily and nightly pain.  Keeps her awake at night.  She is exhausted from pain.  From chart review, she has not been treated with other medications.  No new radicular symptoms but she does complain of having a tingling electric sensation that goes up the backside of her left hand.  No diplopia, dysarthria or upper extremity symptoms.  She does have significant osteoarthritis and cervical spines.  She has had lumbar fusion in the lower spine.  Assessment  1. Osteoarthritis of spine with radiculopathy, cervical region      Plan  Arthritis causing disabling neck pain, chronic: Has failed multiple conservative measures.  She does have follow-up with neurosurgery.  Recommend trial of tramadol, Neurontin nightly.  Can add muscle relaxer if needed.  If no relief, she will return and we will try other pain medications.  Follow-up with neurosurgery. I independently reviewed MRIs, management strategies per sports medicine and neurosurgery.  Medical therapy recommended.  Follow up: As scheduled, sooner if neck pain is not relieved 05/02/2021  No orders of the defined types were placed in this encounter.  Meds ordered this encounter  Medications   traMADol (ULTRAM) 50 MG tablet    Sig: Take 1 tablet (50 mg total) by mouth 3 (three) times daily as needed.     Dispense:  60 tablet    Refill:  2   gabapentin (NEURONTIN) 600 MG tablet    Sig: Take 0.5 tablets (300 mg total) by mouth at bedtime.    Dispense:  45 tablet    Refill:  3      I reviewed the patients updated PMH, FH, and SocHx.    Patient Active Problem List   Diagnosis Date Noted   Atrial flutter ([place]) 12/30/2020    Priority: High   Postablative hypothyroidism 12/20/2015    Priority: High   Chondromalacia, knee, right 11/05/2017    Priority: Medium    Osteoarthrosis, localized, primary, knee, right 11/05/2017    Priority: Medium    Major depression, chronic 04/16/2017    Priority: Medium    Mixed hyperlipidemia 04/16/2017    Priority: Medium    History of Graves' disease 03/22/2015    Priority: Medium    GERD (gastroesophageal reflux disease) 12/13/2012    Priority: Medium    Osteopenia 08/05/2012    Priority: Medium    DDD (degenerative disc disease), lumbosacral 10/22/2010    Priority: Medium    Bilateral sensorineural hearing loss 02/06/2016    Priority: Low   OAB (overactive bladder) 12/16/2013    Priority: Low   Osteoarthritis of CMC joint of thumb 08/13/2012    Priority: Low   Seasonal allergies 08/13/2012    Priority: Low   Urge and stress incontinence 08/13/2012    Priority: Low   Aortic insufficiency 01/18/2021   Positive ANA (antinuclear antibody) 10/26/2020  Cervical spine ankylosis 10/26/2020   Facet degeneration of lumbar region 01/06/2020   Clostridium difficile infection 04/16/2017   Family history of early CAD 04/16/2017   Cataracts, bilateral 09/23/2015   Current Meds  Medication Sig   apixaban (ELIQUIS) 5 MG TABS tablet Take 1 tablet (5 mg total) by mouth 2 (two) times daily.   atorvastatin (LIPITOR) 10 MG tablet TAKE 1 TABLET BY MOUTH EVERYDAY AT BEDTIME   citalopram (CELEXA) 20 MG tablet Take 20 mg by mouth daily.   diltiazem (CARDIZEM CD) 120 MG 24 hr capsule Take 1 capsule (120 mg total) by mouth daily.   gabapentin (NEURONTIN) 600 MG  tablet Take 0.5 tablets (300 mg total) by mouth at bedtime.   levothyroxine (SYNTHROID) 100 MCG tablet Take 1 tablet (100 mcg total) by mouth daily.   Multiple Vitamins-Minerals (PRESERVISION AREDS PO) Take by mouth.   omeprazole (PRILOSEC) 20 MG capsule TAKE 1 CAPSULE BY MOUTH EVERY DAY   oxybutynin (DITROPAN-XL) 10 MG 24 hr tablet TAKE ONE TABLET BY MOUTH DAILY AT BEDTIME   Polyethyl Glycol-Propyl Glycol (SYSTANE OP) Apply 1 drop to eye as needed (DRY EYES).   traMADol (ULTRAM) 50 MG tablet Take 1 tablet (50 mg total) by mouth 3 (three) times daily as needed.    Allergies: Patient is allergic to aspirin, meperidine, penicillins, statins, cephalexin, clindamycin/lincomycin, demerol  [meperidine hcl], and tape. Family History: Patient family history includes Brain cancer in her brother; Cancer in her father; Diabetes in her sister; Heart disease in her father, mother, and sister; Hypertension in her mother; Kidney cancer in her sister; Lung cancer in her sister. Social History:  Patient  reports that she has never smoked. She has never used smokeless tobacco. She reports that she does not drink alcohol and does not use drugs.  Review of Systems: Constitutional: Negative for fever malaise or anorexia Cardiovascular: negative for chest pain Respiratory: negative for SOB or persistent cough Gastrointestinal: negative for abdominal pain  Objective  Vitals: BP 108/70   Pulse 69   Temp 98 F (36.7 C) (Temporal)   Ht 5' (1.524 m)   Wt 173 lb 9.6 oz (78.7 kg)   SpO2 100%   BMI 33.90 kg/m  General: no acute distress , A&Ox3   Commons side effects, risks, benefits, and alternatives for medications and treatment plan prescribed today were discussed, and the patient expressed understanding of the given instructions. Patient is instructed to call or message via MyChart if he/she has any questions or concerns regarding our treatment plan. No barriers to understanding were identified. We  discussed Red Flag symptoms and signs in detail. Patient expressed understanding regarding what to do in case of urgent or emergency type symptoms.  Medication list was reconciled, printed and provided to the patient in AVS. Patient instructions and summary information was reviewed with the patient as documented in the AVS. This note was prepared with assistance of Dragon voice recognition software. Occasional wrong-word or sound-a-like substitutions may have occurred due to the inherent limitations of voice recognition software  This visit occurred during the SARS-CoV-2 public health emergency.  Safety protocols were in place, including screening questions prior to the visit, additional usage of staff PPE, and extensive cleaning of exam room while observing appropriate contact time as indicated for disinfecting solutions.

## 2021-03-04 ENCOUNTER — Other Ambulatory Visit: Payer: Self-pay

## 2021-03-04 ENCOUNTER — Ambulatory Visit (HOSPITAL_BASED_OUTPATIENT_CLINIC_OR_DEPARTMENT_OTHER): Payer: Medicare Other | Attending: Cardiology | Admitting: Cardiology

## 2021-03-04 DIAGNOSIS — R5383 Other fatigue: Secondary | ICD-10-CM

## 2021-03-04 DIAGNOSIS — G4733 Obstructive sleep apnea (adult) (pediatric): Secondary | ICD-10-CM | POA: Diagnosis not present

## 2021-03-04 DIAGNOSIS — I4891 Unspecified atrial fibrillation: Secondary | ICD-10-CM | POA: Diagnosis not present

## 2021-03-04 DIAGNOSIS — G4736 Sleep related hypoventilation in conditions classified elsewhere: Secondary | ICD-10-CM | POA: Insufficient documentation

## 2021-03-15 ENCOUNTER — Other Ambulatory Visit: Payer: Self-pay | Admitting: Family Medicine

## 2021-03-21 NOTE — Procedures (Signed)
     Patient Name: Martha Gilmore, Martha Gilmore Study Date:03/04/2021 Gender: Female D.O.B: 09/12/1940 Age (years): 76 Referring Provider: Fransico Him MD, ABSM Height (inches): 60 Interpreting Physician: Fransico Him MD, ABSM Weight (lbs): 170 RPSGT: Earney [place] BMI: 33 MRN: [id number] Neck Size: 15.00  CLINICAL INFORMATION Sleep Study Type: NPSG  Indication for sleep study: N/A  Epworth Sleepiness Score: 6  SLEEP STUDY TECHNIQUE As per the AASM Manual for the Scoring of Sleep and Associated Events v2.3 (April 2016) with a hypopnea requiring 4% desaturations.  The channels recorded and monitored were frontal, central and occipital EEG, electrooculogram (EOG), submentalis EMG (chin), nasal and oral airflow, thoracic and abdominal wall motion, anterior tibialis EMG, snore microphone, electrocardiogram, and pulse oximetry.  MEDICATIONS Medications self-administered by patient taken the night of the study : Eliquis, ATORVASTATIN, Osybutymin, preservision  SLEEP ARCHITECTURE The study was initiated at 11:10:45 PM and ended at 5:28:38 AM.  Sleep onset time was 23.8 minutes and the sleep efficiency was 76.3%. The total sleep time was 288.5 minutes.  Stage REM latency was 179.5 minutes.  The patient spent 1.6% of the night in stage N1 sleep, 86.8% in stage N2 sleep, 0.0% in stage N3 and 11.6% in REM.  Alpha intrusion was absent.  Supine sleep was 14.04%.  RESPIRATORY PARAMETERS The overall apnea/hypopnea index (AHI) was 20.2 per hour. There were 34 total apneas, including 32 obstructive, 0 central and 2 mixed apneas. There were 63 hypopneas and 24 RERAs.  The AHI during Stage REM sleep was 32.2 per hour.  AHI while supine was 47.4 per hour.  The mean oxygen saturation was 94.5%. The minimum SpO2 during sleep was 78.0%.  moderate snoring was noted during this study.  CARDIAC DATA The 2 lead EKG demonstrated atrial fibrillation. The mean heart rate was 81.6 beats per minute.  Other EKG findings include: PVCs.  LEG MOVEMENT DATA The total PLMS were 0 with a resulting PLMS index of 0.0. Associated arousal with leg movement index was 1.2 .  IMPRESSIONS - Moderate obstructive sleep apnea occurred during this study (AHI = 20.2/h). - Moderate oxygen desaturation was noted during this study (Min O2 = 78.0%). - The patient snored with moderate snoring volume. - Atrial fibrillation and PVCs were noted during this study. - Clinically significant periodic limb movements did not occur during sleep. No significant associated arousals.  DIAGNOSIS - Obstructive Sleep Apnea (G47.33) - Nocturnal Hypoxemia (G47.36) - Atrial Fibrillation  RECOMMENDATIONS - Therapeutic CPAP titration to determine optimal pressure required to alleviate sleep disordered breathing. - Positional therapy avoiding supine position during sleep. - Avoid alcohol, sedatives and other CNS depressants that may worsen sleep apnea and disrupt normal sleep architecture. - Sleep hygiene should be reviewed to assess factors that may improve sleep quality. - Weight management and regular exercise should be initiated or continued if appropriate.  [Electronically signed] 03/21/2021 07:24 AM  Fransico Him MD, ABSM Diplomate, American Board of Sleep Medicine

## 2021-03-22 ENCOUNTER — Telehealth: Payer: Self-pay | Admitting: *Deleted

## 2021-03-22 DIAGNOSIS — G4733 Obstructive sleep apnea (adult) (pediatric): Secondary | ICD-10-CM

## 2021-03-22 NOTE — Addendum Note (Signed)
Addended by: Freada Bergeron on: 03/22/2021 12:16 PM   Modules accepted: Orders

## 2021-03-22 NOTE — Telephone Encounter (Signed)
The patient has been notified of the result and verbalized understanding.  All questions (if any) were answered. Marolyn Hammock, [place] 03/22/2021 12:14 PM    Titration to schedule

## 2021-03-22 NOTE — Telephone Encounter (Signed)
-----   Message from Sueanne Margarita, MD sent at 03/21/2021  7:26 AM EST ----- Please let patient know that they have sleep apnea.  Recommend therapeutic CPAP titration for treatment of patient's sleep disordered breathing.  If unable to perform an in lab titration then initiate ResMed auto CPAP from 4 to 15cm H2O with heated humidity and mask of choice and overnight pulse ox on CPAP.

## 2021-03-23 DIAGNOSIS — M9903 Segmental and somatic dysfunction of lumbar region: Secondary | ICD-10-CM | POA: Diagnosis not present

## 2021-03-23 DIAGNOSIS — M5136 Other intervertebral disc degeneration, lumbar region: Secondary | ICD-10-CM | POA: Diagnosis not present

## 2021-03-23 DIAGNOSIS — M9902 Segmental and somatic dysfunction of thoracic region: Secondary | ICD-10-CM | POA: Diagnosis not present

## 2021-03-23 DIAGNOSIS — S29012A Strain of muscle and tendon of back wall of thorax, initial encounter: Secondary | ICD-10-CM | POA: Diagnosis not present

## 2021-03-23 DIAGNOSIS — M5032 Other cervical disc degeneration, mid-cervical region, unspecified level: Secondary | ICD-10-CM | POA: Diagnosis not present

## 2021-03-23 DIAGNOSIS — M9901 Segmental and somatic dysfunction of cervical region: Secondary | ICD-10-CM | POA: Diagnosis not present

## 2021-03-28 ENCOUNTER — Encounter: Payer: Self-pay | Admitting: Family Medicine

## 2021-03-28 DIAGNOSIS — G4733 Obstructive sleep apnea (adult) (pediatric): Secondary | ICD-10-CM | POA: Insufficient documentation

## 2021-04-01 DIAGNOSIS — Z6833 Body mass index (BMI) 33.0-33.9, adult: Secondary | ICD-10-CM | POA: Diagnosis not present

## 2021-04-01 DIAGNOSIS — M542 Cervicalgia: Secondary | ICD-10-CM | POA: Diagnosis not present

## 2021-04-01 DIAGNOSIS — M47812 Spondylosis without myelopathy or radiculopathy, cervical region: Secondary | ICD-10-CM | POA: Diagnosis not present

## 2021-04-01 DIAGNOSIS — R519 Headache, unspecified: Secondary | ICD-10-CM | POA: Diagnosis not present

## 2021-04-04 ENCOUNTER — Encounter: Payer: Self-pay | Admitting: Family Medicine

## 2021-04-04 ENCOUNTER — Ambulatory Visit (INDEPENDENT_AMBULATORY_CARE_PROVIDER_SITE_OTHER): Payer: Medicare Other | Admitting: Family Medicine

## 2021-04-04 ENCOUNTER — Ambulatory Visit (INDEPENDENT_AMBULATORY_CARE_PROVIDER_SITE_OTHER): Payer: Medicare Other

## 2021-04-04 ENCOUNTER — Other Ambulatory Visit: Payer: Self-pay | Admitting: Family Medicine

## 2021-04-04 ENCOUNTER — Other Ambulatory Visit: Payer: Self-pay

## 2021-04-04 ENCOUNTER — Encounter: Payer: Self-pay | Admitting: Physician Assistant

## 2021-04-04 ENCOUNTER — Encounter: Payer: Self-pay | Admitting: Cardiology

## 2021-04-04 ENCOUNTER — Ambulatory Visit (INDEPENDENT_AMBULATORY_CARE_PROVIDER_SITE_OTHER): Payer: Medicare Other | Admitting: Cardiology

## 2021-04-04 ENCOUNTER — Ambulatory Visit (INDEPENDENT_AMBULATORY_CARE_PROVIDER_SITE_OTHER): Payer: Medicare Other | Admitting: Physician Assistant

## 2021-04-04 VITALS — BP 130/80 | HR 114 | Temp 98.0°F | Ht 60.0 in | Wt 174.0 lb

## 2021-04-04 VITALS — BP 122/70 | HR 123 | Ht 60.0 in | Wt 175.4 lb

## 2021-04-04 VITALS — BP 130/80 | HR 114 | Ht 60.0 in | Wt 174.0 lb

## 2021-04-04 DIAGNOSIS — I4892 Unspecified atrial flutter: Secondary | ICD-10-CM | POA: Diagnosis not present

## 2021-04-04 DIAGNOSIS — M25551 Pain in right hip: Secondary | ICD-10-CM

## 2021-04-04 DIAGNOSIS — Z79899 Other long term (current) drug therapy: Secondary | ICD-10-CM | POA: Diagnosis not present

## 2021-04-04 DIAGNOSIS — E782 Mixed hyperlipidemia: Secondary | ICD-10-CM | POA: Diagnosis not present

## 2021-04-04 DIAGNOSIS — I351 Nonrheumatic aortic (valve) insufficiency: Secondary | ICD-10-CM | POA: Diagnosis not present

## 2021-04-04 IMAGING — DX DG HIP (WITH OR WITHOUT PELVIS) 2-3V*R*
3 series · 3 of 3 positions shown · non-contrast
Comparison: None.

CLINICAL DATA: Right hip pain with limited range of motion. Pain
for months, worse after fall 1 month ago.

EXAM:
DG HIP (WITH OR WITHOUT PELVIS) 2-3V RIGHT

[pelvis ap]
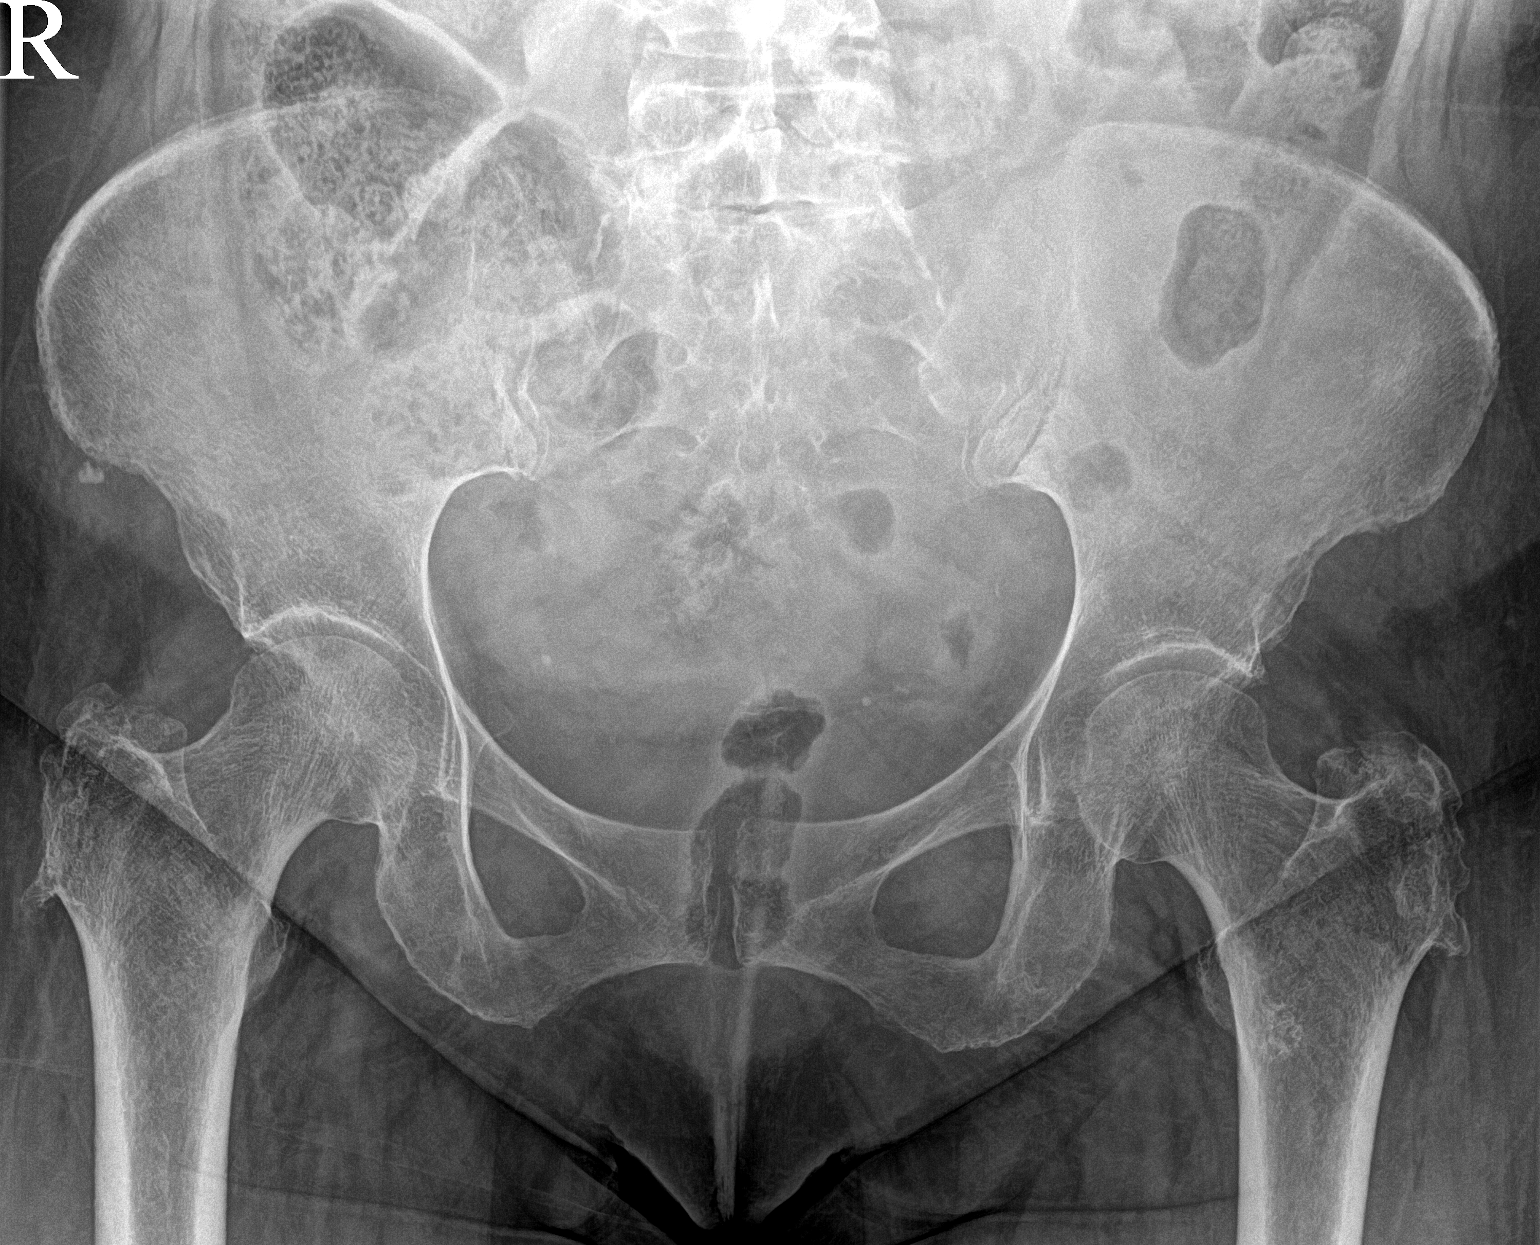

[hip ap]
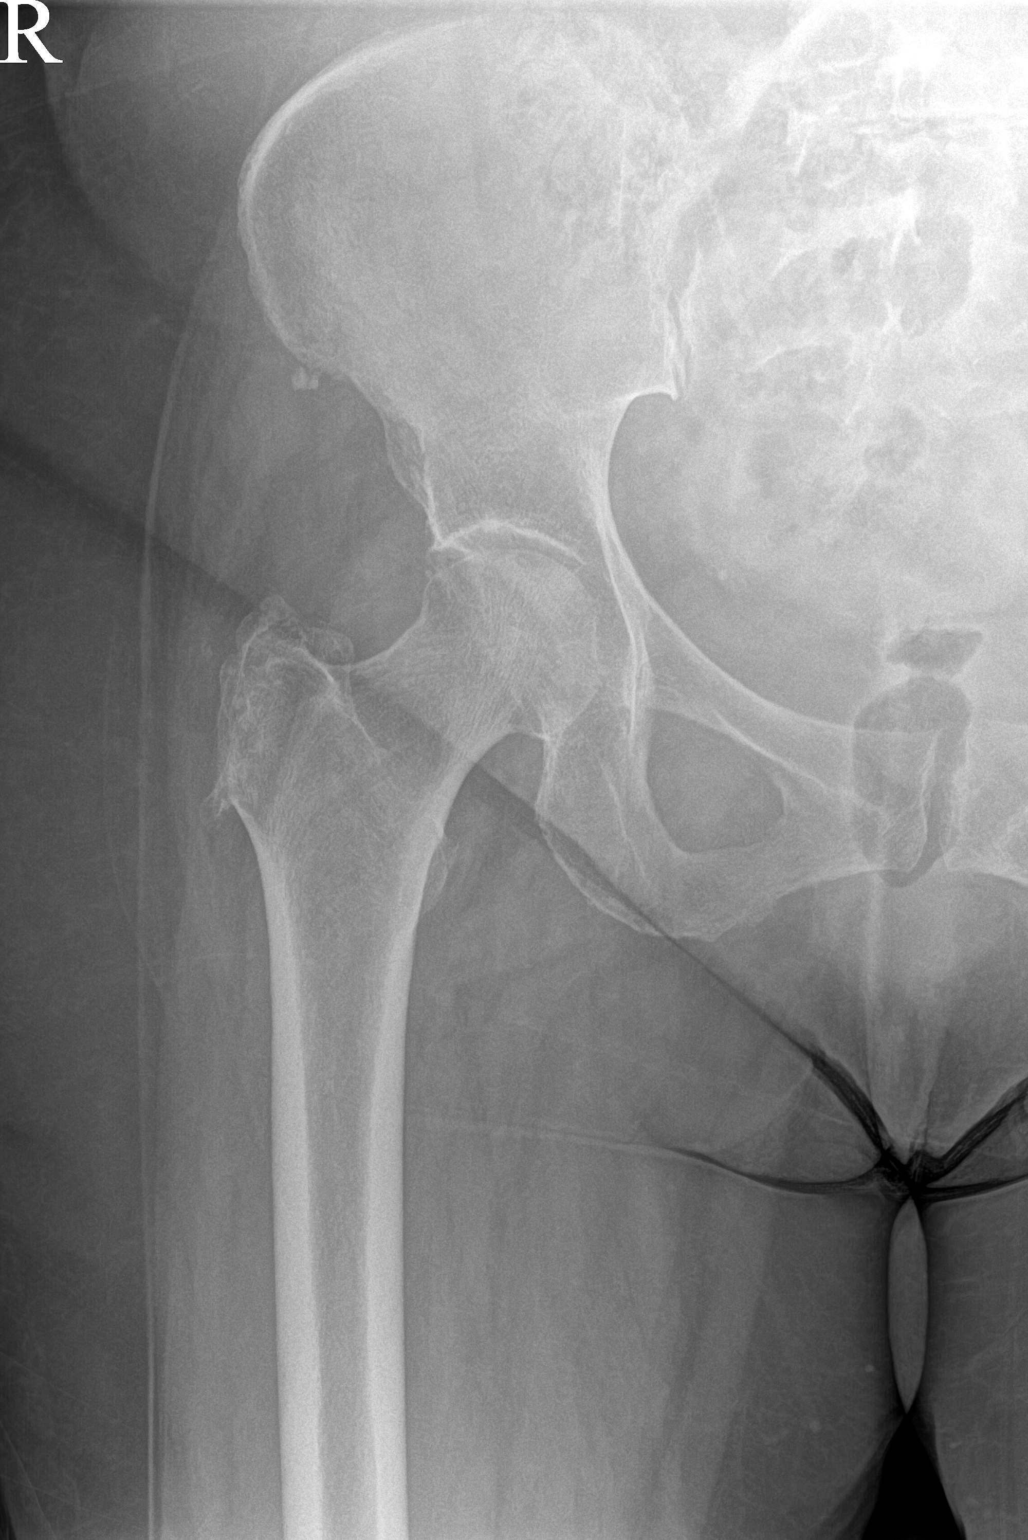

[hip frog leg]
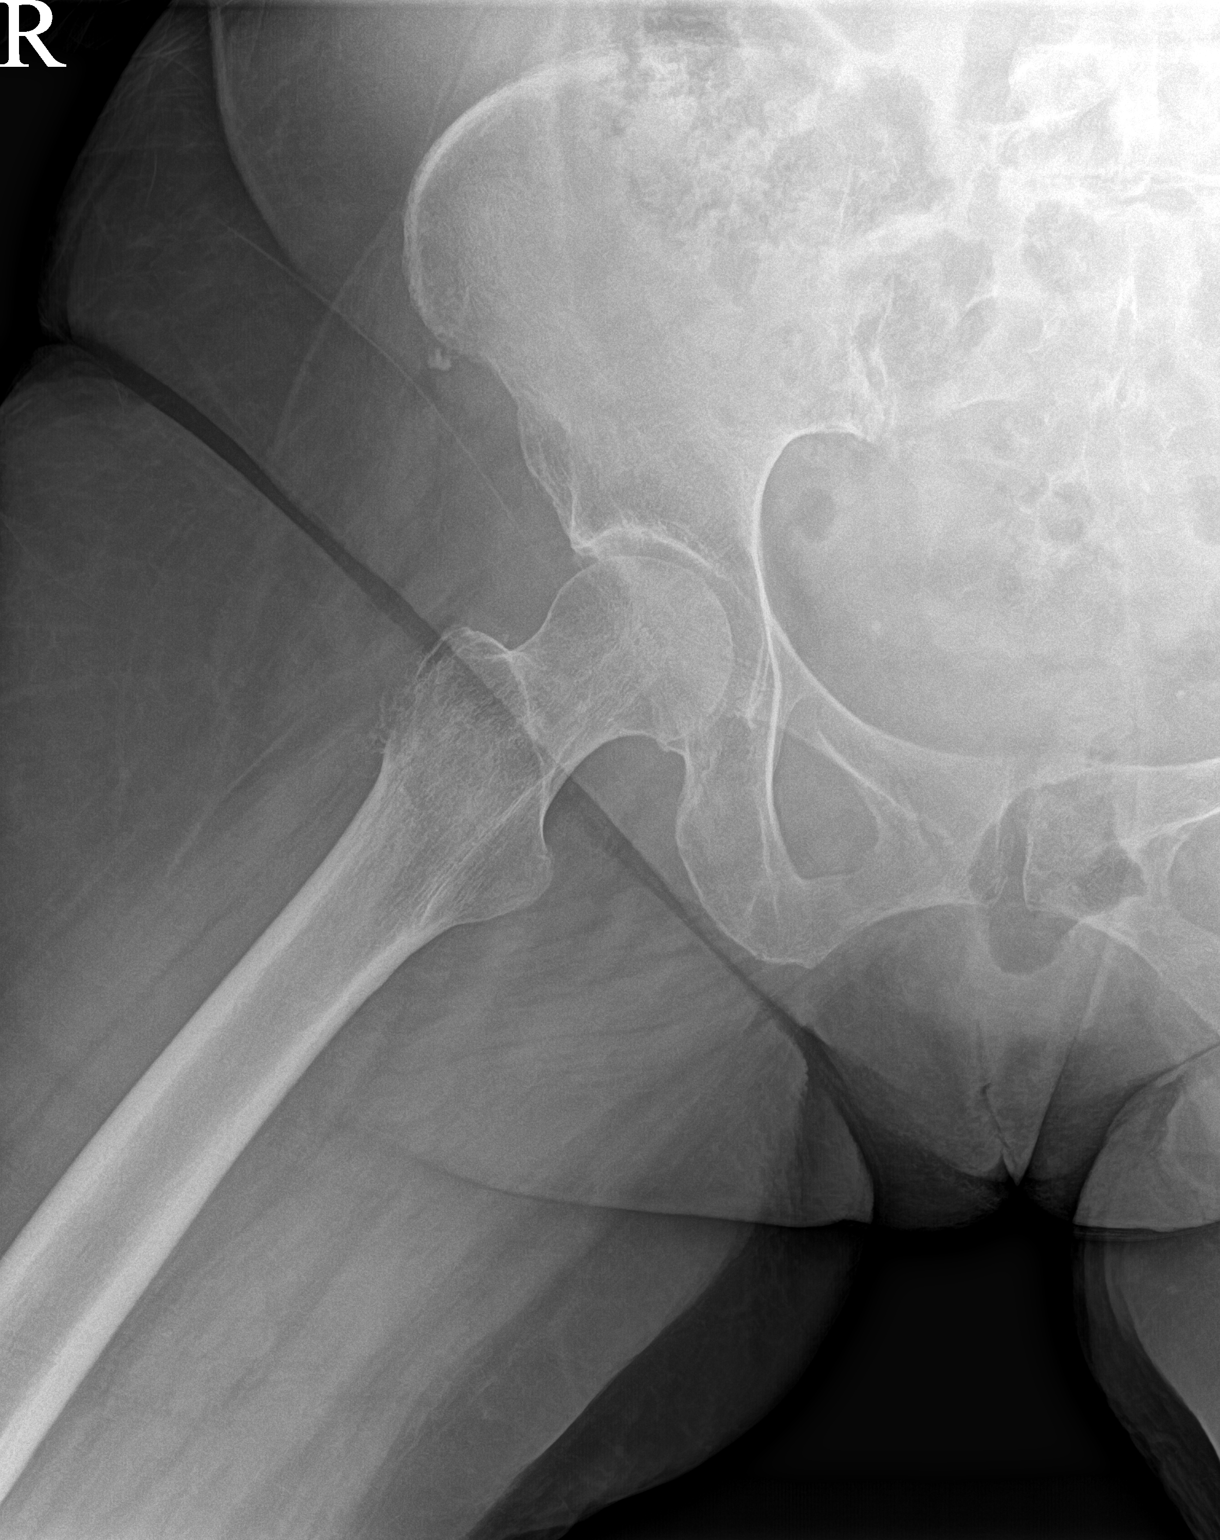

[3 of 3 positions shown; findings below may reference images not displayed]

FINDINGS: There right hip joint space is preserved. There is mild lateral
acetabular spurring. No acute or healing fracture. Femoral head is
well seated. No evidence of avascular necrosis or erosive change.
Pubic rami and remainder of the bony pelvis are intact. No
visualized focal bone lesion or bone destruction. There is mild
enthesopathic change about both greater trochanters. Pubic symphysis
and sacroiliac joints are congruent.
IMPRESSION: Mild osteoarthritis of the right hip.

## 2021-04-04 MED ORDER — DILTIAZEM HCL ER COATED BEADS 180 MG PO CP24: 180.0000 mg | ORAL_CAPSULE | Freq: Every day | ORAL | 3 refills | Status: DC

## 2021-04-04 NOTE — Progress Notes (Signed)
Martha Gilmore is a 80 y.o. female here for leg pain.    History of Present Illness:   Chief Complaint  Patient presents with   Leg pain    Pt c/o right leg and  groin area pain since November 11, felt like she pulled a muscle had been doing ice and heat and then fell Nov 28th on the floor on right hip since then pain is worse.   Pat presented to today's visit with her daughter, Martha Gilmore.  HPI  Right Leg Pain  Martha Gilmore presents with c/o right leg and groin area pain that has been onset for a month. States the pain initially started on November 11th following her trying to scoot over in her bed which led to pt believing she pulled a muscle. Due to this, pt was applying heat and ice to her leg which provided minor relief.   About two weeks later after her initial pain, Martha Gilmore states she had fallen onto her right hip after trying to sit down but missing the chair. Since then, she has had trouble walking and has been using a cane that belonged to her late husband. She does admit to feeling some numbness in her leg at times, mainly at night. Pt has been taking tramadol 50 mg daily and gabapentin 300 mg daily, due to her cervical spondylosis, but states it has not touched the pain in her hip/groin area. Denies tingling or bruising.   Past Medical History:  Diagnosis Date   Aortic insufficiency    mild to moderate by echo 12/2020   Atrial flutter ([place]) 12/30/2020   Cataracts, bilateral 09/23/2015   Clostridium difficile infection 04/16/2017   H/o C.diff due to clindamicyin 09/2016; two rounds of treatment for cure. Dr. Benson [place]   Depression    Hyperlipidemia    Hypertension    OAB (overactive bladder) 12/16/2013   Osteopenia 08/05/2012   Dexa scan 07/2012 mild osteopenia, T = -1.1   Seasonal allergies    Urge and stress incontinence      Social History   Tobacco Use   Smoking status: Never   Smokeless tobacco: Never  Vaping Use   Vaping Use: Never used  Substance Use Topics   Alcohol  use: No   Drug use: No    Past Surgical History:  Procedure Laterality Date   APPENDECTOMY  1971   CESAREAN SECTION     CHOLECYSTECTOMY     Teeth Implants     TRIGGER FINGER RELEASE     VAGINAL HYSTERECTOMY      Family History  Problem Relation Age of Onset   Heart disease Mother    Hypertension Mother    Cancer Father    Heart disease Father    Lung cancer Sister    Heart disease Sister    Diabetes Sister    Kidney cancer Sister    Brain cancer Brother     Allergies  Allergen Reactions   Aspirin Itching   Meperidine Other (See Comments)    hallucinations Other Reaction: HALLUCINATIONS hallucinations    Penicillins Other (See Comments) and Rash    Other Reaction: RASH & HIVES    Statins Itching   Cephalexin Other (See Comments) and Rash    Other Reaction: RASH & ITCHING    Clindamycin/Lincomycin Other (See Comments) and Rash    Other Reaction: RASH & ITCHING    Demerol  [Meperidine Hcl] Other (See Comments)    hallucinations   Tape Rash    Current Medications:  Current Outpatient Medications:    apixaban (ELIQUIS) 5 MG TABS tablet, Take 1 tablet (5 mg total) by mouth 2 (two) times daily., Disp: 180 tablet, Rfl: 3   atorvastatin (LIPITOR) 10 MG tablet, TAKE 1 TABLET BY MOUTH EVERYDAY AT BEDTIME, Disp: 90 tablet, Rfl: 3   citalopram (CELEXA) 20 MG tablet, TAKE 1 TABLET BY MOUTH EVERY DAY, Disp: 90 tablet, Rfl: 3   diltiazem (CARDIZEM CD) 120 MG 24 hr capsule, Take 1 capsule (120 mg total) by mouth daily., Disp: 90 capsule, Rfl: 3   gabapentin (NEURONTIN) 600 MG tablet, Take 0.5 tablets (300 mg total) by mouth at bedtime., Disp: 45 tablet, Rfl: 3   levothyroxine (SYNTHROID) 100 MCG tablet, Take 1 tablet (100 mcg total) by mouth daily., Disp: 90 tablet, Rfl: 3   Multiple Vitamins-Minerals (PRESERVISION AREDS PO), Take by mouth., Disp: , Rfl:    omeprazole (PRILOSEC) 20 MG capsule, TAKE 1 CAPSULE BY MOUTH EVERY DAY, Disp: 90 capsule, Rfl: 3   oxybutynin  (DITROPAN-XL) 10 MG 24 hr tablet, TAKE ONE TABLET BY MOUTH DAILY AT BEDTIME, Disp: 90 tablet, Rfl: 0   Polyethyl Glycol-Propyl Glycol (SYSTANE OP), Apply 1 drop to eye as needed (DRY EYES)., Disp: , Rfl:    traMADol (ULTRAM) 50 MG tablet, Take 1 tablet (50 mg total) by mouth 3 (three) times daily as needed., Disp: 60 tablet, Rfl: 2   Review of Systems:   ROS Negative unless otherwise specified per HPI.  Vitals:   Vitals:   04/04/21 0937  BP: 130/80  Pulse: (!) 114  Temp: 98 F (36.7 C)  TempSrc: Temporal  SpO2: 96%  Weight: 174 lb (78.9 kg)  Height: 5' (1.524 m)     Body mass index is 33.98 kg/m.  Physical Exam:   Physical Exam Vitals and nursing note reviewed.  Constitutional:      General: She is not in acute distress.    Appearance: She is well-developed. She is not ill-appearing or toxic-appearing.  Cardiovascular:     Rate and Rhythm: Normal rate and regular rhythm.     Pulses: Normal pulses.     Heart sounds: Normal heart sounds, S1 normal and S2 normal.  Pulmonary:     Effort: Pulmonary effort is normal.     Breath sounds: Normal breath sounds.  Musculoskeletal:     Right hip: Tenderness present. Decreased strength.     Comments: Antalgic gait present TTP to lateral aspect of R hip  Skin:    General: Skin is warm and dry.  Neurological:     Mental Status: She is alert.     GCS: GCS eye subscore is 4. GCS verbal subscore is 5. GCS motor subscore is 6.  Psychiatric:        Speech: Speech normal.        Behavior: Behavior normal. Behavior is cooperative.    Assessment and Plan:   Right Leg Pain Recommend imaging, however, due to the worsening nature of symptoms, will send urgently to Sports Medicine for further evaluation, defer imaging to them We were able to secure appointment today, appreciate coordination of care  I,Havlyn C Ratchford,acting as a scribe for Inda Coke, PA.,have documented all relevant documentation on the behalf of Inda Coke, PA,as directed by  Inda Coke, PA while in the presence of Inda Coke, [place].  I, Inda Coke, [place], have reviewed all documentation for this visit. The documentation on 04/04/21 for the exam, diagnosis, procedures, and orders are all accurate and complete.  Inda Coke, PA-C

## 2021-04-04 NOTE — Patient Instructions (Addendum)
Good to see you today.  I've referred you to Physical Therapy.  Let us know if you have not heard from them regarding scheduling in the next week.  I've referred you for a hip MRI.  Please let us know if you don't hear from them by the end of the week regarding scheduling.  Use a walker.  Follow-up: after MRI

## 2021-04-04 NOTE — Patient Instructions (Signed)
Medication Instructions:  Your physician has recommended you make the following change in your medication:  INCREASE: Cardizem 180 mg once daily *If you need a refill on your cardiac medications before your next appointment, please call your pharmacy*   Lab Work: Your physician recommends that you return for lab work in:  TODAY: BMET, Mag, CBC If you have labs (blood work) drawn today and your tests are completely normal, you will receive your results only by: MyChart Message (if you have MyChart) OR A paper copy in the mail If you have any lab test that is abnormal or we need to change your treatment, we will call you to review the results.   Testing/Procedures: Your physician has recommended that you have a Cardioversion (DCCV). Electrical Cardioversion uses a jolt of electricity to your heart either through paddles or wired patches attached to your chest. This is a controlled, usually prescheduled, procedure. Defibrillation is done under light anesthesia in the hospital, and you usually go home the day of the procedure. This is done to get your heart back into a normal rhythm. You are not awake for the procedure. Please see the instruction sheet given to you today.  You are scheduled for a Cardioversion on December 21st with Dr. Gasper Sells.  Please arrive at the [facility] (Main Entrance A) at [facility]: [address] [place], [place] [address] at 9:30 am. (1 hour prior to procedure)  DIET: Nothing to eat or drink after midnight except a sip of water with medications (see medication instructions below)  FYI: For your safety, and to allow [place] to monitor your vital signs accurately during the surgery/procedure we request that   if you have artificial nails, gel coating, SNS etc. Please have those removed prior to your surgery/procedure. Not having the nail coverings /polish removed may result in cancellation or delay of your surgery/procedure.   Medication  Instructions:  Continue your anticoagulant: Eliquis You will need to continue your anticoagulant after your procedure until you  are told by your provider that it is safe to stop   You must have a responsible person to drive you home and stay in the waiting area during your procedure. Failure to do so could result in cancellation.  Bring your insurance cards.  *Special Note: Every effort is made to have your procedure done on time. Occasionally there are emergencies that occur at the hospital that may cause delays. Please be patient if a delay does occur.      Follow-Up: At [facility], you and your health needs are our priority.  As part of our continuing mission to provide you with exceptional heart care, we have created designated Provider Care Teams.  These Care Teams include your primary Cardiologist (physician) and Advanced Practice Providers (APPs -  Physician Assistants and Nurse Practitioners) who all work together to provide you with the care you need, when you need it.  We recommend signing up for the patient portal called "MyChart".  Sign up information is provided on this After Visit Summary.  MyChart is used to connect with patients for Virtual Visits (Telemedicine).  Patients are able to view lab/test results, encounter notes, upcoming appointments, etc.  Non-urgent messages can be sent to your provider as well.   To learn more about what you can do with MyChart, go to [email or web address].    Your next appointment:   6 month(s)  The format for your next appointment:   In Person  Provider:   Berniece Salines, DO  Other Instructions

## 2021-04-04 NOTE — Patient Instructions (Signed)
It was great to see you!  Please go to your scheduled appointment with Dr. [name] at 2:45pm.  Have a great holiday!  Take care,  Inda Coke PA-C

## 2021-04-04 NOTE — Progress Notes (Signed)
I, Wendy Poet, LAT, ATC, am serving as scribe for Dr. [name].  Martha Gilmore is a 80 y.o. female who presents to [place] at [facility] today for R hip pain. Pt previously seen by Dr. [name] on 09/27/20 for neck pain. Today, pt c/o R hip pain since 03/04/21 and then worsened after a fall on 11/28, missed a chair when trying to sit down and landed on her R hip. Initially, pt felt like she "pulled" a muscle when trying to scoot over in her bed on 11/11. Pt locates pain to her R groin and R lateral hip. She rates her pain as severe up to 9 or 10 out of 10 at times.  Tramadol is not sufficient to control her pain.  Radiating pain: yes into her R thigh and calf Low back pain: yes but chronic Aggravates: climbing stairs; getting in/out of car; bed mobility; walking; transitioning from sit-to-stand and vice versa Treatments tried: ice, heat, tramadol, gabapentin  Dx imaging: 10/17/19 L-spine MRI 09/24/19 L-spine XR  Pertinent review of systems: No fevers or chills  Relevant historical information: Osteopenia   Exam:  BP 130/80 (BP Location: Left Arm, Patient Position: Sitting, Cuff Size: Normal)   Pulse (!) 114   Ht 5' (1.524 m)   Wt 174 lb (78.9 kg)   SpO2 96%   BMI 33.98 kg/m  General: Well Developed, well nourished, and in no acute distress.   MSK: Right hip normal-appearing Nontender overlying ASIS.  Mildly tender palpation lateral hip greater trochanter. Hip is painful to range of motion including rotation and flexion although less painful to passive range of motion. Painful to active range of motion including active flexion and external rotation internal rotation and abduction. Strength is intact but diminished to flexion and abduction and external rotation. Antalgic gait.  Using a cane to ambulate.    Lab and Radiology Results  X-ray images right hip obtained today personally and independently interpreted No acute fractures available. Mild DJD  right hip. Enthesopathic changes at greater trochanter.     Assessment and Plan: 80 y.o. female with significant right hip pain after a fall occurring about 2 weeks ago.  X-ray per my read does not show an acute fracture however radiology overread is still pending.  I am worried about an occult injury not visible on x-ray.  Plan for MRI of the hip given the severity of her pain and dysfunction.  One of the likely treatment outcomes of the MRI is physical therapy so I have ordered physical therapy and asked her to schedule to start a few days after the MRI is going to be done so that it is ready to go.  However certainly a potential outcome after MRI is surgery or injection.  For pain control for now recommend max dose Tylenol with the tramadol.  If this is not sufficient she will let me know and I will prescribe hydrocodone.  Symptoms are severe  PDMP not reviewed this encounter. Orders Placed This Encounter  Procedures   DG HIP UNILAT W OR W/O PELVIS 2-3 VIEWS RIGHT    Standing Status:   Future    Number of Occurrences:   1    Standing Expiration Date:   05/05/2021    Order Specific Question:   Reason for Exam (SYMPTOM  OR DIAGNOSIS REQUIRED)    Answer:   R hip pain    Order Specific Question:   Preferred imaging location?    Answer:   [facility]   MR HIP RIGHT WO CONTRAST    Standing Status:   Future    Standing Expiration Date:   04/04/2022    Order Specific Question:   What is the patient's sedation requirement?    Answer:   No Sedation    Order Specific Question:   Does the patient have a pacemaker or implanted devices?    Answer:   No    Order Specific Question:   Preferred imaging location?    Answer:   [facility] (table limit-350lbs)   Ambulatory referral to Physical Therapy    Referral Priority:   Routine    Referral Type:   Physical Medicine    Referral Reason:   Specialty Services Required    Requested Specialty:   Physical Therapy    Number of  Visits Requested:   1   No orders of the defined types were placed in this encounter.    Discussed warning signs or symptoms. Please see discharge instructions. Patient expresses understanding.   The above documentation has been reviewed and is accurate and complete [name], M.D.

## 2021-04-05 ENCOUNTER — Encounter (HOSPITAL_COMMUNITY): Payer: Self-pay | Admitting: Internal Medicine

## 2021-04-05 LAB — CBC WITH DIFFERENTIAL/PLATELET
Basophils Absolute: 0.1 10*3/uL (ref 0.0–0.2)
Basos: 1 %
EOS (ABSOLUTE): 0.1 10*3/uL (ref 0.0–0.4)
Eos: 1 %
Hematocrit: 40 % (ref 34.0–46.6)
Hemoglobin: 13 g/dL (ref 11.1–15.9)
Immature Grans (Abs): 0 10*3/uL (ref 0.0–0.1)
Immature Granulocytes: 0 %
Lymphocytes Absolute: 2.3 10*3/uL (ref 0.7–3.1)
Lymphs: 21 %
MCH: 29.2 pg (ref 26.6–33.0)
MCHC: 32.5 g/dL (ref 31.5–35.7)
MCV: 90 fL (ref 79–97)
Monocytes Absolute: 0.9 10*3/uL (ref 0.1–0.9)
Monocytes: 8 %
Neutrophils Absolute: 7.9 10*3/uL — ABNORMAL HIGH (ref 1.4–7.0)
Neutrophils: 69 %
Platelets: 365 10*3/uL (ref 150–450)
RBC: 4.45 x10E6/uL (ref 3.77–5.28)
RDW: 12.4 % (ref 11.7–15.4)
WBC: 11.3 10*3/uL — ABNORMAL HIGH (ref 3.4–10.8)

## 2021-04-05 LAB — BASIC METABOLIC PANEL
BUN/Creatinine Ratio: 18 (ref 12–28)
BUN: 16 mg/dL (ref 8–27)
CO2: 25 mmol/L (ref 20–29)
Calcium: 9 mg/dL (ref 8.7–10.3)
Chloride: 98 mmol/L (ref 96–106)
Creatinine, Ser: 0.9 mg/dL (ref 0.57–1.00)
Glucose: 92 mg/dL (ref 70–99)
Potassium: 4.5 mmol/L (ref 3.5–5.2)
Sodium: 138 mmol/L (ref 134–144)
eGFR: 65 mL/min/{1.73_m2} (ref >59)

## 2021-04-05 LAB — MAGNESIUM: Magnesium: 2.1 mg/dL (ref 1.6–2.3)

## 2021-04-06 NOTE — Progress Notes (Signed)
Right hip x-ray shows some mild arthritis.

## 2021-04-07 DIAGNOSIS — I351 Nonrheumatic aortic (valve) insufficiency: Secondary | ICD-10-CM | POA: Insufficient documentation

## 2021-04-07 DIAGNOSIS — Z79899 Other long term (current) drug therapy: Secondary | ICD-10-CM | POA: Insufficient documentation

## 2021-04-07 NOTE — H&P (View-Only) (Signed)
Cardiology Office Note:    Date:  04/07/2021   ID:  Martha Gilmore, DOB Jun 13, 1940, MRN [id number]  PCP:  Leamon Arnt, MD  Cardiologist:  Berniece Salines, DO  Electrophysiologist:  None   Referring MD: Leamon Arnt, MD   " I am    History of Present Illness:    Martha Gilmore is a 80 y.o. female with a hx of   Past Medical History:  Diagnosis Date   Aortic insufficiency    mild to moderate by echo 12/2020   Atrial flutter ([place]) 12/30/2020   Cataracts, bilateral 09/23/2015   Clostridium difficile infection 04/16/2017   H/o C.diff due to clindamicyin 09/2016; two rounds of treatment for cure. Dr. Benson Norway   Depression    Hyperlipidemia    Hypertension    OAB (overactive bladder) 12/16/2013   Osteopenia 08/05/2012   Dexa scan 07/2012 mild osteopenia, T = -1.1   Seasonal allergies    Urge and stress incontinence     Past Surgical History:  Procedure Laterality Date   APPENDECTOMY  1971   CESAREAN SECTION     CHOLECYSTECTOMY     Teeth Implants     TRIGGER FINGER RELEASE     VAGINAL HYSTERECTOMY      Current Medications: Current Meds  Medication Sig   apixaban (ELIQUIS) 5 MG TABS tablet Take 1 tablet (5 mg total) by mouth 2 (two) times daily.   atorvastatin (LIPITOR) 10 MG tablet TAKE 1 TABLET BY MOUTH EVERYDAY AT BEDTIME   citalopram (CELEXA) 20 MG tablet TAKE 1 TABLET BY MOUTH EVERY DAY   diltiazem (CARDIZEM CD) 180 MG 24 hr capsule Take 1 capsule (180 mg total) by mouth daily.   gabapentin (NEURONTIN) 600 MG tablet Take 0.5 tablets (300 mg total) by mouth at bedtime.   levothyroxine (SYNTHROID) 100 MCG tablet Take 1 tablet (100 mcg total) by mouth daily.   Multiple Vitamins-Minerals (PRESERVISION AREDS PO) Take 1 capsule by mouth in the morning and at bedtime.   omeprazole (PRILOSEC) 20 MG capsule TAKE 1 CAPSULE BY MOUTH EVERY DAY   oxybutynin (DITROPAN-XL) 10 MG 24 hr tablet TAKE ONE TABLET BY MOUTH DAILY AT BEDTIME   Polyethyl Glycol-Propyl Glycol (SYSTANE OP)  Place 1 drop into both eyes daily as needed (DRY EYES).   traMADol (ULTRAM) 50 MG tablet Take 1 tablet (50 mg total) by mouth 3 (three) times daily as needed.   [DISCONTINUED] diltiazem (CARDIZEM CD) 120 MG 24 hr capsule Take 1 capsule (120 mg total) by mouth daily.     Allergies:   Aspirin, Meperidine, Penicillins, Statins, Cephalexin, Clindamycin/lincomycin, Demerol  [meperidine hcl], and Tape   Social History   Socioeconomic History   Marital status: Widowed    Spouse name: Not on file   Number of children: Not on file   Years of education: Not on file   Highest education level: Not on file  Occupational History   Not on file  Tobacco Use   Smoking status: Never   Smokeless tobacco: Never  Vaping Use   Vaping Use: Never used  Substance and Sexual Activity   Alcohol use: No   Drug use: No   Sexual activity: Never    Partners: Male  Other Topics Concern   Not on file  Social History Narrative   Not on file   Social Determinants of Health   Financial Resource Strain: Low Risk    Difficulty of Paying Living Expenses: Not hard at all  Food Insecurity: No Food  Insecurity   Worried About Charity fundraiser in the Last Year: Never true   [place] in the Last Year: Never true  Transportation Needs: No Transportation Needs   Lack of Transportation (Medical): No   Lack of Transportation (Non-Medical): No  Physical Activity: Inactive   Days of Exercise per Week: 0 days   Minutes of Exercise per Session: 0 min  Stress: No Stress Concern Present   Feeling of Stress : Not at all  Social Connections: Moderately Integrated   Frequency of Communication with Friends and Family: More than three times a week   Frequency of Social Gatherings with Friends and Family: Once a week   Attends Religious Services: More than 4 times per year   Active Member of [employer] or Organizations: Yes   Attends Archivist Meetings: 1 to 4 times per year   Marital Status: Widowed      Family History: The patient's family history includes Brain cancer in her brother; Cancer in her father; Diabetes in her sister; Heart disease in her father, mother, and sister; Hypertension in her mother; Kidney cancer in her sister; Lung cancer in her sister.  ROS:   Review of Systems  Constitution: Negative for decreased appetite, fever and weight gain.  HENT: Negative for congestion, ear discharge, hoarse voice and sore throat.   Eyes: Negative for discharge, redness, vision loss in right eye and visual halos.  Cardiovascular: Negative for chest pain, dyspnea on exertion, leg swelling, orthopnea and palpitations.  Respiratory: Negative for cough, hemoptysis, shortness of breath and snoring.   Endocrine: Negative for heat intolerance and polyphagia.  Hematologic/Lymphatic: Negative for bleeding problem. Does not bruise/bleed easily.  Skin: Negative for flushing, nail changes, rash and suspicious lesions.  Musculoskeletal: Negative for arthritis, joint pain, muscle cramps, myalgias, neck pain and stiffness.  Gastrointestinal: Negative for abdominal pain, bowel incontinence, diarrhea and excessive appetite.  Genitourinary: Negative for decreased libido, genital sores and incomplete emptying.  Neurological: Negative for brief paralysis, focal weakness, headaches and loss of balance.  Psychiatric/Behavioral: Negative for altered mental status, depression and suicidal ideas.  Allergic/Immunologic: Negative for HIV exposure and persistent infections.    EKGs/Labs/Other Studies Reviewed:    The following studies were reviewed today:   EKG:  The ekg ordered today demonstrates atrial flutter today  Zio monitor  Predominant rhythm was atrial flutter with average heart rate 92bpm and ranged from 48 to 172bpm. No sinus rhythm was present. Occasional PVCs were present.  TTE 01/18/2021 IMPRESSIONS   1. The aortic valve is calcified. Aortic valve regurgitation is mild to moderate. Mild to  moderate aortic valve sclerosis/calcification is present, without any evidence of aortic stenosis and stroke volume index  of 38.   2. Left ventricular ejection fraction, by estimation, is 60 to 65%. The  left ventricle has normal function. The left ventricle has no regional  wall motion abnormalities. Left ventricular diastolic parameters are  indeterminate due to atrial flutter.   3. Right ventricular systolic function is normal. The right ventricular  size is normal. There is normal pulmonary artery systolic pressure. The  estimated right ventricular systolic pressure is 61.6 mmHg.   4. Left atrial size was mildly dilated.   5. The mitral valve is abnormal. Mild mitral valve regurgitation. The  mean mitral valve gradient is 2.0 mmHg with average heart rate of 103 bpm.   6. The inferior vena cava is normal in size with greater than 50%  respiratory variability, suggesting right atrial  pressure of 3 mmHg.   Comparison(s): No prior Echocardiogram.   FINDINGS   Left Ventricle: Left ventricular ejection fraction, by estimation, is 60  to 65%. The left ventricle has normal function. The left ventricle has no  regional wall motion abnormalities. The left ventricular internal cavity  size was normal in size. There is   no left ventricular hypertrophy. Left ventricular diastolic parameters  are indeterminate.   Right Ventricle: The right ventricular size is normal. No increase in  right ventricular wall thickness. Right ventricular systolic function is  normal. There is normal pulmonary artery systolic pressure. The tricuspid  regurgitant velocity is 1.94 m/s, and   with an assumed right atrial pressure of 3 mmHg, the estimated right  ventricular systolic pressure is 38.7 mmHg.   Left Atrium: Left atrial size was mildly dilated.   Right Atrium: Right atrial size was normal in size.   Pericardium: There is no evidence of pericardial effusion.   Mitral Valve: The mitral valve is  abnormal. Mild mitral valve  regurgitation. The mean mitral valve gradient is 2.0 mmHg with average  heart rate of 103 bpm.   Tricuspid Valve: The tricuspid valve is grossly normal. Tricuspid valve  regurgitation is mild . No evidence of tricuspid stenosis.   Aortic Valve: The aortic valve is calcified. Aortic valve regurgitation is  mild to moderate. Mild to moderate aortic valve sclerosis/calcification is  present, without any evidence of aortic stenosis. Aortic valve mean  gradient measures 11.0 mmHg. Aortic  valve peak gradient measures 19.4 mmHg. Aortic valve area, by VTI measures  1.88 cm.   Pulmonic Valve: The pulmonic valve was grossly normal. Pulmonic valve  regurgitation is mild.   Aorta: The aortic root and ascending aorta are structurally normal, with  no evidence of dilitation.   Pulmonary Artery: The pulmonary artery is of normal size.   Venous: The inferior vena cava is normal in size with greater than 50%  respiratory variability, suggesting right atrial pressure of 3 mmHg.   IAS/Shunts: The atrial septum is grossly normal.       Recent Labs: 11/26/2020: ALT 16; TSH 1.10 04/04/2021: BUN 16; Creatinine, Ser 0.90; Hemoglobin 13.0; Magnesium 2.1; Platelets 365; Potassium 4.5; Sodium 138  Recent Lipid Panel    Component Value Date/Time   CHOL 212 (H) 11/26/2020 1019   TRIG 83.0 11/26/2020 1019   HDL 65.10 11/26/2020 1019   CHOLHDL 3 11/26/2020 1019   VLDL 16.6 11/26/2020 1019   LDLCALC 130 (H) 11/26/2020 1019    Physical Exam:    VS:  BP 122/70    Pulse (!) 123    Ht 5' (1.524 m)    Wt 175 lb 6.4 oz (79.6 kg)    SpO2 95%    BMI 34.26 kg/m     Wt Readings from Last 3 Encounters:  04/04/21 174 lb (78.9 kg)  04/04/21 175 lb 6.4 oz (79.6 kg)  04/04/21 174 lb (78.9 kg)     GEN: Well nourished, well developed in no acute distress HEENT: Normal NECK: No JVD; No carotid bruits LYMPHATICS: No lymphadenopathy CARDIAC: S1S2 noted,RRR, no murmurs, rubs,  gallops RESPIRATORY:  Clear to auscultation without rales, wheezing or rhonchi  ABDOMEN: Soft, non-tender, non-distended, +bowel sounds, no guarding. EXTREMITIES: No edema, No cyanosis, no clubbing MUSCULOSKELETAL:  No deformity  SKIN: Warm and dry NEUROLOGIC:  Alert and oriented x 3, non-focal PSYCHIATRIC:  Normal affect, good insight  ASSESSMENT:    1. Atrial flutter, unspecified type ([place])   2.  Medication management   3. Nonrheumatic aortic valve insufficiency   4. Mixed hyperlipidemia    PLAN:     She is in atrial flutter today, ZIO monitor also showed evidence of continuous atrial flutter.  We talked about rhythm control strategy today.  She is agreeable for cardioversion. Shared Decision Making/Informed Consent The risks (stroke, cardiac arrhythmias rarely resulting in the need for a temporary or permanent pacemaker, skin irritation or burns and complications associated with conscious sedation including aspiration, arrhythmia, respiratory failure and death), benefits (restoration of normal sinus rhythm) and alternatives of a direct current cardioversion were explained in detail to [name] and she agrees to proceed.   I will advised the patient that she will continue her anticoagulation without any interruption for minimum of 4 week. Hyperlipidemia - continue with current statin medication.  Her echocardiogram showed mild to moderate aortic regurgitation not sure this information with the patient.  Repeat echocardiogram in a year.  The patient is in agreement with the above plan. The patient left the office in stable condition.  The patient will follow up in   Medication Adjustments/Labs and Tests Ordered: Current medicines are reviewed at length with the patient today.  Concerns regarding medicines are outlined above.  Orders Placed This Encounter  Procedures   Basic Metabolic Panel (BMET)   Magnesium   CBC with Differential/Platelet   Ambulatory referral to Cardiac  Electrophysiology   Meds ordered this encounter  Medications   diltiazem (CARDIZEM CD) 180 MG 24 hr capsule    Sig: Take 1 capsule (180 mg total) by mouth daily.    Dispense:  90 capsule    Refill:  3    Patient Instructions  Medication Instructions:  Your physician has recommended you make the following change in your medication:  INCREASE: Cardizem 180 mg once daily *If you need a refill on your cardiac medications before your next appointment, please call your pharmacy*   Lab Work: Your physician recommends that you return for lab work in:  TODAY: BMET, Mag, CBC If you have labs (blood work) drawn today and your tests are completely normal, you will receive your results only by: MyChart Message (if you have MyChart) OR A paper copy in the mail If you have any lab test that is abnormal or we need to change your treatment, we will call you to review the results.   Testing/Procedures: Your physician has recommended that you have a Cardioversion (DCCV). Electrical Cardioversion uses a jolt of electricity to your heart either through paddles or wired patches attached to your chest. This is a controlled, usually prescheduled, procedure. Defibrillation is done under light anesthesia in the hospital, and you usually go home the day of the procedure. This is done to get your heart back into a normal rhythm. You are not awake for the procedure. Please see the instruction sheet given to you today.  You are scheduled for a Cardioversion on December 21st with Dr. Gasper Sells.  Please arrive at the [facility] (Main Entrance A) at [facility]: [address] [place], [place] [address] at 9:30 am. (1 hour prior to procedure)  DIET: Nothing to eat or drink after midnight except a sip of water with medications (see medication instructions below)  FYI: For your safety, and to allow [place] to monitor your vital signs accurately during the surgery/procedure we request that   if you have  artificial nails, gel coating, SNS etc. Please have those removed prior to your surgery/procedure. Not having the  nail coverings /polish removed may result in cancellation or delay of your surgery/procedure.   Medication Instructions:  Continue your anticoagulant: Eliquis You will need to continue your anticoagulant after your procedure until you  are told by your provider that it is safe to stop   You must have a responsible person to drive you home and stay in the waiting area during your procedure. Failure to do so could result in cancellation.  Bring your insurance cards.  *Special Note: Every effort is made to have your procedure done on time. Occasionally there are emergencies that occur at the hospital that may cause delays. Please be patient if a delay does occur.      Follow-Up: At [facility], you and your health needs are our priority.  As part of our continuing mission to provide you with exceptional heart care, we have created designated Provider Care Teams.  These Care Teams include your primary Cardiologist (physician) and Advanced Practice Providers (APPs -  Physician Assistants and Nurse Practitioners) who all work together to provide you with the care you need, when you need it.  We recommend signing up for the patient portal called "MyChart".  Sign up information is provided on this After Visit Summary.  MyChart is used to connect with patients for Virtual Visits (Telemedicine).  Patients are able to view lab/test results, encounter notes, upcoming appointments, etc.  Non-urgent messages can be sent to your provider as well.   To learn more about what you can do with MyChart, go to [email or web address].    Your next appointment:   6 month(s)  The format for your next appointment:   In Person  Provider:   Berniece Salines, DO     Other Instructions     Adopting a Healthy Lifestyle.  Know what a healthy weight is for you (roughly BMI <25) and aim to maintain  this   Aim for 7+ servings of fruits and vegetables daily   65-80+ fluid ounces of water or unsweet tea for healthy kidneys   Limit to max 1 drink of alcohol per day; avoid smoking/tobacco   Limit animal fats in diet for cholesterol and heart health - choose grass fed whenever available   Avoid highly processed foods, and foods high in saturated/trans fats   Aim for low stress - take time to unwind and care for your mental health   Aim for 150 min of moderate intensity exercise weekly for heart health, and weights twice weekly for bone health   Aim for 7-9 hours of sleep daily   When it comes to diets, agreement about the perfect plan isnt easy to find, even among the experts. Experts at the [employer] developed an idea known as the Healthy Eating Plate. Just imagine a plate divided into logical, healthy portions.   The emphasis is on diet quality:   Load up on vegetables and fruits - one-half of your plate: Aim for color and variety, and remember that potatoes dont count.   Go for whole grains - one-quarter of your plate: Whole wheat, barley, wheat berries, quinoa, oats, brown rice, and foods made with them. If you want pasta, go with whole wheat pasta.   Protein power - one-quarter of your plate: Fish, chicken, beans, and nuts are all healthy, versatile protein sources. Limit red meat.   The diet, however, does go beyond the plate, offering a few other suggestions.   Use healthy plant oils, such as olive, canola, soy, corn, sunflower  and peanut. Check the labels, and avoid partially hydrogenated oil, which have unhealthy trans fats.   If youre thirsty, drink water. Coffee and tea are good in moderation, but skip sugary drinks and limit milk and dairy products to one or two daily servings.   The type of carbohydrate in the diet is more important than the amount. Some sources of carbohydrates, such as vegetables, fruits, whole grains, and beans-are healthier  than others.   Finally, stay active  Signed, Berniece Salines, DO  04/07/2021 8:54 PM    [facility]

## 2021-04-07 NOTE — Progress Notes (Signed)
Cardiology Office Note:    Date:  04/07/2021   ID:  Martha Gilmore, DOB 26-Mar-1941, MRN [id number]  PCP:  Leamon Arnt, MD  Cardiologist:  Berniece Salines, DO  Electrophysiologist:  None   Referring MD: Leamon Arnt, MD   " I am    History of Present Illness:    Martha Gilmore is a 80 y.o. female with a hx of   Past Medical History:  Diagnosis Date   Aortic insufficiency    mild to moderate by echo 12/2020   Atrial flutter ([place]) 12/30/2020   Cataracts, bilateral 09/23/2015   Clostridium difficile infection 04/16/2017   H/o C.diff due to clindamicyin 09/2016; two rounds of treatment for cure. Dr. Benson Norway   Depression    Hyperlipidemia    Hypertension    OAB (overactive bladder) 12/16/2013   Osteopenia 08/05/2012   Dexa scan 07/2012 mild osteopenia, T = -1.1   Seasonal allergies    Urge and stress incontinence     Past Surgical History:  Procedure Laterality Date   APPENDECTOMY  1971   CESAREAN SECTION     CHOLECYSTECTOMY     Teeth Implants     TRIGGER FINGER RELEASE     VAGINAL HYSTERECTOMY      Current Medications: Current Meds  Medication Sig   apixaban (ELIQUIS) 5 MG TABS tablet Take 1 tablet (5 mg total) by mouth 2 (two) times daily.   atorvastatin (LIPITOR) 10 MG tablet TAKE 1 TABLET BY MOUTH EVERYDAY AT BEDTIME   citalopram (CELEXA) 20 MG tablet TAKE 1 TABLET BY MOUTH EVERY DAY   diltiazem (CARDIZEM CD) 180 MG 24 hr capsule Take 1 capsule (180 mg total) by mouth daily.   gabapentin (NEURONTIN) 600 MG tablet Take 0.5 tablets (300 mg total) by mouth at bedtime.   levothyroxine (SYNTHROID) 100 MCG tablet Take 1 tablet (100 mcg total) by mouth daily.   Multiple Vitamins-Minerals (PRESERVISION AREDS PO) Take 1 capsule by mouth in the morning and at bedtime.   omeprazole (PRILOSEC) 20 MG capsule TAKE 1 CAPSULE BY MOUTH EVERY DAY   oxybutynin (DITROPAN-XL) 10 MG 24 hr tablet TAKE ONE TABLET BY MOUTH DAILY AT BEDTIME   Polyethyl Glycol-Propyl Glycol (SYSTANE OP)  Place 1 drop into both eyes daily as needed (DRY EYES).   traMADol (ULTRAM) 50 MG tablet Take 1 tablet (50 mg total) by mouth 3 (three) times daily as needed.   [DISCONTINUED] diltiazem (CARDIZEM CD) 120 MG 24 hr capsule Take 1 capsule (120 mg total) by mouth daily.     Allergies:   Aspirin, Meperidine, Penicillins, Statins, Cephalexin, Clindamycin/lincomycin, Demerol  [meperidine hcl], and Tape   Social History   Socioeconomic History   Marital status: Widowed    Spouse name: Not on file   Number of children: Not on file   Years of education: Not on file   Highest education level: Not on file  Occupational History   Not on file  Tobacco Use   Smoking status: Never   Smokeless tobacco: Never  Vaping Use   Vaping Use: Never used  Substance and Sexual Activity   Alcohol use: No   Drug use: No   Sexual activity: Never    Partners: Male  Other Topics Concern   Not on file  Social History Narrative   Not on file   Social Determinants of Health   Financial Resource Strain: Low Risk    Difficulty of Paying Living Expenses: Not hard at all  Food Insecurity: No Food  Insecurity   Worried About Charity fundraiser in the Last Year: Never true   [place] in the Last Year: Never true  Transportation Needs: No Transportation Needs   Lack of Transportation (Medical): No   Lack of Transportation (Non-Medical): No  Physical Activity: Inactive   Days of Exercise per Week: 0 days   Minutes of Exercise per Session: 0 min  Stress: No Stress Concern Present   Feeling of Stress : Not at all  Social Connections: Moderately Integrated   Frequency of Communication with Friends and Family: More than three times a week   Frequency of Social Gatherings with Friends and Family: Once a week   Attends Religious Services: More than 4 times per year   Active Member of [employer] or Organizations: Yes   Attends Archivist Meetings: 1 to 4 times per year   Marital Status: Widowed      Family History: The patient's family history includes Brain cancer in her brother; Cancer in her father; Diabetes in her sister; Heart disease in her father, mother, and sister; Hypertension in her mother; Kidney cancer in her sister; Lung cancer in her sister.  ROS:   Review of Systems  Constitution: Negative for decreased appetite, fever and weight gain.  HENT: Negative for congestion, ear discharge, hoarse voice and sore throat.   Eyes: Negative for discharge, redness, vision loss in right eye and visual halos.  Cardiovascular: Negative for chest pain, dyspnea on exertion, leg swelling, orthopnea and palpitations.  Respiratory: Negative for cough, hemoptysis, shortness of breath and snoring.   Endocrine: Negative for heat intolerance and polyphagia.  Hematologic/Lymphatic: Negative for bleeding problem. Does not bruise/bleed easily.  Skin: Negative for flushing, nail changes, rash and suspicious lesions.  Musculoskeletal: Negative for arthritis, joint pain, muscle cramps, myalgias, neck pain and stiffness.  Gastrointestinal: Negative for abdominal pain, bowel incontinence, diarrhea and excessive appetite.  Genitourinary: Negative for decreased libido, genital sores and incomplete emptying.  Neurological: Negative for brief paralysis, focal weakness, headaches and loss of balance.  Psychiatric/Behavioral: Negative for altered mental status, depression and suicidal ideas.  Allergic/Immunologic: Negative for HIV exposure and persistent infections.    EKGs/Labs/Other Studies Reviewed:    The following studies were reviewed today:   EKG:  The ekg ordered today demonstrates atrial flutter today  Zio monitor  Predominant rhythm was atrial flutter with average heart rate 92bpm and ranged from 48 to 172bpm. No sinus rhythm was present. Occasional PVCs were present.  TTE 01/18/2021 IMPRESSIONS   1. The aortic valve is calcified. Aortic valve regurgitation is mild to moderate. Mild to  moderate aortic valve sclerosis/calcification is present, without any evidence of aortic stenosis and stroke volume index  of 38.   2. Left ventricular ejection fraction, by estimation, is 60 to 65%. The  left ventricle has normal function. The left ventricle has no regional  wall motion abnormalities. Left ventricular diastolic parameters are  indeterminate due to atrial flutter.   3. Right ventricular systolic function is normal. The right ventricular  size is normal. There is normal pulmonary artery systolic pressure. The  estimated right ventricular systolic pressure is 48.0 mmHg.   4. Left atrial size was mildly dilated.   5. The mitral valve is abnormal. Mild mitral valve regurgitation. The  mean mitral valve gradient is 2.0 mmHg with average heart rate of 103 bpm.   6. The inferior vena cava is normal in size with greater than 50%  respiratory variability, suggesting right atrial  pressure of 3 mmHg.   Comparison(s): No prior Echocardiogram.   FINDINGS   Left Ventricle: Left ventricular ejection fraction, by estimation, is 60  to 65%. The left ventricle has normal function. The left ventricle has no  regional wall motion abnormalities. The left ventricular internal cavity  size was normal in size. There is   no left ventricular hypertrophy. Left ventricular diastolic parameters  are indeterminate.   Right Ventricle: The right ventricular size is normal. No increase in  right ventricular wall thickness. Right ventricular systolic function is  normal. There is normal pulmonary artery systolic pressure. The tricuspid  regurgitant velocity is 1.94 m/s, and   with an assumed right atrial pressure of 3 mmHg, the estimated right  ventricular systolic pressure is 53.6 mmHg.   Left Atrium: Left atrial size was mildly dilated.   Right Atrium: Right atrial size was normal in size.   Pericardium: There is no evidence of pericardial effusion.   Mitral Valve: The mitral valve is  abnormal. Mild mitral valve  regurgitation. The mean mitral valve gradient is 2.0 mmHg with average  heart rate of 103 bpm.   Tricuspid Valve: The tricuspid valve is grossly normal. Tricuspid valve  regurgitation is mild . No evidence of tricuspid stenosis.   Aortic Valve: The aortic valve is calcified. Aortic valve regurgitation is  mild to moderate. Mild to moderate aortic valve sclerosis/calcification is  present, without any evidence of aortic stenosis. Aortic valve mean  gradient measures 11.0 mmHg. Aortic  valve peak gradient measures 19.4 mmHg. Aortic valve area, by VTI measures  1.88 cm.   Pulmonic Valve: The pulmonic valve was grossly normal. Pulmonic valve  regurgitation is mild.   Aorta: The aortic root and ascending aorta are structurally normal, with  no evidence of dilitation.   Pulmonary Artery: The pulmonary artery is of normal size.   Venous: The inferior vena cava is normal in size with greater than 50%  respiratory variability, suggesting right atrial pressure of 3 mmHg.   IAS/Shunts: The atrial septum is grossly normal.       Recent Labs: 11/26/2020: ALT 16; TSH 1.10 04/04/2021: BUN 16; Creatinine, Ser 0.90; Hemoglobin 13.0; Magnesium 2.1; Platelets 365; Potassium 4.5; Sodium 138  Recent Lipid Panel    Component Value Date/Time   CHOL 212 (H) 11/26/2020 1019   TRIG 83.0 11/26/2020 1019   HDL 65.10 11/26/2020 1019   CHOLHDL 3 11/26/2020 1019   VLDL 16.6 11/26/2020 1019   LDLCALC 130 (H) 11/26/2020 1019    Physical Exam:    VS:  BP 122/70    Pulse (!) 123    Ht 5' (1.524 m)    Wt 175 lb 6.4 oz (79.6 kg)    SpO2 95%    BMI 34.26 kg/m     Wt Readings from Last 3 Encounters:  04/04/21 174 lb (78.9 kg)  04/04/21 175 lb 6.4 oz (79.6 kg)  04/04/21 174 lb (78.9 kg)     GEN: Well nourished, well developed in no acute distress HEENT: Normal NECK: No JVD; No carotid bruits LYMPHATICS: No lymphadenopathy CARDIAC: S1S2 noted,RRR, no murmurs, rubs,  gallops RESPIRATORY:  Clear to auscultation without rales, wheezing or rhonchi  ABDOMEN: Soft, non-tender, non-distended, +bowel sounds, no guarding. EXTREMITIES: No edema, No cyanosis, no clubbing MUSCULOSKELETAL:  No deformity  SKIN: Warm and dry NEUROLOGIC:  Alert and oriented x 3, non-focal PSYCHIATRIC:  Normal affect, good insight  ASSESSMENT:    1. Atrial flutter, unspecified type ([place])   2.  Medication management   3. Nonrheumatic aortic valve insufficiency   4. Mixed hyperlipidemia    PLAN:     She is in atrial flutter today, ZIO monitor also showed evidence of continuous atrial flutter.  We talked about rhythm control strategy today.  She is agreeable for cardioversion. Shared Decision Making/Informed Consent The risks (stroke, cardiac arrhythmias rarely resulting in the need for a temporary or permanent pacemaker, skin irritation or burns and complications associated with conscious sedation including aspiration, arrhythmia, respiratory failure and death), benefits (restoration of normal sinus rhythm) and alternatives of a direct current cardioversion were explained in detail to Martha Gilmore and she agrees to proceed.   I will advised the patient that she will continue her anticoagulation without any interruption for minimum of 4 week. Hyperlipidemia - continue with current statin medication.  Her echocardiogram showed mild to moderate aortic regurgitation not sure this information with the patient.  Repeat echocardiogram in a year.  The patient is in agreement with the above plan. The patient left the office in stable condition.  The patient will follow up in   Medication Adjustments/Labs and Tests Ordered: Current medicines are reviewed at length with the patient today.  Concerns regarding medicines are outlined above.  Orders Placed This Encounter  Procedures   Basic Metabolic Panel (BMET)   Magnesium   CBC with Differential/Platelet   Ambulatory referral to Cardiac  Electrophysiology   Meds ordered this encounter  Medications   diltiazem (CARDIZEM CD) 180 MG 24 hr capsule    Sig: Take 1 capsule (180 mg total) by mouth daily.    Dispense:  90 capsule    Refill:  3    Patient Instructions  Medication Instructions:  Your physician has recommended you make the following change in your medication:  INCREASE: Cardizem 180 mg once daily *If you need a refill on your cardiac medications before your next appointment, please call your pharmacy*   Lab Work: Your physician recommends that you return for lab work in:  TODAY: BMET, Mag, CBC If you have labs (blood work) drawn today and your tests are completely normal, you will receive your results only by: MyChart Message (if you have MyChart) OR A paper copy in the mail If you have any lab test that is abnormal or we need to change your treatment, we will call you to review the results.   Testing/Procedures: Your physician has recommended that you have a Cardioversion (DCCV). Electrical Cardioversion uses a jolt of electricity to your heart either through paddles or wired patches attached to your chest. This is a controlled, usually prescheduled, procedure. Defibrillation is done under light anesthesia in the hospital, and you usually go home the day of the procedure. This is done to get your heart back into a normal rhythm. You are not awake for the procedure. Please see the instruction sheet given to you today.  You are scheduled for a Cardioversion on December 21st with Dr. Gasper Sells.  Please arrive at the [facility] (Main Entrance A) at [facility]: [address] [place], [place] [address] at 9:30 am. (1 hour prior to procedure)  DIET: Nothing to eat or drink after midnight except a sip of water with medications (see medication instructions below)  FYI: For your safety, and to allow [place] to monitor your vital signs accurately during the surgery/procedure we request that   if you have  artificial nails, gel coating, SNS etc. Please have those removed prior to your surgery/procedure. Not having the  nail coverings /polish removed may result in cancellation or delay of your surgery/procedure.   Medication Instructions:  Continue your anticoagulant: Eliquis You will need to continue your anticoagulant after your procedure until you  are told by your provider that it is safe to stop   You must have a responsible person to drive you home and stay in the waiting area during your procedure. Failure to do so could result in cancellation.  Bring your insurance cards.  *Special Note: Every effort is made to have your procedure done on time. Occasionally there are emergencies that occur at the hospital that may cause delays. Please be patient if a delay does occur.      Follow-Up: At [facility], you and your health needs are our priority.  As part of our continuing mission to provide you with exceptional heart care, we have created designated Provider Care Teams.  These Care Teams include your primary Cardiologist (physician) and Advanced Practice Providers (APPs -  Physician Assistants and Nurse Practitioners) who all work together to provide you with the care you need, when you need it.  We recommend signing up for the patient portal called "MyChart".  Sign up information is provided on this After Visit Summary.  MyChart is used to connect with patients for Virtual Visits (Telemedicine).  Patients are able to view lab/test results, encounter notes, upcoming appointments, etc.  Non-urgent messages can be sent to your provider as well.   To learn more about what you can do with MyChart, go to [email or web address].    Your next appointment:   6 month(s)  The format for your next appointment:   In Person  Provider:   Berniece Salines, DO     Other Instructions     Adopting a Healthy Lifestyle.  Know what a healthy weight is for you (roughly BMI <25) and aim to maintain  this   Aim for 7+ servings of fruits and vegetables daily   65-80+ fluid ounces of water or unsweet tea for healthy kidneys   Limit to max 1 drink of alcohol per day; avoid smoking/tobacco   Limit animal fats in diet for cholesterol and heart health - choose grass fed whenever available   Avoid highly processed foods, and foods high in saturated/trans fats   Aim for low stress - take time to unwind and care for your mental health   Aim for 150 min of moderate intensity exercise weekly for heart health, and weights twice weekly for bone health   Aim for 7-9 hours of sleep daily   When it comes to diets, agreement about the perfect plan isnt easy to find, even among the experts. Experts at the [employer] developed an idea known as the Healthy Eating Plate. Just imagine a plate divided into logical, healthy portions.   The emphasis is on diet quality:   Load up on vegetables and fruits - one-half of your plate: Aim for color and variety, and remember that potatoes dont count.   Go for whole grains - one-quarter of your plate: Whole wheat, barley, wheat berries, quinoa, oats, brown rice, and foods made with them. If you want pasta, go with whole wheat pasta.   Protein power - one-quarter of your plate: Fish, chicken, beans, and nuts are all healthy, versatile protein sources. Limit red meat.   The diet, however, does go beyond the plate, offering a few other suggestions.   Use healthy plant oils, such as olive, canola, soy, corn, sunflower  and peanut. Check the labels, and avoid partially hydrogenated oil, which have unhealthy trans fats.   If youre thirsty, drink water. Coffee and tea are good in moderation, but skip sugary drinks and limit milk and dairy products to one or two daily servings.   The type of carbohydrate in the diet is more important than the amount. Some sources of carbohydrates, such as vegetables, fruits, whole grains, and beans-are healthier  than others.   Finally, stay active  Signed, Berniece Salines, DO  04/07/2021 8:54 PM    [facility]

## 2021-04-10 ENCOUNTER — Other Ambulatory Visit: Payer: Self-pay

## 2021-04-10 ENCOUNTER — Ambulatory Visit (INDEPENDENT_AMBULATORY_CARE_PROVIDER_SITE_OTHER): Payer: Medicare Other

## 2021-04-10 DIAGNOSIS — M1611 Unilateral primary osteoarthritis, right hip: Secondary | ICD-10-CM | POA: Diagnosis not present

## 2021-04-10 DIAGNOSIS — S76311A Strain of muscle, fascia and tendon of the posterior muscle group at thigh level, right thigh, initial encounter: Secondary | ICD-10-CM | POA: Diagnosis not present

## 2021-04-10 DIAGNOSIS — M25551 Pain in right hip: Secondary | ICD-10-CM

## 2021-04-10 DIAGNOSIS — S79911A Unspecified injury of right hip, initial encounter: Secondary | ICD-10-CM | POA: Diagnosis not present

## 2021-04-10 DIAGNOSIS — R531 Weakness: Secondary | ICD-10-CM | POA: Diagnosis not present

## 2021-04-10 IMAGING — MR MR HIP*R* W/O CM
4 of 5 series · 28 of 40 positions shown · non-contrast
Comparison: Radiographs [DATE]

CLINICAL DATA: Right hip and groin pain with weakness and limited
range of motion since falling 2 weeks ago. Osteonecrosis suspected.

EXAM:
MR OF THE RIGHT HIP WITHOUT CONTRAST
TECHNIQUE: Multiplanar, multisequence MR imaging was performed. No intravenous
contrast was administered.

[Series 3: T1 · coronal · 4.0mm · 0.85mm/px · 8 of 34 slices shown]
[im 1/34]
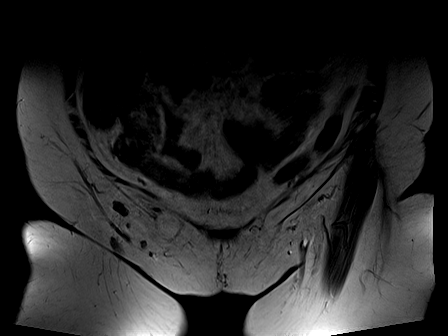
[im 4/34]
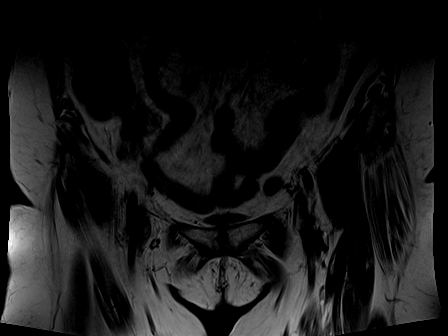
[im 12/34]
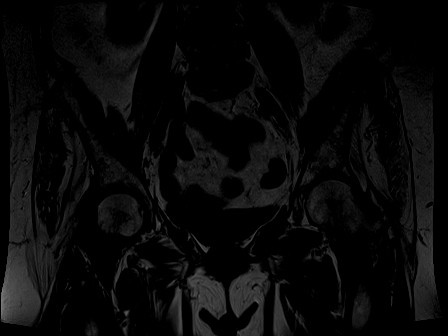
[im 15/34]
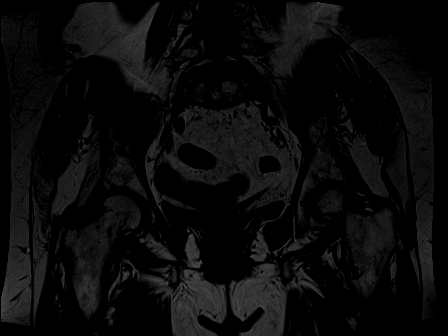
[im 19/34]
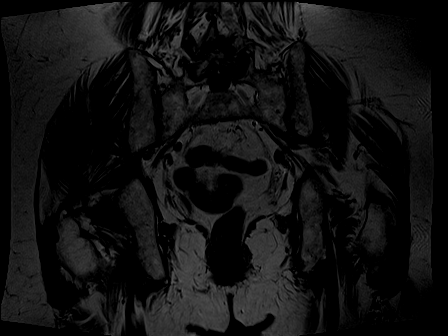
[im 23/34]
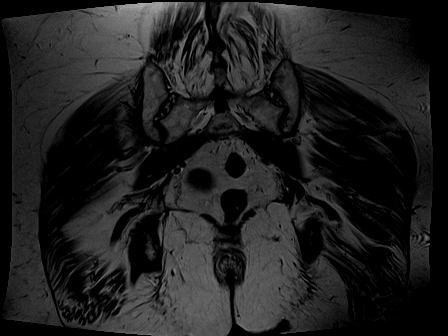
[im 30/34]
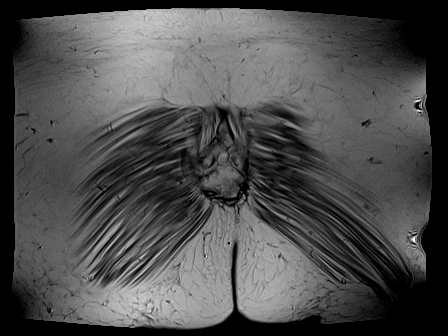
[im 34/34]
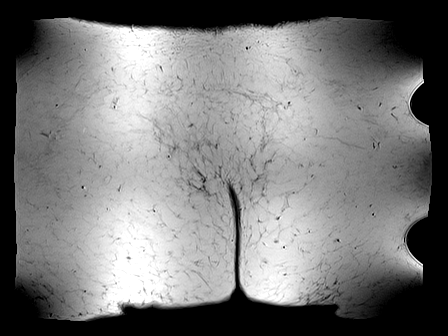

[Series 4: STIR · coronal · 4.0mm · 1.19mm/px · 8 of 34 slices shown]
[im 1/34]
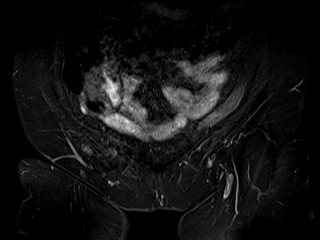
[im 4/34]
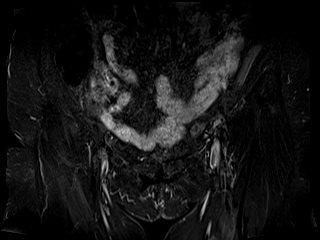
[im 12/34]
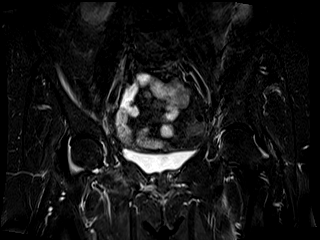
[im 15/34]
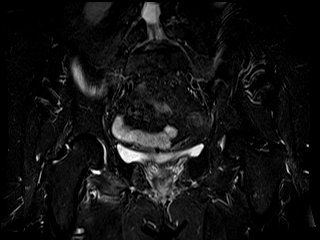
[im 19/34]
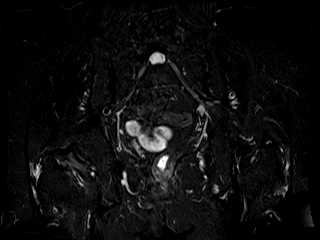
[im 23/34]
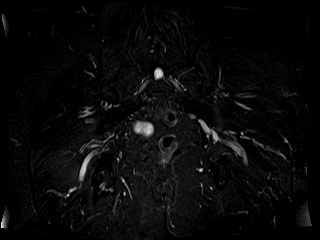
[im 30/34]
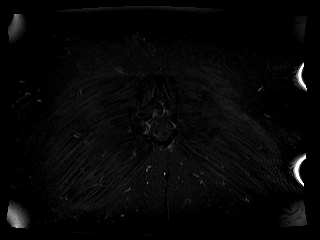
[im 34/34]
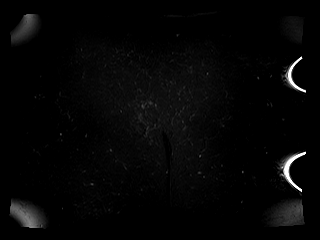

[Series 6: PD fat-sat · sagittal · 4.5mm · 0.35mm/px · 6 of 23 slices shown (1 of 2)]
[im 1/23]
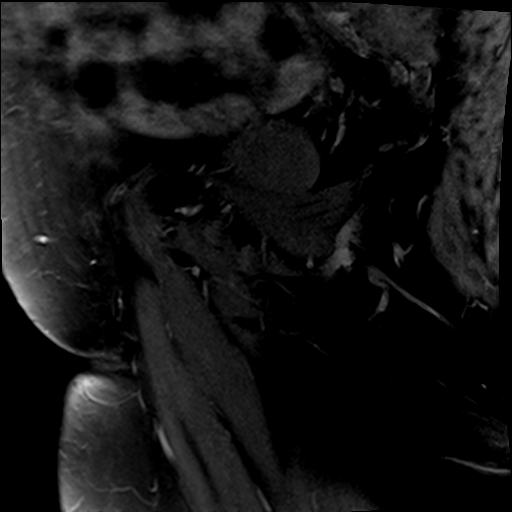
[im 5/23]
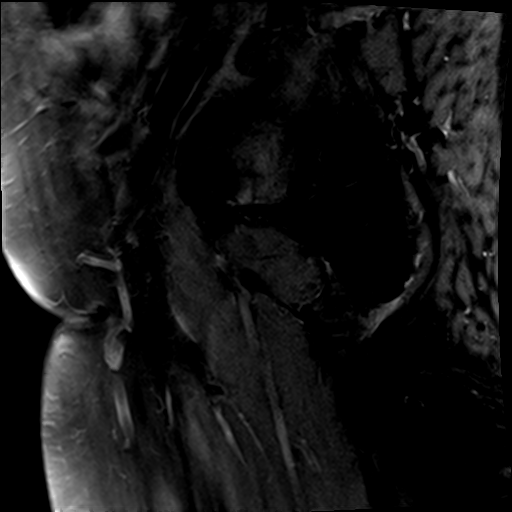
[im 9/23]
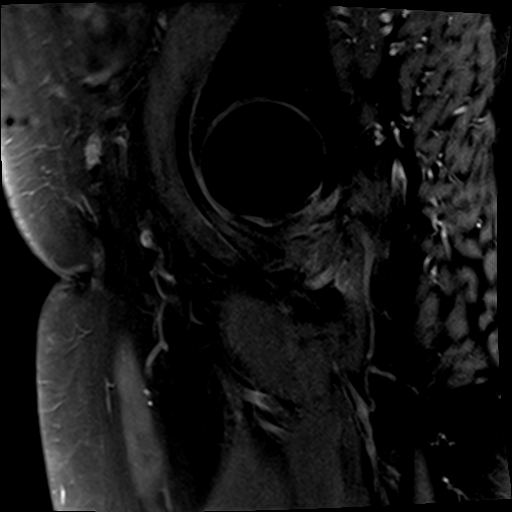
[im 14/23]
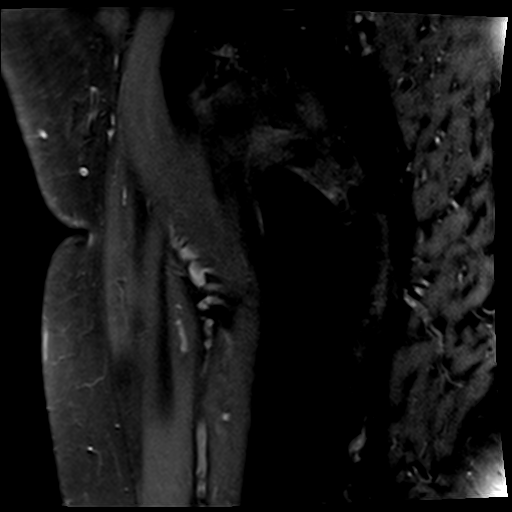
[im 18/23]
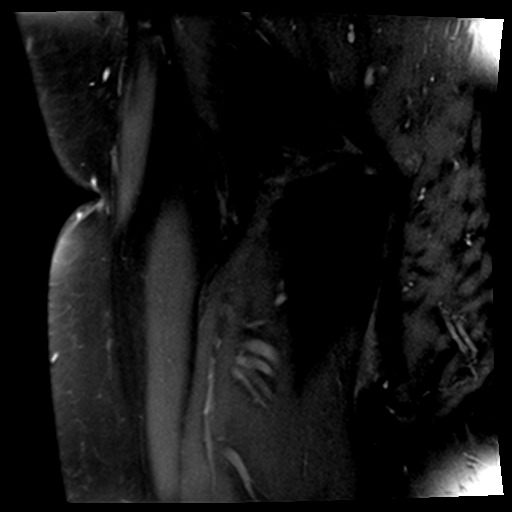
[im 23/23]
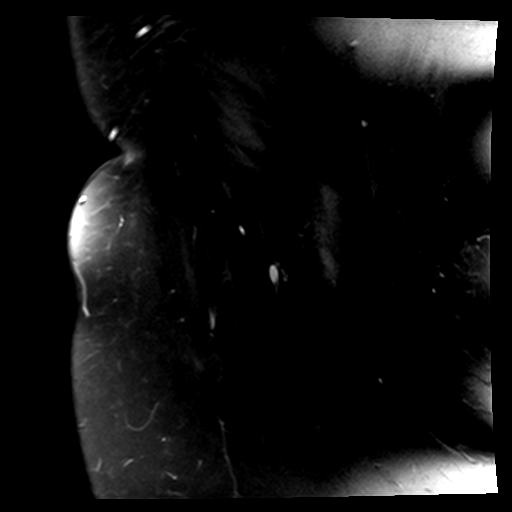

[Series 8: PD fat-sat · coronal · 4.5mm · 0.35mm/px · 6 of 23 slices shown (2 of 2)]
[im 1/23]
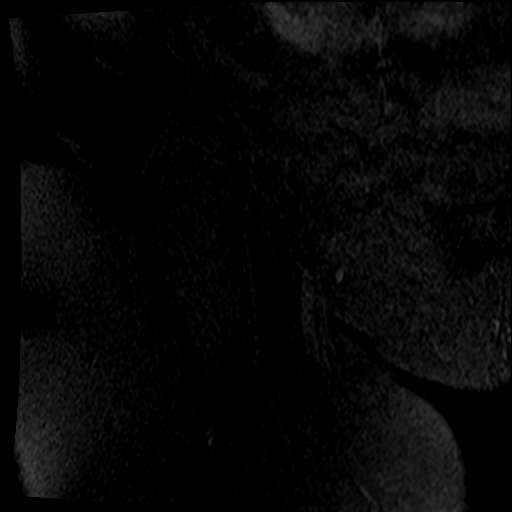
[im 5/23]
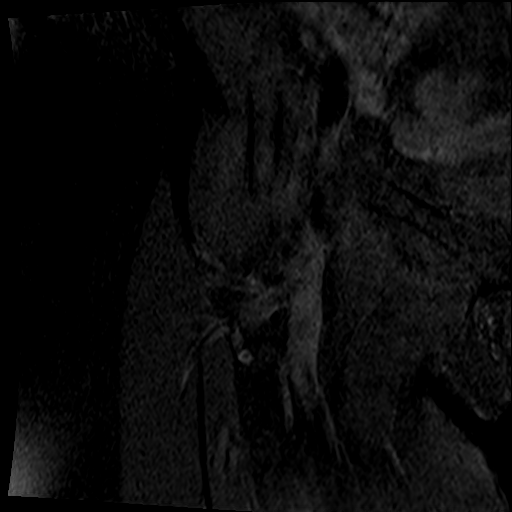
[im 9/23]
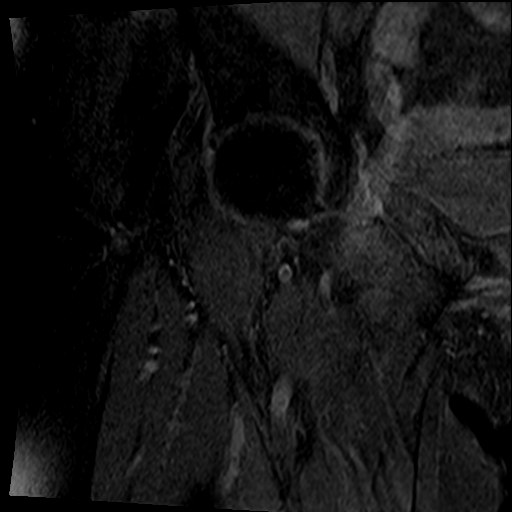
[im 14/23]
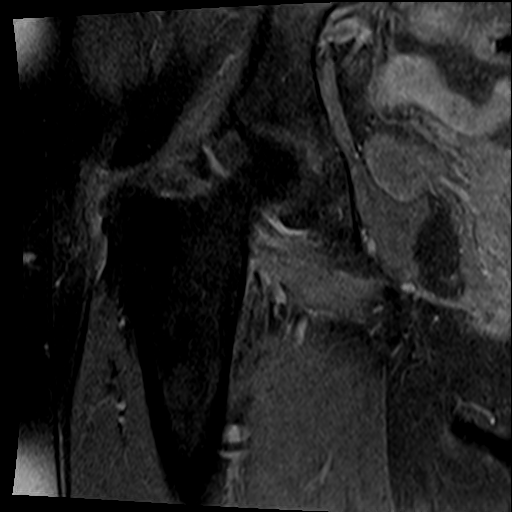
[im 18/23]
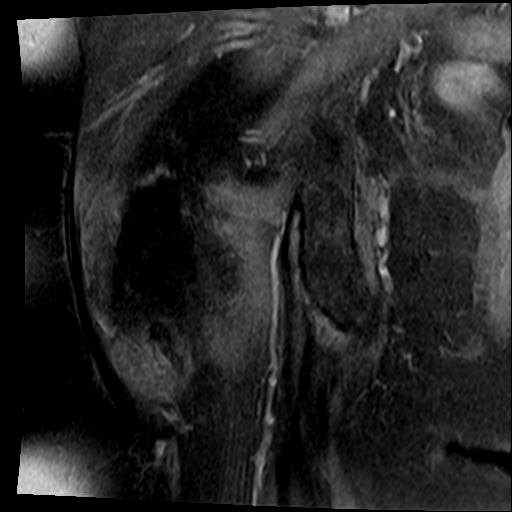
[im 23/23]
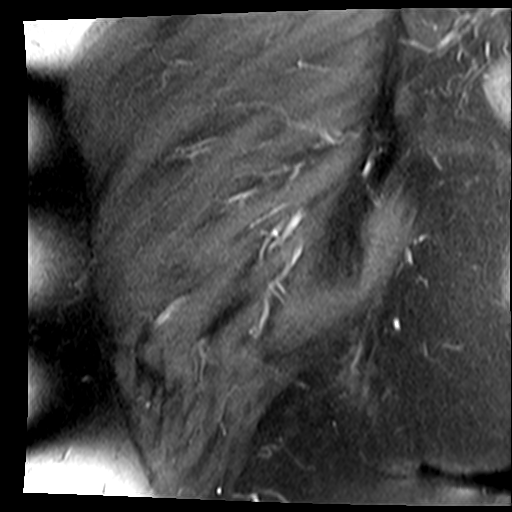

[28 of 40 positions shown; findings below may reference images not displayed]

FINDINGS: Bones: There is no evidence of acute fracture, dislocation or
avascular necrosis. The visualized bony pelvis appears normal. The
visualized sacroiliac joints and symphysis pubis appear normal.
cm T2 hyperintense lesion in the left ischium (image [DATE]) has a
nonaggressive appearance. Lower lumbar spondylosis noted without
evidence of high-grade foraminal narrowing.

Articular cartilage and labrum

Articular cartilage: Mild degenerative changes of both hips. No
focal subchondral signal abnormality.

Labrum: There is no gross labral tear or paralabral abnormality.

Joint or bursal effusion

Joint effusion: No significant hip joint effusion.

Bursae: Small amount of greater trochanteric bursal fluid, right
greater than left.

Muscles and tendons

Muscles and tendons: Mild gluteus tendinosis bilaterally. In
addition, there is asymmetric right common hamstring tendinosis and
partial tearing at the ischial origin. The visualized iliopsoas
tendons appear intact. No focal muscular atrophy identified. The
piriformis muscles appear symmetric.

Other findings

Miscellaneous: The visualized internal pelvic contents appear
unremarkable status post hysterectomy.
IMPRESSION: 1. No acute osseous findings or evidence of femoral head avascular
necrosis. Mild degenerative changes of both hips.
2. Asymmetric common hamstring tendinosis and partial tearing on the
right. Mild gluteus tendinosis and greater trochanteric bursal fluid
bilaterally.
3. Mild lower lumbar spondylosis.

## 2021-04-12 DIAGNOSIS — H353131 Nonexudative age-related macular degeneration, bilateral, early dry stage: Secondary | ICD-10-CM | POA: Diagnosis not present

## 2021-04-12 DIAGNOSIS — H16223 Keratoconjunctivitis sicca, not specified as Sjogren's, bilateral: Secondary | ICD-10-CM | POA: Diagnosis not present

## 2021-04-12 DIAGNOSIS — H26491 Other secondary cataract, right eye: Secondary | ICD-10-CM | POA: Diagnosis not present

## 2021-04-13 ENCOUNTER — Ambulatory Visit (HOSPITAL_COMMUNITY): Payer: Medicare Other | Admitting: Anesthesiology

## 2021-04-13 ENCOUNTER — Ambulatory Visit (HOSPITAL_COMMUNITY)
Admission: RE | Admit: 2021-04-13 | Discharge: 2021-04-13 | Disposition: A | Discharge status: Home / Self Care | Payer: Medicare Other | Attending: Internal Medicine | Admitting: Internal Medicine

## 2021-04-13 ENCOUNTER — Encounter (HOSPITAL_COMMUNITY)
Admission: RE | Disposition: A | Discharge status: Home / Self Care | Payer: Self-pay | Source: Home / Self Care | Attending: Internal Medicine

## 2021-04-13 ENCOUNTER — Other Ambulatory Visit: Payer: Self-pay

## 2021-04-13 ENCOUNTER — Encounter (HOSPITAL_COMMUNITY): Payer: Self-pay | Admitting: Internal Medicine

## 2021-04-13 DIAGNOSIS — I4892 Unspecified atrial flutter: Secondary | ICD-10-CM | POA: Insufficient documentation

## 2021-04-13 DIAGNOSIS — I4891 Unspecified atrial fibrillation: Secondary | ICD-10-CM | POA: Insufficient documentation

## 2021-04-13 DIAGNOSIS — I483 Typical atrial flutter: Secondary | ICD-10-CM

## 2021-04-13 HISTORY — PX: CARDIOVERSION: SHX1299

## 2021-04-13 SURGERY — CARDIOVERSION
Anesthesia: General

## 2021-04-13 MED ORDER — LIDOCAINE 2% (20 MG/ML) 5 ML SYRINGE
INTRAMUSCULAR | Status: DC | PRN
  Administered 2021-04-13: 40 mg via INTRAVENOUS

## 2021-04-13 MED ORDER — PROPOFOL 10 MG/ML IV BOLUS
INTRAVENOUS | Status: DC | PRN
  Administered 2021-04-13: 40 mg via INTRAVENOUS

## 2021-04-13 MED ORDER — SODIUM CHLORIDE 0.9 % IV SOLN: INTRAVENOUS | Status: DC

## 2021-04-13 NOTE — Anesthesia Procedure Notes (Signed)
Procedure Name: General with mask airway Date/Time: 04/13/2021 9:45 AM Performed by: Jenne Campus, CRNA Pre-anesthesia Checklist: Patient identified, Emergency Drugs available, Suction available and Patient being monitored Oxygen Delivery Method: Ambu bag Preoxygenation: Pre-oxygenation with 100% oxygen Induction Type: IV induction

## 2021-04-13 NOTE — Anesthesia Preprocedure Evaluation (Signed)
Anesthesia Evaluation   Patient awake    Reviewed: Allergy & Precautions, NPO status , Patient's Chart, lab work & pertinent test results  Airway Mallampati: I       Dental no notable dental hx.    Pulmonary    Pulmonary exam normal        Cardiovascular hypertension, Pt. on medications Normal cardiovascular exam+ dysrhythmias Atrial Fibrillation II Rhythm:Irregular Rate:Normal     Neuro/Psych PSYCHIATRIC DISORDERS Depression    GI/Hepatic GERD  Medicated and Controlled,  Endo/Other  Hypothyroidism   Renal/GU  Bladder dysfunction      Musculoskeletal   Abdominal (+) + obese,   Peds  Hematology negative hematology ROS (+)   Anesthesia Other Findings IMPRESSIONS   1. The aortic valve is calcified. Aortic valve regurgitation is mild to moderate. Mild to moderate aortic valve sclerosis/calcification is present, without any evidence of aortic stenosis and stroke volume index of 38. 2. Left ventricular ejection fraction, by estimation, is 60 to 65%. The left ventricle has normal function. The left ventricle has no regional wall motion abnormalities. Left ventricular diastolic parameters are indeterminate due to atrial flutter. 3. Right ventricular systolic function is normal. The right ventricular size is normal. There is normal pulmonary artery systolic pressure. The estimated right ventricular systolic pressure is 02.5 mmHg. 4. Left atrial size was mildly dilated. 5. The mitral valve is abnormal. Mild mitral valve regurgitation. The mean mitral valve gradient is 2.0 mmHg with average heart rate of 103 bpm. 6. The inferior vena cava is normal in size with greater than 50% respiratory variability, suggesting right atrial pressure of 3 mmHg.  Comparison(s): No prior Echocardiogram.   Reproductive/Obstetrics                             Anesthesia Physical Anesthesia Plan  ASA:  2  Anesthesia Plan: General   Post-op Pain Management:    Induction: Intravenous  PONV Risk Score and Plan: Propofol infusion  Airway Management Planned: Natural Airway and Simple Face Mask  Additional Equipment: None  Intra-op Plan:   Post-operative Plan:   Informed Consent: I have reviewed the patients History and Physical, chart, labs and discussed the procedure including the risks, benefits and alternatives for the proposed anesthesia with the patient or authorized representative who has indicated his/her understanding and acceptance.     Dental advisory given  Plan Discussed with: CRNA  Anesthesia Plan Comments:         Anesthesia Quick Evaluation

## 2021-04-13 NOTE — Discharge Instructions (Signed)

## 2021-04-13 NOTE — Progress Notes (Signed)
Right hip MRI does not show a fracture which is great news.  It does show greater trochanteric bursitis and tendinitis of the tendons around the greater trochanter.  This explains your pain and was caused probably by the fall.  Additionally you have a little bit of hamstring tendinitis.  Physical therapy should be the best treatment for this.  I can do an injection if needed.

## 2021-04-13 NOTE — Addendum Note (Signed)
Addendum  created 04/13/21 1019 by Jenne Campus, CRNA   Charge Capture section accepted

## 2021-04-13 NOTE — CV Procedure (Signed)
° °  Electrical Cardioversion Procedure Note Martha Gilmore [id number] Oct 19, 1940  Procedure: Electrical Cardioversion Indications:  Atrial Flutter  Time Out: Verified patient identification, verified procedure,medications/allergies/relevent history reviewed, required imaging and test results available.  Performed  Procedure Details  The patient was NPO after midnight. Anesthesia was administered at the beside  by Dr.Hatchett with 40 mg of lidocaine and 40 mg propofol.  Cardioversion was done with synchronized biphasic defibrillation with AP pads with 200 Joules.  The patient converted to normal sinus rhythm. The patient tolerated the procedure well   IMPRESSION:  Successful cardioversion of atrial fibrillation    Martha Gilmore Martha Gilmore 04/13/2021, 9:49 AM

## 2021-04-13 NOTE — Addendum Note (Signed)
Addendum  created 04/13/21 1040 by Jenne Campus, CRNA   Child order released for a procedure order, Clinical Note Signed, Intraprocedure Blocks edited, SmartForm saved

## 2021-04-13 NOTE — Interval H&P Note (Signed)
History and Physical Interval Note:  04/13/2021 9:30 AM  Martha Gilmore  has presented today for surgery, with the diagnosis of AFLUTTER.  The various methods of treatment have been discussed with the patient and family. After consideration of risks, benefits and other options for treatment, the patient has consented to  Procedure(s): CARDIOVERSION (N/A) as a surgical intervention.  The patient's history has been reviewed, patient examined, no change in status, stable for surgery.  I have reviewed the patient's chart and labs.  Questions were answered to the patient's satisfaction.     Martha Gilmore

## 2021-04-13 NOTE — Transfer of Care (Signed)
Immediate Anesthesia Transfer of Care Note  Patient: Martha Gilmore  Procedure(s) Performed: CARDIOVERSION  Patient Location: PACU and Endoscopy Unit  Anesthesia Type:General  Level of Consciousness: oriented, drowsy and patient cooperative  Airway & Oxygen Therapy: Patient Spontanous Breathing and Patient connected to nasal cannula oxygen  Post-op Assessment: Report given to RN and Post -op Vital signs reviewed and stable  Post vital signs: Reviewed  Last Vitals:  Vitals Value Taken Time  BP    Temp    Pulse    Resp    SpO2      Last Pain:  Vitals:   04/13/21 0847  TempSrc: Other (Comment)  PainSc: 0-No pain         Complications: No notable events documented.

## 2021-04-13 NOTE — Anesthesia Postprocedure Evaluation (Signed)
Anesthesia Post Note  Patient: Martha Gilmore  Procedure(s) Performed: CARDIOVERSION     Patient location during evaluation: Endoscopy Anesthesia Type: General Level of consciousness: awake and sedated Pain management: pain level controlled Vital Signs Assessment: post-procedure vital signs reviewed and stable Respiratory status: spontaneous breathing Cardiovascular status: stable Postop Assessment: no apparent nausea or vomiting Anesthetic complications: no   No notable events documented.  Last Vitals:  Vitals:   04/13/21 0847  BP: (!) 131/92  Pulse: (!) 116  Temp: 36.6 C  SpO2: 98%    Last Pain:  Vitals:   04/13/21 0847  TempSrc: Other (Comment)  PainSc: 0-No pain                 Huston Foley

## 2021-04-14 ENCOUNTER — Encounter: Payer: Self-pay | Admitting: Family Medicine

## 2021-04-14 ENCOUNTER — Encounter (HOSPITAL_COMMUNITY): Payer: Self-pay | Admitting: Internal Medicine

## 2021-04-15 ENCOUNTER — Other Ambulatory Visit: Payer: Self-pay | Admitting: Family Medicine

## 2021-04-15 DIAGNOSIS — N3281 Overactive bladder: Secondary | ICD-10-CM

## 2021-04-19 NOTE — Progress Notes (Signed)
I, Peterson Lombard, LAT, ATC acting as a scribe for [name], MD.  Martha Gilmore is a 80 y.o. female who presents to [place] at [facility] today for f/u R hip pain occurring from a fall and MRI review. Pt previously seen by Dr. [name] on 04/04/21 and was advised to proceed to MRI to further characterize the cause of her pain and was referred to PT, but did not complete any visit. Today, pt reports R hip is still very painful. Pt c/o increased pain getting into bed and into her car. Pt locates pain to the R groin/hip flexor area.  Dx imaging: 04/10/21 R hip MRI  04/04/21 R hip XR 10/17/19 L-spine MRI 09/24/19 L-spine XR  Pertinent review of systems: No fevers or chills  Relevant historical information: Osteopenia   Exam:  BP 140/82    Pulse 69    Ht 5' (1.524 m)    Wt 175 lb 12.8 oz (79.7 kg)    SpO2 97%    BMI 34.33 kg/m  General: Well Developed, well nourished, and in no acute distress.   MSK: Right hip normal-appearing Pain with internal rotation and flexion.  Not much pain with resisted knee flexion. Antalgic gait.    Lab and Radiology Results  Procedure: Real-time Ultrasound Guided Injection of right hip intra-articular injection femoral acetabular joint Device: Philips Affiniti 50G Images permanently stored and available for review in PACS Verbal informed consent obtained.  Discussed risks and benefits of procedure. Warned about infection bleeding damage to structures skin hypopigmentation and fat atrophy among others. Patient expresses understanding and agreement Time-out conducted.   Noted no overlying erythema, induration, or other signs of local infection.   Skin prepped in a sterile fashion.   Local anesthesia: Topical Ethyl chloride.   With sterile technique and under real time ultrasound guidance: 40 mg of Kenalog and 2 mL of Marcaine injected into hip joint. Fluid seen entering the capsule.   Completed without difficulty   Pain did not  immediately resolved suggesting pain is being generated elsewhere than the hip joint.   Advised to call if fevers/chills, erythema, induration, drainage, or persistent bleeding.   Images permanently stored and available for review in the ultrasound unit.  Impression: Technically successful ultrasound guided injection.   EXAM: MR OF THE RIGHT HIP WITHOUT CONTRAST   TECHNIQUE: Multiplanar, multisequence MR imaging was performed. No intravenous contrast was administered.   COMPARISON:  Radiographs 04/04/2021   FINDINGS: Bones: There is no evidence of acute fracture, dislocation or avascular necrosis. The visualized bony pelvis appears normal. The visualized sacroiliac joints and symphysis pubis appear normal. 1.3 cm T2 hyperintense lesion in the left ischium (image 20/5) has a nonaggressive appearance. Lower lumbar spondylosis noted without evidence of high-grade foraminal narrowing.   Articular cartilage and labrum   Articular cartilage: Mild degenerative changes of both hips. No focal subchondral signal abnormality.   Labrum: There is no gross labral tear or paralabral abnormality.   Joint or bursal effusion   Joint effusion: No significant hip joint effusion.   Bursae: Small amount of greater trochanteric bursal fluid, right greater than left.   Muscles and tendons   Muscles and tendons: Mild gluteus tendinosis bilaterally. In addition, there is asymmetric right common hamstring tendinosis and partial tearing at the ischial origin. The visualized iliopsoas tendons appear intact. No focal muscular atrophy identified. The piriformis muscles appear symmetric.   Other findings   Miscellaneous: The visualized internal pelvic contents appear unremarkable status post hysterectomy.  IMPRESSION: 1. No acute osseous findings or evidence of femoral head avascular necrosis. Mild degenerative changes of both hips. 2. Asymmetric common hamstring tendinosis and partial tearing  on the right. Mild gluteus tendinosis and greater trochanteric bursal fluid bilaterally. 3. Mild lower lumbar spondylosis.     Electronically Signed   By: Richardean Sale M.D.   On: 04/11/2021 11:50   I, [name], personally (independently) visualized and performed the interpretation of the images attached in this note.       Assessment and Plan: 80 y.o. female with right groin or hip pain.  Etiology at this time is somewhat unclear.  She is a lot of groin pain and previously did have lateral hip pain after a fall.  MRI shows some mild hip arthritis as well as a partial tear of the hamstring but does not show obvious source of her pain today.  Based clinically on suspicion that her pain is being generated intra-articular she had a trial of a diagnostic and therapeutic intra-articular injection which did not help very much immediately.  Based on her poor response advised her to return in 1 week and we will probably try to inject the hamstring insertion onto the ischial tuberosity which is the worst appearing structure in her hip region on her most recent MRI.   PDMP not reviewed this encounter. Orders Placed This Encounter  Procedures   [place] LIMITED JOINT SPACE STRUCTURES LOW RIGHT(NO LINKED CHARGES)    Standing Status:   Future    Number of Occurrences:   1    Standing Expiration Date:   10/19/2021    Order Specific Question:   Reason for Exam (SYMPTOM  OR DIAGNOSIS REQUIRED)    Answer:   right hip pain    Order Specific Question:   Preferred imaging location?    Answer:   [place]   No orders of the defined types were placed in this encounter.    Discussed warning signs or symptoms. Please see discharge instructions. Patient expresses understanding.   The above documentation has been reviewed and is accurate and complete [name], M.D.

## 2021-04-20 ENCOUNTER — Ambulatory Visit (INDEPENDENT_AMBULATORY_CARE_PROVIDER_SITE_OTHER): Payer: Medicare Other | Admitting: Family Medicine

## 2021-04-20 ENCOUNTER — Other Ambulatory Visit: Payer: Self-pay

## 2021-04-20 ENCOUNTER — Ambulatory Visit: Payer: Self-pay

## 2021-04-20 VITALS — BP 140/82 | HR 69 | Ht 60.0 in | Wt 175.8 lb

## 2021-04-20 DIAGNOSIS — M25551 Pain in right hip: Secondary | ICD-10-CM

## 2021-04-20 NOTE — Patient Instructions (Addendum)
Thank you for coming in today.   You received a steroid injection today. Seek immediate medical attention if the joint becomes red, extremely painful, or is oozing fluid.    Return in 1 week to try a different steroid injection

## 2021-04-21 NOTE — Telephone Encounter (Signed)
Patient is scheduled for CPAP Titration on 05-06-20. Patient understands his titration study will be done at [facility] sleep lab. Patient understands he will receive a letter in a week or so detailing appointment, date, time, and location.

## 2021-04-26 ENCOUNTER — Encounter: Payer: Self-pay | Admitting: Family Medicine

## 2021-04-26 NOTE — Progress Notes (Deleted)
° °  I, Wendy Poet, LAT, ATC, am serving as scribe for Dr. [name].  Martha Gilmore is a 81 y.o. female who presents to [place] at [facility] today for f/u R hip pain due to a fall.  She was last seen by Dr. [name] on 04/20/21 to review her R hip MRI and had a R hip intra-articular steroid injection.  Today, pt reports   Dx imaging: 04/10/21 R hip MRI             04/04/21 R hip XR 10/17/19 L-spine MRI 09/24/19 L-spine XR  Pertinent review of systems: ***  Relevant historical information: ***   Exam:  There were no vitals taken for this visit. General: Well Developed, well nourished, and in no acute distress.   MSK: ***    Lab and Radiology Results No results found for this or any previous visit (from the past 72 hour(s)). No results found.     Assessment and Plan: 81 y.o. female with ***   PDMP not reviewed this encounter. No orders of the defined types were placed in this encounter.  No orders of the defined types were placed in this encounter.    Discussed warning signs or symptoms. Please see discharge instructions. Patient expresses understanding.   ***

## 2021-04-27 ENCOUNTER — Ambulatory Visit: Payer: Medicare Other | Admitting: Family Medicine

## 2021-04-28 ENCOUNTER — Encounter: Payer: Self-pay | Admitting: Family Medicine

## 2021-04-28 ENCOUNTER — Encounter: Payer: Self-pay | Admitting: Cardiology

## 2021-04-28 ENCOUNTER — Other Ambulatory Visit: Payer: Self-pay

## 2021-04-28 DIAGNOSIS — E782 Mixed hyperlipidemia: Secondary | ICD-10-CM

## 2021-04-28 MED ORDER — ATORVASTATIN CALCIUM 10 MG PO TABS: ORAL_TABLET | ORAL | 3 refills | Status: DC

## 2021-04-28 MED ORDER — APIXABAN 5 MG PO TABS: 5.0000 mg | ORAL_TABLET | Freq: Two times a day (BID) | ORAL | 3 refills | Status: DC

## 2021-04-28 MED ORDER — CITALOPRAM HYDROBROMIDE 20 MG PO TABS: 20.0000 mg | ORAL_TABLET | Freq: Every day | ORAL | 3 refills | Status: DC

## 2021-04-28 MED ORDER — OMEPRAZOLE 20 MG PO CPDR: DELAYED_RELEASE_CAPSULE | ORAL | 3 refills | Status: DC

## 2021-04-29 ENCOUNTER — Encounter: Payer: Self-pay | Admitting: Cardiology

## 2021-04-29 ENCOUNTER — Ambulatory Visit: Payer: Medicare Other | Admitting: Cardiology

## 2021-04-29 ENCOUNTER — Other Ambulatory Visit: Payer: Self-pay

## 2021-04-29 VITALS — BP 134/70 | HR 63 | Ht 60.0 in | Wt 172.2 lb

## 2021-04-29 DIAGNOSIS — I483 Typical atrial flutter: Secondary | ICD-10-CM | POA: Diagnosis not present

## 2021-04-29 NOTE — Patient Instructions (Signed)
Medication Instructions:  Your physician recommends that you continue on your current medications as directed. Please refer to the Current Medication list given to you today.  *If you need a refill on your cardiac medications before your next appointment, please call your pharmacy*   Lab Work: None ordered If you have labs (blood work) drawn today and your tests are completely normal, you will receive your results only by: [employer] (if you have MyChart) OR A paper copy in the mail If you have any lab test that is abnormal or we need to change your treatment, we will call you to review the results.   Testing/Procedures: Your physician has recommended that you have an ablation. Catheter ablation is a medical procedure used to treat some cardiac arrhythmias (irregular heartbeats). During catheter ablation, a long, thin, flexible tube is put into a blood vessel in your groin (upper thigh), or neck. This tube is called an ablation catheter. It is then guided to your heart through the blood vessel. Radio frequency waves destroy small areas of heart tissue where abnormal heartbeats may cause an arrhythmia to start  The following dates are available (these are subject to change): 3/03, 3/08, 3/10, 3/15, 3/29, 3/31   Follow-Up: At [facility], you and your health needs are our priority.  As part of our continuing mission to provide you with exceptional heart care, we have created designated Provider Care Teams.  These Care Teams include your primary Cardiologist (physician) and Advanced Practice Providers (APPs -  Physician Assistants and Nurse Practitioners) who all work together to provide you with the care you need, when you need it.  We recommend signing up for the patient portal called "MyChart".  Sign up information is provided on this After Visit Summary.  MyChart is used to connect with patients for Virtual Visits (Telemedicine).  Patients are able to view lab/test results, encounter  notes, upcoming appointments, etc.  Non-urgent messages can be sent to your provider as well.   To learn more about what you can do with MyChart, go to [email or web address].    Your next appointment:   To be  determined  The format for your next appointment:   In Person  Provider:   Allegra Lai, MD    Thank you for choosing [facility]!!   Trinidad Curet, RN [phone]   Other Instructions  Atrial Flutter Atrial flutter is a type of abnormal heart rhythm (arrhythmia). The heart has an electrical system that tells it how to beat. In atrial flutter, the signals move rapidly in the top chambers of the heart (the atria). This makes your heart beat very fast. Atrial flutter can come and go, or it can be permanent. The goal of treatment is to prevent blood clots from forming, control your heart rate, or restore your heartbeat to a normal rhythm. If this condition is not treated, it can cause serious problems, such as a weakened heart muscle (cardiomyopathy) or a stroke. What are the causes? This condition is often caused by conditions that damage the heart's electrical system. These include: Heart conditions and heart surgery. These include heart attacks and open-heart surgery. Lung problems, such as COPD or a blood clot in the lung (pulmonary embolism, or PE). Poorly controlled high blood pressure (hypertension). Overactive thyroid (hyperthyroidism). Diabetes. In some cases, the cause of this condition is not known. What increases the risk? You are more likely to develop this condition if: You are an elderly adult. You are a man. You are overweight (  obese). You have obstructive sleep apnea. You have a family history of atrial flutter. You have diabetes. You drink a lot of alcohol, especially binge drinking. You use drugs, including cannabis. You smoke. What are the signs or symptoms? Symptoms of this condition include: A feeling that your heart is pounding or racing  (palpitations). Shortness of breath. Chest pain. Feeling dizzy or light-headed. Fainting. Low blood pressure (hypotension). Fatigue. Tiring easily during exercise or activity. In some cases, there are no symptoms. How is this diagnosed? This condition may be diagnosed with: An electrocardiogram (ECG) to check electrical signals of the heart. An ambulatory cardiac monitor to record your heart's activity for a few days. An echocardiogram to create pictures of your heart. A transesophageal echocardiogram (TEE) to create even better pictures of your heart. A stress test to check your blood supply while you exercise. Imaging tests, such as a CT scan or chest X-ray. Blood tests. How is this treated? Treatment depends on underlying conditions and how you feel when you experience atrial flutter. This condition may be treated with: Medicines to prevent blood clots or to treat heart rate or heart rhythm problems. Electrical cardioversion to reset the heart's rhythm. Ablation to remove the heart tissue that sends abnormal signals. Left atrial appendage closure to seal the area where blood clots can form. In some cases, underlying conditions will be treated. Follow these instructions at home: Medicines Take over-the-counter and prescription medicines only as told by your health care provider. Do not take any new medicines without talking to your health care provider. If you are taking blood thinners: Talk with your health care provider before you take any medicines that contain aspirin or NSAIDs, such as ibuprofen. These medicines increase your risk for dangerous bleeding. Take your medicine exactly as told, at the same time every day. Avoid activities that could cause injury or bruising, and follow instructions about how to prevent falls. Wear a medical alert bracelet or carry a card that lists what medicines you take. Lifestyle Eat heart-healthy foods. Talk with a dietitian to make an eating  plan that is right for you. Do not use any products that contain nicotine or tobacco, such as cigarettes, e-cigarettes, and chewing tobacco. If you need help quitting, ask your health care provider. Do not drink alcohol. Do not use drugs, including cannabis. Lose weight if you are overweight or obese. Exercise regularly as instructed by your health care provider. General instructions Do not use diet pills unless your health care provider approves. Diet pills may make heart problems worse. If you have obstructive sleep apnea, manage your condition as told by your health care provider. Keep all follow-up visits as told by your health care provider. This is important. Contact a health care provider if you: Notice a change in the rate, rhythm, or strength of your heartbeat. Are taking a blood thinner and you notice more bruising. Have a sudden change in weight. Tire more easily when you exercise or do heavy work. Get help right away if you have: Pain or pressure in your chest. Shortness of breath. Fainting. Increasing sweating with no known cause. Side effects of blood thinners, such as blood in your vomit, stool, or urine, or bleeding that cannot stop. Any symptoms of a stroke. "BE FAST" is an easy way to remember the main warning signs of a stroke: B - Balance. Signs are dizziness, sudden trouble walking, or loss of balance. E - Eyes. Signs are trouble seeing or a sudden change in vision. F -  Face. Signs are sudden weakness or numbness of the face, or the face or eyelid drooping on one side. A - Arms. Signs are weakness or numbness in an arm. This happens suddenly and usually on one side of the body. S - Speech. Signs are sudden trouble speaking, slurred speech, or trouble understanding what people say. T - Time. Time to call emergency services. Write down what time symptoms started. Other signs of a stroke, such as: A sudden, severe headache with no known cause. Nausea or  vomiting. Seizure. These symptoms may represent a serious problem that is an emergency. Do not wait to see if the symptoms will go away. Get medical help right away. Call your local emergency services (911 in the [place] Do not drive yourself to the hospital. Summary Atrial flutter is an abnormal heart rhythm that can give you symptoms of palpitations, shortness of breath, or fatigue. Atrial flutter is often treated with medicines to keep your heart in a normal rhythm and to prevent a stroke. Get help right away if you cannot catch your breath, or have chest pain or pressure. Get help right away if you have signs or symptoms of a stroke. This information is not intended to replace advice given to you by your health care provider. Make sure you discuss any questions you have with your health care provider. Document Revised: 10/02/2018 Document Reviewed: 10/02/2018 Elsevier Patient Education  [employer].   Cardiac Ablation Cardiac ablation is a procedure to destroy (ablate) some heart tissue that is sending bad signals. These bad signals cause problems in heart rhythm. The heart has many areas that make these signals. If there are problems in these areas, they can make the heart beat in a way that is not normal. Destroying some tissues can help make the heart rhythm normal. Tell your doctor about: Any allergies you have. All medicines you are taking. These include vitamins, herbs, eye drops, creams, and over-the-counter medicines. Any problems you or family members have had with medicines that make you fall asleep (anesthetics). Any blood disorders you have. Any surgeries you have had. Any medical conditions you have, such as kidney failure. Whether you are pregnant or may be pregnant. What are the risks? This is a safe procedure. But problems may occur, including: Infection. Bruising and bleeding. Bleeding into the chest. Stroke or blood clots. Damage to nearby areas of your  body. Allergies to medicines or dyes. The need for a pacemaker if the normal system is damaged. Failure of the procedure to treat the problem. What happens before the procedure? Medicines Ask your doctor about: Changing or stopping your normal medicines. This is important. Taking aspirin and ibuprofen. Do not take these medicines unless your doctor tells you to take them. Taking other medicines, vitamins, herbs, and supplements. General instructions Follow instructions from your doctor about what you cannot eat or drink. Plan to have someone take you home from the hospital or clinic. If you will be going home right after the procedure, plan to have someone with you for 24 hours. Ask your doctor what steps will be taken to prevent infection. What happens during the procedure?  An IV tube will be put into one of your veins. You will be given a medicine to help you relax. The skin on your neck or groin will be numbed. A cut (incision) will be made in your neck or groin. A needle will be put through your cut and into a large vein. A tube (catheter) will  be put into the needle. The tube will be moved to your heart. Dye may be put through the tube. This helps your doctor see your heart. Small devices (electrodes) on the tube will send out signals. A type of energy will be used to destroy some heart tissue. The tube will be taken out. Pressure will be held on your cut. This helps stop bleeding. A bandage will be put over your cut. The exact procedure may vary among doctors and hospitals. What happens after the procedure? You will be watched until you leave the hospital or clinic. This includes checking your heart rate, breathing rate, oxygen, and blood pressure. Your cut will be watched for bleeding. You will need to lie still for a few hours. Do not drive for 24 hours or as long as your doctor tells you. Summary Cardiac ablation is a procedure to destroy some heart tissue. This is done to  treat heart rhythm problems. Tell your doctor about any medical conditions you may have. Tell him or her about all medicines you are taking to treat them. This is a safe procedure. But problems may occur. These include infection, bruising, bleeding, and damage to nearby areas of your body. Follow what your doctor tells you about food and drink. You may also be told to change or stop some of your medicines. After the procedure, do not drive for 24 hours or as long as your doctor tells you. This information is not intended to replace advice given to you by your health care provider. Make sure you discuss any questions you have with your health care provider. Document Revised: 03/13/2019 Document Reviewed: 03/13/2019 Elsevier Patient Education  2022 [employer].

## 2021-04-29 NOTE — Progress Notes (Signed)
Electrophysiology Office Note   Date:  04/29/2021   ID:  Martha Gilmore, DOB 1940-07-29, MRN [id number]  PCP:  Leamon Arnt, MD  Cardiologist:  Tobb Primary Electrophysiologist:  Arick Mareno Meredith Leeds, MD    Chief Complaint: atrial flutter   History of Present Illness: Martha Gilmore is a 81 y.o. female who is being seen today for the evaluation of atrial flutter at the request of Tobb, Kardie, DO. Presenting today for electrophysiology evaluation.  She has a history significant for hyperlipidemia, depression, atrial flutter.  She had an ECG done at her primary physician's office and that showed atrial flutter.  She was in atrial flutter, she had symptoms of severe fatigue and shortness of breath.  She found it difficult to do her daily activities.  She got cardioverted in the next day she felt quite well.  She was able to do all of her daily activities with only restriction being hip pain from a prior fall.  Today, she denies symptoms of palpitations, chest pain, shortness of breath, orthopnea, PND, lower extremity edema, claudication, dizziness, presyncope, syncope, bleeding, or neurologic sequela. The patient is tolerating medications without difficulties.    Past Medical History:  Diagnosis Date   Aortic insufficiency    mild to moderate by echo 12/2020   Atrial flutter ([place]) 12/30/2020   Cataracts, bilateral 09/23/2015   Clostridium difficile infection 04/16/2017   H/o C.diff due to clindamicyin 09/2016; two rounds of treatment for cure. Dr. Benson Norway   Depression    Hyperlipidemia    Hypertension    OAB (overactive bladder) 12/16/2013   Osteopenia 08/05/2012   Dexa scan 07/2012 mild osteopenia, T = -1.1   Seasonal allergies    Urge and stress incontinence    Past Surgical History:  Procedure Laterality Date   APPENDECTOMY  1971   CARDIOVERSION N/A 04/13/2021   Procedure: CARDIOVERSION;  Surgeon: Werner Lean, MD;  Location: [facility];  Service:  Cardiovascular;  Laterality: N/A;   CESAREAN SECTION     CHOLECYSTECTOMY     Teeth Implants     TRIGGER FINGER RELEASE     VAGINAL HYSTERECTOMY       Current Outpatient Medications  Medication Sig Dispense Refill   apixaban (ELIQUIS) 5 MG TABS tablet Take 1 tablet (5 mg total) by mouth 2 (two) times daily. 180 tablet 3   atorvastatin (LIPITOR) 10 MG tablet TAKE 1 TABLET BY MOUTH EVERYDAY AT BEDTIME 90 tablet 3   citalopram (CELEXA) 20 MG tablet Take 1 tablet (20 mg total) by mouth daily. 90 tablet 3   diltiazem (CARDIZEM CD) 180 MG 24 hr capsule Take 1 capsule (180 mg total) by mouth daily. 90 capsule 3   gabapentin (NEURONTIN) 600 MG tablet Take 0.5 tablets (300 mg total) by mouth at bedtime. 45 tablet 3   levothyroxine (SYNTHROID) 100 MCG tablet Take 1 tablet (100 mcg total) by mouth daily. 90 tablet 3   Multiple Vitamins-Minerals (PRESERVISION AREDS PO) Take 1 capsule by mouth in the morning and at bedtime.     omeprazole (PRILOSEC) 20 MG capsule TAKE 1 CAPSULE BY MOUTH EVERY DAY 90 capsule 3   oxybutynin (DITROPAN-XL) 10 MG 24 hr tablet TAKE ONE TABLET BY MOUTH DAILY AT BEDTIME 90 tablet 0   Polyethyl Glycol-Propyl Glycol (SYSTANE OP) Place 1 drop into both eyes daily as needed (DRY EYES).     traMADol (ULTRAM) 50 MG tablet Take 1 tablet (50 mg total) by mouth 3 (three) times daily as needed. 60 tablet  2   No current facility-administered medications for this visit.    Allergies:   Aspirin, Meperidine, Penicillins, Statins, Cephalexin, Clindamycin/lincomycin, Demerol  [meperidine hcl], and Tape   Social History:  The patient  reports that she has never smoked. She has never used smokeless tobacco. She reports that she does not drink alcohol and does not use drugs.   Family History:  The patient's family history includes Brain cancer in her brother; Cancer in her father; Diabetes in her sister; Heart disease in her father, mother, and sister; Hypertension in her mother; Kidney cancer  in her sister; Lung cancer in her sister.    ROS:  Please see the history of present illness.   Otherwise, review of systems is positive for none.   All other systems are reviewed and negative.    PHYSICAL EXAM: VS:  BP 134/70    Pulse 63    Ht 5' (1.524 m)    Wt 172 lb 3.2 oz (78.1 kg)    SpO2 97%    BMI 33.63 kg/m  , BMI Body mass index is 33.63 kg/m. GEN: Well nourished, well developed, in no acute distress  HEENT: normal  Neck: no JVD, carotid bruits, or masses Cardiac: RRR; no murmurs, rubs, or gallops,no edema  Respiratory:  clear to auscultation bilaterally, normal work of breathing GI: soft, nontender, nondistended, + BS MS: no deformity or atrophy  Skin: warm and dry Neuro:  Strength and sensation are intact Psych: euthymic mood, full affect  EKG:  EKG is ordered today. Personal review of the ekg ordered shows sinus rhythm  Recent Labs: 11/26/2020: ALT 16; TSH 1.10 04/04/2021: BUN 16; Creatinine, Ser 0.90; Hemoglobin 13.0; Magnesium 2.1; Platelets 365; Potassium 4.5; Sodium 138    Lipid Panel     Component Value Date/Time   CHOL 212 (H) 11/26/2020 1019   TRIG 83.0 11/26/2020 1019   HDL 65.10 11/26/2020 1019   CHOLHDL 3 11/26/2020 1019   VLDL 16.6 11/26/2020 1019   LDLCALC 130 (H) 11/26/2020 1019     Wt Readings from Last 3 Encounters:  04/29/21 172 lb 3.2 oz (78.1 kg)  04/20/21 175 lb 12.8 oz (79.7 kg)  04/13/21 173 lb 15.1 oz (78.9 kg)      Other studies Reviewed: Additional studies/ records that were reviewed today include: TTE 01/18/21  Review of the above records today demonstrates:   1. The aortic valve is calcified. Aortic valve regurgitation is mild to  moderate. Mild to moderate aortic valve sclerosis/calcification is  present, without any evidence of aortic stenosis and stroke volume index  of 38.   2. Left ventricular ejection fraction, by estimation, is 60 to 65%. The  left ventricle has normal function. The left ventricle has no regional   wall motion abnormalities. Left ventricular diastolic parameters are  indeterminate due to atrial flutter.   3. Right ventricular systolic function is normal. The right ventricular  size is normal. There is normal pulmonary artery systolic pressure. The  estimated right ventricular systolic pressure is 85.6 mmHg.   4. Left atrial size was mildly dilated.   5. The mitral valve is abnormal. Mild mitral valve regurgitation. The  mean mitral valve gradient is 2.0 mmHg with average heart rate of 103 bpm.   6. The inferior vena cava is normal in size with greater than 50%  respiratory variability, suggesting right atrial pressure of 3 mmHg.  Cardiac monitor 12/12/2020 personally reviewed Predominant rhythm was atrial flutter with average heart rate 92bpm and ranged from  48 to 172bpm. No sinus rhythm was present. Occasional PVCs were present  ASSESSMENT AND PLAN:  1.  Typical atrial flutter: CHA2DS2-VASc of 3.  Currently on Eliquis 5 mg twice daily for cardiac monitor showed that showed continuous atrial flutter.  She is now status post cardioversion.  I do think that she Martha Gilmore agree to ablation.  We Martha Gilmore give her information on this and she Martha Siordia discuss with her family further.  Case discussed with primary cardiology  Current medicines are reviewed at length with the patient today.   The patient does not have concerns regarding her medicines.  The following changes were made today:  none  Labs/ tests ordered today include:  Orders Placed This Encounter  Procedures   EKG 12-Lead     Disposition:   FU with Yanna Leaks 3 months  Signed, Efe Fazzino Meredith Leeds, MD  04/29/2021 1:41 PM     [facility] [address] [place] [address] [phone] (office) [phone] (fax)

## 2021-05-02 ENCOUNTER — Other Ambulatory Visit: Payer: Self-pay | Admitting: Family Medicine

## 2021-05-02 ENCOUNTER — Ambulatory Visit: Payer: Medicare Other | Admitting: Family Medicine

## 2021-05-02 ENCOUNTER — Ambulatory Visit (INDEPENDENT_AMBULATORY_CARE_PROVIDER_SITE_OTHER): Payer: Medicare Other | Admitting: Family Medicine

## 2021-05-02 ENCOUNTER — Encounter: Payer: Self-pay | Admitting: Family Medicine

## 2021-05-02 ENCOUNTER — Other Ambulatory Visit: Payer: Self-pay

## 2021-05-02 VITALS — BP 150/80 | HR 64 | Temp 97.3°F | Ht 60.0 in | Wt 171.6 lb

## 2021-05-02 DIAGNOSIS — I483 Typical atrial flutter: Secondary | ICD-10-CM

## 2021-05-02 DIAGNOSIS — F339 Major depressive disorder, recurrent, unspecified: Secondary | ICD-10-CM

## 2021-05-02 DIAGNOSIS — M45A1 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: Secondary | ICD-10-CM | POA: Insufficient documentation

## 2021-05-02 DIAGNOSIS — M5137 Other intervertebral disc degeneration, lumbosacral region: Secondary | ICD-10-CM

## 2021-05-02 DIAGNOSIS — G4733 Obstructive sleep apnea (adult) (pediatric): Secondary | ICD-10-CM

## 2021-05-02 DIAGNOSIS — E890 Postprocedural hypothyroidism: Secondary | ICD-10-CM | POA: Diagnosis not present

## 2021-05-02 DIAGNOSIS — Z1231 Encounter for screening mammogram for malignant neoplasm of breast: Secondary | ICD-10-CM

## 2021-05-02 DIAGNOSIS — E782 Mixed hyperlipidemia: Secondary | ICD-10-CM

## 2021-05-02 DIAGNOSIS — Z0000 Encounter for general adult medical examination without abnormal findings: Secondary | ICD-10-CM

## 2021-05-02 LAB — LIPID PANEL
Cholesterol: 200 mg/dL (ref 0–200)
HDL: 67.2 mg/dL (ref >39.00)
LDL Cholesterol: 122 mg/dL — ABNORMAL HIGH (ref 0–99)
NonHDL: 133.04
Total CHOL/HDL Ratio: 3
Triglycerides: 57 mg/dL (ref 0.0–149.0)
VLDL: 11.4 mg/dL (ref 0.0–40.0)

## 2021-05-02 LAB — COMPREHENSIVE METABOLIC PANEL
ALT: 13 U/L (ref 0–35)
AST: 17 U/L (ref 0–37)
Albumin: 4 g/dL (ref 3.5–5.2)
Alkaline Phosphatase: 89 U/L (ref 39–117)
BUN: 21 mg/dL (ref 6–23)
CO2: 31 mEq/L (ref 19–32)
Calcium: 9.6 mg/dL (ref 8.4–10.5)
Chloride: 103 mEq/L (ref 96–112)
Creatinine, Ser: 0.94 mg/dL (ref 0.40–1.20)
GFR: 57.58 mL/min — ABNORMAL LOW (ref >60.00)
Glucose, Bld: 79 mg/dL (ref 70–99)
Potassium: 4.7 mEq/L (ref 3.5–5.1)
Sodium: 139 mEq/L (ref 135–145)
Total Bilirubin: 0.8 mg/dL (ref 0.2–1.2)
Total Protein: 6.9 g/dL (ref 6.0–8.3)

## 2021-05-02 LAB — TSH: TSH: 6.68 u[IU]/mL — ABNORMAL HIGH (ref 0.35–5.50)

## 2021-05-02 LAB — CBC WITH DIFFERENTIAL/PLATELET
Basophils Absolute: 0.1 10*3/uL (ref 0.0–0.1)
Basophils Relative: 0.8 % (ref 0.0–3.0)
Eosinophils Absolute: 0.2 10*3/uL (ref 0.0–0.7)
Eosinophils Relative: 2 % (ref 0.0–5.0)
HCT: 40.5 % (ref 36.0–46.0)
Hemoglobin: 13.2 g/dL (ref 12.0–15.0)
Lymphocytes Relative: 22.5 % (ref 12.0–46.0)
Lymphs Abs: 1.8 10*3/uL (ref 0.7–4.0)
MCHC: 32.7 g/dL (ref 30.0–36.0)
MCV: 88.2 fl (ref 78.0–100.0)
Monocytes Absolute: 0.6 10*3/uL (ref 0.1–1.0)
Monocytes Relative: 8.2 % (ref 3.0–12.0)
Neutro Abs: 5.3 10*3/uL (ref 1.4–7.7)
Neutrophils Relative %: 66.5 % (ref 43.0–77.0)
Platelets: 272 10*3/uL (ref 150.0–400.0)
RBC: 4.59 Mil/uL (ref 3.87–5.11)
RDW: 14.6 % (ref 11.5–15.5)
WBC: 7.9 10*3/uL (ref 4.0–10.5)

## 2021-05-02 NOTE — Patient Instructions (Signed)
Please return in 6 months to recheck blood pressure  I will release your lab results to you on your MyChart account with further instructions. Please reply with any questions.    I have ordered a mammogram and/or bone density for you as we discussed today: [x]   Mammogram  []   Bone Density  Please call the office checked below to schedule your appointment:  [x]   [facility]      [place], [place]         []   [facility]  [place], [place]   If you have any questions or concerns, please don't hesitate to send me a message via MyChart or call the office at [phone]. Thank you for visiting with [place] today! It's our pleasure caring for you.

## 2021-05-02 NOTE — Progress Notes (Signed)
Subjective  Chief Complaint  Patient presents with   Annual Exam    Fasting   Hypothyroidism   Hyperlipidemia   Atrial Flutter    HPI: Martha Gilmore is a 81 y.o. female who presents to [facility] today for a Female Wellness Visit. She also has the concerns and/or needs as listed above in the chief complaint. These will be addressed in addition to the Health Maintenance Visit.   Wellness Visit: annual visit with health maintenance review and exam without Pap  Health maintenance: 81 year old female, widowed.  Overall doing well.  Mammogram now due.  Colorectal cancer screening up-to-date.  Immunizations are up-to-date. Chronic disease f/u and/or acute problem visit: (deemed necessary to be done in addition to the wellness visit): Hypothyroidism: Feels well.  Takes medicines daily.  No symptoms of high or low thyroid.  Due for recheck. Osteoarthritis, cervical and lumbosacral: Seen sports medicine Ortho.  Reviewed notes.  Fortunately mildly improved.  Intermittent tramadol use for pain.  To start physical therapy tomorrow for balance and back pain.  No radicular symptoms. Atrial flutter now on Cardizem and Eliquis: Reviewed recent cardiology notes.  Recommended to have ablation.  She is thinking about it.  No chest pain or palpitations. Chronic depression: Stable on antidepressant. HLD On lipitor 10 tolerating well.  Fasting for recheck. Obstructive sleep apnea new diagnosis  Assessment  1. Annual physical exam   2. Postablative hypothyroidism   3. DDD (degenerative disc disease), lumbosacral   4. Typical atrial flutter ([place])   5. Major depression, recurrent, chronic ([place])   6. Mixed hyperlipidemia   7. Obstructive sleep apnea   8. 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 Included topics are  cancer screening recommendations, ways to keep healthy (see AVS) including dietary and exercise recommendations, regular eye and dental care, use of seat belts, and avoidance of moderate alcohol use and tobacco use.  Patient to schedule mammogram BMI: discussed patient's BMI and encouraged positive lifestyle modifications to help get to or maintain a target BMI. HM needs and immunizations were addressed and ordered. See below for orders. See HM and immunization section for updates. Routine labs and screening tests ordered including cmp, cbc and lipids where appropriate. Discussed recommendations regarding Vit D and calcium supplementation (see AVS)  Chronic disease management visit and/or acute problem visit: Hypothyroidism: Clinically euthyroid.  Recheck today. Continue sports medicine and physical therapy for back and neck pain.  Tramadol refills are current.  Uses 2-3 times weekly.  This is reasonable. Recheck lipids on Lipitor 10 Elevated blood pressure today.  Reviewed records.  No history of hypertension.  Will monitor, recheck in 6 months.  Continue Cardizem for now.  This is for a flutter A flutter on Eliquis and Cardizem: To schedule ablation therapy Depression is stable.continue celexa 20  Follow up: 6 months to recheck blood pressure Orders Placed This Encounter  Procedures   CBC with Differential/Platelet   Comprehensive metabolic panel   Lipid panel   TSH   No orders of the defined types were placed in this encounter.     Body mass index is 33.51 kg/m. Wt Readings from Last 3 Encounters:  05/02/21 171 lb 9.6 oz (77.8 kg)  04/29/21 172 lb 3.2 oz (78.1 kg)  04/20/21 175 lb 12.8 oz (79.7 kg)     Patient Active Problem List   Diagnosis  Date Noted   Obstructive sleep apnea 03/28/2021    Priority: High    Sleep study positive 02/2021    Atrial flutter ([place]) 12/30/2020    Priority: High    Cards eval 12/2020; started anticoag and cardizem. Work up ongoing. Dr. Verl Blalock     Postablative hypothyroidism 12/20/2015    Priority: High   Chondromalacia, knee, right 11/05/2017    Priority: Medium    Osteoarthrosis, localized, primary, knee, right 11/05/2017    Priority: Medium    Major depression, recurrent, chronic ([place]) 04/16/2017    Priority: Medium    Mixed hyperlipidemia 04/16/2017    Priority: Medium    History of Graves' disease 03/22/2015    Priority: Medium     Overview:  Treated with RAI-131 01/2015 Dr Steffanie Dunn    GERD (gastroesophageal reflux disease) 12/13/2012    Priority: Medium     EGD 09/2016 - Dr. Benson Norway, normal    Osteopenia 08/05/2012    Priority: Medium     DEXA 07/2020: lowest T = - 2.1, osteopenia. Recheck 3 years.  DEXA 04/2018: stable osteopenia. Recheck 2-3 years.  DEXA 01/2015: T = -1.5, osteopenia; rec calcium and vit D and exercise. Recheck 2-3 years. 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 Dr. Benson Norway    Family history of early CAD 04/16/2017    Treadmill test 04/2017 - equivocal: didn't reach maximal HR; otherwise negative.     Health Maintenance  Topic Date Due   MAMMOGRAM  06/02/2021  (Originally 04/30/2021)   DEXA SCAN  08/18/2023   Pneumonia Vaccine 87+ Years old  Completed   INFLUENZA VACCINE  Completed   COVID-19 Vaccine  Completed   Zoster Vaccines- Shingrix  Completed   HPV VACCINES  Aged Out   TETANUS/TDAP  Discontinued   Hepatitis C Screening  Discontinued   Immunization History  Administered Date(s) Administered   Fluad Quad(high Dose 65+) 01/13/2021   Influenza Split 01/15/2012   Influenza, High Dose Seasonal PF 01/30/2013, 01/05/2015, 02/17/2016, 02/02/2017, 01/18/2018, 12/11/2018, 12/23/2019, 01/13/2021   Influenza, Seasonal, Injecte, Preservative Fre 01/21/2014   Influenza-Unspecified 01/09/2011, 01/15/2012, 01/21/2014, 01/22/2018   PFIZER(Purple Top)SARS-COV-2 Vaccination 05/15/2019, 06/05/2019, 01/20/2020, 08/16/2020   Pfizer Covid-19 Vaccine Bivalent Booster 24yrs & up 01/14/2021   Pneumococcal Conjugate-13 01/05/2015   Pneumococcal Polysaccharide-23 04/24/2006, 11/18/2007, 05/30/2012   Tdap 08/17/2010   Zoster Recombinat (Shingrix) 06/29/2018, 11/01/2018   Zoster, Live 04/25/1995   We updated and reviewed the patient's past history in detail and it is documented below. Allergies: Patient is allergic to aspirin, meperidine, penicillins, statins, cephalexin, clindamycin/lincomycin, demerol  [meperidine hcl], and tape. Past Medical History Patient  has a  past medical history of Aortic insufficiency, Atrial flutter ([place]) (12/30/2020), Cataracts, bilateral (09/23/2015), Clostridium difficile infection (04/16/2017), Depression, Hyperlipidemia, Hypertension, OAB (overactive bladder) (12/16/2013), Osteopenia (08/05/2012), Seasonal allergies, and Urge and stress incontinence. Past Surgical History Patient  has a past surgical history that includes Cholecystectomy; Cesarean section; Teeth Implants; Appendectomy (1971); Trigger finger release; Vaginal hysterectomy; and Cardioversion (N/A, 04/13/2021). Family History: Patient family history includes Brain  cancer in her brother; Cancer in her father; Diabetes in her sister; Heart disease in her father, mother, and sister; Hypertension in her mother; Kidney cancer in her sister; Lung cancer in her sister. Social History:  Patient  reports that she has never smoked. She has never used smokeless tobacco. She reports that she does not drink alcohol and does not use drugs.  Review of Systems: Constitutional: negative for fever or malaise Ophthalmic: negative for photophobia, double vision or loss of vision Cardiovascular: negative for chest pain, dyspnea on exertion, or new LE swelling Respiratory: negative for SOB or persistent cough Gastrointestinal: negative for abdominal pain, change in bowel habits or melena Genitourinary: negative for dysuria or gross hematuria, no abnormal uterine bleeding or disharge Musculoskeletal: negative for new gait disturbance or muscular weakness Integumentary: negative for new or persistent rashes, no breast lumps Neurological: negative for TIA or stroke symptoms Psychiatric: negative for SI or delusions Allergic/Immunologic: negative for hives  Patient Care Team    Relationship Specialty Notifications Start End  Leamon Arnt, MD PCP - General Family Medicine  03/21/16   Berniece Salines, DO PCP - Cardiology Cardiology  12/29/20   Susette Racer, MD Consulting Physician Endocrinology  04/16/17   Carol Ada, MD Consulting Physician Gastroenterology  04/16/17   Roseanne Kaufman, MD Consulting Physician Orthopedic Surgery  02/28/18   Rolm Bookbinder, MD Consulting Physician Dermatology  02/28/18   Stephannie Li, [place] Physician Ophthalmology  03/17/19   [facility] Consulting Physician Audiology  03/17/19   Madelin Rear, [facility] Pharmacist Pharmacist  07/29/20    Comment: Phone [phone]  Reece Agar, MD Consulting Physician Pain Medicine  03/02/21     Objective  Vitals: BP (!) 150/80    Pulse 64    Temp (!) 97.3 F (36.3 C)  (Temporal)    Ht 5' (1.524 m)    Wt 171 lb 9.6 oz (77.8 kg)    SpO2 98%    BMI 33.51 kg/m  General:  Well developed, well nourished, no acute distress  Psych:  Alert and orientedx3,normal mood and affect HEENT:  Normocephalic, atraumatic, non-icteric sclera,  supple neck without adenopathy, mass or thyromegaly Cardiovascular:  Normal S1, S2, RRR without gallop, rub + soft systolic murmur, no edema Respiratory:  Good breath sounds bilaterally, CTAB with normal respiratory effort Gastrointestinal: normal bowel sounds, soft, non-tender, no noted masses. No HSM MSK: no deformities, contusions. Joints are without erythema or swelling.  Scoliosis present Skin:  Warm, no rashes or suspicious lesions noted Neuro: Nonfocal, no tremor   Commons side effects, risks, benefits, and alternatives for medications and treatment plan prescribed today were discussed, and the patient expressed understanding of the given instructions. Patient is instructed to call or message via MyChart if he/she has any questions or concerns regarding our treatment plan. No barriers to understanding were identified. We discussed Red Flag symptoms and signs in detail. Patient expressed understanding regarding what to do in case of urgent or emergency type symptoms.  Medication list was reconciled, printed and provided to the patient in AVS. Patient instructions and  summary information was reviewed with the patient as documented in the AVS. This note was prepared with assistance of Dragon voice recognition software. Occasional wrong-word or sound-a-like substitutions may have occurred due to the inherent limitations of voice recognition software  This visit occurred during the SARS-CoV-2 public health emergency.  Safety protocols were in place, including screening questions prior to the visit, additional usage of staff PPE, and extensive cleaning of exam room while observing appropriate contact time as indicated for disinfecting  solutions.

## 2021-05-03 ENCOUNTER — Telehealth: Payer: Self-pay | Admitting: Family Medicine

## 2021-05-03 ENCOUNTER — Ambulatory Visit: Payer: Medicare Other | Admitting: Physical Therapy

## 2021-05-03 ENCOUNTER — Encounter: Payer: Self-pay | Admitting: Physical Therapy

## 2021-05-03 ENCOUNTER — Other Ambulatory Visit: Payer: Self-pay

## 2021-05-03 DIAGNOSIS — M25551 Pain in right hip: Secondary | ICD-10-CM | POA: Diagnosis not present

## 2021-05-03 DIAGNOSIS — M6281 Muscle weakness (generalized): Secondary | ICD-10-CM

## 2021-05-03 DIAGNOSIS — R262 Difficulty in walking, not elsewhere classified: Secondary | ICD-10-CM | POA: Diagnosis not present

## 2021-05-03 DIAGNOSIS — E890 Postprocedural hypothyroidism: Secondary | ICD-10-CM

## 2021-05-03 NOTE — Therapy (Signed)
OUTPATIENT PHYSICAL THERAPY LOWER EXTREMITY EVALUATION   Patient Name: Marcina San Marino MRN: [id number] DOB:April 19, 1941, 81 y.o., female Today's Date: 05/03/2021   PT End of Session - 05/03/21 1116     Visit Number 1    Number of Visits 16    Authorization Type [employer] $20 co pay    Progress Note Due on Visit 10    PT Start Time 1104    PT Stop Time 1145    PT Time Calculation (min) 41 min    Activity Tolerance Patient tolerated treatment well             Past Medical History:  Diagnosis Date   Aortic insufficiency    mild to moderate by echo 12/2020   Atrial flutter ([place]) 12/30/2020   Cataracts, bilateral 09/23/2015   Clostridium difficile infection 04/16/2017   H/o C.diff due to clindamicyin 09/2016; two rounds of treatment for cure. Dr. 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 ([place]) 05/02/2021   Nonrheumatic aortic valve insufficiency 04/07/2021   Obstructive sleep apnea 03/28/2021   Aortic insufficiency 01/18/2021   Atrial flutter ([place]) 12/30/2020   Positive ANA (antinuclear antibody) 10/26/2020   Chondromalacia, knee, right 11/05/2017   Osteoarthrosis, localized, primary, knee, right 11/05/2017   Major depression, recurrent, chronic ([place]) 04/16/2017   Mixed hyperlipidemia 04/16/2017   Clostridium difficile  infection 04/16/2017   Family history of early CAD 04/16/2017   Bilateral sensorineural hearing loss 02/06/2016   Postablative hypothyroidism 12/20/2015   History of Graves' disease 03/22/2015   OAB (overactive bladder) 12/16/2013   GERD (gastroesophageal reflux disease) 12/13/2012   Osteoarthritis of CMC joint of thumb 08/13/2012   Seasonal allergies 08/13/2012   Urge and stress incontinence 08/13/2012   Osteopenia 08/05/2012   DDD (degenerative disc disease), lumbosacral 10/22/2010    PCP: Leamon Arnt, MD  REFERRING PROVIDER: [name], MD   REFERRING DIAG: [phone] (ICD-10-CM) - Right hip pain  THERAPY DIAG:  Right hip pain  Muscle weakness (generalized)  Difficulty in walking, not elsewhere classified  ONSET DATE: 03/04/21  SUBJECTIVE:   SUBJECTIVE STATEMENT Royann presents with c/o right leg and groin area pain that has been onset for a month. States the pain initially started on November 11th following her trying to scoot over in her bed which led to pt believing she pulled a muscle. Due to this, pt was applying heat and ice to her leg which provided minor relief.    About two weeks later after her initial pain 03/21/21, Brittinee states she had fallen onto her right hip after trying to sit down but missing the chair. Since then, she has had trouble walking and has been using a cane that belonged to her late husband. She does admit to feeling some numbness in her leg  at times, mainly at night. Pt has been taking tramadol 50 mg daily and gabapentin 300 mg daily, due to her cervical spondylosis, but states it has not touched the pain in her hip/groin area. Recent imaging did not demonstrate a fracture. Reports her MD wants her to work on her balance and her hip is feeling better but she does still have some pain, but major concern is her balance. Reports she hasn't been doing that much activity since Feb of last year due to fatigue and heart issues.   PERTINENT  HISTORY: Osteopenia, depression, atrial flutter, chronic right low back pain - sees chiro regularly.   PAIN:  Are you having pain? Yes VAS scale: 2/10 Pain location: hip Pain orientation: Right  PAIN TYPE: aching Pain description: intermittent  Aggravating factors: stairs, activity Relieving factors: rest  PRECAUTIONS: None  WEIGHT BEARING RESTRICTIONS No  FALLS:  Has patient fallen in last 6 months? Yes, Number of falls: 1  LIVING ENVIRONMENT: Lives in: House/apartment Stairs: Yes; External: 2 steps; on right going up Has following equipment at home: Single point cane and Walker - 2 wheeled  OCCUPATION: retired  PLOF: Independent with basic ADLs  PATIENT GOALS to work on balance   OBJECTIVE:   DIAGNOSTIC FINDINGS:  Xray 04/10/21:  IMPRESSION: 1. No acute osseous findings or evidence of femoral head avascular necrosis. Mild degenerative changes of both hips. 2. Asymmetric common hamstring tendinosis and partial tearing on the right. Mild gluteus tendinosis and greater trochanteric bursal fluid bilaterally. 3. Mild lower lumbar spondylosis.    MRI 04/10/21 IMPRESSION: 1. No acute osseous findings or evidence of femoral head avascular necrosis. Mild degenerative changes of both hips. 2. Asymmetric common hamstring tendinosis and partial tearing on the right. Mild gluteus tendinosis and greater trochanteric bursal fluid bilaterally. 3. Mild lower lumbar spondylosis.    Cardiovascular Vitals:  BP 158/88 start of session-sitting BP 158/88 end of session-sitting**  COGNITION:  Overall cognitive status: Within functional limits for tasks assessed      MUSCLE LENGTH: Hamstrings: right hip more restricted than left   POSTURE:  Left trunk shift, right hip/ilium higher than left   PALPATION: To palpation along right glutes/ hip musculature  LE AROM/PROM:  A/PROM Right 05/03/2021 Left 05/03/2021  Hip flexion [facility]  Hip extension    Hip abduction     Hip adduction    Hip internal rotation    Hip external rotation    Knee flexion    Knee extension    Ankle dorsiflexion    Ankle plantarflexion    Ankle inversion    Ankle eversion     (Blank rows = not tested)  LE MMT:  MMT Right 05/03/2021 Left 05/03/2021  Hip flexion 3+ 4-  Hip extension    Hip abduction 4-   Hip adduction    Hip internal rotation    Hip external rotation    Knee flexion    Knee extension    Ankle dorsiflexion    Ankle plantarflexion    Ankle inversion    Ankle eversion     (Blank rows = not tested)  LOWER EXTREMITY SPECIAL TESTS:  Hip special tests: Hip scouring test: positive R     FUNCTIONAL TESTS:   Stairs: able to ascend and descend steps without use of hands and with good balance   05/03/21 0001  Balance  Balance Assessed Yes  Standardized Balance Assessment  Standardized Balance Assessment Berg Balance Test  Berg Balance Test  Sit to Stand 4  Standing Unsupported 4  Sitting with Back Unsupported but Feet Supported on Floor or Stool 4  Stand to Sit 4  Transfers 4  Standing Unsupported with Eyes Closed 4  Standing Unsupported with Feet Together 4  From Standing, Reach Forward with Outstretched Arm 4  From Standing Position, Pick up Object from Floor 4  From Standing Position, Turn to Look Behind Over each Shoulder 4  Turn 360 Degrees 4  Standing Unsupported, Alternately Place Feet on Step/Stool 4  Standing Unsupported, One Foot in Front 4  Standing on One Leg 3  Total Score 55/56    GAIT: Distance walked: 75 feet Assistive device utilized: None Level of assistance: Modified independence Comments: left trunk shift, right hip/ilium higher, reduced left arm swing and left trunk rotation, reduced arm swing    TODAY'S TREATMENT: 05/03/20 Therapeutic Exercise:  Aerobic: Supine: R piriformis stretch x3 30" holds hook lying, bent knee fall outs x10 5" holds; cross body stretch for piriformis x3 30" holds R    Seated:  Standing: Neuromuscular Re-education: Manual Therapy: long axis traction of right hip joint - tolerated well  Therapeutic Activity: Self Care: Trigger Point Dry Needling:  Modalities:    PATIENT EDUCATION:  PATIENT EDUCATION:  Education details: on current presentation, on HEP, on clinical outcomes score and POC Person educated: Patient Education method: Consulting civil engineer, Media planner, and Handouts Education comprehension: verbalized understanding   HOME EXERCISE PROGRAM: [id number]  ASSESSMENT:  CLINICAL IMPRESSION: Patient is a 81 y.o. female who was seen today for physical therapy evaluation and treatment for right hip pain. Objective impairments include decreased balance, decreased cognition, decreased mobility, difficulty walking, decreased ROM, decreased strength, and pain. These impairments are limiting patient from community activity and stairs . Personal factors including Age and Fitness are also affecting patient's functional outcome. Patient will benefit from skilled PT to address above impairments and improve overall function.  Patient presents to therapy with complaints of right hip pain after pulling a muscle with scooting in bed and then subsequently missing a chair when transitioning from sit to stand. Today's session demonstrated reduced strength and mobility consistent with deconditioning and recent injury. Patient would benefit from skilled PT to work on improving overall function and perceived balance.  REHAB POTENTIAL: Good  CLINICAL DECISION MAKING: Stable/uncomplicated  EVALUATION COMPLEXITY: Low   GOALS: Goals reviewed with patient? Yes  SHORT TERM GOALS:  STG Name Target Date Goal status  1 Patient will be independent in self management strategies to improve quality of life and functional outcomes. Baseline:  05/31/21 INITIAL  2 Patient will report at least 50% improvement in overall symptoms and/or function to demonstrate improved functional  mobility Baseline: 0% 05/31/21 INITIAL  3  Baseline:  INITIAL  4  Baseline:    5  Baseline:    6  Baseline:    7  Baseline:     LONG TERM GOALS:   LTG Name Target Date Goal status  1 Patient will report at least 75% improvement in overall symptoms and/or function to demonstrate improved functional mobility Baseline:0% 06/28/21 INITIAL  2 Patient will report walking daily to improve overall function and endurance. Baseline:not currently performing walking program 06/28/21 INITIAL  3 Patient will be able to stand on either leg for 10 seconds to demonstrate improved static balance Baseline: < 10 seconds 06/28/21 INITIAL  4  Baseline:    5  Baseline:    6  Baseline:    7  Baseline:       PLAN: PT FREQUENCY:  2x/week  PT DURATION: 8 weeks  PLANNED INTERVENTIONS: Therapeutic exercises, Therapeutic activity, Neuro Muscular re-education, Balance training, Gait training, Patient/Family education, Joint mobilization, Aquatic Therapy, Dry Needling, Electrical stimulation, Spinal mobilization, Cryotherapy, Moist heat, Traction, Ultrasound, and Manual therapy  PLAN FOR NEXT SESSION: hip strengthening, hip/general mobility, balance training as indicated   Lyndee Hensen, PT, DPT 11:52 AM  05/03/21

## 2021-05-03 NOTE — Telephone Encounter (Signed)
Pt was returning a call to speak to someone regarding some changes Dr [name] wants to make.

## 2021-05-03 NOTE — Telephone Encounter (Signed)
Spoke with patient regarding labs, gave a verbal understanding

## 2021-05-04 ENCOUNTER — Encounter: Payer: Self-pay | Admitting: Family Medicine

## 2021-05-05 ENCOUNTER — Ambulatory Visit
Admission: RE | Admit: 2021-05-05 | Discharge: 2021-05-05 | Disposition: A | Discharge status: Home / Self Care | Payer: Medicare Other | Source: Ambulatory Visit | Attending: Family Medicine | Admitting: Family Medicine

## 2021-05-05 DIAGNOSIS — Z1231 Encounter for screening mammogram for malignant neoplasm of breast: Secondary | ICD-10-CM

## 2021-05-05 IMAGING — MG MM DIGITAL SCREENING BILAT W/ TOMO AND CAD
6 of 12 series · 6 of 36 positions shown · non-contrast
Comparison: Previous exam(s).

CLINICAL DATA: Screening.

EXAM:
DIGITAL SCREENING BILATERAL MAMMOGRAM WITH TOMOSYNTHESIS AND CAD
TECHNIQUE: Bilateral screening digital craniocaudal and mediolateral oblique
mammograms were obtained. Bilateral screening digital breast
tomosynthesis was performed. The images were evaluated with
computer-aided detection.

[L MLO synth-2D (1 of 2)]
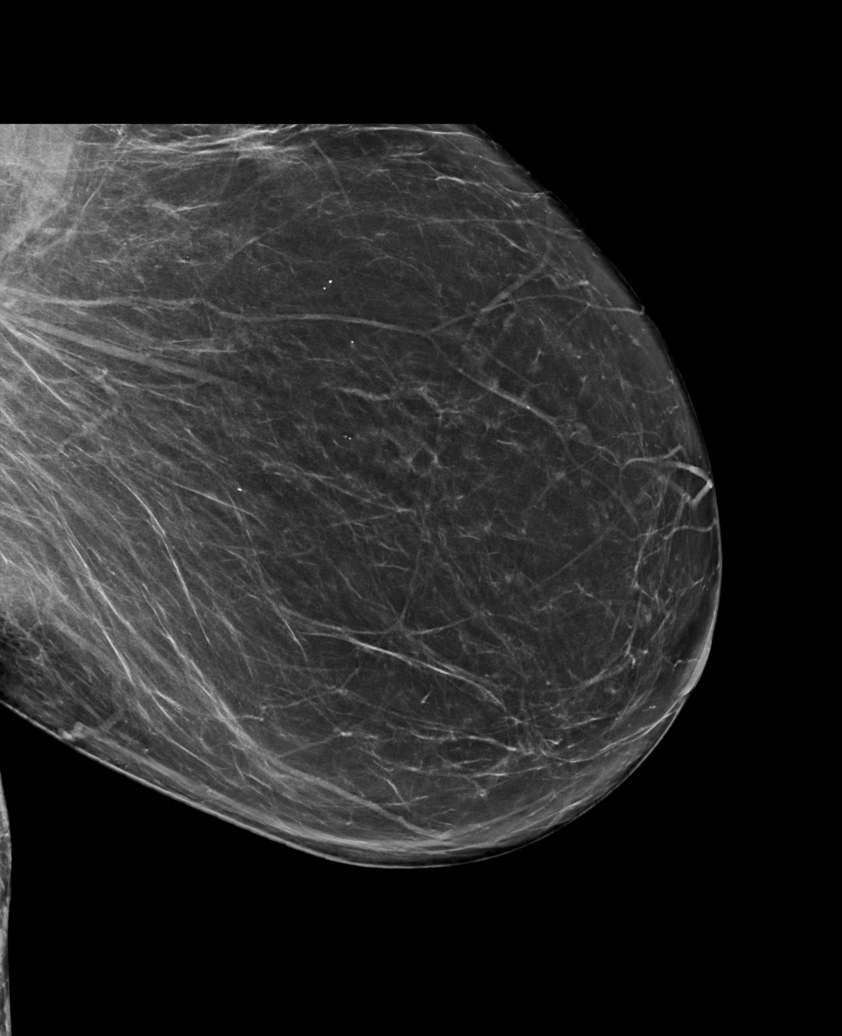

[R MLO synth-2D (1 of 2)]
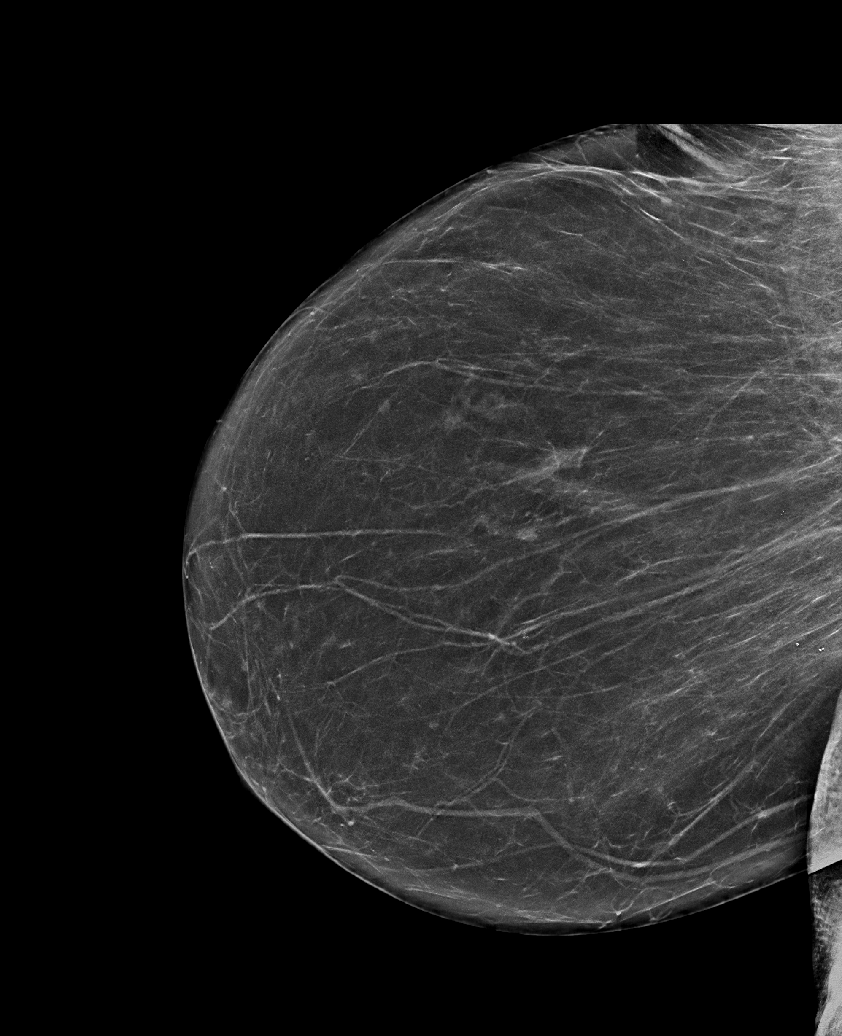

[R CC synth-2D]
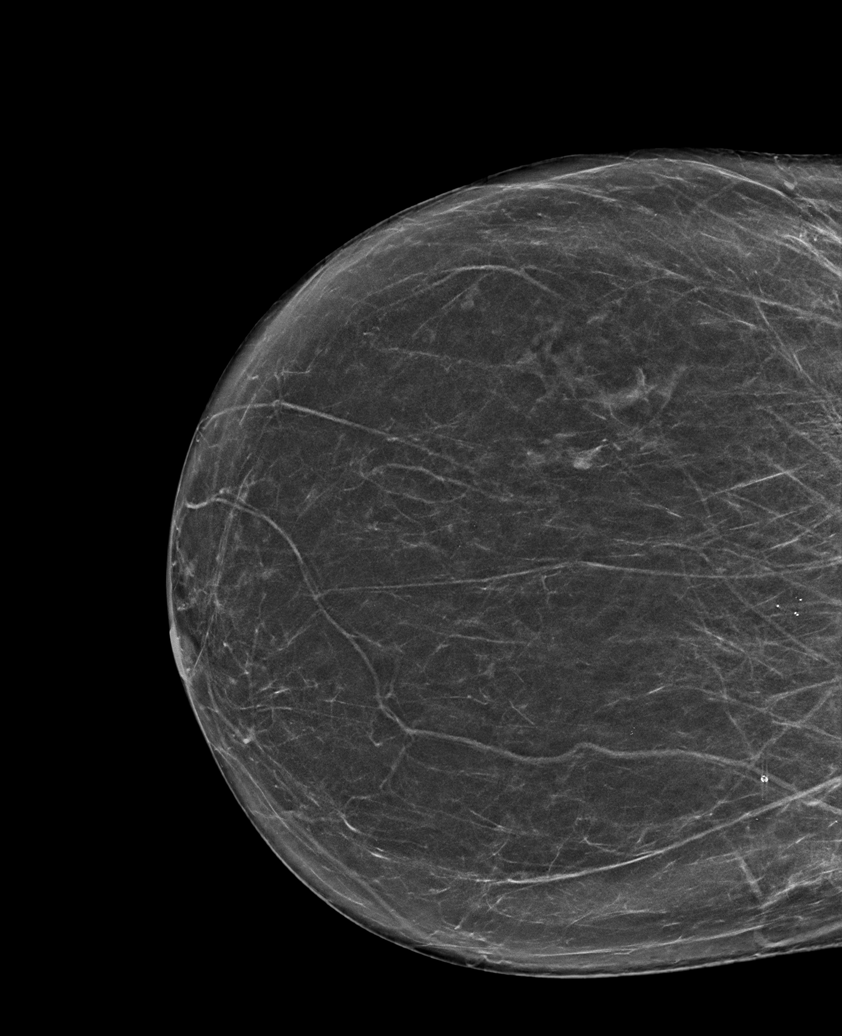

[R MLO synth-2D (2 of 2)]
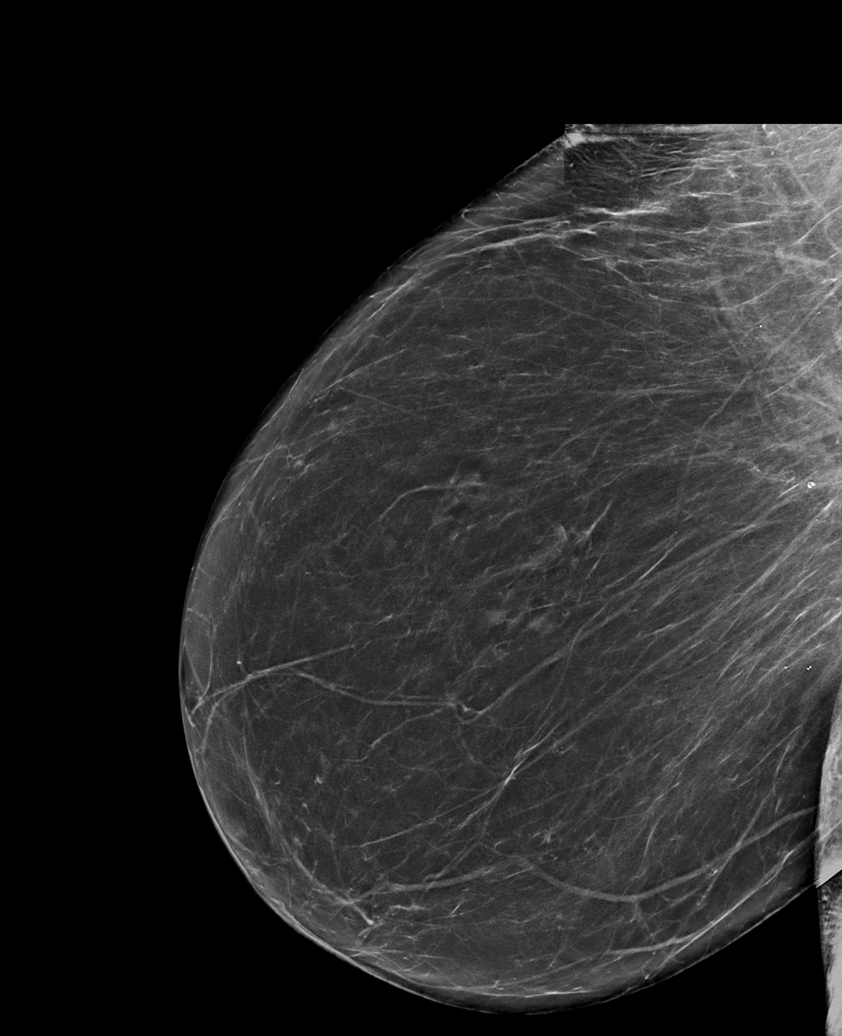

[L MLO synth-2D (2 of 2)]
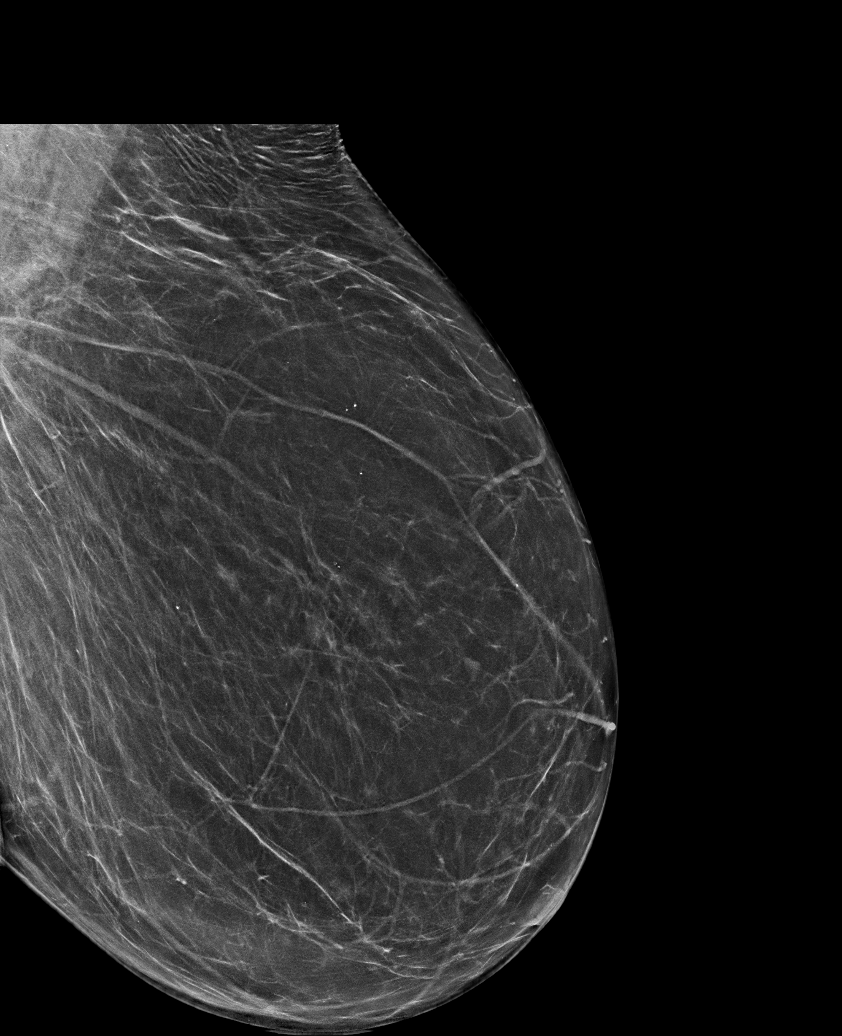

[L CC synth-2D]
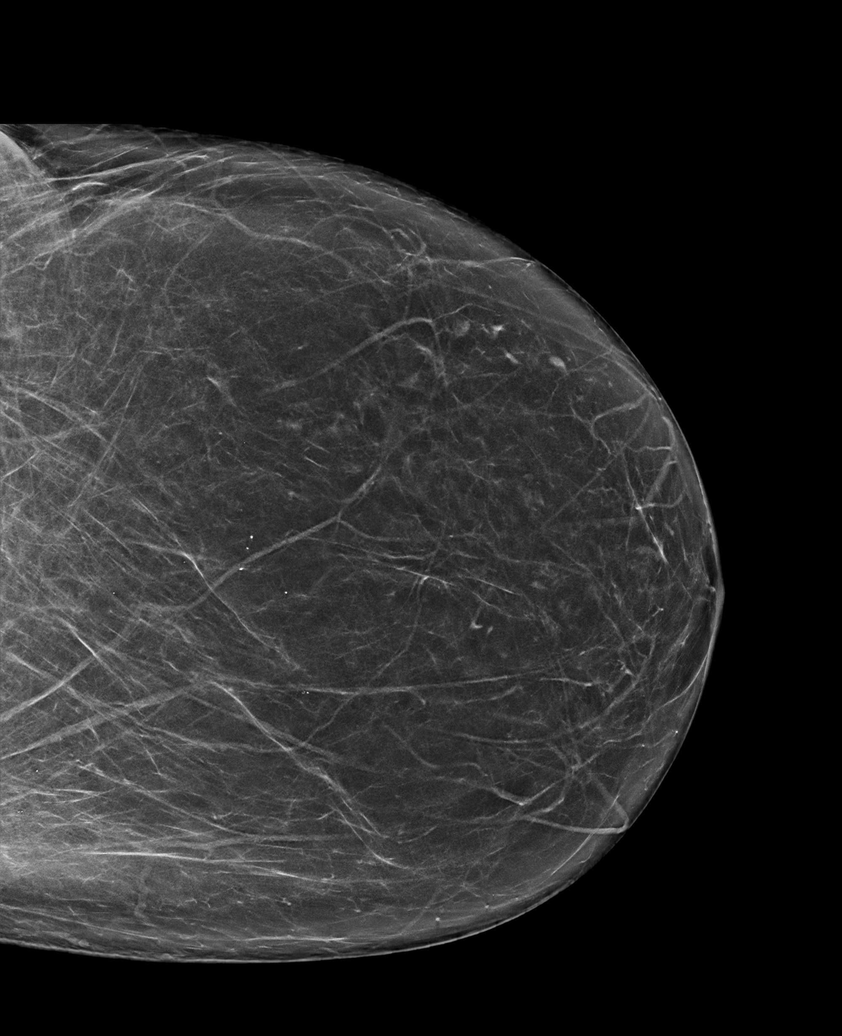

[6 of 36 positions shown; findings below may reference images not displayed]

ACR Breast Density Category b: There are scattered areas of
fibroglandular density.
FINDINGS: There are no findings suspicious for malignancy.
IMPRESSION: No mammographic evidence of malignancy. A result letter of this
screening mammogram will be mailed directly to the patient.

RECOMMENDATION:
Screening mammogram in one year. (Code:[BY])

BI-RADS CATEGORY  1: Negative.

## 2021-05-06 ENCOUNTER — Ambulatory Visit (HOSPITAL_BASED_OUTPATIENT_CLINIC_OR_DEPARTMENT_OTHER): Payer: Medicare Other | Attending: Cardiology | Admitting: Cardiology

## 2021-05-06 ENCOUNTER — Other Ambulatory Visit: Payer: Self-pay

## 2021-05-06 VITALS — Ht 60.0 in | Wt 170.0 lb

## 2021-05-06 DIAGNOSIS — G4733 Obstructive sleep apnea (adult) (pediatric): Secondary | ICD-10-CM

## 2021-05-06 DIAGNOSIS — I4891 Unspecified atrial fibrillation: Secondary | ICD-10-CM | POA: Diagnosis not present

## 2021-05-09 NOTE — Procedures (Signed)
° °  Patient Name: Martha Gilmore, Martha Gilmore Study Date: 05/06/2021 Gender: Female D.O.B: 01-18-1941 Age (years): 63 Referring Provider: Fransico Him MD, ABSM Height (inches): 60 Interpreting Physician: Fransico Him MD, ABSM Weight (lbs): 170 RPSGT: Earney [place] BMI: 33 MRN: [id number] Neck Size: 15.00  CLINICAL INFORMATION The patient is referred for a CPAP titration to treat sleep apnea.  SLEEP STUDY TECHNIQUE As per the AASM Manual for the Scoring of Sleep and Associated Events v2.3 (April 2016) with a hypopnea requiring 4% desaturations.  The channels recorded and monitored were frontal, central and occipital EEG, electrooculogram (EOG), submentalis EMG (chin), nasal and oral airflow, thoracic and abdominal wall motion, anterior tibialis EMG, snore microphone, electrocardiogram, and pulse oximetry. Continuous positive airway pressure (CPAP) was initiated at the beginning of the study and titrated to treat sleep-disordered breathing.  MEDICATIONS Medications self-administered by patient taken the night of the study : Eliquis, ATORVASTATIN, Osybutymin, preservision, OXYBUTYNIN CHLORIDE  TECHNICIAN COMMENTS Comments added by technician: Patient had difficulty initiating sleep. Comments added by scorer: N/A RESPIRATORY PARAMETERS Optimal PAP Pressure (cm): 14  AHI at Optimal Pressure (/hr):0.8 Overall Minimal O2 (%):88.0  Supine % at Optimal Pressure (%):100 Minimal O2 at Optimal Pressure (%): 92.0   SLEEP ARCHITECTURE The study was initiated at 9:47:22 PM and ended at 4:35:05 AM.  Sleep onset time was 52.4 minutes and the sleep efficiency was 72.8%. The total sleep time was 296.8 minutes.  The patient spent 2.4% of the night in stage N1 sleep, 77.6% in stage N2 sleep, 0.0% in stage N3 and 20.1% in REM.Stage REM latency was 246.5 minutes  Wake after sleep onset was 58.5. Alpha intrusion was absent. Supine sleep was 87.70%.  CARDIAC DATA The 2 lead EKG demonstrated atrial  fibrillation The mean heart rate was 111.5 beats per minute.     LEG MOVEMENT DATA The total Periodic Limb Movements of Sleep (PLMS) were 0. The PLMS index was 0.0. A PLMS index of <15 is considered normal in adults.  IMPRESSIONS - The optimal PAP pressure was 14 cm of water. - Central sleep apnea was not noted during this titration (CAI = 0.4/h). - Mild oxygen desaturations were observed during this titration (min O2 = 88.0%). - The patient snored with soft snoring volume during this titration study. - Atrial fibrillation was observed during this study. - Clinically significant periodic limb movements were not noted during this study. Arousals associated with PLMs were rare.  DIAGNOSIS - Obstructive Sleep Apnea (G47.33) - Atrial Fibrillation  RECOMMENDATIONS - Trial of CPAP therapy on 14 cm H2O with a Small size Resmed Full Face Mask Quattro FX for Her mask and heated humidification. - Avoid alcohol, sedatives and other CNS depressants that may worsen sleep apnea and disrupt normal sleep architecture. - Sleep hygiene should be reviewed to assess factors that may improve sleep quality. - Weight management and regular exercise should be initiated or continued. - Return to [facility] Center for re-evaluation after  6 weeks of therapy  [Electronically signed] 05/09/2021 10:37 AM  Fransico Him MD, ABSM Diplomate, American Board of Sleep Medicine

## 2021-05-10 ENCOUNTER — Encounter: Payer: Medicare Other | Admitting: Physical Therapy

## 2021-05-10 ENCOUNTER — Institutional Professional Consult (permissible substitution): Payer: Medicare Other | Admitting: Cardiology

## 2021-05-10 ENCOUNTER — Encounter: Payer: Self-pay | Admitting: Cardiology

## 2021-05-10 ENCOUNTER — Telehealth: Payer: Self-pay | Admitting: Cardiology

## 2021-05-10 NOTE — Telephone Encounter (Signed)
Patient is requesting to schedule an ablation with Dr. Curt Bears. Please assist.

## 2021-05-12 ENCOUNTER — Encounter: Payer: Medicare Other | Admitting: Physical Therapy

## 2021-05-12 ENCOUNTER — Encounter: Payer: Self-pay | Admitting: Family Medicine

## 2021-05-12 ENCOUNTER — Other Ambulatory Visit: Payer: Self-pay

## 2021-05-12 DIAGNOSIS — N3281 Overactive bladder: Secondary | ICD-10-CM

## 2021-05-12 MED ORDER — OXYBUTYNIN CHLORIDE ER 10 MG PO TB24: 10.0000 mg | ORAL_TABLET | Freq: Every day | ORAL | 0 refills | Status: DC

## 2021-05-13 ENCOUNTER — Encounter: Payer: Self-pay | Admitting: Family Medicine

## 2021-05-13 NOTE — Telephone Encounter (Signed)
Patient calling back for update she hasnt heard from our office. Please advise

## 2021-05-13 NOTE — Telephone Encounter (Signed)
Pt aware I will call her at a later date to scheduled this procedure. Patient verbalized understanding and agreeable to plan.

## 2021-05-16 ENCOUNTER — Other Ambulatory Visit: Payer: Medicare Other

## 2021-05-17 ENCOUNTER — Encounter: Payer: Self-pay | Admitting: Physical Therapy

## 2021-05-17 ENCOUNTER — Other Ambulatory Visit: Payer: Self-pay

## 2021-05-17 ENCOUNTER — Ambulatory Visit: Payer: Medicare Other | Admitting: Physical Therapy

## 2021-05-17 DIAGNOSIS — M6281 Muscle weakness (generalized): Secondary | ICD-10-CM | POA: Diagnosis not present

## 2021-05-17 DIAGNOSIS — M25551 Pain in right hip: Secondary | ICD-10-CM

## 2021-05-17 DIAGNOSIS — R262 Difficulty in walking, not elsewhere classified: Secondary | ICD-10-CM | POA: Diagnosis not present

## 2021-05-17 NOTE — Therapy (Signed)
OUTPATIENT PHYSICAL THERAPY LOWER EXTREMITY EVALUATION   Patient Name: Patina San Marino MRN: [id number] DOB:October 27, 1940, 81 y.o., female Today's Date: 05/17/2021   PT End of Session - 05/17/21 2136     Visit Number 2    Number of Visits 16    Authorization Type [employer] $20 co pay    Progress Note Due on Visit 10    PT Start Time 1020    PT Stop Time 1100    PT Time Calculation (min) 40 min    Equipment Utilized During Treatment Gait belt    Activity Tolerance Patient tolerated treatment well              Past Medical History:  Diagnosis Date   Aortic insufficiency    mild to moderate by echo 12/2020   Atrial flutter ([place]) 12/30/2020   Cataracts, bilateral 09/23/2015   Clostridium difficile infection 04/16/2017   H/o C.diff due to clindamicyin 09/2016; two rounds of treatment for cure. Dr. 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 ([place]) 05/02/2021   Nonrheumatic aortic valve insufficiency 04/07/2021   Obstructive sleep apnea 03/28/2021   Aortic insufficiency 01/18/2021   Atrial flutter ([place]) 12/30/2020   Positive ANA (antinuclear antibody) 10/26/2020   Chondromalacia, knee, right 11/05/2017   Osteoarthrosis, localized, primary, knee, right 11/05/2017   Major depression, recurrent, chronic ([place]) 04/16/2017   Mixed  hyperlipidemia 04/16/2017   Clostridium difficile infection 04/16/2017   Family history of early CAD 04/16/2017   Bilateral sensorineural hearing loss 02/06/2016   Postablative hypothyroidism 12/20/2015   History of Graves' disease 03/22/2015   OAB (overactive bladder) 12/16/2013   GERD (gastroesophageal reflux disease) 12/13/2012   Osteoarthritis of CMC joint of thumb 08/13/2012   Seasonal allergies 08/13/2012   Urge and stress incontinence 08/13/2012   Osteopenia 08/05/2012   DDD (degenerative disc disease), lumbosacral 10/22/2010    PCP: Leamon Arnt, MD  REFERRING PROVIDER: [name], MD   REFERRING DIAG: [phone] (ICD-10-CM) - Right hip pain  THERAPY DIAG:  Right hip pain  Muscle weakness (generalized)  Difficulty in walking, not elsewhere classified  ONSET DATE: 03/04/21  SUBJECTIVE:   SUBJECTIVE STATEMENT Pt states minimal/no pain in hip, is doing much better. She does feel limited with mobility and balance.   PERTINENT HISTORY: Osteopenia, depression, atrial flutter, chronic right low back pain - sees chiro regularly.   PAIN:  Are you having pain? Yes VAS scale: 2/10 Pain location: hip Pain orientation: Right  PAIN TYPE: aching Pain description: intermittent  Aggravating factors: stairs, activity Relieving factors: rest  PRECAUTIONS: None  WEIGHT BEARING RESTRICTIONS No  FALLS:  Has patient fallen in last 6 months? Yes, Number of falls: 1  LIVING ENVIRONMENT: Lives in: House/apartment Stairs: Yes; External: 2 steps; on right going up Has following  equipment at home: Single point cane and Walker - 2 wheeled  OCCUPATION: retired  PLOF: Independent with basic ADLs  PATIENT GOALS to work on balance   OBJECTIVE:   DIAGNOSTIC FINDINGS:  Xray 04/10/21:  IMPRESSION: 1. No acute osseous findings or evidence of femoral head avascular necrosis. Mild degenerative changes of both hips. 2. Asymmetric common hamstring tendinosis and partial  tearing on the right. Mild gluteus tendinosis and greater trochanteric bursal fluid bilaterally. 3. Mild lower lumbar spondylosis.    MRI 04/10/21 IMPRESSION: 1. No acute osseous findings or evidence of femoral head avascular necrosis. Mild degenerative changes of both hips. 2. Asymmetric common hamstring tendinosis and partial tearing on the right. Mild gluteus tendinosis and greater trochanteric bursal fluid bilaterally. 3. Mild lower lumbar spondylosis.   COGNITION:  Overall cognitive status: Within functional limits for tasks assessed    LE MMT:  MMT Right 05/03/21 Left 05/03/21  Hip flexion 3+ 4-  Hip extension    Hip abduction 4- 4-  Hip adduction    Hip internal rotation    Hip external rotation    Knee flexion    Knee extension    Ankle dorsiflexion    Ankle plantarflexion    Ankle inversion    Ankle eversion     (Blank rows = not tested)    FUNCTIONAL TESTS:  BERG 05/03/21:  55/56    GAIT: Assistive device utilized: None Level of assistance: Modified independence Comments: left trunk shift, right hip/ilium higher, reduced left arm swing and left trunk rotation, reduced arm swing    TODAY'S TREATMENT: 05/17/20 Therapeutic Exercise: Aerobic: Supine: R piriformis stretch x3 30" holds hook lying, bent knee fall outs x10 5" holds;  LTR 10 sec x 10;  SLR 2x10 bil;  Bridging x 20;  Seated: sit to stand x 10  S/L: hip abd x 15 bil;  Standing: Ambulation 35 ft x 4 with education on wider step width;  Neuromuscular Re-education: fwd/bwd stepping with weight shifts x 20 each; Toe taps on 6 in step x 20; Step ups on 6 in step x 10 bil, 1 HR;  Manual Therapy: long axis traction of right hip joint  Self Care:    05/03/20 Therapeutic Exercise:  Aerobic: Supine: R piriformis stretch x3 30" holds hook lying, bent knee fall outs x10 5" holds; cross body stretch for piriformis x3 30" holds R   Seated:  Standing: Neuromuscular Re-education: Manual Therapy: long  axis traction of right hip joint - tolerated well  Therapeutic Activity: Self Care: Trigger Point Dry Needling:  Modalities:    PATIENT EDUCATION:  PATIENT EDUCATION:  Education details: updated and reviewed HEP Person educated: Patient Education method: Explanation, Demonstration, and Handouts Education comprehension: verbalized understanding   HOME EXERCISE PROGRAM: [id number]  ASSESSMENT:  CLINICAL IMPRESSION: 05/17/21: Pt with no pain in hip with ther ex today. Progressed to light strengthening for hips, added to HEP.  Decreased stability noted with balance exercises today, pt with tendency for very narrow base with walking, and occasional cross over. Asymmetry in back also effecting balance and gait mechanics. Pt to benefit form continued work on balance, and progression of hip and LE strength as tolerated.   Patient is a 81 y.o. female who was seen today for physical therapy evaluation and treatment for right hip pain. Objective impairments include decreased balance, decreased cognition, decreased mobility, difficulty walking, decreased ROM, decreased strength, and pain. These impairments are limiting patient from community activity and stairs . Personal factors including Age and  Fitness are also affecting patient's functional outcome. Patient will benefit from skilled PT to address above impairments and improve overall function.   REHAB POTENTIAL: Good  CLINICAL DECISION MAKING: Stable/uncomplicated  EVALUATION COMPLEXITY: Low   GOALS: Goals reviewed with patient? Yes  SHORT TERM GOALS:  STG Name Target Date Goal status  1 Patient will be independent in self management strategies to improve quality of life and functional outcomes. Baseline:  05/31/21 INITIAL  2 Patient will report at least 50% improvement in overall symptoms and/or function to demonstrate improved functional mobility Baseline: 0% 05/31/21 INITIAL  3  Baseline:  INITIAL  4  Baseline:    5  Baseline:     6  Baseline:    7  Baseline:     LONG TERM GOALS:   LTG Name Target Date Goal status  1 Patient will report at least 75% improvement in overall symptoms and/or function to demonstrate improved functional mobility Baseline:0% 06/28/21 INITIAL  2 Patient will report walking daily to improve overall function and endurance. Baseline:not currently performing walking program 06/28/21 INITIAL  3 Patient will be able to stand on either leg for 10 seconds to demonstrate improved static balance Baseline: < 10 seconds 06/28/21 INITIAL  4  Baseline:    5  Baseline:    6  Baseline:    7  Baseline:       PLAN: PT FREQUENCY: 2x/week  PT DURATION: 8 weeks  PLANNED INTERVENTIONS: Therapeutic exercises, Therapeutic activity, Neuro Muscular re-education, Balance training, Gait training, Patient/Family education, Joint mobilization, Aquatic Therapy, Dry Needling, Electrical stimulation, Spinal mobilization, Cryotherapy, Moist heat, Traction, Ultrasound, and Manual therapy  PLAN FOR NEXT SESSION: hip strengthening, hip/general mobility, balance training as indicated   Lyndee Hensen, PT, DPT 9:43 PM  05/17/21

## 2021-05-19 ENCOUNTER — Encounter: Payer: Self-pay | Admitting: Physical Therapy

## 2021-05-19 ENCOUNTER — Encounter: Payer: Self-pay | Admitting: Family Medicine

## 2021-05-19 ENCOUNTER — Ambulatory Visit: Payer: Medicare Other | Admitting: Physical Therapy

## 2021-05-19 ENCOUNTER — Encounter: Payer: Self-pay | Admitting: Cardiology

## 2021-05-19 ENCOUNTER — Other Ambulatory Visit: Payer: Self-pay

## 2021-05-19 DIAGNOSIS — M25551 Pain in right hip: Secondary | ICD-10-CM | POA: Diagnosis not present

## 2021-05-19 DIAGNOSIS — M6281 Muscle weakness (generalized): Secondary | ICD-10-CM

## 2021-05-19 DIAGNOSIS — R262 Difficulty in walking, not elsewhere classified: Secondary | ICD-10-CM | POA: Diagnosis not present

## 2021-05-19 NOTE — Therapy (Signed)
OUTPATIENT PHYSICAL THERAPY TREATMENT    Patient Name: Martha Gilmore MRN: [id number] DOB:10-18-1940, 81 y.o., female Today's Date: 05/19/2021   PT End of Session - 05/19/21 1029     Visit Number 3    Number of Visits 16    Authorization Type [employer] $20 co pay    Progress Note Due on Visit 10    PT Start Time 1020    PT Stop Time 1104    PT Time Calculation (min) 44 min    Equipment Utilized During Treatment Gait belt    Activity Tolerance Patient tolerated treatment well               Past Medical History:  Diagnosis Date   Aortic insufficiency    mild to moderate by echo 12/2020   Atrial flutter ([place]) 12/30/2020   Cataracts, bilateral 09/23/2015   Clostridium difficile infection 04/16/2017   H/o C.diff due to clindamicyin 09/2016; two rounds of treatment for cure. Dr. 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 ([place]) 05/02/2021   Nonrheumatic aortic valve insufficiency 04/07/2021   Obstructive sleep apnea 03/28/2021   Aortic insufficiency 01/18/2021   Atrial flutter ([place]) 12/30/2020   Positive ANA (antinuclear antibody) 10/26/2020   Chondromalacia, knee, right 11/05/2017   Osteoarthrosis, localized, primary, knee, right 11/05/2017   Major depression, recurrent, chronic ([place]) 04/16/2017   Mixed hyperlipidemia  04/16/2017   Clostridium difficile infection 04/16/2017   Family history of early CAD 04/16/2017   Bilateral sensorineural hearing loss 02/06/2016   Postablative hypothyroidism 12/20/2015   History of Graves' disease 03/22/2015   OAB (overactive bladder) 12/16/2013   GERD (gastroesophageal reflux disease) 12/13/2012   Osteoarthritis of CMC joint of thumb 08/13/2012   Seasonal allergies 08/13/2012   Urge and stress incontinence 08/13/2012   Osteopenia 08/05/2012   DDD (degenerative disc disease), lumbosacral 10/22/2010    PCP: Leamon Arnt, MD  REFERRING PROVIDER: [name], MD   REFERRING DIAG: [phone] (ICD-10-CM) - Right hip pain  THERAPY DIAG:  Right hip pain  Muscle weakness (generalized)  Difficulty in walking, not elsewhere classified  ONSET DATE: 03/04/21  SUBJECTIVE:   SUBJECTIVE STATEMENT Pt states no pain in hip, R low back/SI is always a bit sore. Did see chiropractor yesterday.   PERTINENT HISTORY: Osteopenia, depression, atrial flutter, chronic right low back pain - sees chiro regularly.   PAIN:  Are you having pain? Yes VAS scale: 2/10 Pain location: hip Pain orientation: Right  PAIN TYPE: aching Pain description: intermittent  Aggravating factors: stairs, activity Relieving factors: rest  PRECAUTIONS: None  WEIGHT BEARING RESTRICTIONS No  FALLS:  Has patient fallen in last 6 months? Yes, Number of falls: 1  LIVING ENVIRONMENT: Lives in: House/apartment Stairs: Yes; External: 2 steps; on right going up Has following  equipment at home: Single point cane and Walker - 2 wheeled  OCCUPATION: retired  PLOF: Independent with basic ADLs  PATIENT GOALS to work on balance   OBJECTIVE:    COGNITION:  Overall cognitive status: Within functional limits for tasks assessed    LE MMT:  MMT Right 05/03/21 Left 05/03/21  Hip flexion 3+ 4-  Hip extension    Hip abduction 4- 4-  Hip adduction    Hip internal rotation    Hip external  rotation    Knee flexion    Knee extension    Ankle dorsiflexion    Ankle plantarflexion    Ankle inversion    Ankle eversion     (Blank rows = not tested)    FUNCTIONAL TESTS:  BERG 05/03/21:  55/56    GAIT: Assistive device utilized: None Level of assistance: Modified independence Comments: left trunk shift, right hip/ilium higher, reduced left arm swing and left trunk rotation, reduced arm swing    TODAY'S TREATMENT:  05/19/20 Therapeutic Exercise: Aerobic: Supine:  LTR 10 sec x 10;  SKTC 30 sec x 3 on R;  Bridging x 20;  Seated:  sit to stand x 10  S/L: hip abd 2 x 10 bil;  QL stretch 20 sec x 3 on R;  Standing:  hip abd 2x10 bil; Marching x 20 Neuromuscular Re-education: Tandem stance 30 sec x3 bil;  SLS 10 sec x 2 bil;  Manual Therapy: long axis traction / right hip and lumbar  Self Care:    05/17/20 Therapeutic Exercise: Aerobic: Supine: R piriformis stretch x3 30" holds hook lying, bent knee fall outs x10 5" holds;  LTR 10 sec x 10;  SLR 2x10 bil;  Bridging x 20;  Seated: sit to stand x 10  S/L: hip abd x 15 bil;  Standing: Ambulation 35 ft x 4 with education on wider step width;  Neuromuscular Re-education: fwd/bwd stepping with weight shifts x 20 each; Toe taps on 6 in step x 20; Step ups on 6 in step x 10 bil, 1 HR;  Manual Therapy: long axis traction of right hip joint  Self Care:    05/03/20 Therapeutic Exercise:  Aerobic: Supine: R piriformis stretch x3 30" holds hook lying, bent knee fall outs x10 5" holds; cross body stretch for piriformis x3 30" holds R   Seated:  Standing: Neuromuscular Re-education: Manual Therapy: long axis traction of right hip joint - tolerated well  Therapeutic Activity: Self Care: Trigger Point Dry Needling:  Modalities:    PATIENT EDUCATION:  PATIENT EDUCATION:  Education details: reviewed HEP Person educated: Patient Education method: Explanation, Media planner, and Handouts Education comprehension: verbalized  understanding   HOME EXERCISE PROGRAM: [id number]  ASSESSMENT:  CLINICAL IMPRESSION: 05/19/21: Pt with no pain in hip. Does have pain in R low lumbar region, likely from weakness in hip, and scoliosis , causing increased compression on R side in standing, noted instability in R hip with SLS (and with SLS phase of gait on R). Continued strengthening for R hip, discussed importance of this for HEP. Pt with decreased pain in back after decompression/traction and self stretching today. Plan to continue standing balance and hip strength as tolerated. HR readings (very) variable today, up to 130 with digital reading, irregular with manual palpation. Sent message to cardiologist, pt to f/u with them in near future, she does have a-flutter, and is supposed to be scheduling for a procedure soon. Pt asymptomatic today, discussed other signs /symptoms to watch for and to call  MD as needed.    Objective impairments include decreased balance, decreased cognition, decreased mobility, difficulty walking, decreased ROM, decreased strength, and pain. These impairments are limiting patient from community activity and stairs . Personal factors including Age and Fitness are also affecting patient's functional outcome. Patient will benefit from skilled PT to address above impairments and improve overall function.   REHAB POTENTIAL: Good  CLINICAL DECISION MAKING: Stable/uncomplicated  EVALUATION COMPLEXITY: Low   GOALS: Goals reviewed with patient? Yes  SHORT TERM GOALS:  STG Name Target Date Goal status  1 Patient will be independent in self management strategies to improve quality of life and functional outcomes. Baseline:  05/31/21 INITIAL  2 Patient will report at least 50% improvement in overall symptoms and/or function to demonstrate improved functional mobility Baseline: 0% 05/31/21 INITIAL  3  Baseline:  INITIAL  4  Baseline:    5  Baseline:    6  Baseline:    7  Baseline:     LONG TERM GOALS:    LTG Name Target Date Goal status  1 Patient will report at least 75% improvement in overall symptoms and/or function to demonstrate improved functional mobility Baseline:0% 06/28/21 INITIAL  2 Patient will report walking daily to improve overall function and endurance. Baseline:not currently performing walking program 06/28/21 INITIAL  3 Patient will be able to stand on either leg for 10 seconds to demonstrate improved static balance Baseline: < 10 seconds 06/28/21 INITIAL  4  Baseline:    5  Baseline:    6  Baseline:    7  Baseline:       PLAN: PT FREQUENCY: 2x/week  PT DURATION: 8 weeks  PLANNED INTERVENTIONS: Therapeutic exercises, Therapeutic activity, Neuro Muscular re-education, Balance training, Gait training, Patient/Family education, Joint mobilization, Aquatic Therapy, Dry Needling, Electrical stimulation, Spinal mobilization, Cryotherapy, Moist heat, Traction, Ultrasound, and Manual therapy  PLAN FOR NEXT SESSION: hip strengthening, hip/general mobility, balance training    Lyndee Hensen, PT, DPT 11:18 AM  05/19/21

## 2021-05-20 ENCOUNTER — Other Ambulatory Visit: Payer: Self-pay | Admitting: *Deleted

## 2021-05-20 ENCOUNTER — Other Ambulatory Visit: Payer: Self-pay

## 2021-05-20 DIAGNOSIS — Z713 Dietary counseling and surveillance: Secondary | ICD-10-CM

## 2021-05-20 NOTE — Telephone Encounter (Signed)
Patient called to schedule her ablation.  

## 2021-05-20 NOTE — Telephone Encounter (Signed)
Attempted to return pt call, no answer/no voicemail

## 2021-05-20 NOTE — Telephone Encounter (Signed)
Pt aware, as advised last Friday, that I would be in touch to scheduled.  Explained that we are still working on more time in February/March, waiting for that before calling to scheduled as of right now it is end of April before availability. Pt agreeable to plan.

## 2021-05-24 ENCOUNTER — Ambulatory Visit: Payer: Medicare Other | Admitting: Physical Therapy

## 2021-05-24 ENCOUNTER — Other Ambulatory Visit: Payer: Self-pay

## 2021-05-24 ENCOUNTER — Encounter: Payer: Self-pay | Admitting: Physical Therapy

## 2021-05-24 DIAGNOSIS — R262 Difficulty in walking, not elsewhere classified: Secondary | ICD-10-CM

## 2021-05-24 DIAGNOSIS — M6281 Muscle weakness (generalized): Secondary | ICD-10-CM

## 2021-05-24 DIAGNOSIS — M25551 Pain in right hip: Secondary | ICD-10-CM

## 2021-05-24 NOTE — Therapy (Signed)
OUTPATIENT PHYSICAL THERAPY TREATMENT    Patient Name: Martha Gilmore MRN: [id number] DOB:[birth year], 81 y.o., female Today's Date: 05/24/2021   PT End of Session - 05/24/21 1152     Visit Number 4    Number of Visits 16    Authorization Type [employer] $20 co pay    Progress Note Due on Visit 10    PT Start Time 0845    PT Stop Time 0925    PT Time Calculation (min) 40 min    Equipment Utilized During Treatment Gait belt    Activity Tolerance Patient tolerated treatment well                Past Medical History:  Diagnosis Date   Aortic insufficiency    mild to moderate by echo 12/2020   Atrial flutter ([place]) 12/30/2020   Cataracts, bilateral 09/23/2015   Clostridium difficile infection 04/16/2017   H/o C.diff due to clindamicyin 09/2016; two rounds of treatment for cure. Dr. 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 ([place]) 05/02/2021   Nonrheumatic aortic valve insufficiency 04/07/2021   Obstructive sleep apnea 03/28/2021   Aortic insufficiency 01/18/2021   Atrial flutter ([place]) 12/30/2020   Positive ANA (antinuclear antibody) 10/26/2020   Chondromalacia, knee, right 11/05/2017   Osteoarthrosis, localized, primary, knee, right 11/05/2017   Major depression, recurrent, chronic ([place]) 04/16/2017   Mixed hyperlipidemia  04/16/2017   Clostridium difficile infection 04/16/2017   Family history of early CAD 04/16/2017   Bilateral sensorineural hearing loss 02/06/2016   Postablative hypothyroidism 12/20/2015   History of Graves' disease 03/22/2015   OAB (overactive bladder) 12/16/2013   GERD (gastroesophageal reflux disease) 12/13/2012   Osteoarthritis of CMC joint of thumb 08/13/2012   Seasonal allergies 08/13/2012   Urge and stress incontinence 08/13/2012   Osteopenia 08/05/2012   DDD (degenerative disc disease), lumbosacral 10/22/2010    PCP: Leamon Arnt, MD  REFERRING PROVIDER: [name], MD   REFERRING DIAG: [phone] (ICD-10-CM) - Right hip pain  THERAPY DIAG:  Right hip pain  Muscle weakness (generalized)  Difficulty in walking, not elsewhere classified  ONSET DATE: 03/04/21  SUBJECTIVE:   SUBJECTIVE STATEMENT Pt states improved pain in Low back since last visit. She has been doing HEP. Had slight soreness with SLR.   PERTINENT HISTORY: Osteopenia, depression, atrial flutter, chronic right low back pain - sees chiro regularly.   PAIN:  Are you having pain? Yes VAS scale: 2/10 Pain location: hip Pain orientation: Right  PAIN TYPE: aching Pain description: intermittent  Aggravating factors: stairs, activity Relieving factors: rest  PRECAUTIONS: None  WEIGHT BEARING RESTRICTIONS No  FALLS:  Has patient fallen in last 6 months? Yes, Number of falls: 1  LIVING ENVIRONMENT: Lives in: House/apartment Stairs: Yes; External: 2 steps; on right going  up Has following equipment at home: Single point cane and Walker - 2 wheeled  OCCUPATION: retired  PLOF: Independent with basic ADLs  PATIENT GOALS to work on balance   OBJECTIVE:    COGNITION:  Overall cognitive status: Within functional limits for tasks assessed    LE MMT:  MMT Right 05/03/21 Left 05/03/21  Hip flexion 3+ 4-  Hip extension    Hip abduction 4- 4-  Hip adduction    Hip internal rotation    Hip  external rotation    Knee flexion    Knee extension    Ankle dorsiflexion    Ankle plantarflexion    Ankle inversion    Ankle eversion     (Blank rows = not tested)    FUNCTIONAL TESTS:  BERG 05/03/21:  55/56    GAIT: Assistive device utilized: None Level of assistance: Modified independence Comments: left trunk shift, right hip/ilium higher, reduced left arm swing and left trunk rotation, reduced arm swing    TODAY'S TREATMENT:  05/24/20 Therapeutic Exercise: Aerobic: Supine:  SKTC 30 sec x 3 on R;  Bridging x 20;  Seated:  sit to stand x 15 S/L:  hip abd 2 x 10 bil;   Clams x 15  bil; QL stretch 20 sec x 3 on R;  Standing:   Marching x 20,  Neuromuscular Re-education: Tandem stance 30 sec x3 bil;  Walk/March 15 ft x 4;  side stepping 15 ft x 4; L/R weight shifts x 20;  standing head turns x 20; Gaze/vertical x 20;  Toe taps on 6 in step x 20, no UE support;  Manual Therapy: long axis traction / right hip and lumbar  Self Care:    05/19/20 Therapeutic Exercise: Aerobic: Supine:  LTR 10 sec x 10;  SKTC 30 sec x 3 on R;  Bridging x 20;  Seated:  sit to stand x 10  S/L: hip abd 2 x 10 bil;  QL stretch 20 sec x 3 on R;  Standing:  hip abd 2x10 bil; Marching x 20 Neuromuscular Re-education: Tandem stance 30 sec x3 bil;  SLS 10 sec x 2 bil;  Manual Therapy: long axis traction / right hip and lumbar  Self Care:    05/17/20 Therapeutic Exercise: Aerobic: Supine: R piriformis stretch x3 30" holds hook lying, bent knee fall outs x10 5" holds;  LTR 10 sec x 10;  SLR 2x10 bil;  Bridging x 20;  Seated: sit to stand x 10  S/L: hip abd x 15 bil;  Standing: Ambulation 35 ft x 4 with education on wider step width;  Neuromuscular Re-education: fwd/bwd stepping with weight shifts x 20 each; Toe taps on 6 in step x 20; Step ups on 6 in step x 10 bil, 1 HR;  Manual Therapy: long axis traction of right hip joint  Self Care:     PATIENT EDUCATION:  PATIENT EDUCATION:   Education details: reviewed HEP Person educated: Patient Education method: Explanation, Media planner, and Handouts Education comprehension: verbalized understanding   HOME EXERCISE PROGRAM: [id number]  ASSESSMENT:  CLINICAL IMPRESSION: 05/24/21:    Pt challenged with dynamic balance exercises today. No dizziness elicited with head turns or with gaze.(Tested due to pt reports of feeling "off" at times, when changing directions a lot in kitchen). Pt with tendency for very narrow BOS, feet touching. Educated on improved balance with slightly wider stance. Plan to progress to walking with head turns next visit. Pt to benefit from continued LE strengthening and balance.  Objective impairments include decreased balance, decreased cognition, decreased mobility, difficulty walking, decreased ROM, decreased strength, and pain. These impairments are limiting patient from community activity and stairs . Personal factors including Age and Fitness are also affecting patient's functional outcome. Patient will benefit from skilled PT to address above impairments and improve overall function.   REHAB POTENTIAL: Good  CLINICAL DECISION MAKING: Stable/uncomplicated  EVALUATION COMPLEXITY: Low   GOALS: Goals reviewed with patient? Yes  SHORT TERM GOALS:  STG Name Target Date Goal status  1 Patient will be independent in self management strategies to improve quality of life and functional outcomes. Baseline:  05/31/21 INITIAL  2 Patient will report at least 50% improvement in overall symptoms and/or function to demonstrate improved functional mobility Baseline: 0% 05/31/21 INITIAL  3  Baseline:  INITIAL  4  Baseline:    5  Baseline:    6  Baseline:    7  Baseline:     LONG TERM GOALS:   LTG Name Target Date Goal status  1 Patient will report at least 75% improvement in overall symptoms and/or function to demonstrate improved functional mobility Baseline:0% 06/28/21 INITIAL  2 Patient will  report walking daily to improve overall function and endurance. Baseline:not currently performing walking program 06/28/21 INITIAL  3 Patient will be able to stand on either leg for 10 seconds to demonstrate improved static balance Baseline: < 10 seconds 06/28/21 INITIAL  4  Baseline:    5  Baseline:    6  Baseline:    7  Baseline:       PLAN: PT FREQUENCY: 2x/week  PT DURATION: 8 weeks  PLANNED INTERVENTIONS: Therapeutic exercises, Therapeutic activity, Neuro Muscular re-education, Balance training, Gait training, Patient/Family education, Joint mobilization, Aquatic Therapy, Dry Needling, Electrical stimulation, Spinal mobilization, Cryotherapy, Moist heat, Traction, Ultrasound, and Manual therapy  PLAN FOR NEXT SESSION: hip strengthening, hip/general mobility, balance training    Lyndee Hensen, PT, DPT 11:52 AM  05/24/21

## 2021-05-25 ENCOUNTER — Telehealth: Payer: Self-pay | Admitting: *Deleted

## 2021-05-25 NOTE — Telephone Encounter (Signed)
The patient has been notified of the result and verbalized understanding.  All questions (if any) were answered. Marolyn Hammock, [place] 05/25/2021 4:15 PM    Upon patient request DME selection is [facility] Patient understands he will be contacted by [facility] to set up his cpap. Patient understands to call if [facility] does not contact him with new setup in a timely manner. Patient understands they will be called once confirmation has been received from [facility]/ that they have received their new machine to schedule 10 week follow up appointment.   [facility] notified of new cpap order  Please add to airview Patient was grateful for the call and thanked me

## 2021-05-25 NOTE — Telephone Encounter (Signed)
-----   Message from Sueanne Margarita, MD sent at 05/09/2021 10:41 AM EST ----- Please let patient know that they had a successful PAP titration and let DME know that orders are in EPIC.  Please set up 6 week OV with me.

## 2021-05-26 ENCOUNTER — Encounter: Payer: Self-pay | Admitting: Physical Therapy

## 2021-05-26 ENCOUNTER — Encounter: Payer: Medicare Other | Admitting: Physical Therapy

## 2021-05-26 ENCOUNTER — Ambulatory Visit: Payer: Medicare Other | Admitting: Physical Therapy

## 2021-05-26 ENCOUNTER — Other Ambulatory Visit: Payer: Self-pay

## 2021-05-26 DIAGNOSIS — M6281 Muscle weakness (generalized): Secondary | ICD-10-CM | POA: Diagnosis not present

## 2021-05-26 DIAGNOSIS — R262 Difficulty in walking, not elsewhere classified: Secondary | ICD-10-CM

## 2021-05-26 DIAGNOSIS — M25551 Pain in right hip: Secondary | ICD-10-CM | POA: Diagnosis not present

## 2021-05-26 NOTE — Therapy (Signed)
OUTPATIENT PHYSICAL THERAPY TREATMENT    Patient Name: Pattie San Marino MRN: [id number] DOB:[birth year], 81 y.o., female Today's Date: 05/26/2021   PT End of Session - 05/26/21 0908     Visit Number 5    Number of Visits 16    Authorization Type [employer] $20 co pay    Progress Note Due on Visit 10    PT Start Time 0906    PT Stop Time 0945    PT Time Calculation (min) 39 min    Equipment Utilized During Treatment Gait belt    Activity Tolerance Patient tolerated treatment well                Past Medical History:  Diagnosis Date   Aortic insufficiency    mild to moderate by echo 12/2020   Atrial flutter ([place]) 12/30/2020   Cataracts, bilateral 09/23/2015   Clostridium difficile infection 04/16/2017   H/o C.diff due to clindamicyin 09/2016; two rounds of treatment for cure. Dr. 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 ([place]) 05/02/2021   Nonrheumatic aortic valve insufficiency 04/07/2021   Obstructive sleep apnea 03/28/2021   Aortic insufficiency 01/18/2021   Atrial flutter ([place]) 12/30/2020   Positive ANA (antinuclear antibody) 10/26/2020   Chondromalacia, knee, right 11/05/2017   Osteoarthrosis, localized, primary, knee, right 11/05/2017   Major depression, recurrent, chronic ([place]) 04/16/2017   Mixed hyperlipidemia  04/16/2017   Clostridium difficile infection 04/16/2017   Family history of early CAD 04/16/2017   Bilateral sensorineural hearing loss 02/06/2016   Postablative hypothyroidism 12/20/2015   History of Graves' disease 03/22/2015   OAB (overactive bladder) 12/16/2013   GERD (gastroesophageal reflux disease) 12/13/2012   Osteoarthritis of CMC joint of thumb 08/13/2012   Seasonal allergies 08/13/2012   Urge and stress incontinence 08/13/2012   Osteopenia 08/05/2012   DDD (degenerative disc disease), lumbosacral 10/22/2010    PCP: Leamon Arnt, MD  REFERRING PROVIDER: [name], MD   REFERRING DIAG: [phone] (ICD-10-CM) - Right hip pain  THERAPY DIAG:  Right hip pain  Muscle weakness (generalized)  Difficulty in walking, not elsewhere classified  ONSET DATE: 03/04/21  SUBJECTIVE:   SUBJECTIVE STATEMENT Pt states improved pain in Low back since last visit. She has been doing HEP. Had slight soreness with SLR.   PERTINENT HISTORY: Osteopenia, depression, atrial flutter, chronic right low back pain - sees chiro regularly.   PAIN:  Are you having pain? Yes VAS scale: 2/10 Pain location: hip Pain orientation: Right  PAIN TYPE: aching Pain description: intermittent  Aggravating factors: stairs, activity Relieving factors: rest  PRECAUTIONS: None  WEIGHT BEARING RESTRICTIONS No  FALLS:  Has patient fallen in last 6 months? Yes, Number of falls: 1  LIVING ENVIRONMENT: Lives in: House/apartment Stairs: Yes; External: 2 steps; on right going  up Has following equipment at home: Single point cane and Walker - 2 wheeled  OCCUPATION: retired  PLOF: Independent with basic ADLs  PATIENT GOALS to work on balance   OBJECTIVE:    COGNITION:  Overall cognitive status: Within functional limits for tasks assessed    LE MMT:  MMT Right 05/03/21 Left 05/03/21  Hip flexion 3+ 4-  Hip extension    Hip abduction 4- 4-  Hip adduction    Hip internal rotation    Hip  external rotation    Knee flexion    Knee extension    Ankle dorsiflexion    Ankle plantarflexion    Ankle inversion    Ankle eversion     (Blank rows = not tested)    FUNCTIONAL TESTS:  BERG 05/03/21:  55/56    GAIT: Assistive device utilized: None Level of assistance: Modified independence Comments: left trunk shift, right hip/ilium higher, reduced left arm swing and left trunk rotation, reduced arm swing    TODAY'S TREATMENT:  05/26/21:  Therapeutic Exercise: Aerobic: Recumbent bike 7 min L 1  Supine:   Seated:  sit to stand x 10 S/L:  Standing:   Marching x 20, hip abd 2x 10 bil ( r foot on rise); Hip extension 2x10 bil;  Neuromuscular Re-education: side stepping 20 ft x 4 ; Walking with head turns L/R and up/down 35 ft x 4 each; Stepping up/over cones 25 ft x 6; Step ups 6 in x 10 bil (1 ue support); Stairs-up/down 5 steps 1 HR, x 5.  Manual Therapy:  Self Care:    05/24/20 Therapeutic Exercise: Aerobic: Supine:  SKTC 30 sec x 3 on R;  Bridging x 20;  Seated:  sit to stand x 15 S/L:  hip abd 2 x 10 bil;   Clams x 15  bil; QL stretch 20 sec x 3 on R;  Standing:   Marching x 20,  Neuromuscular Re-education: Tandem stance 30 sec x3 bil;  Walk/March 15 ft x 4;  side stepping 15 ft x 4; L/R weight shifts x 20;  standing head turns x 20; Gaze/vertical x 20;  Toe taps on 6 in step x 20, no UE support;  Manual Therapy: long axis traction / right hip and lumbar  Self Care:    05/19/20 Therapeutic Exercise: Aerobic: Supine:  LTR 10 sec x 10;  SKTC 30 sec x 3 on R;  Bridging x 20;  Seated:  sit to stand x 10  S/L: hip abd 2 x 10 bil;  QL stretch 20 sec x 3 on R;  Standing:  hip abd 2x10 bil; Marching x 20 Neuromuscular Re-education: Tandem stance 30 sec x3 bil;  SLS 10 sec x 2 bil;  Manual Therapy: long axis traction / right hip and lumbar  Self Care:    PATIENT EDUCATION:  PATIENT EDUCATION:  Education details: reviewed HEP Person educated: Patient Education  method: Consulting civil engineer, Media planner, and Handouts Education comprehension: verbalized understanding   HOME EXERCISE PROGRAM: [id number]  ASSESSMENT:  CLINICAL IMPRESSION: 05/26/21: Pt challenged with balance exercises today, but did well, no LOB. Cued for higher step height with side stepping and step ups. No dizziness with head turns when walking. Pt to benefit from continued strengthening and stabilization for R hip, and for progression of dynamic balance.   Objective impairments include decreased balance, decreased cognition, decreased mobility, difficulty walking, decreased ROM, decreased strength, and pain. These impairments are limiting patient from community activity and stairs . Personal factors including Age  and Fitness are also affecting patient's functional outcome. Patient will benefit from skilled PT to address above impairments and improve overall function.   REHAB POTENTIAL: Good  CLINICAL DECISION MAKING: Stable/uncomplicated  EVALUATION COMPLEXITY: Low   GOALS: Goals reviewed with patient? Yes  SHORT TERM GOALS:  STG Name Target Date Goal status  1 Patient will be independent in self management strategies to improve quality of life and functional outcomes. Baseline:  05/31/21 INITIAL  2 Patient will report at least 50% improvement in overall symptoms and/or function to demonstrate improved functional mobility Baseline: 0% 05/31/21 INITIAL  3  Baseline:  INITIAL  4  Baseline:    5  Baseline:    6  Baseline:    7  Baseline:     LONG TERM GOALS:   LTG Name Target Date Goal status  1 Patient will report at least 75% improvement in overall symptoms and/or function to demonstrate improved functional mobility Baseline:0% 06/28/21 INITIAL  2 Patient will report walking daily to improve overall function and endurance. Baseline:not currently performing walking program 06/28/21 INITIAL  3 Patient will be able to stand on either leg for 10 seconds to demonstrate improved  static balance Baseline: < 10 seconds 06/28/21 INITIAL  4  Baseline:    5  Baseline:    6  Baseline:    7  Baseline:       PLAN: PT FREQUENCY: 2x/week  PT DURATION: 8 weeks  PLANNED INTERVENTIONS: Therapeutic exercises, Therapeutic activity, Neuro Muscular re-education, Balance training, Gait training, Patient/Family education, Joint mobilization, Aquatic Therapy, Dry Needling, Electrical stimulation, Spinal mobilization, Cryotherapy, Moist heat, Traction, Ultrasound, and Manual therapy  PLAN FOR NEXT SESSION: hip strengthening, hip/general mobility, balance training    Lyndee Hensen, PT, DPT 11:01 AM  05/26/21

## 2021-05-27 ENCOUNTER — Ambulatory Visit: Payer: Medicare Other | Admitting: Registered"

## 2021-05-30 ENCOUNTER — Other Ambulatory Visit: Payer: Self-pay | Admitting: *Deleted

## 2021-05-30 ENCOUNTER — Other Ambulatory Visit: Payer: Self-pay

## 2021-05-30 MED ORDER — GABAPENTIN 600 MG PO TABS: 300.0000 mg | ORAL_TABLET | Freq: Every day | ORAL | 3 refills | Status: DC

## 2021-05-31 ENCOUNTER — Encounter: Payer: Medicare Other | Admitting: Physical Therapy

## 2021-06-02 ENCOUNTER — Encounter: Payer: Self-pay | Admitting: Physical Therapy

## 2021-06-02 ENCOUNTER — Other Ambulatory Visit: Payer: Self-pay

## 2021-06-02 ENCOUNTER — Ambulatory Visit: Payer: Medicare Other | Admitting: Physical Therapy

## 2021-06-02 DIAGNOSIS — M25551 Pain in right hip: Secondary | ICD-10-CM

## 2021-06-02 DIAGNOSIS — R262 Difficulty in walking, not elsewhere classified: Secondary | ICD-10-CM

## 2021-06-02 DIAGNOSIS — M6281 Muscle weakness (generalized): Secondary | ICD-10-CM

## 2021-06-02 NOTE — Therapy (Signed)
OUTPATIENT PHYSICAL THERAPY TREATMENT    Patient Name: Martha Gilmore MRN: [id number] DOB:[birth year], 81 y.o., female Today's Date: 06/02/2021   PT End of Session - 06/02/21 1031     Visit Number 6    Number of Visits 16    Authorization Type [employer] $20 co pay    Progress Note Due on Visit 10    PT Start Time 1017    PT Stop Time 1100    PT Time Calculation (min) 43 min    Equipment Utilized During Treatment Gait belt    Activity Tolerance Patient tolerated treatment well                 Past Medical History:  Diagnosis Date   Aortic insufficiency    mild to moderate by echo 12/2020   Atrial flutter ([place]) 12/30/2020   Cataracts, bilateral 09/23/2015   Clostridium difficile infection 04/16/2017   H/o C.diff due to clindamicyin 09/2016; two rounds of treatment for cure. Dr. 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 ([place]) 05/02/2021   Nonrheumatic aortic valve insufficiency 04/07/2021   Obstructive sleep apnea 03/28/2021   Aortic insufficiency 01/18/2021   Atrial flutter ([place]) 12/30/2020   Positive ANA (antinuclear antibody) 10/26/2020   Chondromalacia, knee, right 11/05/2017   Osteoarthrosis, localized, primary, knee, right 11/05/2017   Major depression, recurrent, chronic ([place]) 04/16/2017   Mixed hyperlipidemia  04/16/2017   Clostridium difficile infection 04/16/2017   Family history of early CAD 04/16/2017   Bilateral sensorineural hearing loss 02/06/2016   Postablative hypothyroidism 12/20/2015   History of Graves' disease 03/22/2015   OAB (overactive bladder) 12/16/2013   GERD (gastroesophageal reflux disease) 12/13/2012   Osteoarthritis of CMC joint of thumb 08/13/2012   Seasonal allergies 08/13/2012   Urge and stress incontinence 08/13/2012   Osteopenia 08/05/2012   DDD (degenerative disc disease), lumbosacral 10/22/2010    PCP: Leamon Arnt, MD  REFERRING PROVIDER: [name], MD   REFERRING DIAG: [phone] (ICD-10-CM) - Right hip pain  THERAPY DIAG:  Right hip pain  Muscle weakness (generalized)  Difficulty in walking, not elsewhere classified  ONSET DATE: 03/04/21  SUBJECTIVE:   SUBJECTIVE STATEMENT Pt states ongoing pain in R side of back. She notes that she feels like she has to use ice after she does her exercises.   PERTINENT HISTORY: Osteopenia, depression, atrial flutter, chronic right low back pain - sees chiro regularly.   PAIN:  Are you having pain? Yes VAS scale: 2/10 Pain location: hip Pain orientation: Right  PAIN TYPE: aching Pain description: intermittent  Aggravating factors: stairs, activity Relieving factors: rest  PRECAUTIONS: None  WEIGHT BEARING RESTRICTIONS No  FALLS:  Has patient fallen in last 6 months? Yes, Number of falls: 1  LIVING ENVIRONMENT: Lives in: House/apartment Stairs: Yes;  External: 2 steps; on right going up Has following equipment at home: Single point cane and Walker - 2 wheeled  OCCUPATION: retired  PLOF: Independent with basic ADLs  PATIENT GOALS to work on balance   OBJECTIVE:    COGNITION:  Overall cognitive status: Within functional limits for tasks assessed    LE MMT:  MMT Right 05/03/21 Left 05/03/21  Hip flexion 3+ 4-  Hip extension    Hip abduction 4- 4-  Hip adduction    Hip internal  rotation    Hip external rotation    Knee flexion    Knee extension    Ankle dorsiflexion    Ankle plantarflexion    Ankle inversion    Ankle eversion     (Blank rows = not tested)    FUNCTIONAL TESTS:  BERG 05/03/21:  55/56    GAIT: Assistive device utilized: None Level of assistance: Modified independence Comments: left trunk shift, right hip/ilium higher, reduced left arm swing and left trunk rotation, reduced arm swing    TODAY'S TREATMENT:  06/02/21: Therapeutic Exercise: Aerobic:  Supine:   Seated:   S/L: Hip Abd (pain)  Standing:   Marching x 20, hip abd 2x 10 bil ( r foot on rise); Hip extension 2x10 bil; side stepping 25 ft x 6; lateral step ups 4 in x 10 bil;  Neuromuscular Re-education:  Manual Therapy:  Self Care: Heel lift (eva foam 1 layer) added to L shoe , education for use and precautions.     05/26/21:  Therapeutic Exercise: Aerobic: Recumbent bike 7 min L 1  Supine:   Seated:  sit to stand x 10 S/L:  Standing:   Marching x 20, hip abd 2x 10 bil ( r foot on rise); Hip extension 2x10 bil;  Neuromuscular Re-education: side stepping 20 ft x 4 ; Walking with head turns L/R and up/down 35 ft x 4 each; Stepping up/over cones 25 ft x 6; Step ups 6 in x 10 bil (1 ue support); Stairs-up/down 5 steps 1 HR, x 5.  Manual Therapy:  Self Care:    05/24/20 Therapeutic Exercise: Aerobic: Supine:  SKTC 30 sec x 3 on R;  Bridging x 20;  Seated:  sit to stand x 15 S/L:  hip abd 2 x 10 bil;   Clams x 15  bil; QL stretch 20 sec x 3 on R;  Standing:   Marching x 20,  Neuromuscular Re-education: Tandem stance 30 sec x3 bil;  Walk/March 15 ft x 4;  side stepping 15 ft x 4; L/R weight shifts x 20;  standing head turns x 20; Gaze/vertical x 20;  Toe taps on 6 in step x 20, no UE support;  Manual Therapy: long axis traction / right hip and lumbar  Self Care:     PATIENT EDUCATION:  PATIENT EDUCATION:  Education details: reviewed HEP Person educated:  Patient Education method: Explanation, Media planner, and Handouts Education comprehension: verbalized understanding   HOME EXERCISE PROGRAM: [id number]  ASSESSMENT:  CLINICAL IMPRESSION: 05/26/21:  Pt continues to have variable pain in her back. Mostly has increased pain in R side of back with increased standing.Added heel lift to L shoe today, for more even spine/hip alignment. Pt with good comfort today, will continue to assess effects. She continues to have weakness and instability in R hip that is also effecting posture.  She is doing better with balance and stability. Discussed HEP and only doing exercises that she feels are helpful (no pain). Pt progressing well, will  benefit from continued strengthening for R hip and back.   Objective impairments include decreased balance, decreased cognition, decreased mobility, difficulty walking, decreased ROM, decreased strength, and pain. These impairments are limiting patient from community activity and stairs . Personal factors including Age and Fitness are also affecting patient's functional outcome. Patient will benefit from skilled PT to address above impairments and improve overall function.   REHAB POTENTIAL: Good  CLINICAL DECISION MAKING: Stable/uncomplicated  EVALUATION COMPLEXITY: Low   GOALS: Goals reviewed with patient? Yes  SHORT TERM GOALS:  STG Name Target Date Goal status  1 Patient will be independent in self management strategies to improve quality of life and functional outcomes. Baseline:  05/31/21 INITIAL  2 Patient will report at least 50% improvement in overall symptoms and/or function to demonstrate improved functional mobility Baseline: 0% 05/31/21 INITIAL  3  Baseline:  INITIAL  4  Baseline:    5  Baseline:    6  Baseline:    7  Baseline:     LONG TERM GOALS:   LTG Name Target Date Goal status  1 Patient will report at least 75% improvement in overall symptoms and/or function to demonstrate improved  functional mobility Baseline:0% 06/28/21 INITIAL  2 Patient will report walking daily to improve overall function and endurance. Baseline:not currently performing walking program 06/28/21 INITIAL  3 Patient will be able to stand on either leg for 10 seconds to demonstrate improved static balance Baseline: < 10 seconds 06/28/21 INITIAL  4  Baseline:    5  Baseline:    6  Baseline:    7  Baseline:       PLAN: PT FREQUENCY: 2x/week  PT DURATION: 8 weeks  PLANNED INTERVENTIONS: Therapeutic exercises, Therapeutic activity, Neuro Muscular re-education, Balance training, Gait training, Patient/Family education, Joint mobilization, Aquatic Therapy, Dry Needling, Electrical stimulation, Spinal mobilization, Cryotherapy, Moist heat, Traction, Ultrasound, and Manual therapy  PLAN FOR NEXT SESSION: hip strengthening, hip/general mobility, balance training    Lyndee Hensen, PT, DPT 12:11 PM  06/02/21

## 2021-06-06 ENCOUNTER — Encounter: Payer: Self-pay | Admitting: Cardiology

## 2021-06-07 ENCOUNTER — Ambulatory Visit: Payer: Medicare Other | Admitting: Physical Therapy

## 2021-06-07 ENCOUNTER — Other Ambulatory Visit: Payer: Self-pay

## 2021-06-07 ENCOUNTER — Encounter: Payer: Self-pay | Admitting: Physical Therapy

## 2021-06-07 DIAGNOSIS — M25551 Pain in right hip: Secondary | ICD-10-CM

## 2021-06-07 DIAGNOSIS — R262 Difficulty in walking, not elsewhere classified: Secondary | ICD-10-CM | POA: Diagnosis not present

## 2021-06-07 DIAGNOSIS — M6281 Muscle weakness (generalized): Secondary | ICD-10-CM | POA: Diagnosis not present

## 2021-06-07 NOTE — Therapy (Addendum)
OUTPATIENT PHYSICAL THERAPY TREATMENT    Patient Name: Lorna San Marino MRN: [id number] DOB:12/30/1940, 81 y.o., female Today's Date: 06/07/2021   PT End of Session - 06/07/21 1153     Visit Number 7    Number of Visits 16    Authorization Type [employer] $20 co pay    Progress Note Due on Visit 10    PT Start Time 1103    PT Stop Time 1143    PT Time Calculation (min) 40 min    Equipment Utilized During Treatment Gait belt    Activity Tolerance Patient tolerated treatment well                  Past Medical History:  Diagnosis Date   Aortic insufficiency    mild to moderate by echo 12/2020   Atrial flutter ([place]) 12/30/2020   Cataracts, bilateral 09/23/2015   Clostridium difficile infection 04/16/2017   H/o C.diff due to clindamicyin 09/2016; two rounds of treatment for cure. Dr. 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 ([place]) 05/02/2021   Nonrheumatic aortic valve insufficiency 04/07/2021   Obstructive sleep apnea 03/28/2021   Aortic insufficiency 01/18/2021   Atrial flutter ([place]) 12/30/2020   Positive ANA (antinuclear antibody) 10/26/2020   Chondromalacia, knee, right 11/05/2017   Osteoarthrosis, localized, primary, knee, right 11/05/2017   Major depression, recurrent, chronic ([place]) 04/16/2017   Mixed hyperlipidemia  04/16/2017   Clostridium difficile infection 04/16/2017   Family history of early CAD 04/16/2017   Bilateral sensorineural hearing loss 02/06/2016   Postablative hypothyroidism 12/20/2015   History of Graves' disease 03/22/2015   OAB (overactive bladder) 12/16/2013   GERD (gastroesophageal reflux disease) 12/13/2012   Osteoarthritis of CMC joint of thumb 08/13/2012   Seasonal allergies 08/13/2012   Urge and stress incontinence 08/13/2012   Osteopenia 08/05/2012   DDD (degenerative disc disease), lumbosacral 10/22/2010    PCP: Leamon Arnt, MD  REFERRING PROVIDER: [name], MD   REFERRING DIAG: [phone] (ICD-10-CM) - Right hip pain  THERAPY DIAG:  Right hip pain  Muscle weakness (generalized)  Difficulty in walking, not elsewhere classified  ONSET DATE: 03/04/21  SUBJECTIVE:   SUBJECTIVE STATEMENT Pt states ongoing pain in R side of back. She notes that she feels like she has to use ice after she does her exercises.   PERTINENT HISTORY: Osteopenia, depression, atrial flutter, chronic right low back pain - sees chiro regularly.   PAIN:  Are you having pain? Yes VAS scale: 2/10 Pain location: hip Pain orientation: Right  PAIN TYPE: aching Pain description: intermittent  Aggravating factors: stairs, activity Relieving factors: rest  PRECAUTIONS: None  WEIGHT BEARING RESTRICTIONS No  FALLS:  Has patient fallen in last 6 months? Yes, Number of falls: 1  LIVING ENVIRONMENT: Lives in: House/apartment Stairs:  Yes; External: 2 steps; on right going up Has following equipment at home: Single point cane and Walker - 2 wheeled  OCCUPATION: retired  PLOF: Independent with basic ADLs  PATIENT GOALS to work on balance   OBJECTIVE:    COGNITION:  Overall cognitive status: Within functional limits for tasks assessed    LE MMT:  MMT Right 05/03/21 Left 05/03/21  Hip flexion 3+ 4-  Hip extension    Hip abduction 4- 4-  Hip adduction    Hip internal  rotation    Hip external rotation    Knee flexion    Knee extension    Ankle dorsiflexion    Ankle plantarflexion    Ankle inversion    Ankle eversion     (Blank rows = not tested)    FUNCTIONAL TESTS:  BERG 05/03/21:  55/56    GAIT: Assistive device utilized: None Level of assistance: Modified independence Comments: left trunk shift, right hip/ilium higher, reduced left arm swing and left trunk rotation, reduced arm swing    TODAY'S TREATMENT:  06/07/21: Therapeutic Exercise: Aerobic: Recumbent bike L2 x 5 min;  Supine:  Bridging x 20; SLR x 10 on R;  Seated:  Sit to stand x 10;  S/L:  clams x 20 on R;  Standing:   Marching x 20, hip abd 2x 10 bil ;  Mini squats x 10; bil;   Neuromuscular Re-education: side stepping 25 ft x 6;  ambulation 35 ft x 8, improved posture and mechanics with heel lift. Bwd walking 25 ft x 4;  Manual Therapy:  Self Care: Heel lift (eva foam, now has 2 layers) added to L shoe , education for use and precautions.    06/02/21: Therapeutic Exercise: Aerobic:  Supine:   Seated:   S/L: Hip Abd (pain)  Standing:   Marching x 20, hip abd 2x 10 bil ( r foot on rise); Hip extension 2x10 bil; side stepping 25 ft x 6; lateral step ups 4 in x 10 bil;  Neuromuscular Re-education:  Manual Therapy:  Self Care: Heel lift (eva foam 1 layer) added to L shoe , education for use and precautions.     05/26/21:  Therapeutic Exercise: Aerobic: Recumbent bike 7 min L 1  Supine:   Seated:  sit to stand x 10 S/L:  Standing:   Marching x 20, hip abd 2x 10 bil ( r foot on rise); Hip extension 2x10 bil;  Neuromuscular Re-education: side stepping 20 ft x 4 ; Walking with head turns L/R and up/down 35 ft x 4 each; Stepping up/over cones 25 ft x 6; Step ups 6 in x 10 bil (1 ue support); Stairs-up/down 5 steps 1 HR, x 5.  Manual Therapy:  Self Care:    05/24/20 Therapeutic Exercise: Aerobic: Supine:  SKTC 30 sec x 3 on R;  Bridging x 20;  Seated:  sit to stand x  15 S/L:  hip abd 2 x 10 bil;   Clams x 15  bil; QL stretch 20 sec x 3 on R;  Standing:   Marching x 20,  Neuromuscular Re-education: Tandem stance 30 sec x3 bil;  Walk/March 15 ft x 4;  side stepping 15 ft x 4; L/R weight shifts x 20;  standing head turns x 20; Gaze/vertical x 20;  Toe taps on 6 in step x 20, no UE support;  Manual Therapy: long axis traction / right hip and lumbar  Self Care:     PATIENT EDUCATION:  PATIENT EDUCATION:  Education details: reviewed HEP Person educated: Patient Education method: Explanation, Demonstration, and Handouts Education comprehension: verbalized understanding   HOME EXERCISE PROGRAM: [id number]  ASSESSMENT:  CLINICAL IMPRESSION: 06/07/21: Pt showing good improvements with mobility. She is feeling more confident with mobility, stairs, and balance when out in community. She does continue to have weakness in R hip that is limiting. HEP reviewed and updated to include focus on hip strength. Pt to benefit from continued care for strengthening. Heel lift height increased today. Pt with good comfort and posture with ambulation today, will continue to assess next visit. Will work towards d/c in next couple weeks.    Objective impairments include decreased balance, decreased cognition, decreased mobility, difficulty walking, decreased ROM, decreased strength, and pain. These impairments are limiting patient from community activity and stairs . Personal factors including Age and Fitness are also affecting patient's functional outcome. Patient will benefit from skilled PT to address above impairments and improve overall function.   REHAB POTENTIAL: Good  CLINICAL DECISION MAKING: Stable/uncomplicated  EVALUATION COMPLEXITY: Low   GOALS: Goals reviewed with patient? Yes  SHORT TERM GOALS:  STG Name Target Date Goal status  1 Patient will be independent in self management strategies to improve quality of life and functional outcomes. Baseline:   05/31/21 INITIAL  2 Patient will report at least 50% improvement in overall symptoms and/or function to demonstrate improved functional mobility Baseline: 0% 05/31/21 INITIAL  3  Baseline:  INITIAL  4  Baseline:    5  Baseline:    6  Baseline:    7  Baseline:     LONG TERM GOALS:   LTG Name Target Date Goal status  1 Patient will report at least 75% improvement in overall symptoms and/or function to demonstrate improved functional mobility Baseline:0% 06/28/21 INITIAL  2 Patient will report walking daily to improve overall function and endurance. Baseline:not currently performing walking program 06/28/21 INITIAL  3 Patient will be able to stand on either leg for 10 seconds to demonstrate improved static balance Baseline: < 10 seconds 06/28/21 INITIAL  4  Baseline:    5  Baseline:    6  Baseline:    7  Baseline:       PLAN: PT FREQUENCY: 2x/week  PT DURATION: 8 weeks  PLANNED INTERVENTIONS: Therapeutic exercises, Therapeutic activity, Neuro Muscular re-education, Balance training, Gait training, Patient/Family education, Joint mobilization, Aquatic Therapy, Dry Needling, Electrical stimulation, Spinal mobilization, Cryotherapy, Moist heat, Traction, Ultrasound, and Manual therapy  PLAN FOR NEXT SESSION:   Lyndee Hensen, PT, DPT 11:53 AM  06/07/21

## 2021-06-09 ENCOUNTER — Encounter: Payer: Medicare Other | Admitting: Physical Therapy

## 2021-06-09 ENCOUNTER — Ambulatory Visit (INDEPENDENT_AMBULATORY_CARE_PROVIDER_SITE_OTHER): Payer: Medicare Other

## 2021-06-09 ENCOUNTER — Other Ambulatory Visit: Payer: Self-pay

## 2021-06-09 DIAGNOSIS — Z0000 Encounter for general adult medical examination without abnormal findings: Secondary | ICD-10-CM | POA: Diagnosis not present

## 2021-06-09 NOTE — Progress Notes (Signed)
Virtual Visit via Telephone Note  I connected with  Martha Gilmore on 06/09/21 at  8:45 AM EST by telephone and verified that I am speaking with the correct person using two identifiers.  Medicare Annual Wellness visit completed telephonically due to Covid-19 pandemic.   Persons participating in this call: This Health Coach and this patient.   Location: Patient: Home Provider: Office    I discussed the limitations, risks, security and privacy concerns of performing an evaluation and management service by telephone and the availability of in person appointments. The patient expressed understanding and agreed to proceed.  Unable to perform video visit due to video visit attempted and failed and/or patient does not have video capability.   Some vital signs may be absent or patient reported.   Willette Brace, LPN   Subjective:   Martha Gilmore is a 81 y.o. female who presents for Medicare Annual (Subsequent) preventive examination.  Review of Systems     Cardiac Risk Factors include: advanced age (>34men, >33 women);dyslipidemia;obesity (BMI >30kg/m2)     Objective:    There were no vitals filed for this visit. There is no height or weight on file to calculate BMI.  Advanced Directives 06/09/2021 05/06/2021 04/13/2021 03/04/2021 06/26/2020 06/03/2020 03/17/2019  Does Patient Have a Medical Advance Directive? Yes Yes Yes Yes Yes Yes Yes  Type of Programmer, systems - Healthcare Power of [place];Living will [place];Living will [place];Living will [place];Living will  Does patient want to make changes to medical advance directive? - - - No - Patient declined - - -  Copy of [place] in Chart? No - copy requested Yes - validated most recent copy scanned in chart (See row information) - No - copy requested No - copy requested No - copy requested No -  copy requested  Would patient like information on creating a medical advance directive? - - - - - - No - Patient declined    Current Medications (verified) Outpatient Encounter Medications as of 06/09/2021  Medication Sig   apixaban (ELIQUIS) 5 MG TABS tablet Take 1 tablet (5 mg total) by mouth 2 (two) times daily.   atorvastatin (LIPITOR) 10 MG tablet TAKE 1 TABLET BY MOUTH EVERYDAY AT BEDTIME   citalopram (CELEXA) 20 MG tablet Take 1 tablet (20 mg total) by mouth daily.   diltiazem (CARDIZEM CD) 180 MG 24 hr capsule Take 1 capsule (180 mg total) by mouth daily.   gabapentin (NEURONTIN) 600 MG tablet Take 0.5 tablets (300 mg total) by mouth at bedtime.   levothyroxine (SYNTHROID) 100 MCG tablet Take 1 tablet (100 mcg total) by mouth daily.   Multiple Vitamins-Minerals (PRESERVISION AREDS PO) Take 1 capsule by mouth in the morning and at bedtime.   omeprazole (PRILOSEC) 20 MG capsule TAKE 1 CAPSULE BY MOUTH EVERY DAY   oxybutynin (DITROPAN-XL) 10 MG 24 hr tablet Take 1 tablet (10 mg total) by mouth at bedtime.   Polyethyl Glycol-Propyl Glycol (SYSTANE OP) Place 1 drop into both eyes daily as needed (DRY EYES).   traMADol (ULTRAM) 50 MG tablet Take 1 tablet (50 mg total) by mouth 3 (three) times daily as needed.   No facility-administered encounter medications on file as of 06/09/2021.    Allergies (verified) Aspirin, Meperidine, Penicillins, Statins, Cephalexin, Clindamycin/lincomycin, Demerol  [meperidine hcl], and Tape   History: Past Medical History:  Diagnosis Date   Aortic insufficiency  mild to moderate by echo 12/2020   Atrial flutter ([place]) 12/30/2020   Cataracts, bilateral 09/23/2015   Clostridium difficile infection 04/16/2017   H/o C.diff due to clindamicyin 09/2016; two rounds of treatment for cure. Dr. Benson [place]   Depression    Hyperlipidemia    Hypertension    OAB (overactive bladder) 12/16/2013   Osteopenia 08/05/2012   Dexa scan 07/2012 mild osteopenia, T = -1.1    Seasonal allergies    Urge and stress incontinence    Past Surgical History:  Procedure Laterality Date   APPENDECTOMY  1971   CARDIOVERSION N/A 04/13/2021   Procedure: CARDIOVERSION;  Surgeon: Werner Lean, MD;  Location: [facility];  Service: Cardiovascular;  Laterality: N/A;   CESAREAN SECTION     CHOLECYSTECTOMY     Teeth Implants     TRIGGER FINGER RELEASE     VAGINAL HYSTERECTOMY     Family History  Problem Relation Age of Onset   Heart disease Mother    Hypertension Mother    Cancer Father    Heart disease Father    Lung cancer Sister    Heart disease Sister    Diabetes Sister    Kidney cancer Sister    Brain cancer Brother    Social History   Socioeconomic History   Marital status: Widowed    Spouse name: Not on file   Number of children: Not on file   Years of education: Not on file   Highest education level: Not on file  Occupational History   Not on file  Tobacco Use   Smoking status: Never   Smokeless tobacco: Never  Vaping Use   Vaping Use: Never used  Substance and Sexual Activity   Alcohol use: No   Drug use: No   Sexual activity: Never    Partners: Male  Other Topics Concern   Not on file  Social History Narrative   Not on file   Social Determinants of Health   Financial Resource Strain: Low Risk    Difficulty of Paying Living Expenses: Not hard at all  Food Insecurity: No Food Insecurity   Worried About Charity fundraiser in the Last Year: Never true   [place] in the Last Year: Never true  Transportation Needs: No Transportation Needs   Lack of Transportation (Medical): No   Lack of Transportation (Non-Medical): No  Physical Activity: Inactive   Days of Exercise per Week: 0 days   Minutes of Exercise per Session: 0 min  Stress: No Stress Concern Present   Feeling of Stress : Not at all  Social Connections: Moderately Integrated   Frequency of Communication with Friends and Family: Once a week   Frequency of  Social Gatherings with Friends and Family: More than three times a week   Attends Religious Services: More than 4 times per year   Active Member of [employer] or Organizations: Yes   Attends Archivist Meetings: 1 to 4 times per year   Marital Status: Widowed    Tobacco Counseling Counseling given: Not Answered   Clinical Intake:  Pre-visit preparation completed: Yes  Pain : No/denies pain     BMI - recorded: 33.2 Nutritional Status: BMI > 30  Obese Nutritional Risks: None Diabetes: No  How often do you need to have someone help you when you read instructions, pamphlets, or other written materials from your doctor or pharmacy?: 1 - Never  Diabetic?no  Interpreter Needed?: No  Information entered by ::  Charlott Rakes, LPN   Activities of Daily Living In your present state of health, do you have any difficulty performing the following activities: 06/09/2021  Hearing? Y  Comment wears a hearing aids  Vision? N  Difficulty concentrating or making decisions? N  Walking or climbing stairs? N  Dressing or bathing? N  Doing errands, shopping? N  Preparing Food and eating ? N  Using the Toilet? N  In the past six months, have you accidently leaked urine? Y  Comment wears a pad  Do you have problems with loss of bowel control? N  Managing your Medications? N  Managing your Finances? N  Housekeeping or managing your Housekeeping? N  Some recent data might be hidden    Patient Care Team: Leamon Arnt, MD as PCP - General (Family Medicine) Berniece Salines, DO as PCP - Cardiology (Cardiology) Susette Racer, MD as Consulting Physician (Endocrinology) Carol Ada, MD as Consulting Physician (Gastroenterology) Roseanne Kaufman, MD as Consulting Physician (Orthopedic Surgery) Rolm Bookbinder, MD as Consulting Physician (Dermatology) Stephannie Li, OD as Consulting Physician (Ophthalmology) [facility] as Consulting Physician  (Audiology) Madelin Rear, [facility] as Pharmacist (Pharmacist) Reece Agar, MD as Consulting Physician (Pain Medicine)  Indicate any recent Medical Services you may have received from other than [facility] providers in the past year (date may be approximate).     Assessment:   This is a routine wellness examination for Bernece.  Hearing/Vision screen Hearing Screening - Comments:: Pt wears hearing aids  Vision Screening - Comments:: Pt follows up with [facility] for annual eye exams   Dietary issues and exercise activities discussed: Current Exercise Habits: Structured exercise class, Type of exercise: Other - see comments (PT), Time (Minutes): 45, Frequency (Times/Week): 2, Weekly Exercise (Minutes/Week): 90   Goals Addressed             This Visit's Progress    Patient Stated       Lose weight and wear hearing aids daily        Depression Screen PHQ 2/9 Scores 06/09/2021 03/02/2021 10/07/2020 06/03/2020 04/29/2020 03/17/2019 02/28/2018  PHQ - 2 Score 0 0 0 0 0 0 0  PHQ- 9 Score - 0 2 - - - 3    Fall Risk Fall Risk  06/09/2021 06/03/2020 04/29/2020 08/29/2019 03/17/2019  Falls in the past year? 1 0 0 1 0  Number falls in past yr: 1 0 0 0 -  Injury with Fall? 1 0 0 0 0  Comment left hip - - - -  Risk for fall due to : Impaired vision Impaired vision;Impaired balance/gait - - -  Follow up Falls prevention discussed Falls prevention discussed - - Falls evaluation completed;Education provided;Falls prevention discussed    FALL RISK PREVENTION PERTAINING TO THE HOME:  Any stairs in or around the home? Yes  If so, are there any without handrails? No  Home free of loose throw rugs in walkways, pet beds, electrical cords, etc? Yes  Adequate lighting in your home to reduce risk of falls? Yes   ASSISTIVE DEVICES UTILIZED TO PREVENT FALLS:  Life alert? No  Use of a cane, walker or w/c? Yes  Grab bars in the bathroom? No  Shower chair or bench in shower? No  Elevated toilet seat or a  handicapped toilet? No   TIMED UP AND GO:  Was the test performed? No .   Cognitive Function: MMSE - Mini Mental State Exam 02/28/2018  Orientation to time 5  Orientation to Place 5  Registration 3  Attention/ Calculation 5  Recall 2  Language- name 2 objects 2  Language- repeat 1  Language- follow 3 step command 3  Language- read & follow direction 1  Write a sentence 1  Copy design 1  Total score 29     6CIT Screen 06/09/2021 06/03/2020  What Year? 0 points 0 points  What month? 0 points 0 points  What time? 0 points -  Count back from 20 0 points 0 points  Months in reverse 0 points 0 points  Repeat phrase 2 points 2 points  Total Score 2 -    Immunizations Immunization History  Administered Date(s) Administered   Fluad Quad(high Dose 65+) 01/13/2021   Influenza Split 01/15/2012   Influenza, High Dose Seasonal PF 01/30/2013, 01/05/2015, 02/17/2016, 02/02/2017, 01/18/2018, 12/11/2018, 12/23/2019, 01/13/2021   Influenza, Seasonal, Injecte, Preservative Fre 01/21/2014   Influenza-Unspecified 01/09/2011, 01/15/2012, 01/21/2014, 01/22/2018   PFIZER(Purple Top)SARS-COV-2 Vaccination 05/15/2019, 06/05/2019, 01/20/2020, 08/16/2020   Pfizer Covid-19 Vaccine Bivalent Booster 29yrs & up 01/14/2021   Pneumococcal Conjugate-13 01/05/2015   Pneumococcal Polysaccharide-23 04/24/2006, 11/18/2007, 05/30/2012   Tdap 08/17/2010   Zoster Recombinat (Shingrix) 06/29/2018, 11/01/2018   Zoster, Live 04/25/1995      Flu Vaccine status: Up to date  Pneumococcal vaccine status: Up to date  Covid-19 vaccine status: Completed vaccines  Qualifies for Shingles Vaccine? Yes   Zostavax completed Yes   Shingrix Completed?: Yes  Screening Tests Health Maintenance  Topic Date Due   MAMMOGRAM  05/05/2022   DEXA SCAN  08/18/2023   Pneumonia Vaccine 32+ Years old  Completed   INFLUENZA VACCINE  Completed   COVID-19 Vaccine  Completed   Zoster Vaccines- Shingrix  Completed   HPV  VACCINES  Aged Out   TETANUS/TDAP  Discontinued   Hepatitis C Screening  Discontinued    Health Maintenance  There are no preventive care reminders to display for this patient.  Colorectal cancer screening: No longer required.   Mammogram status: Completed 05/05/21. Repeat every year  Bone Density status: Completed 08/17/20. Results reflect: Bone density results: OSTEOPENIA. Repeat every 3 years.   Additional Screening:  Hepatitis C Screening: does not qualify;   Vision Screening: Recommended annual ophthalmology exams for early detection of glaucoma and other disorders of the eye. Is the patient up to date with their annual eye exam?  Yes  Who is the provider or what is the name of the office in which the patient attends annual eye exams? [facility]  If pt is not established with a provider, would they like to be referred to a provider to establish care? No .   Dental Screening: Recommended annual dental exams for proper oral hygiene  Community Resource Referral / Chronic Care Management: CRR required this visit?  No   CCM required this visit?  No      Plan:     I have personally reviewed and noted the following in the patients chart:   Medical and social history Use of alcohol, tobacco or illicit drugs  Current medications and supplements including opioid prescriptions.  Functional ability and status Nutritional status Physical activity Advanced directives List of other physicians Hospitalizations, surgeries, and ER visits in previous 12 months Vitals Screenings to include cognitive, depression, and falls Referrals and appointments  In addition, I have reviewed and discussed with patient certain preventive protocols, quality metrics, and best practice recommendations. A written personalized care plan for preventive services as well as general preventive health  recommendations were provided to patient.     Willette Brace, LPN   [id number]   Nurse Notes:  none

## 2021-06-09 NOTE — Patient Instructions (Signed)
Martha Gilmore , Thank you for taking time to come for your Medicare Wellness Visit. I appreciate your ongoing commitment to your health goals. Please review the following plan we discussed and let me know if I can assist you in the future.   Screening recommendations/referrals: Colonoscopy: No longer required  Mammogram: Done 05/05/21 repeat every year  Bone Density: Done 08/17/20 repeat veery 3 year  Recommended yearly ophthalmology/optometry visit for glaucoma screening and checkup Recommended yearly dental visit for hygiene and checkup  Vaccinations: Influenza vaccine: Done 01/13/21 repeat every year  Pneumococcal vaccine: Up to date Tdap vaccine: Done 08/17/10 repeat every 10 years  Shingles vaccine: Completed 3/7, & 11/01/18   Covid-19:Completed 1/21, 2/11, 01/20/20 & 4/25, 01/14/21  Advanced directives: Please bring a copy of your health care power of attorney and living will to the office at your convenience.  Conditions/risks identified: Lose weight and wear hearing aids daily   Next appointment: Follow up in one year for your annual wellness visit    Preventive Care 65 Years and Older, Female Preventive care refers to lifestyle choices and visits with your health care provider that can promote health and wellness. What does preventive care include? A yearly physical exam. This is also called an annual well check. Dental exams once or twice a year. Routine eye exams. Ask your health care provider how often you should have your eyes checked. Personal lifestyle choices, including: Daily care of your teeth and gums. Regular physical activity. Eating a healthy diet. Avoiding tobacco and drug use. Limiting alcohol use. Practicing safe sex. Taking low-dose aspirin every day. Taking vitamin and mineral supplements as recommended by your health care provider. What happens during an annual well check? The services and screenings done by your health care provider during your annual well  check will depend on your age, overall health, lifestyle risk factors, and family history of disease. Counseling  Your health care provider may ask you questions about your: Alcohol use. Tobacco use. Drug use. Emotional well-being. Home and relationship well-being. Sexual activity. Eating habits. History of falls. Memory and ability to understand (cognition). Work and work Statistician. Reproductive health. Screening  You may have the following tests or measurements: Height, weight, and BMI. Blood pressure. Lipid and cholesterol levels. These may be checked every 5 years, or more frequently if you are over 34 years old. Skin check. Lung cancer screening. You may have this screening every year starting at age 33 if you have a 30-pack-year history of smoking and currently smoke or have quit within the past 15 years. Fecal occult blood test (FOBT) of the stool. You may have this test every year starting at age 71. Flexible sigmoidoscopy or colonoscopy. You may have a sigmoidoscopy every 5 years or a colonoscopy every 10 years starting at age 33. Hepatitis C blood test. Hepatitis B blood test. Sexually transmitted disease (STD) testing. Diabetes screening. This is done by checking your blood sugar (glucose) after you have not eaten for a while (fasting). You may have this done every 1-3 years. Bone density scan. This is done to screen for osteoporosis. You may have this done starting at age 36. Mammogram. This may be done every 1-2 years. Talk to your health care provider about how often you should have regular mammograms. Talk with your health care provider about your test results, treatment options, and if necessary, the need for more tests. Vaccines  Your health care provider may recommend certain vaccines, such as: Influenza vaccine. This is recommended every  year. Tetanus, diphtheria, and acellular pertussis (Tdap, Td) vaccine. You may need a Td booster every 10 years. Zoster  vaccine. You may need this after age 7. Pneumococcal 13-valent conjugate (PCV13) vaccine. One dose is recommended after age 33. Pneumococcal polysaccharide (PPSV23) vaccine. One dose is recommended after age 5. Talk to your health care provider about which screenings and vaccines you need and how often you need them. This information is not intended to replace advice given to you by your health care provider. Make sure you discuss any questions you have with your health care provider. Document Released: 05/07/2015 Document Revised: 12/29/2015 Document Reviewed: 02/09/2015 Elsevier Interactive Patient Education  2017 [place] Prevention in the Home Falls can cause injuries. They can happen to people of all ages. There are many things you can do to make your home safe and to help prevent falls. What can I do on the outside of my home? Regularly fix the edges of walkways and driveways and fix any cracks. Remove anything that might make you trip as you walk through a door, such as a raised step or threshold. Trim any bushes or trees on the path to your home. Use bright outdoor lighting. Clear any walking paths of anything that might make someone trip, such as rocks or tools. Regularly check to see if handrails are loose or broken. Make sure that both sides of any steps have handrails. Any raised decks and porches should have guardrails on the edges. Have any leaves, snow, or ice cleared regularly. Use sand or salt on walking paths during winter. Clean up any spills in your garage right away. This includes oil or grease spills. What can I do in the bathroom? Use night lights. Install grab bars by the toilet and in the tub and shower. Do not use towel bars as grab bars. Use non-skid mats or decals in the tub or shower. If you need to sit down in the shower, use a plastic, non-slip stool. Keep the floor dry. Clean up any water that spills on the floor as soon as it happens. Remove  soap buildup in the tub or shower regularly. Attach bath mats securely with double-sided non-slip rug tape. Do not have throw rugs and other things on the floor that can make you trip. What can I do in the bedroom? Use night lights. Make sure that you have a light by your bed that is easy to reach. Do not use any sheets or blankets that are too big for your bed. They should not hang down onto the floor. Have a firm chair that has side arms. You can use this for support while you get dressed. Do not have throw rugs and other things on the floor that can make you trip. What can I do in the kitchen? Clean up any spills right away. Avoid walking on wet floors. Keep items that you use a lot in easy-to-reach places. If you need to reach something above you, use a strong step stool that has a grab bar. Keep electrical cords out of the way. Do not use floor polish or wax that makes floors slippery. If you must use wax, use non-skid floor wax. Do not have throw rugs and other things on the floor that can make you trip. What can I do with my stairs? Do not leave any items on the stairs. Make sure that there are handrails on both sides of the stairs and use them. Fix handrails that are broken or  loose. Make sure that handrails are as long as the stairways. Check any carpeting to make sure that it is firmly attached to the stairs. Fix any carpet that is loose or worn. Avoid having throw rugs at the top or bottom of the stairs. If you do have throw rugs, attach them to the floor with carpet tape. Make sure that you have a light switch at the top of the stairs and the bottom of the stairs. If you do not have them, ask someone to add them for you. What else can I do to help prevent falls? Wear shoes that: Do not have high heels. Have rubber bottoms. Are comfortable and fit you well. Are closed at the toe. Do not wear sandals. If you use a stepladder: Make sure that it is fully opened. Do not climb a  closed stepladder. Make sure that both sides of the stepladder are locked into place. Ask someone to hold it for you, if possible. Clearly mark and make sure that you can see: Any grab bars or handrails. First and last steps. Where the edge of each step is. Use tools that help you move around (mobility aids) if they are needed. These include: Canes. Walkers. Scooters. Crutches. Turn on the lights when you go into a dark area. Replace any light bulbs as soon as they burn out. Set up your furniture so you have a clear path. Avoid moving your furniture around. If any of your floors are uneven, fix them. If there are any pets around you, be aware of where they are. Review your medicines with your doctor. Some medicines can make you feel dizzy. This can increase your chance of falling. Ask your doctor what other things that you can do to help prevent falls. This information is not intended to replace advice given to you by your health care provider. Make sure you discuss any questions you have with your health care provider. Document Released: 02/04/2009 Document Revised: 09/16/2015 Document Reviewed: 05/15/2014 Elsevier Interactive Patient Education  2017 [employer].

## 2021-06-14 ENCOUNTER — Encounter: Payer: Medicare Other | Admitting: Physical Therapy

## 2021-06-16 ENCOUNTER — Encounter: Payer: Self-pay | Admitting: Physical Therapy

## 2021-06-16 ENCOUNTER — Other Ambulatory Visit: Payer: Self-pay

## 2021-06-16 ENCOUNTER — Ambulatory Visit: Payer: Medicare Other | Admitting: Physical Therapy

## 2021-06-16 DIAGNOSIS — R262 Difficulty in walking, not elsewhere classified: Secondary | ICD-10-CM | POA: Diagnosis not present

## 2021-06-16 DIAGNOSIS — M25551 Pain in right hip: Secondary | ICD-10-CM | POA: Diagnosis not present

## 2021-06-16 DIAGNOSIS — M6281 Muscle weakness (generalized): Secondary | ICD-10-CM

## 2021-06-16 NOTE — Therapy (Signed)
OUTPATIENT PHYSICAL THERAPY TREATMENT /Discharge   Patient Name: Martha Gilmore MRN: [id number] DOB:April 09, 1941, 81 y.o., female Today's Date: 06/16/2021   PT End of Session - 06/16/21 1209     Visit Number 8    Number of Visits 16    Authorization Type [employer] $20 co pay    Progress Note Due on Visit 10    PT Start Time 1030    PT Stop Time 1100    PT Time Calculation (min) 30 min    Equipment Utilized During Treatment Gait belt    Activity Tolerance Patient tolerated treatment well                   Past Medical History:  Diagnosis Date   Aortic insufficiency    mild to moderate by echo 12/2020   Atrial flutter ([place]) 12/30/2020   Cataracts, bilateral 09/23/2015   Clostridium difficile infection 04/16/2017   H/o C.diff due to clindamicyin 09/2016; two rounds of treatment for cure. Dr. 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 ([place]) 05/02/2021   Nonrheumatic aortic valve insufficiency 04/07/2021   Obstructive sleep apnea 03/28/2021   Aortic insufficiency 01/18/2021   Atrial flutter ([place]) 12/30/2020   Positive ANA (antinuclear antibody) 10/26/2020   Chondromalacia, knee, right 11/05/2017   Osteoarthrosis, localized, primary, knee, right 11/05/2017   Major depression, recurrent, chronic ([place]) 04/16/2017   Mixed  hyperlipidemia 04/16/2017   Clostridium difficile infection 04/16/2017   Family history of early CAD 04/16/2017   Bilateral sensorineural hearing loss 02/06/2016   Postablative hypothyroidism 12/20/2015   History of Graves' disease 03/22/2015   OAB (overactive bladder) 12/16/2013   GERD (gastroesophageal reflux disease) 12/13/2012   Osteoarthritis of CMC joint of thumb 08/13/2012   Seasonal allergies 08/13/2012   Urge and stress incontinence 08/13/2012   Osteopenia 08/05/2012   DDD (degenerative disc disease), lumbosacral 10/22/2010    PCP: Leamon Arnt, MD  REFERRING PROVIDER: [name], MD   REFERRING DIAG: [phone] (ICD-10-CM) - Right hip pain  THERAPY DIAG:  Right hip pain  Muscle weakness (generalized)  Difficulty in walking, not elsewhere classified  ONSET DATE: 03/04/21  SUBJECTIVE:   SUBJECTIVE STATEMENT Pt 15 min late to appt today. States she is doing much better. Has been wearing heel lift with good comfort. She reports having less pain overall in back, and has been doing HEP. She has increased soreness at times with longer duration standing and walking, but is able to sit and rest for a few minutes and pain is relieved.   PERTINENT HISTORY: Osteopenia, depression, atrial flutter, chronic right low back pain - sees chiro regularly.   PAIN:  Are you having pain? Yes VAS scale: 0- 2 /10 Pain location: hip Pain orientation: Right  PAIN TYPE: aching Pain description:  intermittent  Aggravating factors: stairs, activity Relieving factors: rest  PRECAUTIONS: None  WEIGHT BEARING RESTRICTIONS No  FALLS:  Has patient fallen in last 6 months? Yes, Number of falls: 1  LIVING ENVIRONMENT: Lives in: House/apartment Stairs: Yes; External: 2 steps; on right going up Has following equipment at home: Single point cane and Walker - 2 wheeled  OCCUPATION: retired  PLOF: Independent with basic ADLs  PATIENT GOALS to work on balance   OBJECTIVE:     COGNITION:  Overall cognitive status: Within functional limits for tasks assessed    LE MMT:  MMT Right 05/03/21 Left 05/03/21 R 06/16/21 L 06/16/21  Hip flexion 3+ 4- 4 4+  Hip extension      Hip abduction 4- 4- 4 4+  Hip adduction      Hip internal rotation      Hip external rotation      Knee flexion      Knee extension      Ankle dorsiflexion      Ankle plantarflexion      Ankle inversion      Ankle eversion       (Blank rows = not tested)    FUNCTIONAL TESTS:  BERG 05/03/21:  55/56      TODAY'S TREATMENT: 06/16/21: Therapeutic Exercise: Aerobic:  Supine:   Seated:   S/L:  clams x 20 on R;  Standing:   Marching x 20,  hip abd 2x 10 bil ;  Mini squats x 10; bil;  Walk/march 15 ft x 4;  Neuromuscular Re-education:   ambulation 35 ft x 8, improved posture and mechanics with heel lift.  Manual Therapy:  Self Care: Education on obtaining permanent heel lift, education on final HEP     PATIENT EDUCATION:  PATIENT EDUCATION:  Education details: updated and reviewed final HEP.  Person educated: Patient Education method: Explanation, Demonstration, and Handouts Education comprehension: verbalized understanding   HOME EXERCISE PROGRAM: [id number]  ASSESSMENT:  CLINICAL IMPRESSION: 06/16/21: Pt with improved posture symmetry with back and hip with addition of heel lift. Pt will obtain permanent one. She has improved gait mechanics as well, with less trunk sway, and improved hip stability on R. Pt with some weakness in R hip vs L with strength testing today, but overall improved. Pt will continue to work on hip strength with HEP. Final HEP reviewed in detail today, and its importance. She is doing well at this time, goals met, is ready for d/c to HEP. Pt in agreement with plan.    Objective impairments include decreased balance, decreased cognition, decreased mobility, difficulty walking, decreased ROM, decreased strength, and pain. These impairments are limiting  patient from community activity and stairs . Personal factors including Age and Fitness are also affecting patient's functional outcome. Patient will benefit from skilled PT to address above impairments and improve overall function.   REHAB POTENTIAL: Good  CLINICAL DECISION MAKING: Stable/uncomplicated  EVALUATION COMPLEXITY: Low   GOALS: Goals reviewed with patient? Yes  SHORT TERM GOALS:  STG Name Target Date Goal status  1 Patient will be independent in self management strategies to improve quality of life and functional outcomes. Baseline:  05/31/21 MET  2 Patient will report at least 50% improvement in overall symptoms and/or function to demonstrate improved functional mobility Baseline: 0% 05/31/21 MET  $Re'3     4     5     6     7      'LuG$ LONG TERM GOALS:   LTG Name  Target Date Goal status  1 Patient will report at least 75% improvement in overall symptoms and/or function to demonstrate improved functional mobility Baseline:0% 06/28/21 MET  2 Patient will report walking daily to improve overall function and endurance. Baseline:not currently performing walking program 06/28/21 MET  3 Patient will be able to stand on either leg for 10 seconds to demonstrate improved static balance Baseline: < 10 seconds 06/28/21 Partially met  4  Baseline:    5  Baseline:    6  Baseline:    7  Baseline:       PLAN: PT FREQUENCY: 2x/week  PT DURATION: 8 weeks  PLANNED INTERVENTIONS: Therapeutic exercises, Therapeutic activity, Neuro Muscular re-education, Balance training, Gait training, Patient/Family education, Joint mobilization, Aquatic Therapy, Dry Needling, Electrical stimulation, Spinal mobilization, Cryotherapy, Moist heat, Traction, Ultrasound, and Manual therapy  PLAN FOR NEXT SESSION:   Lyndee Hensen, PT, DPT 12:10 PM  06/16/21   PHYSICAL THERAPY DISCHARGE SUMMARY  Visits from Start of Care: 8   Plan: Patient agrees to discharge.  Patient goals were met. Patient is  being discharged due to meeting the stated rehab goals.     Lyndee Hensen, PT, DPT 12:51 PM  06/16/21

## 2021-06-21 ENCOUNTER — Encounter: Payer: Medicare Other | Admitting: Physical Therapy

## 2021-06-27 ENCOUNTER — Encounter: Payer: Self-pay | Admitting: Cardiology

## 2021-07-04 ENCOUNTER — Encounter: Payer: Self-pay | Admitting: Cardiology

## 2021-07-04 ENCOUNTER — Ambulatory Visit: Payer: Medicare Other | Admitting: Cardiology

## 2021-07-04 ENCOUNTER — Other Ambulatory Visit: Payer: Self-pay

## 2021-07-04 VITALS — BP 136/64 | HR 68 | Ht 60.0 in | Wt 177.2 lb

## 2021-07-04 DIAGNOSIS — E782 Mixed hyperlipidemia: Secondary | ICD-10-CM

## 2021-07-04 DIAGNOSIS — E669 Obesity, unspecified: Secondary | ICD-10-CM | POA: Insufficient documentation

## 2021-07-04 DIAGNOSIS — I483 Typical atrial flutter: Secondary | ICD-10-CM

## 2021-07-04 DIAGNOSIS — I10 Essential (primary) hypertension: Secondary | ICD-10-CM | POA: Insufficient documentation

## 2021-07-04 NOTE — Telephone Encounter (Signed)
Patient saw Dr. Harriet Masson today and reported that she cannot tolerate her CPAP. She has arthritis in her neck and the mask is not working for her. She is requesting a new one.  ?

## 2021-07-04 NOTE — Progress Notes (Signed)
Cardiology Office Note:    Date:  07/04/2021   ID:  Martha Gilmore, DOB [birth year], MRN [id number]  PCP:  Leamon Arnt, MD  Cardiologist:  Berniece Salines, DO  Electrophysiologist:  None   Referring MD: Leamon Arnt, MD   " I am doing fine"  History of Present Illness:    Martha Gilmore is a 81 y.o. female with a hx of atrial flutter status post ablation on Eliquis, aortic insufficiency, hypertension, hyperlipidemia, OSA here today for follow-up visit.  Since her cardioversion she has been doing well.  She has seen Dr. Earlyne Iba and has been discussing possibility of ablation.  She tells me that she is doing well since her cardioversion.  Today is her birthday she is excited that she is going to be going to lunch with both her daughters and her granddaughter.  Since I saw her she has started on CPAP machine.  But she is not tolerating this.  She is requesting another type of device.  Past Medical History:  Diagnosis Date   Aortic insufficiency    mild to moderate by echo 12/2020   Atrial flutter ([place]) 12/30/2020   Cataracts, bilateral 09/23/2015   Clostridium difficile infection 04/16/2017   H/o C.diff due to clindamicyin 09/2016; two rounds of treatment for cure. Dr. Benson Norway   Depression    Hyperlipidemia    Hypertension    OAB (overactive bladder) 12/16/2013   Osteopenia 08/05/2012   Dexa scan 07/2012 mild osteopenia, T = -1.1   Seasonal allergies    Urge and stress incontinence     Past Surgical History:  Procedure Laterality Date   APPENDECTOMY  1971   CARDIOVERSION N/A 04/13/2021   Procedure: CARDIOVERSION;  Surgeon: Werner Lean, MD;  Location: [facility];  Service: Cardiovascular;  Laterality: N/A;   CESAREAN SECTION     CHOLECYSTECTOMY     Teeth Implants     TRIGGER FINGER RELEASE     VAGINAL HYSTERECTOMY      Current Medications: Current Meds  Medication Sig   apixaban (ELIQUIS) 5 MG TABS tablet Take 1 tablet (5 mg total) by mouth 2 (two)  times daily.   atorvastatin (LIPITOR) 10 MG tablet TAKE 1 TABLET BY MOUTH EVERYDAY AT BEDTIME   citalopram (CELEXA) 20 MG tablet Take 1 tablet (20 mg total) by mouth daily.   diltiazem (CARDIZEM CD) 180 MG 24 hr capsule Take 1 capsule (180 mg total) by mouth daily.   gabapentin (NEURONTIN) 600 MG tablet Take 0.5 tablets (300 mg total) by mouth at bedtime.   levothyroxine (SYNTHROID) 100 MCG tablet Take 1 tablet (100 mcg total) by mouth daily.   Multiple Vitamins-Minerals (PRESERVISION AREDS PO) Take 1 capsule by mouth in the morning and at bedtime.   omeprazole (PRILOSEC) 20 MG capsule TAKE 1 CAPSULE BY MOUTH EVERY DAY   oxybutynin (DITROPAN-XL) 10 MG 24 hr tablet Take 1 tablet (10 mg total) by mouth at bedtime.   Polyethyl Glycol-Propyl Glycol (SYSTANE OP) Place 1 drop into both eyes daily as needed (DRY EYES).   traMADol (ULTRAM) 50 MG tablet Take 1 tablet (50 mg total) by mouth 3 (three) times daily as needed.     Allergies:   Aspirin, Meperidine, Penicillins, Statins, Cephalexin, Clindamycin/lincomycin, Demerol  [meperidine hcl], and Tape   Social History   Socioeconomic History   Marital status: Widowed    Spouse name: Not on file   Number of children: Not on file   Years of education: Not on file  Highest education level: Not on file  Occupational History   Not on file  Tobacco Use   Smoking status: Never   Smokeless tobacco: Never  Vaping Use   Vaping Use: Never used  Substance and Sexual Activity   Alcohol use: No   Drug use: No   Sexual activity: Never    Partners: Male  Other Topics Concern   Not on file  Social History Narrative   Not on file   Social Determinants of Health   Financial Resource Strain: Low Risk    Difficulty of Paying Living Expenses: Not hard at all  Food Insecurity: No Food Insecurity   Worried About Charity fundraiser in the Last Year: Never true   [place] in the Last Year: Never true  Transportation Needs: No Transportation  Needs   Lack of Transportation (Medical): No   Lack of Transportation (Non-Medical): No  Physical Activity: Inactive   Days of Exercise per Week: 0 days   Minutes of Exercise per Session: 0 min  Stress: No Stress Concern Present   Feeling of Stress : Not at all  Social Connections: Moderately Integrated   Frequency of Communication with Friends and Family: Once a week   Frequency of Social Gatherings with Friends and Family: More than three times a week   Attends Religious Services: More than 4 times per year   Active Member of [employer] or Organizations: Yes   Attends Archivist Meetings: 1 to 4 times per year   Marital Status: Widowed     Family History: The patient's family history includes Brain cancer in her brother; Cancer in her father; Diabetes in her sister; Heart disease in her father, mother, and sister; Hypertension in her mother; Kidney cancer in her sister; Lung cancer in her sister.  ROS:   Review of Systems  Constitution: Negative for decreased appetite, fever and weight gain.  HENT: Negative for congestion, ear discharge, hoarse voice and sore throat.   Eyes: Negative for discharge, redness, vision loss in right eye and visual halos.  Cardiovascular: Negative for chest pain, dyspnea on exertion, leg swelling, orthopnea and palpitations.  Respiratory: Negative for cough, hemoptysis, shortness of breath and snoring.   Endocrine: Negative for heat intolerance and polyphagia.  Hematologic/Lymphatic: Negative for bleeding problem. Does not bruise/bleed easily.  Skin: Negative for flushing, nail changes, rash and suspicious lesions.  Musculoskeletal: Negative for arthritis, joint pain, muscle cramps, myalgias, neck pain and stiffness.  Gastrointestinal: Negative for abdominal pain, bowel incontinence, diarrhea and excessive appetite.  Genitourinary: Negative for decreased libido, genital sores and incomplete emptying.  Neurological: Negative for brief paralysis,  focal weakness, headaches and loss of balance.  Psychiatric/Behavioral: Negative for altered mental status, depression and suicidal ideas.  Allergic/Immunologic: Negative for HIV exposure and persistent infections.    EKGs/Labs/Other Studies Reviewed:    The following studies were reviewed today:   EKG:  The ekg ordered today demonstrates   Zio monitor  Predominant rhythm was atrial flutter with average heart rate 92bpm and ranged from 48 to 172bpm. No sinus rhythm was present. Occasional PVCs were present.   TTE 01/18/2021 IMPRESSIONS   1. The aortic valve is calcified. Aortic valve regurgitation is mild to moderate. Mild to moderate aortic valve sclerosis/calcification is present, without any evidence of aortic stenosis and stroke volume index  of 38.   2. Left ventricular ejection fraction, by estimation, is 60 to 65%. The  left ventricle has normal function. The left ventricle has  no regional  wall motion abnormalities. Left ventricular diastolic parameters are  indeterminate due to atrial flutter.   3. Right ventricular systolic function is normal. The right ventricular  size is normal. There is normal pulmonary artery systolic pressure. The  estimated right ventricular systolic pressure is 52.7 mmHg.   4. Left atrial size was mildly dilated.   5. The mitral valve is abnormal. Mild mitral valve regurgitation. The  mean mitral valve gradient is 2.0 mmHg with average heart rate of 103 bpm.   6. The inferior vena cava is normal in size with greater than 50%  respiratory variability, suggesting right atrial pressure of 3 mmHg.   Comparison(s): No prior Echocardiogram.   FINDINGS   Left Ventricle: Left ventricular ejection fraction, by estimation, is 60  to 65%. The left ventricle has normal function. The left ventricle has no  regional wall motion abnormalities. The left ventricular internal cavity  size was normal in size. There is   no left ventricular hypertrophy. Left  ventricular diastolic parameters  are indeterminate.   Right Ventricle: The right ventricular size is normal. No increase in  right ventricular wall thickness. Right ventricular systolic function is  normal. There is normal pulmonary artery systolic pressure. The tricuspid  regurgitant velocity is 1.94 m/s, and   with an assumed right atrial pressure of 3 mmHg, the estimated right  ventricular systolic pressure is 78.2 mmHg.   Left Atrium: Left atrial size was mildly dilated.   Right Atrium: Right atrial size was normal in size.   Pericardium: There is no evidence of pericardial effusion.   Mitral Valve: The mitral valve is abnormal. Mild mitral valve  regurgitation. The mean mitral valve gradient is 2.0 mmHg with average  heart rate of 103 bpm.   Tricuspid Valve: The tricuspid valve is grossly normal. Tricuspid valve  regurgitation is mild . No evidence of tricuspid stenosis.   Aortic Valve: The aortic valve is calcified. Aortic valve regurgitation is  mild to moderate. Mild to moderate aortic valve sclerosis/calcification is  present, without any evidence of aortic stenosis. Aortic valve mean  gradient measures 11.0 mmHg. Aortic  valve peak gradient measures 19.4 mmHg. Aortic valve area, by VTI measures  1.88 cm.   Pulmonic Valve: The pulmonic valve was grossly normal. Pulmonic valve  regurgitation is mild.   Aorta: The aortic root and ascending aorta are structurally normal, with  no evidence of dilitation.   Pulmonary Artery: The pulmonary artery is of normal size.   Venous: The inferior vena cava is normal in size with greater than 50%  respiratory variability, suggesting right atrial pressure of 3 mmHg.   IAS/Shunts: The atrial septum is grossly normal.       Recent Labs: 04/04/2021: Magnesium 2.1 05/02/2021: ALT 13; BUN 21; Creatinine, Ser 0.94; Hemoglobin 13.2; Platelets 272.0; Potassium 4.7; Sodium 139; TSH 6.68  Recent Lipid Panel    Component Value  Date/Time   CHOL 200 05/02/2021 0901   TRIG 57.0 05/02/2021 0901   HDL 67.20 05/02/2021 0901   CHOLHDL 3 05/02/2021 0901   VLDL 11.4 05/02/2021 0901   LDLCALC 122 (H) 05/02/2021 0901    Physical Exam:    VS:  BP 136/64    Pulse 68    Ht 5' (1.524 m)    Wt 177 lb 3.2 oz (80.4 kg)    SpO2 98%    BMI 34.61 kg/m     Wt Readings from Last 3 Encounters:  07/04/21 177 lb 3.2 oz (80.4 kg)  05/06/21 170 lb (77.1 kg)  05/02/21 171 lb 9.6 oz (77.8 kg)     GEN: Well nourished, well developed in no acute distress HEENT: Normal NECK: No JVD; No carotid bruits LYMPHATICS: No lymphadenopathy CARDIAC: S1S2 noted,RRR, no murmurs, rubs, gallops RESPIRATORY:  Clear to auscultation without rales, wheezing or rhonchi  ABDOMEN: Soft, non-tender, non-distended, +bowel sounds, no guarding. EXTREMITIES: No edema, No cyanosis, no clubbing MUSCULOSKELETAL:  No deformity  SKIN: Warm and dry NEUROLOGIC:  Alert and oriented x 3, non-focal PSYCHIATRIC:  Normal affect, good insight  ASSESSMENT:    1. Typical atrial flutter ([place])   2. Primary hypertension   3. Mixed hyperlipidemia   4. Obesity (BMI 30-39.9)    PLAN:     Typical atrial flutter-on Cardizem as well as Eliquis.  We will continue this.  She is status post cardioversion.  She has been following with Dr. Curt Bears recommendation for ablation.  I discussed with the patient and answer some of her questions that she had.  Hypertension-blood pressure is acceptable, continue with current antihypertensive regimen.  Hyperlipidemia-hyperlipidemia - continue with current statin medication.  The patient understands the need to lose weight with diet and exercise. We have discussed specific strategies for this.  The patient is in agreement with the above plan. The patient left the office in stable condition.  The patient will follow up in 6 months or sooner if needed.   Medication Adjustments/Labs and Tests Ordered: Current medicines are reviewed at  length with the patient today.  Concerns regarding medicines are outlined above.  No orders of the defined types were placed in this encounter.  No orders of the defined types were placed in this encounter.   Patient Instructions  Medication Instructions:  Your physician recommends that you continue on your current medications as directed. Please refer to the Current Medication list given to you today.  *If you need a refill on your cardiac medications before your next appointment, please call your pharmacy*   Lab Work: None If you have labs (blood work) drawn today and your tests are completely normal, you will receive your results only by: [place] (if you have MyChart) OR A paper copy in the mail If you have any lab test that is abnormal or we need to change your treatment, we will call you to review the results.   Testing/Procedures: None   Follow-Up: At [facility], you and your health needs are our priority.  As part of our continuing mission to provide you with exceptional heart care, we have created designated Provider Care Teams.  These Care Teams include your primary Cardiologist (physician) and Advanced Practice Providers (APPs -  Physician Assistants and Nurse Practitioners) who all work together to provide you with the care you need, when you need it.  We recommend signing up for the patient portal called "MyChart".  Sign up information is provided on this After Visit Summary.  MyChart is used to connect with patients for Virtual Visits (Telemedicine).  Patients are able to view lab/test results, encounter notes, upcoming appointments, etc.  Non-urgent messages can be sent to your provider as well.   To learn more about what you can do with MyChart, go to [email or web address].    Your next appointment:   6 month(s)  The format for your next appointment:   In Person  Provider:   Berniece Salines, DO     Other Instructions     Adopting a Healthy  Lifestyle.  Know what a healthy weight  is for you (roughly BMI <25) and aim to maintain this   Aim for 7+ servings of fruits and vegetables daily   65-80+ fluid ounces of water or unsweet tea for healthy kidneys   Limit to max 1 drink of alcohol per day; avoid smoking/tobacco   Limit animal fats in diet for cholesterol and heart health - choose grass fed whenever available   Avoid highly processed foods, and foods high in saturated/trans fats   Aim for low stress - take time to unwind and care for your mental health   Aim for 150 min of moderate intensity exercise weekly for heart health, and weights twice weekly for bone health   Aim for 7-9 hours of sleep daily   When it comes to diets, agreement about the perfect plan isnt easy to find, even among the experts. Experts at the [employer] developed an idea known as the Healthy Eating Plate. Just imagine a plate divided into logical, healthy portions.   The emphasis is on diet quality:   Load up on vegetables and fruits - one-half of your plate: Aim for color and variety, and remember that potatoes dont count.   Go for whole grains - one-quarter of your plate: Whole wheat, barley, wheat berries, quinoa, oats, brown rice, and foods made with them. If you want pasta, go with whole wheat pasta.   Protein power - one-quarter of your plate: Fish, chicken, beans, and nuts are all healthy, versatile protein sources. Limit red meat.   The diet, however, does go beyond the plate, offering a few other suggestions.   Use healthy plant oils, such as olive, canola, soy, corn, sunflower and peanut. Check the labels, and avoid partially hydrogenated oil, which have unhealthy trans fats.   If youre thirsty, drink water. Coffee and tea are good in moderation, but skip sugary drinks and limit milk and dairy products to one or two daily servings.   The type of carbohydrate in the diet is more important than the amount. Some  sources of carbohydrates, such as vegetables, fruits, whole grains, and beans-are healthier than others.   Finally, stay active  Signed, Berniece Salines, DO  07/04/2021 9:37 AM    [facility]

## 2021-07-04 NOTE — Patient Instructions (Signed)

## 2021-07-07 ENCOUNTER — Telehealth: Payer: Self-pay | Admitting: *Deleted

## 2021-07-07 NOTE — Telephone Encounter (Signed)
Please set up an office appointment with her DME for possible other masks  ?

## 2021-07-07 NOTE — Telephone Encounter (Signed)
Order placed to [facility] to set up an office appointment with her DME for possible other masks. ?

## 2021-07-15 ENCOUNTER — Encounter: Payer: Self-pay | Admitting: Cardiology

## 2021-07-15 ENCOUNTER — Encounter: Payer: Self-pay | Admitting: Family Medicine

## 2021-07-18 ENCOUNTER — Other Ambulatory Visit: Payer: Self-pay | Admitting: Family Medicine

## 2021-07-18 DIAGNOSIS — N3281 Overactive bladder: Secondary | ICD-10-CM

## 2021-07-18 MED ORDER — TRAMADOL HCL 50 MG PO TABS: 50.0000 mg | ORAL_TABLET | Freq: Three times a day (TID) | ORAL | 2 refills | Status: DC | PRN

## 2021-07-19 NOTE — Telephone Encounter (Signed)
Per patient request ,Order place for a chin strap to [employer] [facility]. ?

## 2021-07-19 NOTE — Addendum Note (Signed)
Addended by: Freada Bergeron on: 07/19/2021 11:57 AM ? ? Modules accepted: Orders ? ?

## 2021-07-24 ENCOUNTER — Encounter: Payer: Self-pay | Admitting: Cardiology

## 2021-07-24 DIAGNOSIS — G4733 Obstructive sleep apnea (adult) (pediatric): Secondary | ICD-10-CM

## 2021-07-26 ENCOUNTER — Telehealth: Payer: Self-pay | Admitting: *Deleted

## 2021-07-26 ENCOUNTER — Encounter: Payer: Self-pay | Admitting: *Deleted

## 2021-07-26 DIAGNOSIS — I483 Typical atrial flutter: Secondary | ICD-10-CM

## 2021-07-26 DIAGNOSIS — Z01812 Encounter for preprocedural laboratory examination: Secondary | ICD-10-CM

## 2021-07-26 NOTE — Telephone Encounter (Signed)
Called pt to review upcoming ablation instructions. ?She reports her dtr has recently been diagnosed with breast cancer and she needs to be with her and help w/ things -- she would like to move her procedure to June. ?Scheduling for 6/23. ?Will send instructions via mychart and we will discuss scheduling blood work via [employer]. ?Pt appreciates our understanding  on this matter. ? ?

## 2021-08-03 ENCOUNTER — Other Ambulatory Visit: Payer: Self-pay

## 2021-08-03 MED ORDER — DILTIAZEM HCL ER COATED BEADS 180 MG PO CP24: 180.0000 mg | ORAL_CAPSULE | Freq: Every day | ORAL | 3 refills | Status: DC

## 2021-08-18 NOTE — Telephone Encounter (Signed)
Forward to sleep coordinator  ?

## 2021-08-19 ENCOUNTER — Ambulatory Visit: Payer: Medicare Other | Admitting: Family Medicine

## 2021-08-19 ENCOUNTER — Encounter: Payer: Self-pay | Admitting: Family Medicine

## 2021-08-19 VITALS — BP 146/90 | HR 68 | Temp 97.6°F | Ht 60.0 in | Wt 177.4 lb

## 2021-08-19 DIAGNOSIS — M25572 Pain in left ankle and joints of left foot: Secondary | ICD-10-CM

## 2021-08-19 DIAGNOSIS — I10 Essential (primary) hypertension: Secondary | ICD-10-CM

## 2021-08-19 DIAGNOSIS — M5137 Other intervertebral disc degeneration, lumbosacral region: Secondary | ICD-10-CM | POA: Diagnosis not present

## 2021-08-19 DIAGNOSIS — M45A1 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: Secondary | ICD-10-CM | POA: Diagnosis not present

## 2021-08-19 DIAGNOSIS — G4733 Obstructive sleep apnea (adult) (pediatric): Secondary | ICD-10-CM

## 2021-08-19 DIAGNOSIS — F339 Major depressive disorder, recurrent, unspecified: Secondary | ICD-10-CM

## 2021-08-19 DIAGNOSIS — M25571 Pain in right ankle and joints of right foot: Secondary | ICD-10-CM

## 2021-08-19 DIAGNOSIS — Z7901 Long term (current) use of anticoagulants: Secondary | ICD-10-CM | POA: Insufficient documentation

## 2021-08-19 DIAGNOSIS — I483 Typical atrial flutter: Secondary | ICD-10-CM | POA: Diagnosis not present

## 2021-08-19 MED ORDER — LIDOCAINE 5 % EX PTCH: 1.0000 | MEDICATED_PATCH | CUTANEOUS | 1 refills | Status: DC

## 2021-08-19 MED ORDER — BUPROPION HCL ER (XL) 150 MG PO TB24: 150.0000 mg | ORAL_TABLET | Freq: Every day | ORAL | 3 refills | Status: DC

## 2021-08-19 NOTE — Progress Notes (Signed)
? ?Subjective  ?CC:  ?Chief Complaint  ?Patient presents with  ? Neck Pain  ?  Pt stated that her neck pain is not getting no better and would like a different medication or a referral. Pain level 7. Also feel like her ankles are swelling.  ? ? ?HPI: Martha Gilmore is a 81 y.o. female who presents to the office today to address the problems listed above in the chief complaint. ?81 year old female with history of depression, atrial flutter s/p cardioversion, aortic insufficiency, hyperlipidemia, and osteoarthritis presents due to a multitude of symptoms and complaints.  She feels quite overwhelmed.  Her neck pain continues to be daily and only partially controlled with gabapentin at night and tramadol 3 times daily.  She has been managed by sports medicine and neurosurgery, failed conservative therapies including Cymbalta, Tylenol, steroid injections.  She has had MRI.  She has no new symptoms.  She also is being seen by cardiology for atrial flutter considering ablation.  She is scheduled for May.  She is very worried about her heart.  She worries about heart disease.  Also recently diagnosed with sleep apnea and not tolerating CPAP at all.  The strap tends to cause her neck pain to be worse and she does not feel any more rested.  Her mood is low, motivation is low, she is sleeping more.  She has constant worry about her health at this time.  And finally, she went to [place] and walked for several days in a row and now has bilateral ankle pain.  No injuries ?She denies exertional chest pain, exertional shortness of breath.  No GI symptoms.  No adverse effects from tramadol or gabapentin.  She remains on Lexapro 20 mg daily. ?Hypertension: Has been well controlled although elevated today.  This is in part due to stress and neck pain.  She remains on Cardizem for rate control and blood pressure. ?She remains on Eliquis twice daily for CHADS2 score of 3. ? ?Assessment  ?1. 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 DDD (degenerative disc disease), lumbosacral   ?3. Primary hypertension   ?4. Typical atrial flutter ([place])   ?5. Obstructive sleep apnea   ?6. Acute bilateral ankle pain   ?7. Current use of long term anticoagulation   ?8. Major depression, recurrent, chronic ([place])   ? ?  ?Plan  ?Osteoarthritis neck back and likely ankles: Had long discussion about care.  Unfortunately her neck pain is very debilitating.  She will follow-up with neurosurgery 1 last time to ensure there is no other options they can offer.  She will continue with tramadol.  We we will try a Lidoderm patch.  Continue gabapentin at night.  If not improving, would have to consider escalating pain medication to hydrocodone or Tylenol 3. ?Depression: Currently active.  Lots of anxiety and worry.  Add Wellbutrin 150 daily to Lexapro 20 daily ?Atrial flutter and hypertension: Managed by cardiology.  Will monitor blood pressure.  Suspect elevated blood pressure response due to stress and pain today.  has ablation scheduled for May ? ?I spent a total of 42 minutes for this patient encounter. Time spent included preparation, face-to-face counseling with the patient and coordination of care, review of chart and records, and documentation of the encounter. ? ? ?Follow up: No follow-ups on file.  ?10/17/2021 ? ?No orders of the defined types were placed in this encounter. ? ?Meds ordered this encounter  ?Medications  ? buPROPion (WELLBUTRIN XL) 150 MG 24  hr tablet  ?  Sig: Take 1 tablet (150 mg total) by mouth daily.  ?  Dispense:  90 tablet  ?  Refill:  3  ? lidocaine (LIDODERM) 5 %  ?  Sig: Place 1 patch onto the skin daily. Remove & Discard patch within 12 hours or as directed by MD  ?  Dispense:  30 patch  ?  Refill:  1  ? ?  ? ?I reviewed the patients updated PMH, FH, and SocHx.  ?  ?Patient Active Problem List  ? Diagnosis Date Noted  ? Obstructive sleep apnea 03/28/2021  ?  Priority: High  ?  Atrial flutter ([place]) 12/30/2020  ?  Priority: High  ? Postablative hypothyroidism 12/20/2015  ?  Priority: High  ? Chondromalacia, knee, right 11/05/2017  ?  Priority: Medium   ? Osteoarthrosis, localized, primary, knee, right 11/05/2017  ?  Priority: Medium   ? Major depression, recurrent, chronic ([name]) 04/16/2017  ?  Priority: Medium   ? Mixed hyperlipidemia 04/16/2017  ?  Priority: Medium   ? History of Graves' disease 03/22/2015  ?  Priority: Medium   ? GERD (gastroesophageal reflux disease) 12/13/2012  ?  Priority: Medium   ? Osteopenia 08/05/2012  ?  Priority: Medium   ? DDD (degenerative disc disease), lumbosacral 10/22/2010  ?  Priority: Medium   ? Nonrheumatic aortic valve insufficiency 04/07/2021  ?  Priority: Low  ? Bilateral sensorineural hearing loss 02/06/2016  ?  Priority: Low  ? OAB (overactive bladder) 12/16/2013  ?  Priority: Low  ? Osteoarthritis of [place] joint of thumb 08/13/2012  ?  Priority: Low  ? Seasonal allergies 08/13/2012  ?  Priority: Low  ? Urge and stress incontinence 08/13/2012  ?  Priority: Low  ? Current use of long term anticoagulation 08/19/2021  ? Primary hypertension 07/04/2021  ? Obesity (BMI 30-39.9) 07/04/2021  ? 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 [facility]) 05/02/2021  ? Aortic insufficiency 01/18/2021  ? Positive ANA (antinuclear antibody) 10/26/2020  ? Clostridium difficile infection 04/16/2017  ? Family history of early CAD 04/16/2017  ? ?Current Meds  ?Medication Sig  ? apixaban (ELIQUIS) 5 MG TABS tablet Take 1 tablet (5 mg total) by mouth 2 (two) times daily.  ? atorvastatin (LIPITOR) 10 MG tablet TAKE 1 TABLET BY MOUTH EVERYDAY AT BEDTIME  ? buPROPion (WELLBUTRIN XL) 150 MG 24 hr tablet Take 1 tablet (150 mg total) by mouth daily.  ? citalopram (CELEXA) 20 MG tablet Take 1 tablet (20 mg total) by mouth daily.  ? diltiazem (CARDIZEM CD) 180 MG 24 hr capsule Take 1 capsule (180 mg total) by mouth daily.  ? gabapentin (NEURONTIN) 600 MG  tablet Take 0.5 tablets (300 mg total) by mouth at bedtime.  ? levothyroxine (SYNTHROID) 100 MCG tablet Take 1 tablet (100 mcg total) by mouth daily.  ? lidocaine (LIDODERM) 5 % Place 1 patch onto the skin daily. Remove & Discard patch within 12 hours or as directed by MD  ? Multiple Vitamins-Minerals (PRESERVISION AREDS PO) Take 1 capsule by mouth in the morning and at bedtime.  ? omeprazole (PRILOSEC) 20 MG capsule TAKE 1 CAPSULE BY MOUTH EVERY DAY  ? oxybutynin (DITROPAN-XL) 10 MG 24 hr tablet TAKE 1 TABLET BY MOUTH AT  BEDTIME  ? Polyethyl Glycol-Propyl Glycol (SYSTANE OP) Place 1 drop into both eyes daily as needed (DRY EYES).  ? traMADol (ULTRAM) 50 MG tablet Take 1 tablet (50 mg total) by mouth 3 (three) times daily as needed.  ? ? ?  Allergies: ?Patient is allergic to aspirin, meperidine, penicillins, statins, cephalexin, clindamycin/lincomycin, demerol  [meperidine hcl], and tape. ?Family History: ?Patient family history includes Brain cancer in her brother; Cancer in her father; Diabetes in her sister; Heart disease in her father, mother, and sister; Hypertension in her mother; Kidney cancer in her sister; Lung cancer in her sister. ?Social History:  ?Patient  reports that she has never smoked. She has never used smokeless tobacco. She reports that she does not drink alcohol and does not use drugs. ? ?Review of Systems: ?Constitutional: Negative for fever malaise or anorexia ?Cardiovascular: negative for chest pain ?Respiratory: negative for SOB or persistent cough ?Gastrointestinal: negative for abdominal pain ? ?Objective  ?Vitals: BP (!) 146/90 Comment: Rt arm  Pulse 68   Temp 97.6 ?F (36.4 ?C)   Ht 5' (1.524 m)   Wt 177 lb 6.4 oz (80.5 kg)   SpO2 98%   BMI 34.65 kg/m?  ?General: no acute distress , A&Ox3 ?HEENT: Neck: Limited range of motion ?Cardiovascular:  RRR without murmur or gallop.  ?Respiratory:  Good breath sounds bilaterally, CTAB with normal respiratory effort ?Skin:  Warm, no  rashes ?Lower extremities without edema ? ? ? ?Commons side effects, risks, benefits, and alternatives for medications and treatment plan prescribed today were discussed, and the patient expressed understanding of the given instructions.

## 2021-08-19 NOTE — Patient Instructions (Signed)
Please follow up as scheduled for your next visit with me: 10/17/2021  ? ?Start the 2nd antidepressant daily.  ?Try the lidoderm patches to the neck area to see if that helps with the pain.  ? ?If you have any questions or concerns, please don't hesitate to send me a message via MyChart or call the office at [phone]. Thank you for visiting with [place] today! It's our pleasure caring for you.  ?

## 2021-08-25 ENCOUNTER — Telehealth: Payer: Self-pay

## 2021-08-25 ENCOUNTER — Encounter: Payer: Self-pay | Admitting: Cardiology

## 2021-08-25 NOTE — Telephone Encounter (Signed)
Lidocaine patches has been denied by insurance. ? ?Please Advise ? ?

## 2021-08-26 NOTE — Telephone Encounter (Signed)
LVM for pt to let her know that insurance denied the lidocaine patches. Advised the pt to contact carrier to see if there is an alternative that they would cover and let us know. ?

## 2021-09-02 ENCOUNTER — Telehealth (INDEPENDENT_AMBULATORY_CARE_PROVIDER_SITE_OTHER): Payer: Medicare Other | Admitting: Cardiology

## 2021-09-02 VITALS — BP 155/78 | HR 69 | Ht 60.0 in | Wt 173.7 lb

## 2021-09-02 DIAGNOSIS — I10 Essential (primary) hypertension: Secondary | ICD-10-CM

## 2021-09-02 DIAGNOSIS — G4733 Obstructive sleep apnea (adult) (pediatric): Secondary | ICD-10-CM

## 2021-09-02 NOTE — Patient Instructions (Signed)
Medication Instructions:  ?Your physician recommends that you continue on your current medications as directed. Please refer to the Current Medication list given to you today. ? ?*If you need a refill on your cardiac medications before your next appointment, please call your pharmacy* ? ?Lab Work: ?If you have labs (blood work) drawn today and your tests are completely normal, you will receive your results only by: ?MyChart Message (if you have MyChart) OR ?A paper copy in the mail ?If you have any lab test that is abnormal or we need to change your treatment, we will call you to review the results. ? ?Follow-Up: ?At [facility], you and your health needs are our priority.  As part of our continuing mission to provide you with exceptional heart care, we have created designated Provider Care Teams.  These Care Teams include your primary Cardiologist (physician) and Advanced Practice Providers (APPs -  Physician Assistants and Nurse Practitioners) who all work together to provide you with the care you need, when you need it. ? ?We recommend signing up for the patient portal called "MyChart".  Sign up information is provided on this After Visit Summary.  MyChart is used to connect with patients for Virtual Visits (Telemedicine).  Patients are able to view lab/test results, encounter notes, upcoming appointments, etc.  Non-urgent messages can be sent to your provider as well.   ?To learn more about what you can do with MyChart, go to [email or web address].   ? ?Your next appointment:   ? 1 year  ? ?The format for your next appointment:   ?In Person ? ?Provider:   ?Dr. Radford Pax ? ? ?Important Information About [place] ? ? ? ? ?  ?

## 2021-09-02 NOTE — Progress Notes (Signed)
? ?Virtual Visit via Telephone Note  ? ?This visit type was conducted due to national recommendations for restrictions regarding the COVID-19 Pandemic (e.g. social distancing) in an effort to limit this patient's exposure and mitigate transmission in our community.  Due to her co-morbid illnesses, this patient is at least at moderate risk for complications without adequate follow up.  This format is felt to be most appropriate for this patient at this time.  All issues noted in this document were discussed and addressed.  A limited physical exam could not be performed due to lack of video support.  Please refer to the patient's chart for her consent to telehealth for [facility]. ? ?Date:  09/02/2021  ? ?ID:  Martha Gilmore, DOB 11-15-1940, MRN [id number] ?The patient was identified using 2 identifiers. ? ?Patient Location: Home ?Provider Location: Home Office ? ? ?PCP:  Leamon Arnt, MD ?  ?[facility] Providers ?Cardiologist:  Berniece Salines, DO    ? ?Evaluation Performed:  Follow-Up Visit ? ?Chief Complaint:  OSA ? ?History of Present Illness:   ? ?Martha Gilmore is a 81 y.o. female with a hist who was referred by Dr. Jorene Minors for sleep study.ory of mild to moderate aortic insufficiency, atrial flutter, hypertension, overactive bladder and hyperlipidemia who was referred by Dr. Harriet Masson for sleep study.  She underwent PSG in November 2022 which revealed moderate obstructive sleep apnea with an AHI of 20.2/h and nocturnal hypoxemia with an O2 sat nadir of 78%.  She underwent CPAP titration to 14 cm H2O.  She is now here for follow-up. ? ?She is doing well with her CPAP device and thinks that she has gotten used to it.  She tolerates the full face mask fairly well and feels the pressure is adequate.  Since going on CPAP she feels rested in the am and has no significant daytime sleepiness. She has problems with mouth dryness at time.  She denies any nasal dryness or nasal congestion.  She does not think that he  snores.    ? ?Past Medical History:  ?Diagnosis Date  ? Aortic insufficiency   ? mild to moderate by echo 12/2020  ? Atrial flutter ([place]) 12/30/2020  ? Cataracts, bilateral 09/23/2015  ? Clostridium difficile infection 04/16/2017  ? H/o C.diff due to clindamicyin 09/2016; two rounds of treatment for cure. Dr. Benson Norway  ? Depression   ? Hyperlipidemia   ? Hypertension   ? OAB (overactive bladder) 12/16/2013  ? Osteopenia 08/05/2012  ? Dexa scan 07/2012 mild osteopenia, T = -1.1  ? Seasonal allergies   ? Urge and stress incontinence   ? ?Past Surgical History:  ?Procedure Laterality Date  ? APPENDECTOMY  1971  ? CARDIOVERSION N/A 04/13/2021  ? Procedure: CARDIOVERSION;  Surgeon: Werner Lean, MD;  Location: [facility];  Service: Cardiovascular;  Laterality: N/A;  ? CESAREAN SECTION    ? CHOLECYSTECTOMY    ? Teeth Implants    ? TRIGGER FINGER RELEASE    ? VAGINAL HYSTERECTOMY    ?  ? ?Current Meds  ?Medication Sig  ? apixaban (ELIQUIS) 5 MG TABS tablet Take 1 tablet (5 mg total) by mouth 2 (two) times daily.  ? atorvastatin (LIPITOR) 10 MG tablet TAKE 1 TABLET BY MOUTH EVERYDAY AT BEDTIME  ? buPROPion (WELLBUTRIN XL) 150 MG 24 hr tablet Take 1 tablet (150 mg total) by mouth daily.  ? citalopram (CELEXA) 20 MG tablet Take 1 tablet (20 mg total) by mouth daily.  ? diltiazem (CARDIZEM CD) 180  MG 24 hr capsule Take 1 capsule (180 mg total) by mouth daily.  ? gabapentin (NEURONTIN) 600 MG tablet Take 0.5 tablets (300 mg total) by mouth at bedtime.  ? levothyroxine (SYNTHROID) 100 MCG tablet Take 1 tablet (100 mcg total) by mouth daily.  ? Multiple Vitamins-Minerals (PRESERVISION AREDS PO) Take 1 capsule by mouth in the morning and at bedtime.  ? omeprazole (PRILOSEC) 20 MG capsule TAKE 1 CAPSULE BY MOUTH EVERY DAY  ? oxybutynin (DITROPAN-XL) 10 MG 24 hr tablet TAKE 1 TABLET BY MOUTH AT  BEDTIME  ? Polyethyl Glycol-Propyl Glycol (SYSTANE OP) Place 1 drop into both eyes daily as needed (DRY EYES).  ? traMADol (ULTRAM)  50 MG tablet Take 1 tablet (50 mg total) by mouth 3 (three) times daily as needed.  ?  ? ?Allergies:   Aspirin, Meperidine, Penicillins, Statins, Cephalexin, Clindamycin/lincomycin, Demerol  [meperidine hcl], and Tape  ? ?Social History  ? ?Tobacco Use  ? Smoking status: Never  ? Smokeless tobacco: Never  ?Vaping Use  ? Vaping Use: Never used  ?Substance Use Topics  ? Alcohol use: No  ? Drug use: No  ?  ? ?Family Hx: ?The patient's family history includes Brain cancer in her brother; Cancer in her father; Diabetes in her sister; Heart disease in her father, mother, and sister; Hypertension in her mother; Kidney cancer in her sister; Lung cancer in her sister. ? ?ROS:   ?Please see the history of present illness.    ? ?All other systems reviewed and are negative. ? ? ?Prior CV studies:   ?The following studies were reviewed today: ? ?PSG and CPAP titration, PAP compliance download ? ?Labs/Other Tests and Data Reviewed:   ? ?EKG:  No ECG reviewed. ? ?Recent Labs: ?04/04/2021: Magnesium 2.1 ?05/02/2021: ALT 13; BUN 21; Creatinine, Ser 0.94; Hemoglobin 13.2; Platelets 272.0; Potassium 4.7; Sodium 139; TSH 6.68  ? ?Recent Lipid Panel ?Lab Results  ?Component Value Date/Time  ? CHOL 200 05/02/2021 09:01 AM  ? TRIG 57.0 05/02/2021 09:01 AM  ? HDL 67.20 05/02/2021 09:01 AM  ? CHOLHDL 3 05/02/2021 09:01 AM  ? LDLCALC 122 (H) 05/02/2021 09:01 AM  ? ? ?Wt Readings from Last 3 Encounters:  ?09/02/21 173 lb 11.2 oz (78.8 kg)  ?08/19/21 177 lb 6.4 oz (80.5 kg)  ?07/04/21 177 lb 3.2 oz (80.4 kg)  ?  ?    ?Objective:   ? ?Vital Signs:  BP (!) 155/78   Pulse 69   Ht 5' (1.524 m)   Wt 173 lb 11.2 oz (78.8 kg)   BMI 33.92 kg/m?   ? ?ASSESSMENT & PLAN:   ? ?OSA - The patient is tolerating PAP therapy well without any problems.   The patient has been using and benefiting from PAP use and will continue to benefit from therapy.  ?-I will call her DME to get a download on her device ?-I have encouraged her to adjust the humidity in her  mask ? ?Hypertension ?-BP is borderline controlled on exam ?-Continue prescription drug management with diltiazem 180 mg daily with as needed refills ?   ? ?Time:   ?Today, I have spent 15 minutes with the patient with telehealth technology discussing the above problems.   ? ? ?Medication Adjustments/Labs and Tests Ordered: ?Current medicines are reviewed at length with the patient today.  Concerns regarding medicines are outlined above.  ? ?Tests Ordered: ?No orders of the defined types were placed in this encounter. ? ? ?Medication Changes: ?No orders of  the defined types were placed in this encounter. ? ? ?Follow Up:  In Person in 1 year(s) ? ?Signed, ?Fransico Him, MD  ?09/02/2021 8:12 AM    ?[place] ?

## 2021-09-05 ENCOUNTER — Telehealth: Payer: Self-pay | Admitting: *Deleted

## 2021-09-05 DIAGNOSIS — G4733 Obstructive sleep apnea (adult) (pediatric): Secondary | ICD-10-CM

## 2021-09-05 NOTE — Telephone Encounter (Signed)
-----   Message from Michaelyn Barter, RN sent at 09/02/2021  8:38 AM EDT ----- ?FOr [place] please have Martha Gilmore get a download on device and followup with me in 1 year ? ?

## 2021-09-05 NOTE — Telephone Encounter (Signed)
Download sent to dr Radford Pax via secure chat and one year follow up made by Marcelle Overlie. ?

## 2021-09-06 NOTE — Telephone Encounter (Signed)
AHI it too high - please change to auto CPAP from 4 to 20cm H2O. ?her see her DME about large mask leak - get a download in 4 weeks ?

## 2021-09-06 NOTE — Addendum Note (Signed)
Addended by: Freada Bergeron on: 09/06/2021 09:25 AM ? ? Modules accepted: Orders ? ?

## 2021-09-14 ENCOUNTER — Other Ambulatory Visit: Payer: Medicare Other

## 2021-09-15 ENCOUNTER — Telehealth: Payer: Self-pay | Admitting: Pharmacist

## 2021-09-15 NOTE — Progress Notes (Signed)
Chronic Care Management Pharmacy Assistant   Name: Martha Gilmore  MRN: [id number] DOB: 1941/03/07   Reason for Encounter: General Adherence Call    Recent office visits:  08/19/2021 OV (PCP) Martha Arnt, MD;  She will continue with tramadol.  We we will try a Lidoderm patch.  Continue gabapentin at night.  If not improving, would have to consider escalating pain medication to hydrocodone or Tylenol 3.  05/02/2021 OV (PCP) Martha Arnt, MD; no medication changes indicated.  Recent consult visits:  07/04/2021 OV (Cardiology) Tobb, Kardie, DO; no medication changes indicated.  05/30/2021 OV (Endocrinology) Martha Patter, MD; no medication changes indicated.  04/29/2021 OV (Cardiology) Martha Haw, MD; no medication changes indicated.  04/20/2021 OV (Sports Medicine) Martha Hams, MD; no medication changes indicated.  Hospital visits:  None in previous 6 months  Medications: Outpatient Encounter Medications as of 09/15/2021  Medication Sig   apixaban (ELIQUIS) 5 MG TABS tablet Take 1 tablet (5 mg total) by mouth 2 (two) times daily.   atorvastatin (LIPITOR) 10 MG tablet TAKE 1 TABLET BY MOUTH EVERYDAY AT BEDTIME   buPROPion (WELLBUTRIN XL) 150 MG 24 hr tablet Take 1 tablet (150 mg total) by mouth daily.   citalopram (CELEXA) 20 MG tablet Take 1 tablet (20 mg total) by mouth daily.   diltiazem (CARDIZEM CD) 180 MG 24 hr capsule Take 1 capsule (180 mg total) by mouth daily.   gabapentin (NEURONTIN) 600 MG tablet Take 0.5 tablets (300 mg total) by mouth at bedtime.   levothyroxine (SYNTHROID) 100 MCG tablet Take 1 tablet (100 mcg total) by mouth daily.   Multiple Vitamins-Minerals (PRESERVISION AREDS PO) Take 1 capsule by mouth in the morning and at bedtime.   omeprazole (PRILOSEC) 20 MG capsule TAKE 1 CAPSULE BY MOUTH EVERY DAY   oxybutynin (DITROPAN-XL) 10 MG 24 hr tablet TAKE 1 TABLET BY MOUTH AT  BEDTIME   Polyethyl Glycol-Propyl Glycol (SYSTANE OP) Place 1  drop into both eyes daily as needed (DRY EYES).   traMADol (ULTRAM) 50 MG tablet Take 1 tablet (50 mg total) by mouth 3 (three) times daily as needed.   No facility-administered encounter medications on file as of 09/15/2021.   Contacted Martha Gilmore for [employer] Review Call   Chart Review:  Have there been any documented new, changed, or discontinued medications since last visit? No  Has there been any documented recent hospitalizations or ED visits since last visit with Clinical Pharmacist? No   Adherence Review:  Does the Clinical Pharmacist Assistant have access to adherence rates? Yes Adherence rates for STAR metric medications: Atorvastatin 10 mg last filled 07/06/2021 90 DS Does the patient have >5 day gap between last estimated fill dates for any of the above medications or other medication gaps? No Reason for medication gaps.   Disease State Questions:  Able to connect with Patient? Yes Did patient have any problems with their health recently? No Have you had any admissions or emergency room visits or worsening of your condition(s) since last visit? No Have you had any visits with new specialists or providers since your last visit? No Have you had any new health care problem(s) since your last visit? No Have you run out of any of your medications since you last spoke with clinical pharmacist? No Are there any medications you are not taking as prescribed? No Are you having any issues or side effects with your medications? No Do you have any other health concerns or questions  you want to discuss with your Clinical Pharmacist before your next visit? No Are there any health concerns that you feel we can do a better job addressing? No Are you having any problems with any of the following since the last visit:  None 12. Any falls since last visit? No 13. Any increased or uncontrolled pain since last visit? No   Care Gaps: Medicare Annual Wellness: Completed  06/09/2021 Hemoglobin A1C: none available Colonoscopy: Aged out Dexa Scan: Next due on 08/18/2023 Mammogram: Next due on 05/05/2022  Future Appointments  Date Time Provider [place]  10/05/2021 10:00 AM Martha Salines, DO [facility]  10/17/2021  9:30 AM Martha Arnt, MD [facility]  11/14/2021 10:45 AM Martha Haw, MD [facility]  01/10/2022 10:20 AM Martha Salines, DO [facility]  06/22/2022  8:45 AM [facility] HEALTH COACH [facility]   Star Rating Drugs: Atorvastatin 10 mg last filled 07/06/2021 90 DS  Martha Gilmore, [place] Pharmacist Assistant [phone]

## 2021-09-28 ENCOUNTER — Encounter: Payer: Self-pay | Admitting: Cardiology

## 2021-09-28 DIAGNOSIS — G4733 Obstructive sleep apnea (adult) (pediatric): Secondary | ICD-10-CM

## 2021-10-05 ENCOUNTER — Encounter: Payer: Self-pay | Admitting: Cardiology

## 2021-10-05 ENCOUNTER — Ambulatory Visit: Payer: Medicare Other | Admitting: Cardiology

## 2021-10-05 VITALS — BP 140/58 | HR 60 | Ht 60.0 in | Wt 179.0 lb

## 2021-10-05 DIAGNOSIS — E782 Mixed hyperlipidemia: Secondary | ICD-10-CM | POA: Diagnosis not present

## 2021-10-05 DIAGNOSIS — E669 Obesity, unspecified: Secondary | ICD-10-CM

## 2021-10-05 DIAGNOSIS — I483 Typical atrial flutter: Secondary | ICD-10-CM | POA: Diagnosis not present

## 2021-10-05 DIAGNOSIS — G4733 Obstructive sleep apnea (adult) (pediatric): Secondary | ICD-10-CM

## 2021-10-05 DIAGNOSIS — Z9989 Dependence on other enabling machines and devices: Secondary | ICD-10-CM

## 2021-10-05 NOTE — Progress Notes (Signed)
Cardiology Office Note:    Date:  10/05/2021   ID:  Martha Gilmore, DOB 05-Jan-1941, MRN [id number]  PCP:  Leamon Arnt, MD  Cardiologist:  Berniece Salines, DO  Electrophysiologist:  None   Referring MD: Leamon Arnt, MD   Chief Complaint  Patient presents with   Follow-up    6 months.   Edema    Legs and feet.    History of Present Illness:    Martha Gilmore is a 81 y.o. female with a hx of atrial flutter status post ablation on Eliquis, aortic insufficiency, hypertension, hyperlipidemia, OSA here today for follow-up visit.   She offers that she is still experiencing great deal of fatigue. She is looking forward to her upcoming procedure.    Past Medical History:  Diagnosis Date   Aortic insufficiency    mild to moderate by echo 12/2020   Atrial flutter ([place]) 12/30/2020   Cataracts, bilateral 09/23/2015   Clostridium difficile infection 04/16/2017   H/o C.diff due to clindamicyin 09/2016; two rounds of treatment for cure. Dr. Benson Norway   Depression    Hyperlipidemia    Hypertension    OAB (overactive bladder) 12/16/2013   Osteopenia 08/05/2012   Dexa scan 07/2012 mild osteopenia, T = -1.1   Seasonal allergies    Urge and stress incontinence     Past Surgical History:  Procedure Laterality Date   APPENDECTOMY  1971   CARDIOVERSION N/A 04/13/2021   Procedure: CARDIOVERSION;  Surgeon: Werner Lean, MD;  Location: [facility];  Service: Cardiovascular;  Laterality: N/A;   CESAREAN SECTION     CHOLECYSTECTOMY     Teeth Implants     TRIGGER FINGER RELEASE     VAGINAL HYSTERECTOMY      Current Medications: Current Meds  Medication Sig   apixaban (ELIQUIS) 5 MG TABS tablet Take 1 tablet (5 mg total) by mouth 2 (two) times daily.   atorvastatin (LIPITOR) 10 MG tablet TAKE 1 TABLET BY MOUTH EVERYDAY AT BEDTIME   citalopram (CELEXA) 20 MG tablet Take 1 tablet (20 mg total) by mouth daily.   diltiazem (CARDIZEM CD) 180 MG 24 hr capsule Take 1 capsule (180 mg  total) by mouth daily.   gabapentin (NEURONTIN) 600 MG tablet Take 0.5 tablets (300 mg total) by mouth at bedtime.   levothyroxine (SYNTHROID) 100 MCG tablet Take 1 tablet (100 mcg total) by mouth daily.   Multiple Vitamins-Minerals (PRESERVISION AREDS PO) Take 1 capsule by mouth in the morning and at bedtime.   omeprazole (PRILOSEC) 20 MG capsule TAKE 1 CAPSULE BY MOUTH EVERY DAY   oxybutynin (DITROPAN-XL) 10 MG 24 hr tablet TAKE 1 TABLET BY MOUTH AT  BEDTIME   Polyethyl Glycol-Propyl Glycol (SYSTANE OP) Place 1 drop into both eyes daily as needed (DRY EYES).   traMADol (ULTRAM) 50 MG tablet Take 1 tablet (50 mg total) by mouth 3 (three) times daily as needed.     Allergies:   Aspirin, Meperidine, Penicillins, Statins, Cephalexin, Clindamycin/lincomycin, Demerol  [meperidine hcl], and Tape   Social History   Socioeconomic History   Marital status: Widowed    Spouse name: Not on file   Number of children: Not on file   Years of education: Not on file   Highest education level: Not on file  Occupational History   Not on file  Tobacco Use   Smoking status: Never   Smokeless tobacco: Never  Vaping Use   Vaping Use: Never used  Substance and Sexual Activity  Alcohol use: No   Drug use: No   Sexual activity: Never    Partners: Male  Other Topics Concern   Not on file  Social History Narrative   Not on file   Social Determinants of Health   Financial Resource Strain: Low Risk  (06/09/2021)   Overall Financial Resource Strain (CARDIA)    Difficulty of Paying Living Expenses: Not hard at all  Food Insecurity: No Food Insecurity (06/09/2021)   Hunger Vital Sign    Worried About Running Out of Food in the Last Year: Never true    Ran Out of Food in the Last Year: Never true  Transportation Needs: No Transportation Needs (06/09/2021)   PRAPARE - Hydrologist (Medical): No    Lack of Transportation (Non-Medical): No  Physical Activity: Inactive  (06/09/2021)   Exercise Vital Sign    Days of Exercise per Week: 0 days    Minutes of Exercise per Session: 0 min  Stress: No Stress Concern Present (06/09/2021)   [place]    Feeling of Stress : Not at all  Social Connections: Moderately Integrated (06/09/2021)   Social Connection and Isolation Panel [NHANES]    Frequency of Communication with Friends and Family: Once a week    Frequency of Social Gatherings with Friends and Family: More than three times a week    Attends Religious Services: More than 4 times per year    Active Member of [employer] or Organizations: Yes    Attends Archivist Meetings: 1 to 4 times per year    Marital Status: Widowed     Family History: The patient's family history includes Brain cancer in her brother; Cancer in her father; Diabetes in her sister; Heart disease in her father, mother, and sister; Hypertension in her mother; Kidney cancer in her sister; Lung cancer in her sister.  ROS:   Review of Systems  Constitution: Negative for decreased appetite, fever and weight gain.  HENT: Negative for congestion, ear discharge, hoarse voice and sore throat.   Eyes: Negative for discharge, redness, vision loss in right eye and visual halos.  Cardiovascular: Negative for chest pain, dyspnea on exertion, leg swelling, orthopnea and palpitations.  Respiratory: Negative for cough, hemoptysis, shortness of breath and snoring.   Endocrine: Negative for heat intolerance and polyphagia.  Hematologic/Lymphatic: Negative for bleeding problem. Does not bruise/bleed easily.  Skin: Negative for flushing, nail changes, rash and suspicious lesions.  Musculoskeletal: Negative for arthritis, joint pain, muscle cramps, myalgias, neck pain and stiffness.  Gastrointestinal: Negative for abdominal pain, bowel incontinence, diarrhea and excessive appetite.  Genitourinary: Negative for decreased libido, genital  sores and incomplete emptying.  Neurological: Negative for brief paralysis, focal weakness, headaches and loss of balance.  Psychiatric/Behavioral: Negative for altered mental status, depression and suicidal ideas.  Allergic/Immunologic: Negative for HIV exposure and persistent infections.    EKGs/Labs/Other Studies Reviewed:    The following studies were reviewed today:   EKG:  None today   TTE 01/18/2021 Zio monitor  Predominant rhythm was atrial flutter with average heart rate 92bpm and ranged from 48 to 172bpm. No sinus rhythm was present. Occasional PVCs were present.   TTE 01/18/2021 IMPRESSIONS   1. The aortic valve is calcified. Aortic valve regurgitation is mild to moderate. Mild to moderate aortic valve sclerosis/calcification is present, without any evidence of aortic stenosis and stroke volume index  of 38.   2. Left ventricular ejection fraction,  by estimation, is 60 to 65%. The  left ventricle has normal function. The left ventricle has no regional  wall motion abnormalities. Left ventricular diastolic parameters are  indeterminate due to atrial flutter.   3. Right ventricular systolic function is normal. The right ventricular  size is normal. There is normal pulmonary artery systolic pressure. The  estimated right ventricular systolic pressure is 91.6 mmHg.   4. Left atrial size was mildly dilated.   5. The mitral valve is abnormal. Mild mitral valve regurgitation. The  mean mitral valve gradient is 2.0 mmHg with average heart rate of 103 bpm.   6. The inferior vena cava is normal in size with greater than 50%  respiratory variability, suggesting right atrial pressure of 3 mmHg.   Comparison(s): No prior Echocardiogram.   FINDINGS   Left Ventricle: Left ventricular ejection fraction, by estimation, is 60  to 65%. The left ventricle has normal function. The left ventricle has no  regional wall motion abnormalities. The left ventricular internal cavity  size was  normal in size. There is   no left ventricular hypertrophy. Left ventricular diastolic parameters  are indeterminate.   Right Ventricle: The right ventricular size is normal. No increase in  right ventricular wall thickness. Right ventricular systolic function is  normal. There is normal pulmonary artery systolic pressure. The tricuspid  regurgitant velocity is 1.94 m/s, and   with an assumed right atrial pressure of 3 mmHg, the estimated right  ventricular systolic pressure is 38.4 mmHg.   Left Atrium: Left atrial size was mildly dilated.   Right Atrium: Right atrial size was normal in size.   Pericardium: There is no evidence of pericardial effusion.   Mitral Valve: The mitral valve is abnormal. Mild mitral valve  regurgitation. The mean mitral valve gradient is 2.0 mmHg with average  heart rate of 103 bpm.   Tricuspid Valve: The tricuspid valve is grossly normal. Tricuspid valve  regurgitation is mild . No evidence of tricuspid stenosis.   Aortic Valve: The aortic valve is calcified. Aortic valve regurgitation is  mild to moderate. Mild to moderate aortic valve sclerosis/calcification is  present, without any evidence of aortic stenosis. Aortic valve mean  gradient measures 11.0 mmHg. Aortic  valve peak gradient measures 19.4 mmHg. Aortic valve area, by VTI measures  1.88 cm.   Pulmonic Valve: The pulmonic valve was grossly normal. Pulmonic valve  regurgitation is mild.   Aorta: The aortic root and ascending aorta are structurally normal, with  no evidence of dilitation.   Pulmonary Artery: The pulmonary artery is of normal size.   Venous: The inferior vena cava is normal in size with greater than 50%  respiratory variability, suggesting right atrial pressure of 3 mmHg.   IAS/Shunts: The atrial septum is grossly normal.   Recent Labs: 04/04/2021: Magnesium 2.1 05/02/2021: ALT 13; BUN 21; Creatinine, Ser 0.94; Hemoglobin 13.2; Platelets 272.0; Potassium 4.7; Sodium 139;  TSH 6.68  Recent Lipid Panel    Component Value Date/Time   CHOL 200 05/02/2021 0901   TRIG 57.0 05/02/2021 0901   HDL 67.20 05/02/2021 0901   CHOLHDL 3 05/02/2021 0901   VLDL 11.4 05/02/2021 0901   LDLCALC 122 (H) 05/02/2021 0901    Physical Exam:    VS:  BP (!) 140/58 (BP Location: Left Arm, Patient Position: Sitting, Cuff Size: Normal)   Pulse 60   Ht 5' (1.524 m)   Wt 179 lb (81.2 kg)   BMI 34.96 kg/m     Wt  Readings from Last 3 Encounters:  10/05/21 179 lb (81.2 kg)  09/02/21 173 lb 11.2 oz (78.8 kg)  08/19/21 177 lb 6.4 oz (80.5 kg)     GEN: Well nourished, well developed in no acute distress HEENT: Normal NECK: No JVD; No carotid bruits LYMPHATICS: No lymphadenopathy CARDIAC: S1S2 noted,RRR, no murmurs, rubs, gallops RESPIRATORY:  Clear to auscultation without rales, wheezing or rhonchi  ABDOMEN: Soft, non-tender, non-distended, +bowel sounds, no guarding. EXTREMITIES: No edema, No cyanosis, no clubbing MUSCULOSKELETAL:  No deformity  SKIN: Warm and dry NEUROLOGIC:  Alert and oriented x 3, non-focal PSYCHIATRIC:  Normal affect, good insight  ASSESSMENT:    1. Typical atrial flutter (HCC)   2. Obesity (BMI 30-39.9)   3. OSA on CPAP   4. Mixed hyperlipidemia    PLAN:     Atrial flutter - cont Cardizem and Eliquis. Schedule for atrial flutter ablation 6/23. Blood pressure is acceptable, continue with current antihypertensive regimen. Hyperlipidemia - continue with current statin medication. The patient understands the need to lose weight with diet and exercise. We have discussed specific strategies for this.  The patient is in agreement with the above plan. The patient left the office in stable condition.  The patient will follow up in 6 months or sooner if needed.   Medication Adjustments/Labs and Tests Ordered: Current medicines are reviewed at length with the patient today.  Concerns regarding medicines are outlined above.  No orders of the defined  types were placed in this encounter.  No orders of the defined types were placed in this encounter.   Patient Instructions  Medication Instructions:  Your physician recommends that you continue on your current medications as directed. Please refer to the Current Medication list given to you today.  *If you need a refill on your cardiac medications before your next appointment, please call your pharmacy*   Follow-Up: At [facility], you and your health needs are our priority.  As part of our continuing mission to provide you with exceptional heart care, we have created designated Provider Care Teams.  These Care Teams include your primary Cardiologist (physician) and Advanced Practice Providers (APPs -  Physician Assistants and Nurse Practitioners) who all work together to provide you with the care you need, when you need it.  We recommend signing up for the patient portal called "MyChart".  Sign up information is provided on this After Visit Summary.  MyChart is used to connect with patients for Virtual Visits (Telemedicine).  Patients are able to view lab/test results, encounter notes, upcoming appointments, etc.  Non-urgent messages can be sent to your provider as well.   To learn more about what you can do with MyChart, go to [email or web address].    Your next appointment:   6 month(s)  The format for your next appointment:   In Person  Provider:   Berniece Salines, DO    Adopting a Healthy Lifestyle.  Know what a healthy weight is for you (roughly BMI <25) and aim to maintain this   Aim for 7+ servings of fruits and vegetables daily   65-80+ fluid ounces of water or unsweet tea for healthy kidneys   Limit to max 1 drink of alcohol per day; avoid smoking/tobacco   Limit animal fats in diet for cholesterol and heart health - choose grass fed whenever available   Avoid highly processed foods, and foods high in saturated/trans fats   Aim for low stress - take time to unwind  and care for your mental  health   Aim for 150 min of moderate intensity exercise weekly for heart health, and weights twice weekly for bone health   Aim for 7-9 hours of sleep daily   When it comes to diets, agreement about the perfect plan isnt easy to find, even among the experts. Experts at the [employer] developed an idea known as the Healthy Eating Plate. Just imagine a plate divided into logical, healthy portions.   The emphasis is on diet quality:   Load up on vegetables and fruits - one-half of your plate: Aim for color and variety, and remember that potatoes dont count.   Go for whole grains - one-quarter of your plate: Whole wheat, barley, wheat berries, quinoa, oats, brown rice, and foods made with them. If you want pasta, go with whole wheat pasta.   Protein power - one-quarter of your plate: Fish, chicken, beans, and nuts are all healthy, versatile protein sources. Limit red meat.   The diet, however, does go beyond the plate, offering a few other suggestions.   Use healthy plant oils, such as olive, canola, soy, corn, sunflower and peanut. Check the labels, and avoid partially hydrogenated oil, which have unhealthy trans fats.   If youre thirsty, drink water. Coffee and tea are good in moderation, but skip sugary drinks and limit milk and dairy products to one or two daily servings.   The type of carbohydrate in the diet is more important than the amount. Some sources of carbohydrates, such as vegetables, fruits, whole grains, and beans-are healthier than others.   Finally, stay active  Signed, Berniece Salines, DO  10/05/2021 8:21 PM    [facility]

## 2021-10-05 NOTE — Patient Instructions (Signed)
Medication Instructions:  Your physician recommends that you continue on your current medications as directed. Please refer to the Current Medication list given to you today.  *If you need a refill on your cardiac medications before your next appointment, please call your pharmacy*   Follow-Up: At [facility], you and your health needs are our priority.  As part of our continuing mission to provide you with exceptional heart care, we have created designated Provider Care Teams.  These Care Teams include your primary Cardiologist (physician) and Advanced Practice Providers (APPs -  Physician Assistants and Nurse Practitioners) who all work together to provide you with the care you need, when you need it.  We recommend signing up for the patient portal called "MyChart".  Sign up information is provided on this After Visit Summary.  MyChart is used to connect with patients for Virtual Visits (Telemedicine).  Patients are able to view lab/test results, encounter notes, upcoming appointments, etc.  Non-urgent messages can be sent to your provider as well.   To learn more about what you can do with MyChart, go to [email or web address].    Your next appointment:   6 month(s)  The format for your next appointment:   In Person  Provider:   Berniece Salines, DO

## 2021-10-07 ENCOUNTER — Other Ambulatory Visit: Payer: Medicare Other | Admitting: *Deleted

## 2021-10-07 DIAGNOSIS — Z01812 Encounter for preprocedural laboratory examination: Secondary | ICD-10-CM

## 2021-10-07 DIAGNOSIS — I483 Typical atrial flutter: Secondary | ICD-10-CM

## 2021-10-08 LAB — BASIC METABOLIC PANEL
BUN/Creatinine Ratio: 25 (ref 12–28)
BUN: 23 mg/dL (ref 8–27)
CO2: 27 mmol/L (ref 20–29)
Calcium: 9.4 mg/dL (ref 8.7–10.3)
Chloride: 101 mmol/L (ref 96–106)
Creatinine, Ser: 0.91 mg/dL (ref 0.57–1.00)
Glucose: 79 mg/dL (ref 70–99)
Potassium: 4.8 mmol/L (ref 3.5–5.2)
Sodium: 140 mmol/L (ref 134–144)
eGFR: 64 mL/min/{1.73_m2} (ref >59)

## 2021-10-08 LAB — CBC
Hematocrit: 37.4 % (ref 34.0–46.6)
Hemoglobin: 12.5 g/dL (ref 11.1–15.9)
MCH: 29.1 pg (ref 26.6–33.0)
MCHC: 33.4 g/dL (ref 31.5–35.7)
MCV: 87 fL (ref 79–97)
Platelets: 243 10*3/uL (ref 150–450)
RBC: 4.3 x10E6/uL (ref 3.77–5.28)
RDW: 13.1 % (ref 11.7–15.4)
WBC: 6.4 10*3/uL (ref 3.4–10.8)

## 2021-10-11 ENCOUNTER — Encounter: Payer: Self-pay | Admitting: Cardiology

## 2021-10-14 ENCOUNTER — Ambulatory Visit (HOSPITAL_BASED_OUTPATIENT_CLINIC_OR_DEPARTMENT_OTHER): Payer: Medicare Other | Admitting: Certified Registered Nurse Anesthetist

## 2021-10-14 ENCOUNTER — Ambulatory Visit (HOSPITAL_COMMUNITY): Payer: Medicare Other | Admitting: Certified Registered Nurse Anesthetist

## 2021-10-14 ENCOUNTER — Encounter (HOSPITAL_COMMUNITY)
Admission: RE | Disposition: A | Discharge status: Home / Self Care | Payer: Medicare Other | Source: Ambulatory Visit | Attending: Cardiology

## 2021-10-14 ENCOUNTER — Other Ambulatory Visit: Payer: Self-pay

## 2021-10-14 ENCOUNTER — Ambulatory Visit (HOSPITAL_COMMUNITY)
Admission: RE | Admit: 2021-10-14 | Discharge: 2021-10-14 | Disposition: A | Discharge status: Home / Self Care | Payer: Medicare Other | Source: Ambulatory Visit | Attending: Cardiology | Admitting: Cardiology

## 2021-10-14 DIAGNOSIS — I4892 Unspecified atrial flutter: Secondary | ICD-10-CM | POA: Insufficient documentation

## 2021-10-14 DIAGNOSIS — E039 Hypothyroidism, unspecified: Secondary | ICD-10-CM | POA: Diagnosis not present

## 2021-10-14 DIAGNOSIS — G4730 Sleep apnea, unspecified: Secondary | ICD-10-CM | POA: Diagnosis not present

## 2021-10-14 DIAGNOSIS — M1990 Unspecified osteoarthritis, unspecified site: Secondary | ICD-10-CM | POA: Diagnosis not present

## 2021-10-14 DIAGNOSIS — E785 Hyperlipidemia, unspecified: Secondary | ICD-10-CM | POA: Insufficient documentation

## 2021-10-14 DIAGNOSIS — K219 Gastro-esophageal reflux disease without esophagitis: Secondary | ICD-10-CM | POA: Diagnosis not present

## 2021-10-14 DIAGNOSIS — I10 Essential (primary) hypertension: Secondary | ICD-10-CM

## 2021-10-14 HISTORY — PX: A-FLUTTER ABLATION: EP1230

## 2021-10-14 SURGERY — A-FLUTTER ABLATION
Anesthesia: General

## 2021-10-14 MED ORDER — ROCURONIUM BROMIDE 10 MG/ML (PF) SYRINGE
PREFILLED_SYRINGE | INTRAVENOUS | Status: DC | PRN
  Administered 2021-10-14: 60 mg via INTRAVENOUS

## 2021-10-14 MED ORDER — FENTANYL CITRATE (PF) 100 MCG/2ML IJ SOLN
INTRAMUSCULAR | Status: AC
  Filled 2021-10-14: qty 2

## 2021-10-14 MED ORDER — ONDANSETRON HCL 4 MG/2ML IJ SOLN: 4.0000 mg | Freq: Four times a day (QID) | INTRAMUSCULAR | Status: DC | PRN

## 2021-10-14 MED ORDER — LIDOCAINE 2% (20 MG/ML) 5 ML SYRINGE
INTRAMUSCULAR | Status: DC | PRN
  Administered 2021-10-14: 60 mg via INTRAVENOUS

## 2021-10-14 MED ORDER — FENTANYL CITRATE (PF) 100 MCG/2ML IJ SOLN
25.0000 ug | INTRAMUSCULAR | Status: DC | PRN
  Administered 2021-10-14 (×2): 50 ug via INTRAVENOUS
  Filled 2021-10-14: qty 2

## 2021-10-14 MED ORDER — SUGAMMADEX SODIUM 200 MG/2ML IV SOLN
INTRAVENOUS | Status: DC | PRN
  Administered 2021-10-14: 200 mg via INTRAVENOUS

## 2021-10-14 MED ORDER — ONDANSETRON HCL 4 MG/2ML IJ SOLN
INTRAMUSCULAR | Status: DC | PRN
  Administered 2021-10-14: 4 mg via INTRAVENOUS

## 2021-10-14 MED ORDER — SODIUM CHLORIDE 0.9 % IV SOLN: 250.0000 mL | INTRAVENOUS | Status: DC | PRN

## 2021-10-14 MED ORDER — ACETAMINOPHEN 10 MG/ML IV SOLN: 1000.0000 mg | Freq: Once | INTRAVENOUS | Status: DC | PRN

## 2021-10-14 MED ORDER — ACETAMINOPHEN 325 MG PO TABS: 650.0000 mg | ORAL_TABLET | ORAL | Status: DC | PRN

## 2021-10-14 MED ORDER — DEXAMETHASONE SODIUM PHOSPHATE 10 MG/ML IJ SOLN
INTRAMUSCULAR | Status: DC | PRN
  Administered 2021-10-14: 5 mg via INTRAVENOUS

## 2021-10-14 MED ORDER — HEPARIN (PORCINE) IN NACL 1000-0.9 UT/500ML-% IV SOLN
INTRAVENOUS | Status: DC | PRN
  Administered 2021-10-14 (×2): 500 mL

## 2021-10-14 MED ORDER — FENTANYL CITRATE (PF) 250 MCG/5ML IJ SOLN
INTRAMUSCULAR | Status: DC | PRN
  Administered 2021-10-14: 50 ug via INTRAVENOUS

## 2021-10-14 MED ORDER — SODIUM CHLORIDE 0.9% FLUSH: 3.0000 mL | INTRAVENOUS | Status: DC | PRN

## 2021-10-14 MED ORDER — SODIUM CHLORIDE 0.9 % IV SOLN: INTRAVENOUS | Status: DC

## 2021-10-14 MED ORDER — KETOROLAC TROMETHAMINE 10 MG PO TABS
50.0000 mg | ORAL_TABLET | Freq: Once | ORAL | Status: AC
  Administered 2021-10-14: 50 mg via ORAL
  Filled 2021-10-14: qty 5

## 2021-10-14 MED ORDER — PROPOFOL 10 MG/ML IV BOLUS
INTRAVENOUS | Status: DC | PRN
Start: 2021-10-14 — End: 2021-10-14
  Administered 2021-10-14: 110 mg via INTRAVENOUS

## 2021-10-14 SURGICAL SUPPLY — 10 items
CATH EZ STEER NAV 8MM F-J CUR (ABLATOR) ×1 IMPLANT
CATH WEBSTER BI DIR CS D-F CRV (CATHETERS) ×1 IMPLANT
CLOSURE PERCLOSE PROSTYLE (VASCULAR PRODUCTS) ×2 IMPLANT
PACK EP LATEX FREE (CUSTOM PROCEDURE TRAY) ×2
PACK EP LF (CUSTOM PROCEDURE TRAY) ×1 IMPLANT
PAD DEFIB RADIO PHYSIO CONN (PAD) ×2 IMPLANT
PATCH CARTO3 (PAD) ×1 IMPLANT
SHEATH PINNACLE 7F 10CM (SHEATH) ×1 IMPLANT
SHEATH PINNACLE 8F 10CM (SHEATH) ×1 IMPLANT
SHEATH PROBE COVER 6X72 (BAG) ×1 IMPLANT

## 2021-10-14 NOTE — H&P (Signed)
Electrophysiology Office Note   Date:  10/14/2021   ID:  Martha Gilmore, DOB 1940-04-29, MRN [id number]  PCP:  Willow Ora, MD  Cardiologist:  [place] Primary Electrophysiologist:  Ziggy Reveles Jorja Loa, MD    Chief Complaint: atrial flutter   History of Present Illness: Martha Gilmore is a 81 y.o. female who is being seen today for the evaluation of atrial flutter at the request of No ref. provider found. Presenting today for electrophysiology evaluation.  She has a history significant for hyperlipidemia, depression, atrial flutter.  She had an ECG done at her primary physician's office and that showed atrial flutter.  She was in atrial flutter, she had symptoms of severe fatigue and shortness of breath.  She found it difficult to do her daily activities.  She got cardioverted in the next day she felt quite well.  She was able to do all of her daily activities with only restriction being hip pain from a prior fall.  Today, denies symptoms of palpitations, chest pain, shortness of breath, orthopnea, PND, lower extremity edema, claudication, dizziness, presyncope, syncope, bleeding, or neurologic sequela. The patient is tolerating medications without difficulties. Plan ablation today.    Past Medical History:  Diagnosis Date   Aortic insufficiency    mild to moderate by echo 12/2020   Atrial flutter (HCC) 12/30/2020   Cataracts, bilateral 09/23/2015   Clostridium difficile infection 04/16/2017   H/o C.diff due to clindamicyin 09/2016; two rounds of treatment for cure. Dr. Elnoria Howard   Depression    Hyperlipidemia    Hypertension    OAB (overactive bladder) 12/16/2013   Osteopenia 08/05/2012   Dexa scan 07/2012 mild osteopenia, T = -1.1   Seasonal allergies    Urge and stress incontinence    Past Surgical History:  Procedure Laterality Date   APPENDECTOMY  1971   CARDIOVERSION N/A 04/13/2021   Procedure: CARDIOVERSION;  Surgeon: Christell Constant, MD;  Location: [facility];   Service: Cardiovascular;  Laterality: N/A;   CESAREAN SECTION     CHOLECYSTECTOMY     Teeth Implants     TRIGGER FINGER RELEASE     VAGINAL HYSTERECTOMY       Current Facility-Administered Medications  Medication Dose Route Frequency Provider Last Rate Last Admin   0.9 %  sodium chloride infusion   Intravenous Continuous Tacarra Justo, Andree Coss, MD 50 mL/hr at 10/14/21 1200 New Bag at 10/14/21 1200    Allergies:   Aspirin, Demerol  [meperidine hcl], Penicillins, Statins, Cephalexin, Clindamycin/lincomycin, and Tape   Social History:  The patient  reports that she has never smoked. She has never used smokeless tobacco. She reports that she does not drink alcohol and does not use drugs.   Family History:  The patient's family history includes Brain cancer in her brother; Cancer in her father; Diabetes in her sister; Heart disease in her father, mother, and sister; Hypertension in her mother; Kidney cancer in her sister; Lung cancer in her sister.   ROS:  Please see the history of present illness.   Otherwise, review of systems is positive for none.   All other systems are reviewed and negative.   PHYSICAL EXAM: VS:  BP (!) 155/64   Pulse (!) 58   Temp 98 F (36.7 C) (Temporal)   Resp 18   Ht 5' (1.524 m)   Wt 79.4 kg   SpO2 97%   BMI 34.18 kg/m  , BMI Body mass index is 34.18 kg/m. GEN: Well nourished, well developed, in no  acute distress  HEENT: normal  Neck: no JVD, carotid bruits, or masses Cardiac: RRR; no murmurs, rubs, or gallops,no edema  Respiratory:  clear to auscultation bilaterally, normal work of breathing GI: soft, nontender, nondistended, + BS MS: no deformity or atrophy  Skin: warm and dry Neuro:  Strength and sensation are intact Psych: euthymic mood, full affect  Recent Labs: 04/04/2021: Magnesium 2.1 05/02/2021: ALT 13; TSH 6.68 10/07/2021: BUN 23; Creatinine, Ser 0.91; Hemoglobin 12.5; Platelets 243; Potassium 4.8; Sodium 140    Lipid Panel      Component Value Date/Time   CHOL 200 05/02/2021 0901   TRIG 57.0 05/02/2021 0901   HDL 67.20 05/02/2021 0901   CHOLHDL 3 05/02/2021 0901   VLDL 11.4 05/02/2021 0901   LDLCALC 122 (H) 05/02/2021 0901     Wt Readings from Last 3 Encounters:  10/14/21 79.4 kg  10/05/21 81.2 kg  09/02/21 78.8 kg      Other studies Reviewed: Additional studies/ records that were reviewed today include: TTE 01/18/21  Review of the above records today demonstrates:   1. The aortic valve is calcified. Aortic valve regurgitation is mild to  moderate. Mild to moderate aortic valve sclerosis/calcification is  present, without any evidence of aortic stenosis and stroke volume index  of 38.   2. Left ventricular ejection fraction, by estimation, is 60 to 65%. The  left ventricle has normal function. The left ventricle has no regional  wall motion abnormalities. Left ventricular diastolic parameters are  indeterminate due to atrial flutter.   3. Right ventricular systolic function is normal. The right ventricular  size is normal. There is normal pulmonary artery systolic pressure. The  estimated right ventricular systolic pressure is 18.1 mmHg.   4. Left atrial size was mildly dilated.   5. The mitral valve is abnormal. Mild mitral valve regurgitation. The  mean mitral valve gradient is 2.0 mmHg with average heart rate of 103 bpm.   6. The inferior vena cava is normal in size with greater than 50%  respiratory variability, suggesting right atrial pressure of 3 mmHg.  Cardiac monitor 12/12/2020 personally reviewed Predominant rhythm was atrial flutter with average heart rate 92bpm and ranged from 48 to 172bpm. No sinus rhythm was present. Occasional PVCs were present  ASSESSMENT AND PLAN:  1.  Typical atrial flutter: Martha Gilmore has presented today for surgery, with the diagnosis of atrial flutter.  The various methods of treatment have been discussed with the patient and family. After consideration  of risks, benefits and other options for treatment, the patient has consented to  Procedure(s): Catheter ablation as a surgical intervention .  Risks include but not limited to complete heart block, stroke, esophageal damage, nerve damage, bleeding, vascular damage, tamponade, perforation, MI, and death. The patient's history has been reviewed, patient examined, no change in status, stable for surgery.  I have reviewed the patient's chart and labs.  Questions were answered to the patient's satisfaction.    Vallory Oetken Elberta Fortis, MD 10/14/2021 12:17 PM

## 2021-10-14 NOTE — Anesthesia Postprocedure Evaluation (Signed)
Anesthesia Post Note  Patient: [place]  Procedure(s) Performed: A-FLUTTER ABLATION     Patient location during evaluation: PACU Anesthesia Type: General Level of consciousness: awake and alert Pain management: pain level controlled Vital Signs Assessment: post-procedure vital signs reviewed and stable Respiratory status: spontaneous breathing, nonlabored ventilation, respiratory function stable and patient connected to nasal cannula oxygen Cardiovascular status: blood pressure returned to baseline and stable Postop Assessment: no apparent nausea or vomiting Anesthetic complications: no   There were no known notable events for this encounter.  Last Vitals:  Vitals:   10/14/21 1520 10/14/21 1525  BP: (!) 165/56 (!) 182/61  Pulse: 65 70  Resp: 11 13  Temp:    SpO2: 98% 97%    Last Pain:  Vitals:   10/14/21 1514  TempSrc:   PainSc: 9                  Joycelynn Fritsche P Mayford Alberg

## 2021-10-14 NOTE — Anesthesia Procedure Notes (Signed)
Procedure Name: Intubation Date/Time: 10/14/2021 1:07 PM  Performed by: Epifanio Lesches, CRNAPre-anesthesia Checklist: Patient identified, Emergency Drugs available, Suction available, Timeout performed and Patient being monitored Patient Re-evaluated:Patient Re-evaluated prior to induction Oxygen Delivery Method: Circle system utilized Preoxygenation: Pre-oxygenation with 100% oxygen Induction Type: IV induction Ventilation: Mask ventilation without difficulty Laryngoscope Size: Mac and 4 Grade View: Grade II Tube type: Oral Tube size: 7.0 mm Airway Equipment and Method: Stylet Placement Confirmation: ETT inserted through vocal cords under direct vision, positive ETCO2, CO2 detector and breath sounds checked- equal and bilateral Secured at: 21 cm Tube secured with: Tape Dental Injury: Teeth and Oropharynx as per pre-operative assessment

## 2021-10-15 ENCOUNTER — Encounter (HOSPITAL_COMMUNITY): Payer: Self-pay | Admitting: Cardiology

## 2021-10-17 ENCOUNTER — Ambulatory Visit: Payer: Medicare Other | Admitting: Family Medicine

## 2021-10-31 ENCOUNTER — Ambulatory Visit: Payer: Medicare Other | Admitting: Family Medicine

## 2021-11-01 ENCOUNTER — Encounter: Payer: Self-pay | Admitting: Cardiology

## 2021-11-01 ENCOUNTER — Other Ambulatory Visit: Payer: Self-pay | Admitting: Family Medicine

## 2021-11-14 ENCOUNTER — Encounter: Payer: Self-pay | Admitting: Cardiology

## 2021-11-14 ENCOUNTER — Ambulatory Visit (INDEPENDENT_AMBULATORY_CARE_PROVIDER_SITE_OTHER): Payer: Medicare Other | Admitting: Cardiology

## 2021-11-14 VITALS — BP 122/70 | HR 61 | Ht 60.0 in | Wt 178.0 lb

## 2021-11-14 DIAGNOSIS — I483 Typical atrial flutter: Secondary | ICD-10-CM | POA: Diagnosis not present

## 2021-11-14 NOTE — Progress Notes (Signed)
Electrophysiology Office Note   Date:  11/14/2021   ID:  Martha Gilmore, DOB [birth year], MRN [id number]  PCP:  Leamon Arnt, MD  Cardiologist:  Tobb Primary Electrophysiologist:  Cheridan Kibler Meredith Leeds, MD    Chief Complaint: atrial flutter   History of Present Illness: Martha Gilmore is a 81 y.o. female who is being seen today for the evaluation of atrial flutter at the request of Leamon Arnt, MD. Presenting today for electrophysiology evaluation.  She has a history significant for hyperlipidemia, depression, atrial flutter.  She had an ECG done at her primary physician's office that showed atrial flutter.  She has symptoms of fatigue and shortness of breath.  She found it difficult to do her daily activities.  She is now status post ablation 10/14/2021.  Today, denies symptoms of palpitations, chest pain, shortness of breath, orthopnea, PND, lower extremity edema, claudication, dizziness, presyncope, syncope, bleeding, or neurologic sequela. The patient is tolerating medications without difficulties.  Since her ablation she has done well.  She has no chest pain or shortness of breath.  She feels that she is able to do all of her daily activities.  She does get somewhat fatigued, but she had been partially compliant with her CPAP.  Since getting back on it her fatigue has significantly improved.   Past Medical History:  Diagnosis Date   Aortic insufficiency    mild to moderate by echo 12/2020   Atrial flutter ([place]) 12/30/2020   Cataracts, bilateral 09/23/2015   Clostridium difficile infection 04/16/2017   H/o C.diff due to clindamicyin 09/2016; two rounds of treatment for cure. Dr. Benson Norway   Depression    Hyperlipidemia    Hypertension    OAB (overactive bladder) 12/16/2013   Osteopenia 08/05/2012   Dexa scan 07/2012 mild osteopenia, T = -1.1   Seasonal allergies    Urge and stress incontinence    Past Surgical History:  Procedure Laterality Date   A-FLUTTER ABLATION N/A  10/14/2021   Procedure: A-FLUTTER ABLATION;  Surgeon: Constance Haw, MD;  Location: [facility];  Service: Cardiovascular;  Laterality: N/A;   APPENDECTOMY  1971   CARDIOVERSION N/A 04/13/2021   Procedure: CARDIOVERSION;  Surgeon: Werner Lean, MD;  Location: [facility];  Service: Cardiovascular;  Laterality: N/A;   CESAREAN SECTION     CHOLECYSTECTOMY     Teeth Implants     TRIGGER FINGER RELEASE     VAGINAL HYSTERECTOMY       Current Outpatient Medications  Medication Sig Dispense Refill   atorvastatin (LIPITOR) 10 MG tablet TAKE 1 TABLET BY MOUTH EVERYDAY AT BEDTIME 90 tablet 3   citalopram (CELEXA) 20 MG tablet Take 1 tablet (20 mg total) by mouth daily. 90 tablet 3   gabapentin (NEURONTIN) 600 MG tablet Take 0.5 tablets (300 mg total) by mouth at bedtime. 45 tablet 3   levothyroxine (SYNTHROID) 100 MCG tablet Take 1 tablet (100 mcg total) by mouth daily. (Patient taking differently: Take 100-150 mcg by mouth See admin instructions. Mon- Sat 100 mcg daily, and Sun 150 mcg daily) 90 tablet 3   Multiple Vitamins-Minerals (PRESERVISION AREDS PO) Take 1 capsule by mouth in the morning and at bedtime.     omeprazole (PRILOSEC) 20 MG capsule TAKE 1 CAPSULE BY MOUTH EVERY DAY 90 capsule 3   oxybutynin (DITROPAN-XL) 10 MG 24 hr tablet TAKE 1 TABLET BY MOUTH AT  BEDTIME 90 tablet 3   Polyethyl Glycol-Propyl Glycol (SYSTANE OP) Place 1 drop into both eyes  daily as needed (DRY EYES).     traMADol (ULTRAM) 50 MG tablet TAKE 1 TABLET BY MOUTH 3 TIMES DAILY AS NEEDED. 60 tablet 2   diltiazem (CARDIZEM CD) 180 MG 24 hr capsule Take 1 capsule (180 mg total) by mouth daily. 90 capsule 3   No current facility-administered medications for this visit.    Allergies:   Aspirin, Demerol  [meperidine hcl], Penicillins, Statins, Cephalexin, Clindamycin/lincomycin, and Tape   Social History:  The patient  reports that she has never smoked. She has never used smokeless tobacco. She  reports that she does not drink alcohol and does not use drugs.   Family History:  The patient's family history includes Brain cancer in her brother; Cancer in her father; Diabetes in her sister; Heart disease in her father, mother, and sister; Hypertension in her mother; Kidney cancer in her sister; Lung cancer in her sister.   ROS:  Please see the history of present illness.   Otherwise, review of systems is positive for none.   All other systems are reviewed and negative.   PHYSICAL EXAM: VS:  BP 122/70   Pulse 61   Ht 5' (1.524 m)   Wt 178 lb (80.7 kg)   SpO2 98%   BMI 34.76 kg/m  , BMI Body mass index is 34.76 kg/m. GEN: Well nourished, well developed, in no acute distress  HEENT: normal  Neck: no JVD, carotid bruits, or masses Cardiac: RRR; no murmurs, rubs, or gallops,no edema  Respiratory:  clear to auscultation bilaterally, normal work of breathing GI: soft, nontender, nondistended, + BS MS: no deformity or atrophy  Skin: warm and dry Neuro:  Strength and sensation are intact Psych: euthymic mood, full affect  EKG:  EKG is ordered today. Personal review of the ekg ordered shows sinus rhythm, rate 61  Recent Labs: 04/04/2021: Magnesium 2.1 05/02/2021: ALT 13; TSH 6.68 10/07/2021: BUN 23; Creatinine, Ser 0.91; Hemoglobin 12.5; Platelets 243; Potassium 4.8; Sodium 140    Lipid Panel     Component Value Date/Time   CHOL 200 05/02/2021 0901   TRIG 57.0 05/02/2021 0901   HDL 67.20 05/02/2021 0901   CHOLHDL 3 05/02/2021 0901   VLDL 11.4 05/02/2021 0901   LDLCALC 122 (H) 05/02/2021 0901     Wt Readings from Last 3 Encounters:  11/14/21 178 lb (80.7 kg)  10/14/21 175 lb (79.4 kg)  10/05/21 179 lb (81.2 kg)      Other studies Reviewed: Additional studies/ records that were reviewed today include: TTE 01/18/21  Review of the above records today demonstrates:   1. The aortic valve is calcified. Aortic valve regurgitation is mild to  moderate. Mild to moderate aortic  valve sclerosis/calcification is  present, without any evidence of aortic stenosis and stroke volume index  of 38.   2. Left ventricular ejection fraction, by estimation, is 60 to 65%. The  left ventricle has normal function. The left ventricle has no regional  wall motion abnormalities. Left ventricular diastolic parameters are  indeterminate due to atrial flutter.   3. Right ventricular systolic function is normal. The right ventricular  size is normal. There is normal pulmonary artery systolic pressure. The  estimated right ventricular systolic pressure is 35.3 mmHg.   4. Left atrial size was mildly dilated.   5. The mitral valve is abnormal. Mild mitral valve regurgitation. The  mean mitral valve gradient is 2.0 mmHg with average heart rate of 103 bpm.   6. The inferior vena cava is normal in  size with greater than 50%  respiratory variability, suggesting right atrial pressure of 3 mmHg.  Cardiac monitor 12/12/2020 personally reviewed Predominant rhythm was atrial flutter with average heart rate 92bpm and ranged from 48 to 172bpm. No sinus rhythm was present. Occasional PVCs were present  ASSESSMENT AND PLAN:  1.  Typical atrial flutter: CHA2DS2-VASc of 3.  Currently on Eliquis 5 mg twice daily.  Status post ablation 10/14/2021.  Fortunately remains in sinus rhythm.  She has had no further episodes of atrial flutter.  She is overall happy with her control.  We Izaih Kataoka stop both diltiazem and Eliquis today.   Current medicines are reviewed at length with the patient today.   The patient does not have concerns regarding her medicines.  The following changes were made today: Stop diltiazem and Eliquis  Labs/ tests ordered today include:  Orders Placed This Encounter  Procedures   EKG 12-Lead     Disposition:   FU with Bobbiejo Ishikawa as needed months  Signed, Brienne Liguori Meredith Leeds, MD  11/14/2021 11:03 AM     [facility] [address] [place]  [address] [phone] (office) [phone] (fax)

## 2021-11-14 NOTE — Patient Instructions (Signed)
Medication Instructions:  Your physician has recommended you make the following change in your medication:  STOP Diltiazem STOP Eliquis  *If you need a refill on your cardiac medications before your next appointment, please call your pharmacy*   Lab Work: None ordered   Testing/Procedures: None ordered   Follow-Up: At [facility], you and your health needs are our priority.  As part of our continuing mission to provide you with exceptional heart care, we have created designated Provider Care Teams.  These Care Teams include your primary Cardiologist (physician) and Advanced Practice Providers (APPs -  Physician Assistants and Nurse Practitioners) who all work together to provide you with the care you need, when you need it.  Your next appointment:   As needed     The format for your next appointment:   In Person  Provider:   Allegra Lai, MD    Thank you for choosing [facility]!!   Trinidad Curet, RN [phone]  Other Instructions   Important Information About Sugar          .i

## 2021-11-28 ENCOUNTER — Telehealth: Payer: Self-pay | Admitting: Pharmacist

## 2021-11-28 NOTE — Progress Notes (Addendum)
Chronic Care Management Pharmacy Assistant   Name: Ranada San Marino  MRN: [id number] DOB: May 01, 1940   Reason for Encounter: General Adherence Call    Recent office visits:  None  Recent consult visits:  11/14/2021 OV (Cardiology) Constance Haw, MD; We will stop both diltiazem and Eliquis today.  10/05/2021 OV (Cardiology) Tobb, Kardie, DO; no medication changes indicated.  Hospital visits:  10/14/2021 Procedure visit for A-flutter Ablation  Medications: Outpatient Encounter Medications as of 11/28/2021  Medication Sig   atorvastatin (LIPITOR) 10 MG tablet TAKE 1 TABLET BY MOUTH EVERYDAY AT BEDTIME   citalopram (CELEXA) 20 MG tablet Take 1 tablet (20 mg total) by mouth daily.   gabapentin (NEURONTIN) 600 MG tablet Take 0.5 tablets (300 mg total) by mouth at bedtime.   levothyroxine (SYNTHROID) 100 MCG tablet Take 1 tablet (100 mcg total) by mouth daily. (Patient taking differently: Take 100-150 mcg by mouth See admin instructions. Mon- Sat 100 mcg daily, and Sun 150 mcg daily)   Multiple Vitamins-Minerals (PRESERVISION AREDS PO) Take 1 capsule by mouth in the morning and at bedtime.   omeprazole (PRILOSEC) 20 MG capsule TAKE 1 CAPSULE BY MOUTH EVERY DAY   oxybutynin (DITROPAN-XL) 10 MG 24 hr tablet TAKE 1 TABLET BY MOUTH AT  BEDTIME   Polyethyl Glycol-Propyl Glycol (SYSTANE OP) Place 1 drop into both eyes daily as needed (DRY EYES).   traMADol (ULTRAM) 50 MG tablet TAKE 1 TABLET BY MOUTH 3 TIMES DAILY AS NEEDED.   No facility-administered encounter medications on file as of 11/28/2021.   Contacted Althia San Marino for [employer] Review Call   Chart Review:  Have there been any documented new, changed, or discontinued medications since last visit? Yes - patient was discontinued from Eliquis and Diltiazem.  Has there been any documented recent hospitalizations or ED visits since last visit with Clinical Pharmacist? Yes Brief Summary: Patient had an ablation on 10/14/2021 and was  discontinued from Eliquis and Diltiazem. Patient states she feels much better since stopping said medications.   Adherence Review:  Does the Clinical Pharmacist Assistant have access to adherence rates? Yes Adherence rates for STAR metric medications (List medication(s)/day supply/ last 2 fill dates). Adherence rates for medications indicated for disease state being reviewed (List medication(s)/day supply/ last 2 fill dates). Does the patient have >5 day gap between last estimated fill dates for any of the above medications or other medication gaps? No Reason for medication gaps.   Disease State Questions:  Able to connect with Patient? Yes Did patient have any problems with their health recently? No Have you had any admissions or emergency room visits or worsening of your condition(s) since last visit? No Have you had any visits with new specialists or providers since your last visit? No Have you had any new health care problem(s) since your last visit? No Have you run out of any of your medications since you last spoke with clinical pharmacist? No Are there any medications you are not taking as prescribed? No Are you having any issues or side effects with your medications? No Do you have any other health concerns or questions you want to discuss with your Clinical Pharmacist before your next visit? No Are there any health concerns that you feel we can do a better job addressing? No Are you having any problems with any of the following since the last visit: (select all that apply)  None 12. Any falls since last visit? No 13. Any increased or uncontrolled pain since last visit?  No  -Patient scheduled a follow up telephone visit with Clinical Pharmacist on 02/20/2022.  Care Gaps: Medicare Annual Wellness: Completed 06/09/2021 Hemoglobin A1C: none available Colonoscopy: Aged out Dexa Scan: Next due on 08/18/2023 Mammogram: Next due on 05/05/2022  Future Appointments  Date Time  Provider [place]  02/20/2022  8:30 AM [facility] CCM PHARMACIST [facility]  04/10/2022 10:20 AM Tobb, Kardie, DO [facility]  06/22/2022  8:45 AM [facility] HEALTH COACH [facility]   Star Rating Drugs: Atorvastatin 10 mg last filled 10/02/2021 90 DS  April D Calhoun, [place] Pharmacist Assistant [phone]

## 2021-12-03 ENCOUNTER — Other Ambulatory Visit: Payer: Self-pay | Admitting: Family Medicine

## 2021-12-14 ENCOUNTER — Ambulatory Visit (INDEPENDENT_AMBULATORY_CARE_PROVIDER_SITE_OTHER): Payer: Medicare Other | Admitting: Family Medicine

## 2021-12-14 ENCOUNTER — Encounter: Payer: Self-pay | Admitting: Family Medicine

## 2021-12-14 VITALS — BP 122/50 | HR 65 | Temp 98.6°F | Ht 60.0 in | Wt 177.6 lb

## 2021-12-14 DIAGNOSIS — G8929 Other chronic pain: Secondary | ICD-10-CM | POA: Diagnosis not present

## 2021-12-14 DIAGNOSIS — M47812 Spondylosis without myelopathy or radiculopathy, cervical region: Secondary | ICD-10-CM | POA: Insufficient documentation

## 2021-12-14 DIAGNOSIS — R519 Headache, unspecified: Secondary | ICD-10-CM | POA: Diagnosis not present

## 2021-12-14 DIAGNOSIS — M542 Cervicalgia: Secondary | ICD-10-CM

## 2021-12-14 MED ORDER — GABAPENTIN 100 MG PO CAPS: ORAL_CAPSULE | ORAL | 0 refills | Status: DC

## 2021-12-14 MED ORDER — LEVOTHYROXINE SODIUM 100 MCG PO TABS: ORAL_TABLET | ORAL | Status: DC

## 2021-12-14 NOTE — Progress Notes (Signed)
Subjective  CC:  Chief Complaint  Patient presents with   Headache    Pt stated they she has been experience headaches for the past 1mo that comes and goes.     HPI: Martha Gilmore a 81y.o. female who presents to the office today to address the problems listed above in the chief complaint. Returns due to persistent neck pain and now with left sided recurring headaches.  See my last note 02/2021 for same. I reviewed NS and sports med notes again.  Pt now on bid or tid tramadol to manage pain which she says help her. No new sxs but wants to better understand what is wrong with her neck and if anything can be done to improve it. She saw ns and had injections which were not beneficial. However, she doesn't feel that she has had an adequate answer about dx and prognosis.  Now also with 2-4x/week left sided headache, left occiput, described as scalp soreness. Will last minutes to hours. No other pattern identified. Mild tingling. No associated n/v, photophobia, pain with brushing hair or chewing. No visual disturbances. ? If coming from neck. We have her on gabapentin 300 qhs and this has helped some of the sharp, stabbing pains that she was experiencing. She has had a nl brain MRI not too long ago. No neuro deficits.  She is tired from her chronic pain.    Assessment  1. Arthropathy of cervical facet joint   2. Spondylosis, cervical   3. Frequent headaches   4. Chronic neck pain      Plan  Chronic neck pain:  endstage cervical DJD? Rec f/u with sports med again to get further clarification and to see if anything else can be done. Has seen rheum as well. Continue tid tramadol. May need to consider pain medication escalation or pain mgt if can't keep controlled and not a candidate for other therapies. Will titrate up gabepentin dose. See below Headaches: sound neuropathic from neck. Will treat with gabapentin tid. No red flag sxs.   Follow up: as scheduled.  02/20/2022  No orders of  the defined types were placed in this encounter.  Meds ordered this encounter  Medications   levothyroxine (SYNTHROID) 100 MCG tablet    Sig: Mon- Sat 100 mcg daily, and Sun 150 mcg daily   gabapentin (NEURONTIN) 100 MG capsule    Sig: Take 1 capsule (100 mg total) by mouth 2 (two) times daily for 14 days, THEN 2 capsules (200 mg total) 2 (two) times daily for 14 days, THEN 3 capsules (300 mg total) 2 (two) times daily.    Dispense:  90 capsule    Refill:  0      I reviewed the patients updated PMH, FH, and SocHx.    Patient Active Problem List   Diagnosis Date Noted   Obstructive sleep apnea 03/28/2021    Priority: High   [place] 12/30/[place]    Priority: High   Postablative hypothyroidism 12/20/2015    Priority: High   Chondromalacia, knee, right 11/05/2017    Priority: Medium    Osteoarthrosis, localized, primary, knee, right 11/05/2017    Priority: Medium    [place] 04/16/2017    Priority: Medium    Mixed hyperlipidemia 04/16/2017    Priority: Medium    History of Graves' disease 03/22/2015    Priority: Medium    GERD (gastroesophageal reflux disease) 12/13/2012    Priority: Medium    Osteopenia 08/05/2012  Priority: Medium    DDD (degenerative disc disease), lumbosacral 10/22/2010    Priority: Medium    Nonrheumatic aortic valve insufficiency 04/07/2021    Priority: Low   Bilateral sensorineural hearing loss 02/06/2016    Priority: Low   OAB (overactive bladder) 12/16/2013    Priority: Low   Osteoarthritis of CMC joint of thumb 08/13/2012    Priority: Low   Seasonal allergies 08/13/2012    Priority: Low   Urge and stress incontinence 08/13/2012    Priority: Low   Chronic neck pain 12/14/2021   Arthropathy of cervical facet joint 12/14/2021   Spondylosis, cervical 12/14/2021   Current use of long term anticoagulation 08/19/2021   Primary hypertension 07/04/2021   Obesity (BMI 30-39.9) 07/04/2021    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   gabapentin (NEURONTIN) 100 MG capsule Take 1 capsule (100 mg total) by mouth 2 (two) times daily for 14 days, THEN 2 capsules (200 mg total) 2 (two) times daily for 14 days, THEN 3 capsules (300 mg total) 2 (two) times daily.   gabapentin (NEURONTIN) 600 MG tablet TAKE 1/2 TABLET BY MOUTH AT BEDTIME.   Multiple Vitamins-Minerals (PRESERVISION AREDS PO) Take 1 capsule by mouth in the morning and at bedtime.   omeprazole (PRILOSEC) 20 MG capsule TAKE 1 CAPSULE BY MOUTH EVERY DAY   oxybutynin (DITROPAN-XL) 10 MG 24 hr tablet TAKE 1 TABLET BY MOUTH AT  BEDTIME   Polyethyl Glycol-Propyl Glycol (SYSTANE OP) Place 1 drop into both eyes daily as needed (DRY EYES).   traMADol (ULTRAM) 50 MG tablet TAKE 1 TABLET BY MOUTH 3 TIMES DAILY AS NEEDED.   [DISCONTINUED] levothyroxine (SYNTHROID) 100 MCG tablet Take 1 tablet (100 mcg total) by mouth daily. (Patient taking differently: Take 100-150 mcg by mouth See admin instructions. Mon- Sat 100 mcg daily, and Sun 150 mcg daily)    Allergies: Patient is allergic to aspirin, demerol  [meperidine hcl], penicillins, statins, cephalexin, clindamycin/lincomycin, and tape. Family History: Patient family history includes Brain cancer in her brother; Cancer in her father; Diabetes in her sister; Heart disease in her father, mother, and sister; Hypertension in her mother; Kidney cancer in her sister; Lung cancer in her sister. Social History:  Patient  reports that she has never smoked. She has never used smokeless tobacco.  She reports that she does not drink alcohol and does not use drugs.  Review of Systems: Constitutional: Negative for fever malaise or anorexia Cardiovascular: negative for chest pain Respiratory: negative for SOB or persistent cough Gastrointestinal: negative for abdominal pain  Objective  Vitals: BP (!) 122/50   Pulse 65   Temp 98.6 F (37 C)   Ht 5' (1.524 m)   Wt 177 lb 9.6 oz (80.6 kg)   SpO2 98%   BMI 34.69 kg/m  General: no acute distress , A&Ox3 HEENT: neck with limited rom    Commons side effects, risks, benefits, and alternatives for medications and treatment plan prescribed today were discussed, and the patient expressed understanding of the given instructions. Patient is instructed to call or message via MyChart if he/she has any questions or concerns  regarding our treatment plan. No barriers to understanding were identified. We discussed Red Flag symptoms and signs in detail. Patient expressed understanding regarding what to do in case of urgent or emergency type symptoms.  Medication list was reconciled, printed and provided to the patient in AVS. Patient instructions and summary information was reviewed with the patient as documented in the AVS. This note was prepared with assistance of Dragon voice recognition software. Occasional wrong-word or sound-a-like substitutions may have occurred due to the inherent limitations of voice recognition software  This visit occurred during the SARS-CoV-2 public health emergency.  Safety protocols were in place, including screening questions prior to the visit, additional usage of staff PPE, and extensive cleaning of exam room while observing appropriate contact time as indicated for disinfecting solutions.

## 2021-12-14 NOTE — Patient Instructions (Signed)
Please return in January for your complete physical.   I have ordered gabapentin '100mg'$ : take as prescribed on bottle. I'd like to get you to a dose of '300mg'$  3x/day to see if it helps your pain and headaches.   Please schedule an appointment with Dr. [name] to see if has any other ideas to help you with your neck pain.   If you have any questions or concerns, please don't hesitate to send me a message via MyChart or call the office at [phone]. Thank you for visiting with Martha Gilmore today! It's our pleasure caring for you.

## 2021-12-15 ENCOUNTER — Ambulatory Visit: Payer: Medicare Other | Admitting: Family Medicine

## 2021-12-15 VITALS — BP 152/82 | HR 60 | Ht 60.0 in | Wt 177.6 lb

## 2021-12-15 DIAGNOSIS — G8929 Other chronic pain: Secondary | ICD-10-CM

## 2021-12-15 DIAGNOSIS — M47812 Spondylosis without myelopathy or radiculopathy, cervical region: Secondary | ICD-10-CM | POA: Diagnosis not present

## 2021-12-15 DIAGNOSIS — M542 Cervicalgia: Secondary | ICD-10-CM | POA: Diagnosis not present

## 2021-12-15 NOTE — Patient Instructions (Addendum)
Thank you for coming in today.   Cognitive Behavioral Therapy  for Chronic Pain  I recommend the following book.   The Pain Management Workbook: Powerful CBT and Mindfulness Skills to Take Control of Pain and Reclaim Your Life by Charlaine Dalton MS PhD   https://www.schmidt.com/  https://www.zoffness.com/

## 2021-12-15 NOTE — Progress Notes (Signed)
I, Peterson Lombard, LAT, ATC acting as a scribe for Martha Leader, MD.  Martha Gilmore is a 81 y.o. female who presents to [place] at [facility] today for cont'd neck pain. Pt was last seen by Dr. [name] on 04/20/21 for R hip pain. She was last seen for her neck pain on 09/27/20 and a rheumatologic work-up was obtained and she was prescribed Cymbalta. Dr. Ernestina Patches was consulted about doing L C1-2 facet injections, but felt it was unadvisable. Pt was then referred to Dr. Lenord Carbo. Today, pt wants to see if this is a good as her neck is going to feel. Pt notes they tried 2 injections in her neck w/o much benefit. Pt is taking tramadol tid w/ Tylenol and Gabapentin. This helps some but she continues to experience chronic pain.  Dx imaging: 02/24/21 C-spine MRI 08/17/20 C-spine MRI             07/08/20 C-spine XR  Pertinent review of systems: No fevers or chills  Relevant historical information: Heart disease.  Facet arthritis C-spine.   Exam:  BP (!) 152/82   Pulse 60   Ht 5' (1.524 m)   Wt 177 lb 9.6 oz (80.6 kg)   SpO2 97%   BMI 34.69 kg/m  General: Well Developed, well nourished, and in no acute distress.   MSK: C-spine: Decreased cervical motion.    Lab and Radiology Results EXAM: MRI CERVICAL SPINE WITHOUT CONTRAST   TECHNIQUE: Multiplanar, multisequence MR imaging of the cervical spine was performed. No intravenous contrast was administered.   COMPARISON:  MRI of the cervical spine August 17, 2020   FINDINGS: Alignment: Mild anterolisthesis at C3-4, C5-6, C6-7 and C7-T1.   Vertebrae: Interval improvement with mild residual marrow edema at C1 and C2 and C7-T1 right facet articular processes. No new areas of marrow edema identified. No evidence of discitis or aggressive bone lesion. Facet fusion and incomplete endplate fusion noted from C4 through C7. Endplate fusion is also noted at T1-2.   Cord: Normal signal and morphology.   Posterior Fossa,  vertebral arteries, paraspinal tissues: Negative.   Disc levels:   C1-2: Increased thickness of the soft tissues posterior to the odontoid. Facet osteoarthritis resulting in narrowing of the bilateral neural foramen, left greater than right.   C2-3: Small posterior disc protrusion without significant spinal canal stenosis. Facet degenerative changes resulting in mild left neural foraminal narrowing.   C3-4: Small posterior disc protrusion without significant spinal canal stenosis. Uncovertebral and hypertrophic facet degenerative changes resulting in moderate right and mild left neural foraminal narrowing.   C4-5: Hypertrophic facet degenerative changes. No significant spinal canal or neural foraminal stenosis.   C5-6: Posterior disc osteophyte complex and facet ankylosis. No significant spinal canal or neural foraminal stenosis.   C6-7: Posterior disc osteophyte complex and facet ankylosis. No significant spinal canal or neural foraminal stenosis.   C7-T1: Posterior disc osteophyte complex and facet degenerative changes resulting in mild left neural foraminal narrowing.   IMPRESSION: Interval improvement of the marrow edema involving C1 and C2 and right C7-T1 facet joint with mild residual edema. Otherwise, stable spondylosis when compared to prior MRI.     Electronically Signed   By: Pedro Earls M.D.   On: 02/25/2021 16:20 I, Martha Gilmore, personally (independently) visualized and performed the interpretation of the images attached in this note.     Assessment and Plan: 81 y.o. female with chronic pain from cervical spine more specifically from left C1  and C2 facet arthritis.  Additionally she likely has some occipital neuralgia type pain and muscle spasm and dysfunction as well.  Unfortunately she is pretty well optimized from a physical medicine standpoint.  She had trials of injection which unfortunately were not as effective as we would like or could  not be done at the level where her pain actually occurs due to safety.  Her medication regimen is very well optimized by her primary care provider including reasonable tramadol and gabapentin doses.  He is already had a trial of duloxetine.  We could try a few other medicines here and there but I am not optimistic that further adjustment of her medicine is going to significantly improve her overall pain.  The main gap in her chronic pain regimen is cognitive behavioral therapy.  There is good evidence based medicine to expect that cognitive behavioral therapy could improve chronic pain experience.  Unfortunately it is challenging to access.  After discussion and explanation I recommended a good book for self-guided cognitive behavioral therapy.  Dr. Charlaine Dalton pain management workbook.   She will let me know how it goes.  I am optimistic that she will have some improvement in her overall experience of chronic pain.    Discussed warning signs or symptoms. Please see discharge instructions. Patient expresses understanding.   The above documentation has been reviewed and is accurate and complete Martha Gilmore, M.D.  Total encounter time 30 minutes including face-to-face time with the patient and, reviewing past medical record, and charting on the date of service.

## 2021-12-29 ENCOUNTER — Encounter: Payer: Self-pay | Admitting: Family Medicine

## 2022-01-02 MED ORDER — GABAPENTIN 300 MG PO CAPS: 300.0000 mg | ORAL_CAPSULE | Freq: Two times a day (BID) | ORAL | 3 refills | Status: DC

## 2022-01-02 NOTE — Addendum Note (Signed)
Addended by: Billey Chang on: 01/02/2022 08:34 AM   Modules accepted: Orders

## 2022-01-03 ENCOUNTER — Encounter: Payer: Self-pay | Admitting: Family Medicine

## 2022-01-04 ENCOUNTER — Other Ambulatory Visit: Payer: Self-pay | Admitting: Family Medicine

## 2022-01-06 ENCOUNTER — Other Ambulatory Visit: Payer: Self-pay | Admitting: Family Medicine

## 2022-01-10 ENCOUNTER — Ambulatory Visit: Payer: Medicare Other | Admitting: Cardiology

## 2022-02-06 NOTE — Progress Notes (Signed)
Chronic Care Management Pharmacy Note  02/06/2022 Name:  Martha Gilmore MRN:  [id number] DOB:  1941/01/16  Subjective: Martha Gilmore is an 81 y.o. year old female who is a primary patient of Leamon Arnt, MD.  The CCM team was consulted for assistance with disease management and care coordination needs.    Engaged with patient by telephone for follow up visit in response to provider referral for pharmacy case management and/or care coordination services.   Consent to Services:  The patient was given the following information about Chronic Care Management services today, agreed to services, and gave verbal consent: 1. CCM service includes personalized support from designated clinical staff supervised by the primary care provider, including individualized plan of care and coordination with other care providers 2. 24/7 contact phone numbers for assistance for urgent and routine care needs. 3. Service will only be billed when office clinical staff spend 20 minutes or more in a month to coordinate care. 4. Only one practitioner may furnish and bill the service in a calendar month. 5.The patient may stop CCM services at any time (effective at the end of the month) by phone call to the office staff. 6. The patient will be responsible for cost sharing (co-pay) of up to 20% of the service fee (after annual deductible is met). Patient agreed to services and consent obtained.  Patient Care Team: Leamon Arnt, MD as PCP - General (Family Medicine) Berniece Salines, DO as PCP - Cardiology (Cardiology) Constance Haw, MD as PCP - Electrophysiology (Cardiology) Susette Racer, MD as Consulting Physician (Endocrinology) Carol Ada, MD as Consulting Physician (Gastroenterology) Roseanne Kaufman, MD as Consulting Physician (Orthopedic Surgery) Rolm Bookbinder, MD as Consulting Physician (Dermatology) Stephannie Li, OD as Consulting Physician (Ophthalmology) [facility]  as Consulting Physician (Audiology) Madelin Rear, [facility] (Inactive) as Pharmacist (Pharmacist) Reece Agar, MD as Consulting Physician (Pain Medicine)  Recent office visits:  04/29/20- Billey Chang, MD- wellness and chronic disease management visit, imaging ordered, follow up 12 months   Recent consult visits:  07/29/20 [name], MD(Sports Medicine)- mri scheduled for unimproved neck pain, no medication changes 07/08/20-  [name], MD(Sports Medicine)- neck pain, physical therapy planned, follow up 6 weeks  Objective:  Lab Results  Component Value Date   CREATININE 0.91 10/07/2021   CREATININE 0.94 05/02/2021   CREATININE 0.90 04/04/2021    No results found for: "HGBA1C" Last diabetic Eye exam: No results found for: "HMDIABEYEEXA"  Last diabetic Foot exam: No results found for: "HMDIABFOOTEX"      Component Value Date/Time   CHOL 200 05/02/2021 0901   TRIG 57.0 05/02/2021 0901   HDL 67.20 05/02/2021 0901   CHOLHDL 3 05/02/2021 0901   VLDL 11.4 05/02/2021 0901   LDLCALC 122 (H) 05/02/2021 0901       Latest Ref Rng & Units 05/02/2021    9:01 AM 11/26/2020   10:19 AM 04/29/2020    9:05 AM  Hepatic Function  Total Protein 6.0 - 8.3 g/dL 6.9  6.8  6.9   Albumin 3.5 - 5.2 g/dL 4.0  4.0  4.3   AST 0 - 37 U/L _0 ALT 0 - 35 U/L _1 Alk Phosphatase 39 - 117 U/L 89  98  88   Total Bilirubin 0.2 - 1.2 mg/dL 0.8  0.5  0.7     Lab Results  Component Value Date/Time   TSH 6.68 (H)  05/02/2021 09:01 AM   TSH 1.10 11/26/2020 10:19 AM       Latest Ref Rng & Units 10/07/2021   11:13 AM 05/02/2021    9:01 AM 04/04/2021   12:39 PM  CBC  WBC 3.4 - 10.8 x10E3/uL 6.4  7.9  11.3   Hemoglobin 11.1 - 15.9 g/dL 12.5  13.2  13.0   Hematocrit 34.0 - 46.6 % 37.4  40.5  40.0   Platelets 150 - 450 x10E3/uL 243  272.0  365     No results found for: "VD25OH"  Clinical ASCVD: No  The ASCVD Risk score (Arnett DK, et al., 2019) failed to calculate for the following  reasons:   The 2019 ASCVD risk score is only valid for ages 7 to 73    Other: (CHADS2VASc if Afib, PHQ9 if depression, MMRC or CAT for COPD, ACT, DEXA)  Social History   Tobacco Use  Smoking Status Never  Smokeless Tobacco Never   BP Readings from Last 3 Encounters:  12/15/21 (!) 152/82  12/14/21 (!) 122/50  11/14/21 122/70   Pulse Readings from Last 3 Encounters:  12/15/21 60  12/14/21 65  11/14/21 61   Wt Readings from Last 3 Encounters:  12/15/21 177 lb 9.6 oz (80.6 kg)  12/14/21 177 lb 9.6 oz (80.6 kg)  11/14/21 178 lb (80.7 kg)    Assessment: Review of patient past medical history, allergies, medications, health status, including review of consultants reports, laboratory and other test data, was performed as part of comprehensive evaluation and provision of chronic care management services.   SDOH:  (Social Determinants of Health) assessments and interventions performed: Yes. SDOH Interventions    Flowsheet Row Clinical Support from 02/28/2018 in [place]  SDOH Interventions   Depression Interventions/Treatment  Currently on Treatment      CCM Care Plan  Allergies  Allergen Reactions   Aspirin Itching   Demerol  [Meperidine Hcl] Other (See Comments)    hallucinations   Penicillins Other (See Comments) and Rash    Other Reaction: RASH & HIVES    Statins Itching   Cephalexin Other (See Comments) and Rash    Other Reaction: RASH & ITCHING    Clindamycin/Lincomycin Other (See Comments) and Rash    Other Reaction: RASH & ITCHING    Tape Rash    Paper tape is OK     Medications Reviewed Today     Reviewed by Mare Ferrari (Technician) on 12/15/21 at 1321  Med List Status: <None>   Medication Order Taking? Sig Documenting Provider Last Dose Status Informant  atorvastatin (LIPITOR) 10 MG tablet [id number] No TAKE 1 TABLET BY MOUTH EVERYDAY AT BEDTIME Leamon Arnt, MD Taking Active Self  citalopram (CELEXA)  20 MG tablet [id number] No Take 1 tablet (20 mg total) by mouth daily. Leamon Arnt, MD Taking Active Self  gabapentin (NEURONTIN) 100 MG capsule [id number]  Take 1 capsule (100 mg total) by mouth 2 (two) times daily for 14 days, THEN 2 capsules (200 mg total) 2 (two) times daily for 14 days, THEN 3 capsules (300 mg total) 2 (two) times daily. Leamon Arnt, MD  Active   gabapentin (NEURONTIN) 600 MG tablet [id number] No TAKE 1/2 TABLET BY MOUTH AT BEDTIME. Leamon Arnt, MD Taking Active   levothyroxine (SYNTHROID) 100 MCG tablet [id number]  Mon- Sat 100 mcg daily, and Sun 150 mcg daily Leamon Arnt, MD  Active   Multiple Vitamins-Minerals (PRESERVISION AREDS PO) [id number] No Take  1 capsule by mouth in the morning and at bedtime. [provider] Taking Active Self  omeprazole (PRILOSEC) 20 MG capsule [id number] No TAKE 1 CAPSULE BY MOUTH EVERY DAY Leamon Arnt, MD Taking Active Self  oxybutynin (DITROPAN-XL) 10 MG 24 hr tablet [id number] No TAKE 1 TABLET BY MOUTH AT  BEDTIME Leamon Arnt, MD Taking Active Self  Polyethyl Glycol-Propyl Glycol (SYSTANE OP) [id number] No Place 1 drop into both eyes daily as needed (DRY EYES). [provider] Taking Active Self  traMADol (ULTRAM) 50 MG tablet [id number] No TAKE 1 TABLET BY MOUTH 3 TIMES DAILY AS NEEDED. 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 ([place]) 05/02/2021   Nonrheumatic aortic valve insufficiency 04/07/2021   Obstructive sleep apnea 03/28/2021   Aortic insufficiency 01/18/2021   Atrial flutter ([place]) 12/30/2020   Positive ANA (antinuclear antibody)  10/26/2020   Chondromalacia, knee, right 11/05/2017   Osteoarthrosis, localized, primary, knee, right 11/05/2017   Major depression, recurrent, chronic ([place]) 04/16/2017   Mixed hyperlipidemia 04/16/2017   Clostridium difficile infection 04/16/2017   Family history of early CAD 04/16/2017   Bilateral sensorineural hearing loss 02/06/2016   Postablative hypothyroidism 12/20/2015   History of Graves' disease 03/22/2015   OAB (overactive bladder) 12/16/2013   GERD (gastroesophageal reflux disease) 12/13/2012   Osteoarthritis of CMC joint of thumb 08/13/2012   Seasonal allergies 08/13/2012   Urge and stress incontinence 08/13/2012   Osteopenia 08/05/2012   DDD (degenerative disc disease), lumbosacral 10/22/2010    Immunization History  Administered Date(s) Administered   Fluad Quad(high Dose 65+) 01/13/2021, 12/28/2021   Influenza Split 01/15/2012   Influenza, High Dose Seasonal PF 01/30/2013, 01/05/2015, 02/17/2016, 02/02/2017, 01/18/2018, 12/11/2018, 12/23/2019, 01/13/2021   Influenza, Seasonal, Injecte, Preservative Fre 01/21/2014   Influenza-Unspecified 01/09/2011, 01/15/2012, 01/21/2014, 01/22/2018   PFIZER(Purple Top)SARS-COV-2 Vaccination 05/15/2019, 06/05/2019, 01/20/2020, 08/16/2020   Pfizer Covid-19 Vaccine Bivalent Booster 93yr & up 01/14/2021   Pneumococcal Conjugate-13 01/05/2015   Pneumococcal Polysaccharide-23 04/24/2006, 11/18/2007, 05/30/2012   Tdap 08/17/2010   Zoster Recombinat (Shingrix) 06/29/2018, 11/01/2018   Zoster, Live 04/25/1995    Conditions to be addressed/monitored: Osteopenia, OA, GERD, MDD, Graves' Disease, OAB, HLD , family Hx of early CAD  There are no care plans that you recently modified to display for this patient.   Patient's [facility] pharmacy [facility]:  [id number] [id number]: [phone]: [phone] Appointments  Date Time Provider [place] 02/20/2022  8:30 AM [facility] CCM PHARMACIST [facility]  04/10/2022 10:20 AM Tobb, Kardie, DO [facility] None  05/03/2022  8:30 AM AnLeamon ArntMD [facility]  06/22/2022  8:45 AM [facility] HEALTH COACH [facility]   Follow Up:  Patient agrees to Care Plan and Follow-up.  ChBeverly MilchPharmD Clinical Pharmacist  [phone]   Current Barriers:  Potential opportunity to reduce number of medications w/ PPI taper, OAB alternative due to anticholinergic properties and dry mouth symptoms  Pharmacist Clinical Goal(s):  Patient will contact provider office for questions/concerns as evidenced notation of same  in electronic health record through collaboration with PharmD and provider.   Interventions: 1:1 collaboration with Leamon Arnt, MD regarding development and update of comprehensive plan of care as evidenced by provider attestation and co-signature Inter-disciplinary care team collaboration (see longitudinal plan of care) Comprehensive medication review performed; medication list updated in electronic medical record  Hyperlipidemia: (LDL goal < 100) -Not ideally controlled -Current treatment: Atorvastatin 10 mg once daily -Tolerating without issue -Current dietary patterns: on noom program - feels is more mindful of diet now. Doesn't add a lot of salt. -Current exercise habits: nothing formal -Educated on Cholesterol goals;  Benefits of statin for ASCVD risk reduction; -Recommended to continue current medication  GERD (Goal: ensure necessary treatment) -Controlled -Understands triggers to be spicy, tomato, carbonated beverages. Feels reflux has been very well controlled. Is open to trying taper but wants to hold off until back from vacation and revisit early June  -Current treatment  Omeprazole 20 mg once daily -Potential triggers reviewed -Open to trying PPI taper - will discuss w/ pcp as appropriate 09/2020. -Recommended to  continue current medication unless directed otherwise  OAB (Goal: minimize pain, ensure medication safety) -Controlled  -Mentions ongoing dry mouth symptoms -Current treatment  Oxybutynin XL 10 mg once daily  -Medications previously tried: n/a  -Assessed potential side effects - wants to discuss alternative agents June 2022  Patient Goals/Self-Care Activities Patient will:  - target a minimum of 150 minutes of moderate intensity exercise weekly  Follow Up Plan: RPh f/u telephone call 09/2020. Medication Assistance: None required.  Patient affirms current coverage meets needs.

## 2022-02-20 ENCOUNTER — Ambulatory Visit: Payer: Medicare Other | Admitting: Pharmacist

## 2022-02-20 DIAGNOSIS — E782 Mixed hyperlipidemia: Secondary | ICD-10-CM

## 2022-02-20 DIAGNOSIS — E890 Postprocedural hypothyroidism: Secondary | ICD-10-CM

## 2022-02-20 NOTE — Patient Instructions (Addendum)
Visit Information   Goals Addressed             This Visit's Progress    Increase physical activity   On track    Increase activity       Patient Care Plan: CCM Pharmacy Care Plan     Problem Identified: Osteopenia, OA, GERD, MDD, Graves' Disease, OAB, HLD, family Hx of early CAD   Priority: High     Long-Range Goal: Disease Management   Start Date: 08/17/2020  Expected End Date: 08/17/2021  Recent Progress: On track  Priority: High  Note:    Current Barriers:  Tingling in her head some points during the day  Pharmacist Clinical Goal(s):  Patient will contact provider office for questions/concerns as evidenced notation of same in electronic health record through collaboration with PharmD and provider.   Interventions: 1:1 collaboration with Leamon Arnt, MD regarding development and update of comprehensive plan of care as evidenced by provider attestation and co-signature Inter-disciplinary care team collaboration (see longitudinal plan of care) Comprehensive medication review performed; medication list updated in electronic medical record  Hyperlipidemia: (LDL goal < 100) 02/20/22 -Not ideally controlled, last LDL was above goal in January -Current treatment: Atorvastatin 10 mg once daily Appropriate, Query effective, , -Tolerating without issue -Current dietary patterns: same unchanced -Current exercise habits: nothing formal -Educated on Cholesterol goals;  Benefits of statin for ASCVD risk reduction; -Recommended to continue current medication -Confirmed she was taking statin, has upcoming physical with PCP in January.  If still elevated could consider increasing dose - she is tolerating current dose well. -FU after January visit  GERD (Goal: ensure necessary treatment) -Controlled -Understands triggers to be spicy, tomato, carbonated beverages. Feels reflux has been very well controlled. Is open to trying taper but wants to hold off until back from  vacation and revisit early June  -Current treatment  Omeprazole 20 mg once daily -Potential triggers reviewed -Open to trying PPI taper - will discuss w/ pcp as appropriate 09/2020. -Recommended to continue current medication unless directed otherwise  Hypothyroidism (Goal: Maintain TSH) 02/20/22 -Controlled -Current treatment  Levothtyroxine 147mg daily except 1525m on Sunday Appropriate, Effective, Safe, Accessible -Medications previously tried: none noted  -Recommended to continue current medication [facility] and it is now normal.She is taking appropriately with no adverse effects or concerns with administration.  OAB (Goal: minimize pain, ensure medication safety) -Controlled  -Mentions ongoing dry mouth symptoms -Current treatment  Oxybutynin XL 10 mg once daily  -Medications previously tried: n/a  -Assessed potential side effects - wants to discuss alternative agents June 2022  Patient Goals/Self-Care Activities Patient will:  - target a minimum of 150 minutes of moderate intensity exercise weekly  Follow Up Plan: RPh f/u telephone call 6 months Medication Assistance: None required.  Patient affirms current coverage meets needs.         The patient verbalized understanding of instructions, educational materials, and care plan provided today and DECLINED offer to receive copy of patient instructions, educational materials, and care plan.  Telephone follow up appointment with pharmacy team member scheduled for: 6 months  ChEdythe ClarityRPKnappPharmD Clinical Pharmacist  [phone]

## 2022-02-28 ENCOUNTER — Other Ambulatory Visit: Payer: Self-pay | Admitting: Family Medicine

## 2022-03-06 ENCOUNTER — Telehealth: Payer: Self-pay | Admitting: Family Medicine

## 2022-03-06 NOTE — Telephone Encounter (Signed)
Patient states: - On 11/11 she began experiencing headache and nausea  - Developed dizziness upon standing on 11/12 - Has a hx of ablation   Patient has been transferred to triage nurse.

## 2022-03-06 NOTE — Telephone Encounter (Signed)
Patient Name: Martha Gilmore Gender: Female DOB: 02/21/1941 Age: 81 Y 60 M Return Phone Number: [phone] (Primary), [phone] (Secondary) Address: City/ State/ Zip: [place]  [address] Client [facility] [place] Client Site [facility] [place] [facility] Provider Billey Chang- MD Contact Type Call Who Is Calling Patient / Member / Family / Caregiver Call Type Triage / Clinical Relationship To Patient Self Return Phone Number [phone] (Primary) Chief Complaint Dizziness Reason for Call Symptomatic / Request for [place] states pt has history of ablasion; 11/11 sat having headache and ; dizziness and nausea began yesterday when standing; tremors in head from arthritis in neck-left nerve up side of head; gabapentin isn't helping as much; Translation No Nurse Assessment Nurse: Vallery Sa, RN, Cathy Date/Time (Eastern Time): 03/06/2022 10:30:26 AM Confirm and document reason for call. If symptomatic, describe symptoms. ---Caller states that she developed a headache (no headache at this time) with neck pain (current pain rated as a 5), dizziness and nausea about 2 days ago (current pain rated as a 7 on the 1 to 10 scale). No fever. No injury in the past 3 days. No fast heart rate or skipped beats. No injury. Alert and responsive. Does the patient have any new or worsening symptoms? ---Yes Will a triage be completed? ---Yes Related visit to physician within the last 2 weeks? ---No Does the PT have any chronic conditions? (i.e. diabetes, asthma, this includes High risk factors for pregnancy, etc.) ---Yes List chronic conditions. ---Heart Ablation (June 2023), High Cholesterol, Arthritis Is this a behavioral health or substance abuse call? ---No Guidelines Guideline Title Affirmed Question Affirmed Notes Nurse Date/Time (Eastern Time) Dizziness - Lightheadedness [1]  MODERATE dizziness (e.g., Trumbull, RN, Tye [place] 03/06/2022 10:36:03 AM PLEASE NOTE: All timestamps contained within this report are represented as [place] Standard Time. CONFIDENTIALTY NOTICE: This fax transmission is intended only for the addressee. It contains information that is legally privileged, confidential or otherwise protected from use or disclosure. If you are not the intended recipient, you are strictly prohibited from reviewing, disclosing, copying using or disseminating any of this information or taking any action in reliance on or regarding this information. If you have received this fax in error, please notify us immediately by telephone so that we can arrange for its return to [place]. Phone: [phone], Toll-Free: [phone], Fax: [phone] Page: 2 of 3 Call Id: [id number] Guidelines Guideline Title Affirmed Question Affirmed Notes Nurse Date/Time Eilene Ghazi Time) interferes with normal activities) AND [2] has NOT been evaluated by doctor (or NP/PA) for this (Exception: Dizziness caused by heat exposure, sudden standing, or poor fluid intake.) Neck Pain or Stiffness Numbness in an arm or hand (i.e., loss of sensation) Vallery Sa, RN, [facility] 03/06/2022 10:40:15 AM Disp. Time Eilene Ghazi Time) Disposition Final User 03/06/2022 10:40:01 AM See PCP within 24 Hours Trumbull, RN, Tye [place] 03/06/2022 10:42:59 AM See PCP within 24 Hours Yes Vallery Sa, RN, Tye [place] Final Disposition 03/06/2022 10:42:59 AM See PCP within 24 Hours Yes Trumbull, RN, Rosey [place] Disagree/Comply Comply Caller Understands Yes PreDisposition Did not know what to do Care Advice Given Per Guideline SEE PCP WITHIN 24 HOURS: * IF OFFICE WILL BE OPEN: You need to be examined within the next 24 hours. Call your doctor (or NP/PA) when the office opens and make an appointment. DRINK FLUIDS: * Drink several glasses of fruit juice, other clear fluids or water. * This will improve hydration and blood glucose. LIE  DOWN AND REST: Shanda Howells  down with feet elevated for 1 hour. * This will improve circulation and increase blood flow to the brain. CALL BACK IF: * Passes out (faints) * You become worse CARE ADVICE given per Dizziness (Adult) guideline. SEE PCP WITHIN 24 HOURS: * IF OFFICE WILL BE OPEN: You need to be examined within the next 24 hours. Call your doctor (or NP/PA) when the office opens and make an appointment. PAIN MEDICINES: * For pain relief, you can take either acetaminophen, ibuprofen, or naproxen. * ACETAMINOPHEN - REGULAR STRENGTH TYLENOL: Take 650 mg (two 325 mg pills) by mouth every 4 to 6 hours as needed. Each Regular Strength Tylenol pill has 325 mg of acetaminophen. The most you should take is 10 pills a day (3,250 mg total). Note: In [place], the maximum is 12 pills a day (3,900 mg total). CALL BACK IF: * You become worse CARE ADVICE given per Neck Pain (Adult) guideline. PLEASE NOTE: All timestamps contained within this report are represented as [place] Standard Time. CONFIDENTIALTY NOTICE: This fax transmission is intended only for the addressee. It contains information that is legally privileged, confidential or otherwise protected from use or disclosure. If you are not the intended recipient, you are strictly prohibited from reviewing, disclosing, copying using or disseminating any of this information or taking any action in reliance on or regarding this information. If you have received this fax in error, please notify us immediately by telephone so that we can arrange for its return to [place]. Phone: [phone], Toll-Free: [phone], Fax: [phone] Page: 3 of 3 Call Id: [id number] Comments User: Jerrye Beavers Date/Time Eilene Ghazi Time): 03/06/2022 10:24:09 AM [place] provided info; Referrals REFERRED TO PCP OFFICE

## 2022-03-07 ENCOUNTER — Ambulatory Visit (INDEPENDENT_AMBULATORY_CARE_PROVIDER_SITE_OTHER): Payer: Medicare Other | Admitting: Internal Medicine

## 2022-03-07 ENCOUNTER — Ambulatory Visit: Payer: Medicare Other | Admitting: Internal Medicine

## 2022-03-07 ENCOUNTER — Encounter: Payer: Self-pay | Admitting: Internal Medicine

## 2022-03-07 VITALS — BP 145/80 | HR 69 | Temp 98.1°F | Resp 14 | Ht 60.0 in | Wt 178.4 lb

## 2022-03-07 DIAGNOSIS — I10 Essential (primary) hypertension: Secondary | ICD-10-CM

## 2022-03-07 DIAGNOSIS — R42 Dizziness and giddiness: Secondary | ICD-10-CM

## 2022-03-07 NOTE — Progress Notes (Signed)
[place] [facility] [employer]:  [phone]   Routine Medical Office Visit  Patient:  Martha Gilmore      Age: 81 y.o.       Sex:  female  Date:   03/07/2022  PCP:    Leamon Arnt, [place] Provider: Loralee Pacas, MD  Assessment/Plan:   Daziyah was seen today for dizziness, nausea and tremors in head.  Postural dizziness Overview: Denies any presyncope or syncope Had heart ablation in June Complete ros positive only for dizziness/fatigue as new symptoms (other positives were for chronic unchanged problems) Symptoms have been 2 days duration Symptoms are improving since 2 day(s) ago  Symptoms have completely resolved today.   Assessment & Plan: She reports that she follows with Dr. Quentin Ore and has had her thyroid checked in the last 4 to 5 months.  She reports that she has been taking Ditropan omeprazole and Celexa and I explained how each 1 of these medications could contribute to the postural dizziness.  Also she has recently increased her gabapentin and that possibly could have contributed as well.  However since the symptoms are completely gone now I recommend no medicine change or further work-up at this time but I will just report what happened to her primary care Dr. [name] and her endocrinologist Dr. Quentin Ore for their consideration at the next visit if perhaps work-up is desired or the problem continues to to return.  Because she did not pass out or have any heart racing or skipping beats I do not think full blood work-up is necessary at this time.  I did encourage her to go to the ER if she starts feeling like she is going to pass out if it comes back.  Also I encouraged her that if it comes back to try drinking salty fluid because that can raise the blood pressure and usually lower blood pressure is what is responsible for dizziness when you are standing up.  Urged her to keep getting PreserVision and discuss with her primary care if there is  potential to taper off some of the medications that might contribute in the future.   Primary hypertension Assessment & Plan: I encouraged her to take her blood pressure at home daily and bring it to a follow-up appointment with Dr. [name] next month.  I discouraged her from doing anything to lower her blood pressure right now because of the dizziness that she has been getting I do encourage weight loss.      Return in about 1 month (around 04/06/2022) for Follow-up with Dr. [name] about the transit and postural dizziness-hydration working? and bp.   Today's key discussion points - also in After Visit Summary (AVS)  Medication list was reconciled and patient instructions and summary information was documented and made available for her to review in the AVS (see AVS).  This note is also available to patient for review for accuracy and understanding. She was encouraged to contact our office by phone or message via MyChart if she has any questions or concerns regarding our treatment plan (see AVS).  We discussed red flag symptoms and signs in detail and when to call the office or go to ER if her condition worsens (see AFTER VISIT SUMMARY). She expressed understanding.  No barriers to understanding were identified. Go to ER if falls.    Subjective:   Martha Gilmore is a 81 y.o. female with PMH significant for: Past Medical History:  Diagnosis Date  .  Aortic insufficiency    mild to moderate by echo 12/2020  . Atrial flutter ([place]) 12/30/2020  . Cataracts, bilateral 09/23/2015  . Clostridium difficile infection 04/16/2017   H/o C.diff due to clindamicyin 09/2016; two rounds of treatment for cure. Dr. Benson Norway  . Depression   . Hyperlipidemia   . Hypertension   . OAB (overactive bladder) 12/16/2013  . Osteopenia 08/05/2012   Dexa scan 07/2012 mild osteopenia, T = -1.1  . Seasonal allergies   . Urge and stress incontinence      She is presenting today with: Chief Complaint  Patient presents  with  . Dizziness    Symptoms upon standing on Sunday. Had not ate much that day and overexerted herself. Thinks her medication has this side effect. Feels she is more dizzy upon standing and medication hasn't changed. After waiting a few minutes and moving this subsides.   . Nausea    Not currently.  . Tremors in head    Takes medication for this already, was worse on Sunday.      Additional physician-collected and physician-reviewed history: See Assessment/Plan section for today's problem updates to charted history (under Overview: and Assessment & Plan for each problem)  Denies any presyncope or syncope Had heart ablation in June Complete ros positive only for dizziness/fatigue as new symptoms (other positives were for chronic unchanged problems) Symptoms have been 2 days duration Symptoms are improving since 2 day(s) ago  Symptoms have completely resolved today.    Review of Systems  Constitutional:  Positive for malaise/fatigue. Negative for chills, diaphoresis, fever and weight loss.  HENT:  Positive for hearing loss (chronic). Negative for congestion, ear discharge, ear pain, nosebleeds, sinus pain, sore throat and tinnitus.   Eyes:  Negative for blurred vision, double vision, photophobia, pain, discharge and redness.  Respiratory:  Negative for cough, hemoptysis, sputum production, shortness of breath, wheezing and stridor.   Cardiovascular:  Negative for chest pain, palpitations, orthopnea, claudication, leg swelling and PND.  Gastrointestinal:  Positive for heartburn (chronic). Negative for abdominal pain, blood in stool, constipation, diarrhea, melena, nausea and vomiting.  Genitourinary:  Positive for frequency (chronic). Negative for dysuria, flank pain, hematuria and urgency.  Musculoskeletal:  Positive for neck pain (chronic). Negative for back pain, falls, joint pain and myalgias.  Skin:  Negative for itching and rash.  Neurological:  Positive for dizziness and tremors  (chronic). Negative for tingling, sensory change, speech change, focal weakness, seizures, loss of consciousness, weakness and headaches.  Endo/Heme/Allergies:  Negative for environmental allergies and polydipsia. Does not bruise/bleed easily.  Psychiatric/Behavioral:  Positive for depression (chronic). Negative for hallucinations, memory loss, substance abuse and suicidal ideas. The patient is not nervous/anxious and does not have insomnia.           Objective:  Physical Exam: BP (!) 145/80 (BP Location: Right Arm, Patient Position: Sitting)   Pulse 69   Temp 98.1 F (36.7 C) (Temporal)   Resp 14   Ht 5' (1.524 m)   Wt 178 lb 6.4 oz (80.9 kg)   SpO2 95%   BMI 34.84 kg/m   She is a polite, friendly, and genuine person Constitutional: NAD, AAO, not ill-appearing  Neuro: alert, no focal deficit obvious, articulate speech Psych: normal mood, behavior, thought content  Problem-specific physical exam findings:  She was able to stand up walk across the room very quickly without any signs of dizziness and she even did eye closing without being dizzy and she is reports that she did not even  feel dizzy although she still cannot stand on 1 leg   She walks with cane usually but able to do without. Reports balance issues chronic.  No images are attached to the encounter.

## 2022-03-07 NOTE — Patient Instructions (Addendum)
It was a pleasure seeing you today! I truly hope you feel like you received 5 star service and please let me know if there is anything I can improve.  Martha Pacas, MD   Today the plan is...  Postural dizziness Assessment & Plan: She reports that she follows with Dr. Quentin Ore and has had her thyroid checked in the last 4 to 5 months.  She reports that she has been taking Ditropan omeprazole and Celexa and I explained how each 1 of these medications could contribute to the postural dizziness.  Also she has recently increased her gabapentin and that possibly could have contributed as well.  However since the symptoms are completely gone now I recommend no medicine change or further work-up at this time but I will just report what happened to her primary care Dr. [name] and her endocrinologist Dr. Quentin Ore for their consideration at the next visit if perhaps work-up is desired or the problem continues to to return.  Because she did not pass out or have any heart racing or skipping beats I do not think full blood work-up is necessary at this time.  I did encourage her to go to the ER if she starts feeling like she is going to pass out if it comes back.  Also I encouraged her that if it comes back to try drinking salty fluid because that can raise the blood pressure and usually lower blood pressure is what is responsible for dizziness when you are standing up.  Urged her to keep getting PreserVision and discuss with her primary care if there is potential to taper off some of the medications that might contribute in the future.   Primary hypertension Assessment & Plan: I encouraged her to take her blood pressure at home daily and bring it to a follow-up appointment with Dr. [name] next month.  I discouraged her from doing anything to lower her blood pressure right now because of the dizziness that she has been getting I do encourage weight loss.          '[x]'$  RETURN TO CLINIC: Return in about 1 month  (around 04/06/2022) for Follow-up with Dr. [name] about the transit and postural dizziness-hydration working? and bp.   - If you are not doing well: RETURN to the office sooner. - Please bring all your medicines to each appointment.  - If your condition begins to worsen or become severe:  GO to the ER.  '[x]'$  QUESTIONS/CONCERNS:  If you have follow-up questions / concerns:  - CLINICAL: please contact me via phone [phone] OR MyChart messaging  - [place] you will be contacted with the lab results as soon as they are available. For any labs or imaging tests, we will call you if the results are significantly abnormal.  Most normal results will be posted to myChart as soon as they are available and I will comment on them there within 2-3 business days.  The fastest way to get your results is to activate your My Chart account. Instructions are located on the last page of this paperwork. If you have not heard from [place] regarding the results in 2 weeks, please contact this office.  - BILLING: xray and lab orders are billed from separate companies and questions./concerns should be directed to the [facility].  For visit charges please discuss with our administrative services

## 2022-03-07 NOTE — Assessment & Plan Note (Signed)
I encouraged her to take her blood pressure at home daily and bring it to a follow-up appointment with Dr. [name] next month.  I discouraged her from doing anything to lower her blood pressure right now because of the dizziness that she has been getting I do encourage weight loss.

## 2022-03-07 NOTE — Assessment & Plan Note (Signed)
Martha Gilmore reports that Martha Gilmore follows with Dr. Quentin Ore and has had her thyroid checked in the last 4 to 5 months.  Martha Gilmore reports that Martha Gilmore has been taking Ditropan omeprazole and Celexa and I explained how each 1 of these medications could contribute to the postural dizziness.  Also Martha Gilmore has recently increased her gabapentin and that possibly could have contributed as well.  However since the symptoms are completely gone now I recommend no medicine change or further work-up at this time but I will just report what happened to her primary care Dr. [name] and her endocrinologist Dr. Quentin Ore for their consideration at the next visit if perhaps work-up is desired or the problem continues to to return.  Because Martha Gilmore did not pass out or have any heart racing or skipping beats I do not think full blood work-up is necessary at this time.  I did encourage her to go to the ER if Martha Gilmore starts feeling like Martha Gilmore is going to pass out if it comes back.  Also I encouraged her that if it comes back to try drinking salty fluid because that can raise the blood pressure and usually lower blood pressure is what is responsible for dizziness when you are standing up.  Urged her to keep getting PreserVision and discuss with her primary care if there is potential to taper off some of the medications that might contribute in the future.

## 2022-03-20 ENCOUNTER — Other Ambulatory Visit: Payer: Self-pay | Admitting: Family Medicine

## 2022-03-20 DIAGNOSIS — I483 Typical atrial flutter: Secondary | ICD-10-CM

## 2022-03-20 DIAGNOSIS — E782 Mixed hyperlipidemia: Secondary | ICD-10-CM

## 2022-03-20 DIAGNOSIS — E890 Postprocedural hypothyroidism: Secondary | ICD-10-CM

## 2022-03-24 ENCOUNTER — Encounter: Payer: Self-pay | Admitting: Family Medicine

## 2022-04-05 ENCOUNTER — Ambulatory Visit: Payer: Self-pay

## 2022-04-05 ENCOUNTER — Ambulatory Visit: Payer: Medicare Other | Admitting: Family Medicine

## 2022-04-05 ENCOUNTER — Ambulatory Visit (INDEPENDENT_AMBULATORY_CARE_PROVIDER_SITE_OTHER): Payer: Medicare Other

## 2022-04-05 VITALS — BP 148/76 | HR 63 | Ht 60.0 in | Wt 183.0 lb

## 2022-04-05 DIAGNOSIS — G8929 Other chronic pain: Secondary | ICD-10-CM

## 2022-04-05 DIAGNOSIS — M25561 Pain in right knee: Secondary | ICD-10-CM

## 2022-04-05 NOTE — Progress Notes (Signed)
I, Peterson Lombard, LAT, ATC acting as a scribe for [name], MD.  Martha Gilmore is a 81 y.o. female who presents to [facility] today for R knee pain. Pt was last seen by Dr. [name] on 12/15/21 for chronic neck pain. Today, pt c/o R knee pain ongoing since 12/3. Pt had an injury to the R knee 45+ years ago. Pt locates pain to the anterior-medial aspect of her R knee. Pt c/o R knee pain waking her up at night the past view nights  R Knee swelling: no Mechanical symptoms: no Aggravates: weight bearing Treatments tried: ice, rest, prior steroid injections, compression sleeve   Pertinent review of systems: No fevers or chills  Relevant historical information: Neck pain.  Atrial flutter.   Exam:  BP (!) 148/76   Pulse 63   Ht 5' (1.524 m)   Wt 183 lb (83 kg)   SpO2 97%   BMI 35.74 kg/m  General: Well Developed, well nourished, and in no acute distress.   MSK: Right knee bossing medial joint line.  Tender palpation medial joint line.  Normal knee motion.  Stable ligamentous exam intact strength.    Lab and Radiology Results  Procedure: Real-time Ultrasound Guided Injection of right knee superior lateral patellar space Device: Philips Affiniti 50G Images permanently stored and available for review in PACS Verbal informed consent obtained.  Discussed risks and benefits of procedure. Warned about infection, bleeding, hyperglycemia damage to structures among others. Patient expresses understanding and agreement Time-out conducted.   Noted no overlying erythema, induration, or other signs of local infection.   Skin prepped in a sterile fashion.   Local anesthesia: Topical Ethyl chloride.   With sterile technique and under real time ultrasound guidance: 40 mg of Kenalog and 2 mL of Marcaine injected into the knee joint. Fluid seen entering the joint capsule.   Completed without difficulty   Pain immediately resolved suggesting accurate placement of  the medication.   Advised to call if fevers/chills, erythema, induration, drainage, or persistent bleeding.   Images permanently stored and available for review in the ultrasound unit.  Impression: Technically successful ultrasound guided injection.   X-ray images right knee obtained today personally and independently interpreted Moderate to severe medial compartment DJD.  Mild patellofemoral DJD.  No acute fractures are present. Await formal radiology review     Assessment and Plan: 81 y.o. female with exacerbation of right knee pain due to medial compartment DJD.  Plan for steroid injection today.  Recheck back as needed.  Also recommend Voltaren gel.   PDMP not reviewed this encounter. Orders Placed This Encounter  Procedures   [place] LIMITED JOINT SPACE STRUCTURES LOW RIGHT(NO LINKED CHARGES)    Order Specific Question:   Reason for Exam (SYMPTOM  OR DIAGNOSIS REQUIRED)    Answer:   right knee pain    Order Specific Question:   Preferred imaging location?    Answer:   [place]   DG Knee AP/LAT W/Sun[place] Right    Standing Status:   Future    Number of Occurrences:   1    Standing Expiration Date:   05/06/2022    Order Specific Question:   Reason for Exam (SYMPTOM  OR DIAGNOSIS REQUIRED)    Answer:   right knee pain    Order Specific Question:   Preferred imaging location?    Answer:   [facility]   No orders of the defined types were placed in  this encounter.    Discussed warning signs or symptoms. Please see discharge instructions. Patient expresses understanding.   The above documentation has been reviewed and is accurate and complete [name], M.D.

## 2022-04-05 NOTE — Patient Instructions (Signed)
Thank you for coming in today.   Please get an Xray today before you leave   Call or go to the ER if you develop a large red swollen joint with extreme pain or oozing puss.    Recheck as needed.   Please use Voltaren gel (Generic Diclofenac Gel) up to 4x daily for pain as needed.  This is available over-the-counter as both the name brand Voltaren gel and the generic diclofenac gel.

## 2022-04-06 ENCOUNTER — Telehealth: Payer: Self-pay

## 2022-04-06 NOTE — Progress Notes (Signed)
  Chronic Care Management   Note  04/06/2022 Name: Martha Gilmore MRN: [id number] DOB: 1940-08-08  Martha Gilmore is a 81 y.o. year old female who is a primary care patient of Leamon Arnt, MD. I reached out to Martha Gilmore by phone today in response to a referral sent by Martha Gilmore PCP.  Martha Gilmore  agreedto scheduling an appointment with the CCM RN Case Manager   Follow up plan: Patient agreed to scheduled appointment with RN Case Manager on 05/22/2022(date/time).   Martha Gilmore, Martha Gilmore, Martha Gilmore [id number] Direct Dial: [phone] [email or web address]

## 2022-04-07 ENCOUNTER — Ambulatory Visit (INDEPENDENT_AMBULATORY_CARE_PROVIDER_SITE_OTHER): Payer: Medicare Other | Admitting: Family Medicine

## 2022-04-07 ENCOUNTER — Encounter: Payer: Self-pay | Admitting: Family Medicine

## 2022-04-07 VITALS — BP 138/78 | HR 63 | Temp 97.9°F | Ht 60.0 in | Wt 178.6 lb

## 2022-04-07 DIAGNOSIS — N3946 Mixed incontinence: Secondary | ICD-10-CM

## 2022-04-07 DIAGNOSIS — M45A1 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: Secondary | ICD-10-CM

## 2022-04-07 DIAGNOSIS — M542 Cervicalgia: Secondary | ICD-10-CM | POA: Diagnosis not present

## 2022-04-07 DIAGNOSIS — R030 Elevated blood-pressure reading, without diagnosis of hypertension: Secondary | ICD-10-CM

## 2022-04-07 DIAGNOSIS — N3281 Overactive bladder: Secondary | ICD-10-CM

## 2022-04-07 DIAGNOSIS — R42 Dizziness and giddiness: Secondary | ICD-10-CM

## 2022-04-07 DIAGNOSIS — G8929 Other chronic pain: Secondary | ICD-10-CM

## 2022-04-07 DIAGNOSIS — M1711 Unilateral primary osteoarthritis, right knee: Secondary | ICD-10-CM

## 2022-04-07 MED ORDER — GABAPENTIN 300 MG PO CAPS: 600.0000 mg | ORAL_CAPSULE | Freq: Three times a day (TID) | ORAL | 3 refills | Status: DC

## 2022-04-07 NOTE — Patient Instructions (Signed)
Please follow up as scheduled for your next visit with me: 05/03/2022   If you have any questions or concerns, please don't hesitate to send me a message via MyChart or call the office at [phone]. Thank you for visiting with [place] today! It's our pleasure caring for you.

## 2022-04-07 NOTE — Progress Notes (Signed)
Subjective  CC:  Chief Complaint  Patient presents with   Follow-up    Concerned about new pain in her right knee saw dr. [name] last week and got knee injection.  Need refill for Gabapentin    HPI: Martha Gilmore is a 81 y.o. female who presents to the office today to address the problems listed above in the chief complaint. Follow-up dizziness: I reviewed acute care visit note from last month.  Had about 24 to 48 hours of feeling very fatigued and slightly off.  Workup was negative and thought to be related to overdoing it and maybe being dehydrated.  Could also be related to medication side effects.  Fortunately no symptoms return.  She remains well.  No further episodes of dizziness, lightheadedness or fatigue. Chronic neck pain due to arthritis and neuropathy: Reviewed cervical MRI and sports medicine notes.  Maintains on tramadol and gabapentin.  We recently increased the dose from 300 twice daily to 300 every morning and 600 every afternoon.  She is tolerating this well and thinks it may be helpful and is also helping her neck pain.  Still with some intermittent random paresthesias.  She took an extra dose in the afternoon which seemed to calm things down yesterday.  Denies adverse effects. Arthritis right knee: Status post steroid injection 2 days ago.  I reviewed sports medicine notes.  Ultrasound-guided.  She feels it may be helping a little but not quite relieving pain significantly yet.  She inquires about hyaluronic acid injections. Overactive bladder: On Ditropan and may be getting some relief but most of her symptomatology is coming from stress incontinence.  Wears a pad. Has had some elevated blood pressure readings in the office but is often with pain.  Home readings: Elevated by using a wrist cuff.  Uncertain of validity  Assessment  1. Disequilibrium   2. 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 Chronic neck pain   4. Urge and  stress incontinence   5. OAB (overactive bladder)   6. Osteoarthrosis, localized, primary, knee, right   7. Elevated blood pressure reading without diagnosis of hypertension      Plan  Disequilibrium: Resolved Chronic neck pain due to arthritis with occipital neuropathy: Stabilizing.  Reports it is now tolerable.  Will continue to titrate up gabapentin to see if he can get more relief.  At this time, continue 300 every morning and 600 every afternoon.  She will see if she can tolerate a 300 mg dose in the afternoon.  Then can slowly titrate up to a max of 600 3 times daily if needed.  Patient or stands and agrees with care plan.  Refilled medication.  Continue tramadol Continue Ditropan for urge symptoms but educated on need for urologic eval if patient is interested because stress symptoms typically require surgical intervention.  Patient defers for now. Right knee arthritis: Status post steroid injection.  She will wait to see if she gets better results.  She can talk with sports medicine about Synvisc or other synovial fluid supplement.  Unlikely to be helpful given her moderate to severe disease but it is possible she may get some relief.  Education given Recommend a arm blood pressure cuff and bring in to the office for next check.  Her reading in the office today is normal.  I spent a total of 45 minutes for this patient encounter. Time spent included preparation, face-to-face counseling with the patient and coordination of care, review of chart and  records, and documentation of the encounter.  Follow up: For CPE 05/03/2022  No orders of the defined types were placed in this encounter.  Meds ordered this encounter  Medications   gabapentin (NEURONTIN) 300 MG capsule    Sig: Take 2 capsules (600 mg total) by mouth 3 (three) times daily.    Dispense:  540 capsule    Refill:  3      I reviewed the patients updated PMH, FH, and SocHx.    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 [facility]) 05/02/2021    Priority: High   Obstructive sleep apnea 03/28/2021    Priority: High   Atrial flutter ([place]) 12/30/2020    Priority: High   Postablative hypothyroidism 12/20/2015    Priority: High   Chondromalacia, knee, right 11/05/2017    Priority: Medium    Osteoarthrosis, localized, primary, knee, right 11/05/2017    Priority: Medium    Major depression, recurrent, chronic ([place]) 04/16/2017    Priority: Medium    Mixed hyperlipidemia 04/16/2017    Priority: Medium    History of Graves' disease 03/22/2015    Priority: Medium    GERD (gastroesophageal reflux disease) 12/13/2012    Priority: Medium    Osteopenia 08/05/2012    Priority: Medium    DDD (degenerative disc disease), lumbosacral 10/22/2010    Priority: Medium    Nonrheumatic aortic valve insufficiency 04/07/2021    Priority: Low   Aortic insufficiency 01/18/2021    Priority: Low   Bilateral sensorineural hearing loss 02/06/2016    Priority: Low   OAB (overactive bladder) 12/16/2013    Priority: Low   Osteoarthritis of CMC joint of thumb 08/13/2012    Priority: Low   Seasonal allergies 08/13/2012    Priority: Low   Urge and stress incontinence 08/13/2012    Priority: Low   Clostridium difficile infection 04/16/2017   Family history of early CAD 04/16/2017   Current Meds  Medication Sig   atorvastatin (LIPITOR) 10 MG tablet TAKE 1 TABLET BY MOUTH EVERYDAY AT BEDTIME   citalopram (CELEXA) 20 MG tablet TAKE 1 TABLET BY MOUTH EVERY DAY   levothyroxine (SYNTHROID) 100 MCG tablet Mon- Sat 100 mcg daily, and Sun 150 mcg daily   Multiple Vitamins-Minerals (PRESERVISION AREDS PO) Take 1 capsule by mouth in the morning and at bedtime.   omeprazole (PRILOSEC) 20  MG capsule TAKE 1 CAPSULE BY MOUTH EVERY DAY   oxybutynin (DITROPAN-XL) 10 MG 24 hr tablet TAKE 1 TABLET BY MOUTH AT  BEDTIME   Polyethyl Glycol-Propyl Glycol (SYSTANE OP) Place 1 drop into both eyes daily as needed (DRY EYES).   traMADol (ULTRAM) 50 MG tablet TAKE 1 TABLET BY MOUTH THREE TIMES A DAY AS NEEDED   [DISCONTINUED] gabapentin (NEURONTIN) 300 MG capsule Take 1 capsule (300 mg total) by mouth 2 (two) times daily. (Patient taking differently: Take 300 mg by mouth 2 (two) times daily. '300mg'$  in the morning and '600mg'$  at bedtime)    Allergies: Patient is allergic to aspirin, demerol  [meperidine hcl], penicillins, statins, cephalexin, clindamycin/lincomycin, and tape. Family History: Patient family history includes Brain cancer in her brother; Cancer in her father; Diabetes in her sister; Heart disease in her father, mother, and sister; Hypertension in her mother;  Kidney cancer in her sister; Lung cancer in her sister. Social History:  Patient  reports that she has never smoked. She has never used smokeless tobacco. She reports that she does not drink alcohol and does not use drugs.  Review of Systems: Constitutional: Negative for fever malaise or anorexia Cardiovascular: negative for chest pain Respiratory: negative for SOB or persistent cough Gastrointestinal: negative for abdominal pain  Objective  Vitals: BP 138/78   Pulse 63   Temp 97.9 F (36.6 C)   Ht 5' (1.524 m)   Wt 178 lb 9.6 oz (81 kg)   SpO2 96%   BMI 34.88 kg/m  General: no acute distress , A&Ox3 HEENT: [place]ER[place], conjunctiva normal, neck is supple   Commons side effects, risks, benefits, and alternatives for medications and treatment plan prescribed today were discussed, and the patient expressed understanding of the given instructions. Patient is instructed to call or message via MyChart if he/she has any questions or concerns regarding our treatment plan. No barriers to understanding were identified. We discussed  Red Flag symptoms and signs in detail. Patient expressed understanding regarding what to do in case of urgent or emergency type symptoms.  Medication list was reconciled, printed and provided to the patient in AVS. Patient instructions and summary information was reviewed with the patient as documented in the AVS. This note was prepared with assistance of Dragon voice recognition software. Occasional wrong-word or sound-a-like substitutions may have occurred due to the inherent limitations of voice recognition software  This visit occurred during the SARS-CoV-2 public health emergency.  Safety protocols were in place, including screening questions prior to the visit, additional usage of staff PPE, and extensive cleaning of exam room while observing appropriate contact time as indicated for disinfecting solutions.

## 2022-04-07 NOTE — Progress Notes (Signed)
Right knee x-ray shows arthritis changes.

## 2022-04-10 ENCOUNTER — Encounter: Payer: Self-pay | Admitting: Cardiology

## 2022-04-10 ENCOUNTER — Ambulatory Visit: Payer: Medicare Other | Attending: Cardiology | Admitting: Cardiology

## 2022-04-10 VITALS — BP 122/80 | HR 76 | Ht 60.0 in | Wt 177.4 lb

## 2022-04-10 DIAGNOSIS — E782 Mixed hyperlipidemia: Secondary | ICD-10-CM | POA: Diagnosis not present

## 2022-04-10 DIAGNOSIS — G4733 Obstructive sleep apnea (adult) (pediatric): Secondary | ICD-10-CM | POA: Diagnosis not present

## 2022-04-10 DIAGNOSIS — I483 Typical atrial flutter: Secondary | ICD-10-CM

## 2022-04-10 NOTE — Patient Instructions (Signed)
Medication Instructions:  Your physician recommends that you continue on your current medications as directed. Please refer to the Current Medication list given to you today.  *If you need a refill on your cardiac medications before your next appointment, please call your pharmacy*   Lab Work: NONE If you have labs (blood work) drawn today and your tests are completely normal, you will receive your results only by: [place] (if you have MyChart) OR A paper copy in the mail If you have any lab test that is abnormal or we need to change your treatment, we will call you to review the results.   Testing/Procedures: NONE   Follow-Up: At [facility], you and your health needs are our priority.  As part of our continuing mission to provide you with exceptional heart care, we have created designated Provider Care Teams.  These Care Teams include your primary Cardiologist (physician) and Advanced Practice Providers (APPs -  Physician Assistants and Nurse Practitioners) who all work together to provide you with the care you need, when you need it.  We recommend signing up for the patient portal called "MyChart".  Sign up information is provided on this After Visit Summary.  MyChart is used to connect with patients for Virtual Visits (Telemedicine).  Patients are able to view lab/test results, encounter notes, upcoming appointments, etc.  Non-urgent messages can be sent to your provider as well.   To learn more about what you can do with MyChart, go to [email or web address].    Your next appointment:   9 month(s)  The format for your next appointment:   In Person  Provider:   Berniece Salines, DO

## 2022-04-10 NOTE — Progress Notes (Signed)
Cardiology Office Note:    Date:  04/10/2022   ID:  Martha Gilmore, DOB 1940/09/19, MRN [id number]  PCP:  Leamon Arnt, MD  Cardiologist:  Berniece Salines, DO  Electrophysiologist:  Constance Haw, MD   Referring MD: Leamon Arnt, MD   No chief complaint on file.   History of Present Illness:    Martha Gilmore is a 81 y.o. female with a hx of atrial flutter status post ablation on Eliquis, aortic insufficiency, hypertension, hyperlipidemia, OSA here today for follow-up visit.   At her visit on October 05, 2021 she was looking forward to her procedure.  She has had her ablation and is doing well.  Since then she has also seen EP.  Her only issue today is the fact that she is not having success with the CPAP.  She is trying to get in touch with the sleep team but has been unsuccessful.  No chest pain.  No worsening shortness of breath.  Past Medical History:  Diagnosis Date   Aortic insufficiency    mild to moderate by echo 12/2020   Atrial flutter ([place]) 12/30/2020   Cataracts, bilateral 09/23/2015   Clostridium difficile infection 04/16/2017   H/o C.diff due to clindamicyin 09/2016; two rounds of treatment for cure. Dr. Benson Norway   Depression    Hyperlipidemia    Hypertension    OAB (overactive bladder) 12/16/2013   Osteopenia 08/05/2012   Dexa scan 07/2012 mild osteopenia, T = -1.1   Seasonal allergies    Urge and stress incontinence     Past Surgical History:  Procedure Laterality Date   A-FLUTTER ABLATION N/A 10/14/2021   Procedure: A-FLUTTER ABLATION;  Surgeon: Constance Haw, MD;  Location: [facility];  Service: Cardiovascular;  Laterality: N/A;   APPENDECTOMY  1971   CARDIOVERSION N/A 04/13/2021   Procedure: CARDIOVERSION;  Surgeon: Werner Lean, MD;  Location: [facility];  Service: Cardiovascular;  Laterality: N/A;   CESAREAN SECTION     CHOLECYSTECTOMY     Teeth Implants     TRIGGER FINGER RELEASE     VAGINAL HYSTERECTOMY       Current Medications: Current Meds  Medication Sig   atorvastatin (LIPITOR) 10 MG tablet TAKE 1 TABLET BY MOUTH EVERYDAY AT BEDTIME   citalopram (CELEXA) 20 MG tablet TAKE 1 TABLET BY MOUTH EVERY DAY   gabapentin (NEURONTIN) 300 MG capsule Take 2 capsules (600 mg total) by mouth 3 (three) times daily.   levothyroxine (SYNTHROID) 100 MCG tablet Mon- Sat 100 mcg daily, and Sun 150 mcg daily   Multiple Vitamins-Minerals (PRESERVISION AREDS PO) Take 1 capsule by mouth in the morning and at bedtime.   omeprazole (PRILOSEC) 20 MG capsule TAKE 1 CAPSULE BY MOUTH EVERY DAY   oxybutynin (DITROPAN-XL) 10 MG 24 hr tablet TAKE 1 TABLET BY MOUTH AT  BEDTIME   Polyethyl Glycol-Propyl Glycol (SYSTANE OP) Place 1 drop into both eyes daily as needed (DRY EYES).   traMADol (ULTRAM) 50 MG tablet TAKE 1 TABLET BY MOUTH THREE TIMES A DAY AS NEEDED     Allergies:   Aspirin, Demerol  [meperidine hcl], Penicillins, Statins, Cephalexin, Clindamycin/lincomycin, and Tape   Social History   Socioeconomic History   Marital status: Widowed    Spouse name: Not on file   Number of children: Not on file   Years of education: Not on file   Highest education level: Not on file  Occupational History   Not on file  Tobacco Use  Smoking status: Never   Smokeless tobacco: Never  Vaping Use   Vaping Use: Never used  Substance and Sexual Activity   Alcohol use: No   Drug use: No   Sexual activity: Never    Partners: Male  Other Topics Concern   Not on file  Social History Narrative   Not on file   Social Determinants of Health   Financial Resource Strain: Low Risk  (06/09/2021)   Overall Financial Resource Strain (CARDIA)    Difficulty of Paying Living Expenses: Not hard at all  Food Insecurity: No Food Insecurity (06/09/2021)   Hunger Vital Sign    Worried About Running Out of Food in the Last Year: Never true    Ran Out of Food in the Last Year: Never true  Transportation Needs: No Transportation  Needs (06/09/2021)   PRAPARE - Hydrologist (Medical): No    Lack of Transportation (Non-Medical): No  Physical Activity: Inactive (06/09/2021)   Exercise Vital Sign    Days of Exercise per Week: 0 days    Minutes of Exercise per Session: 0 min  Stress: No Stress Concern Present (06/09/2021)   [place]    Feeling of Stress : Not at all  Social Connections: Moderately Integrated (06/09/2021)   Social Connection and Isolation Panel [NHANES]    Frequency of Communication with Friends and Family: Once a week    Frequency of Social Gatherings with Friends and Family: More than three times a week    Attends Religious Services: More than 4 times per year    Active Member of [employer] or Organizations: Yes    Attends Archivist Meetings: 1 to 4 times per year    Marital Status: Widowed     Family History: The patient's family history includes Brain cancer in her brother; Cancer in her father; Diabetes in her sister; Heart disease in her father, mother, and sister; Hypertension in her mother; Kidney cancer in her sister; Lung cancer in her sister.  ROS:   Review of Systems  Constitution: Negative for decreased appetite, fever and weight gain.  HENT: Negative for congestion, ear discharge, hoarse voice and sore throat.   Eyes: Negative for discharge, redness, vision loss in right eye and visual halos.  Cardiovascular: Negative for chest pain, dyspnea on exertion, leg swelling, orthopnea and palpitations.  Respiratory: Negative for cough, hemoptysis, shortness of breath and snoring.   Endocrine: Negative for heat intolerance and polyphagia.  Hematologic/Lymphatic: Negative for bleeding problem. Does not bruise/bleed easily.  Skin: Negative for flushing, nail changes, rash and suspicious lesions.  Musculoskeletal: Negative for arthritis, joint pain, muscle cramps, myalgias, neck pain and stiffness.   Gastrointestinal: Negative for abdominal pain, bowel incontinence, diarrhea and excessive appetite.  Genitourinary: Negative for decreased libido, genital sores and incomplete emptying.  Neurological: Negative for brief paralysis, focal weakness, headaches and loss of balance.  Psychiatric/Behavioral: Negative for altered mental status, depression and suicidal ideas.  Allergic/Immunologic: Negative for HIV exposure and persistent infections.    EKGs/Labs/Other Studies Reviewed:    The following studies were reviewed today:   EKG:  None today   TTE 01/18/2021 Zio monitor  Predominant rhythm was atrial flutter with average heart rate 92bpm and ranged from 48 to 172bpm. No sinus rhythm was present. Occasional PVCs were present.   TTE 01/18/2021 IMPRESSIONS   1. The aortic valve is calcified. Aortic valve regurgitation is mild to moderate. Mild to moderate aortic  valve sclerosis/calcification is present, without any evidence of aortic stenosis and stroke volume index  of 38.   2. Left ventricular ejection fraction, by estimation, is 60 to 65%. The  left ventricle has normal function. The left ventricle has no regional  wall motion abnormalities. Left ventricular diastolic parameters are  indeterminate due to atrial flutter.   3. Right ventricular systolic function is normal. The right ventricular  size is normal. There is normal pulmonary artery systolic pressure. The  estimated right ventricular systolic pressure is 41.7 mmHg.   4. Left atrial size was mildly dilated.   5. The mitral valve is abnormal. Mild mitral valve regurgitation. The  mean mitral valve gradient is 2.0 mmHg with average heart rate of 103 bpm.   6. The inferior vena cava is normal in size with greater than 50%  respiratory variability, suggesting right atrial pressure of 3 mmHg.   Comparison(s): No prior Echocardiogram.   FINDINGS   Left Ventricle: Left ventricular ejection fraction, by estimation, is 60  to  65%. The left ventricle has normal function. The left ventricle has no  regional wall motion abnormalities. The left ventricular internal cavity  size was normal in size. There is   no left ventricular hypertrophy. Left ventricular diastolic parameters  are indeterminate.   Right Ventricle: The right ventricular size is normal. No increase in  right ventricular wall thickness. Right ventricular systolic function is  normal. There is normal pulmonary artery systolic pressure. The tricuspid  regurgitant velocity is 1.94 m/s, and   with an assumed right atrial pressure of 3 mmHg, the estimated right  ventricular systolic pressure is 40.8 mmHg.   Left Atrium: Left atrial size was mildly dilated.   Right Atrium: Right atrial size was normal in size.   Pericardium: There is no evidence of pericardial effusion.   Mitral Valve: The mitral valve is abnormal. Mild mitral valve  regurgitation. The mean mitral valve gradient is 2.0 mmHg with average  heart rate of 103 bpm.   Tricuspid Valve: The tricuspid valve is grossly normal. Tricuspid valve  regurgitation is mild . No evidence of tricuspid stenosis.   Aortic Valve: The aortic valve is calcified. Aortic valve regurgitation is  mild to moderate. Mild to moderate aortic valve sclerosis/calcification is  present, without any evidence of aortic stenosis. Aortic valve mean  gradient measures 11.0 mmHg. Aortic  valve peak gradient measures 19.4 mmHg. Aortic valve area, by VTI measures  1.88 cm.   Pulmonic Valve: The pulmonic valve was grossly normal. Pulmonic valve  regurgitation is mild.   Aorta: The aortic root and ascending aorta are structurally normal, with  no evidence of dilitation.   Pulmonary Artery: The pulmonary artery is of normal size.   Venous: The inferior vena cava is normal in size with greater than 50%  respiratory variability, suggesting right atrial pressure of 3 mmHg.   IAS/Shunts: The atrial septum is grossly  normal.   Recent Labs: 05/02/2021: ALT 13; TSH 6.68 10/07/2021: BUN 23; Creatinine, Ser 0.91; Hemoglobin 12.5; Platelets 243; Potassium 4.8; Sodium 140  Recent Lipid Panel    Component Value Date/Time   CHOL 200 05/02/2021 0901   TRIG 57.0 05/02/2021 0901   HDL 67.20 05/02/2021 0901   CHOLHDL 3 05/02/2021 0901   VLDL 11.4 05/02/2021 0901   LDLCALC 122 (H) 05/02/2021 0901    Physical Exam:    VS:  BP 122/80   Pulse 76   Ht 5' (1.524 m)   Wt 177 lb 6.4  oz (80.5 kg)   SpO2 98%   BMI 34.65 kg/m     Wt Readings from Last 3 Encounters:  04/10/22 177 lb 6.4 oz (80.5 kg)  04/07/22 178 lb 9.6 oz (81 kg)  04/05/22 183 lb (83 kg)     GEN: Well nourished, well developed in no acute distress HEENT: Normal NECK: No JVD; No carotid bruits LYMPHATICS: No lymphadenopathy CARDIAC: S1S2 noted,RRR, no murmurs, rubs, gallops RESPIRATORY:  Clear to auscultation without rales, wheezing or rhonchi  ABDOMEN: Soft, non-tender, non-distended, +bowel sounds, no guarding. EXTREMITIES: No edema, No cyanosis, no clubbing MUSCULOSKELETAL:  No deformity  SKIN: Warm and dry NEUROLOGIC:  Alert and oriented x 3, non-focal PSYCHIATRIC:  Normal affect, good insight  ASSESSMENT:    1. Typical atrial flutter ([place])   2. Obstructive sleep apnea   3. Mixed hyperlipidemia     PLAN:     Atrial flutter -she is status post ablation and has remained in sinus rhythm.    She is overall happy.  She has been off Eliquis and Cardizem.  Recurrent symptoms of flutter. OSA-unfortunately she has completely stopped taking the CPAP.  Fatigue she tells me has not reoccurred.  Discussed with the patient she will need to reestablish with our sleep team-will send a message to the team as well. Blood pressure is acceptable, continue with current antihypertensive regimen. Hyperlipidemia - continue with current statin medication. The patient understands the need to lose weight with diet and exercise. We have discussed  specific strategies for this.  The patient is in agreement with the above plan. The patient left the office in stable condition.  The patient will follow up in 6 months or sooner if needed.   Medication Adjustments/Labs and Tests Ordered: Current medicines are reviewed at length with the patient today.  Concerns regarding medicines are outlined above.  No orders of the defined types were placed in this encounter.  No orders of the defined types were placed in this encounter.   Patient Instructions  Medication Instructions:  Your physician recommends that you continue on your current medications as directed. Please refer to the Current Medication list given to you today.  *If you need a refill on your cardiac medications before your next appointment, please call your pharmacy*   Lab Work: NONE If you have labs (blood work) drawn today and your tests are completely normal, you will receive your results only by: [place] (if you have MyChart) OR A paper copy in the mail If you have any lab test that is abnormal or we need to change your treatment, we will call you to review the results.   Testing/Procedures: NONE   Follow-Up: At [facility], you and your health needs are our priority.  As part of our continuing mission to provide you with exceptional heart care, we have created designated Provider Care Teams.  These Care Teams include your primary Cardiologist (physician) and Advanced Practice Providers (APPs -  Physician Assistants and Nurse Practitioners) who all work together to provide you with the care you need, when you need it.  We recommend signing up for the patient portal called "MyChart".  Sign up information is provided on this After Visit Summary.  MyChart is used to connect with patients for Virtual Visits (Telemedicine).  Patients are able to view lab/test results, encounter notes, upcoming appointments, etc.  Non-urgent messages can be sent to your  provider as well.   To learn more about what you can do with MyChart,  go to [email or web address].    Your next appointment:   9 month(s)  The format for your next appointment:   In Person  Provider:   Berniece Salines, DO     Adopting a Healthy Lifestyle.  Know what a healthy weight is for you (roughly BMI <25) and aim to maintain this   Aim for 7+ servings of fruits and vegetables daily   65-80+ fluid ounces of water or unsweet tea for healthy kidneys   Limit to max 1 drink of alcohol per day; avoid smoking/tobacco   Limit animal fats in diet for cholesterol and heart health - choose grass fed whenever available   Avoid highly processed foods, and foods high in saturated/trans fats   Aim for low stress - take time to unwind and care for your mental health   Aim for 150 min of moderate intensity exercise weekly for heart health, and weights twice weekly for bone health   Aim for 7-9 hours of sleep daily   When it comes to diets, agreement about the perfect plan isnt easy to find, even among the experts. Experts at the [employer] developed an idea known as the Healthy Eating Plate. Just imagine a plate divided into logical, healthy portions.   The emphasis is on diet quality:   Load up on vegetables and fruits - one-half of your plate: Aim for color and variety, and remember that potatoes dont count.   Go for whole grains - one-quarter of your plate: Whole wheat, barley, wheat berries, quinoa, oats, brown rice, and foods made with them. If you want pasta, go with whole wheat pasta.   Protein power - one-quarter of your plate: Fish, chicken, beans, and nuts are all healthy, versatile protein sources. Limit red meat.   The diet, however, does go beyond the plate, offering a few other suggestions.   Use healthy plant oils, such as olive, canola, soy, corn, sunflower and peanut. Check the labels, and avoid partially hydrogenated oil, which have unhealthy  trans fats.   If youre thirsty, drink water. Coffee and tea are good in moderation, but skip sugary drinks and limit milk and dairy products to one or two daily servings.   The type of carbohydrate in the diet is more important than the amount. Some sources of carbohydrates, such as vegetables, fruits, whole grains, and beans-are healthier than others.   Finally, stay active  Signed, Berniece Salines, DO  04/10/2022 1:37 PM    [facility]

## 2022-04-18 ENCOUNTER — Encounter: Payer: Self-pay | Admitting: Family Medicine

## 2022-04-28 ENCOUNTER — Telehealth: Payer: Self-pay | Admitting: Family Medicine

## 2022-04-28 ENCOUNTER — Other Ambulatory Visit: Payer: Self-pay | Admitting: Family Medicine

## 2022-04-28 DIAGNOSIS — Z1231 Encounter for screening mammogram for malignant neoplasm of breast: Secondary | ICD-10-CM

## 2022-04-28 NOTE — Telephone Encounter (Signed)
[facility] would like Dr [name] to add a diagnostic mammogram order as she has been experiencing left breast pain.   [facility] phone [phone].

## 2022-04-28 NOTE — Telephone Encounter (Signed)
Message sent thur MyChart to let pt know that she will need an OV for a referral for mammo.

## 2022-05-03 ENCOUNTER — Encounter: Payer: Self-pay | Admitting: Family Medicine

## 2022-05-03 ENCOUNTER — Ambulatory Visit (INDEPENDENT_AMBULATORY_CARE_PROVIDER_SITE_OTHER): Payer: Medicare Other | Admitting: Family Medicine

## 2022-05-03 VITALS — BP 136/52 | HR 59 | Temp 98.8°F | Ht 60.0 in | Wt 178.6 lb

## 2022-05-03 DIAGNOSIS — G4733 Obstructive sleep apnea (adult) (pediatric): Secondary | ICD-10-CM

## 2022-05-03 DIAGNOSIS — M542 Cervicalgia: Secondary | ICD-10-CM

## 2022-05-03 DIAGNOSIS — E782 Mixed hyperlipidemia: Secondary | ICD-10-CM

## 2022-05-03 DIAGNOSIS — I483 Typical atrial flutter: Secondary | ICD-10-CM

## 2022-05-03 DIAGNOSIS — F339 Major depressive disorder, recurrent, unspecified: Secondary | ICD-10-CM

## 2022-05-03 DIAGNOSIS — M45A1 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: Secondary | ICD-10-CM | POA: Diagnosis not present

## 2022-05-03 DIAGNOSIS — G8929 Other chronic pain: Secondary | ICD-10-CM

## 2022-05-03 DIAGNOSIS — Z0000 Encounter for general adult medical examination without abnormal findings: Secondary | ICD-10-CM | POA: Diagnosis not present

## 2022-05-03 DIAGNOSIS — E890 Postprocedural hypothyroidism: Secondary | ICD-10-CM | POA: Diagnosis not present

## 2022-05-03 DIAGNOSIS — M1711 Unilateral primary osteoarthritis, right knee: Secondary | ICD-10-CM

## 2022-05-03 LAB — TSH: TSH: 0.94 u[IU]/mL (ref 0.35–5.50)

## 2022-05-03 LAB — CBC WITH DIFFERENTIAL/PLATELET
Basophils Absolute: 0.1 10*3/uL (ref 0.0–0.1)
Basophils Relative: 0.9 % (ref 0.0–3.0)
Eosinophils Absolute: 0.1 10*3/uL (ref 0.0–0.7)
Eosinophils Relative: 2 % (ref 0.0–5.0)
HCT: 42.2 % (ref 36.0–46.0)
Hemoglobin: 13.7 g/dL (ref 12.0–15.0)
Lymphocytes Relative: 21.8 % (ref 12.0–46.0)
Lymphs Abs: 1.3 10*3/uL (ref 0.7–4.0)
MCHC: 32.4 g/dL (ref 30.0–36.0)
MCV: 89.9 fl (ref 78.0–100.0)
Monocytes Absolute: 0.5 10*3/uL (ref 0.1–1.0)
Monocytes Relative: 8.3 % (ref 3.0–12.0)
Neutro Abs: 4.1 10*3/uL (ref 1.4–7.7)
Neutrophils Relative %: 67 % (ref 43.0–77.0)
Platelets: 255 10*3/uL (ref 150.0–400.0)
RBC: 4.7 Mil/uL (ref 3.87–5.11)
RDW: 15.3 % (ref 11.5–15.5)
WBC: 6.2 10*3/uL (ref 4.0–10.5)

## 2022-05-03 LAB — LIPID PANEL
Cholesterol: 196 mg/dL (ref 0–200)
HDL: 76.9 mg/dL (ref >39.00)
LDL Cholesterol: 107 mg/dL — ABNORMAL HIGH (ref 0–99)
NonHDL: 118.95
Total CHOL/HDL Ratio: 3
Triglycerides: 62 mg/dL (ref 0.0–149.0)
VLDL: 12.4 mg/dL (ref 0.0–40.0)

## 2022-05-03 NOTE — Progress Notes (Signed)
Subjective  Chief Complaint  Patient presents with   Annual Exam    Pt here for Annual exam and is currently fasting     HPI: Martha Gilmore is a 82 y.o. female who presents to [facility] today for a Female Wellness Visit. She also has the concerns and/or needs as listed above in the chief complaint. These will be addressed in addition to the Health Maintenance Visit.   Wellness Visit: annual visit with health maintenance review and exam without Pap  Health maintenance: Patient to schedule mammogram. Last colonoscopy 2016. No 82 yo so no further recommended. Imms current Reviewed home AWV: nl mini cog. Neg ABI's.  Chronic disease f/u and/or acute problem visit: (deemed necessary to be done in addition to the wellness visit): H/o afib s/p ablation; stable. No palpitations cp or sob HLD on statin and tolerating well. Due for recheck.  Hypothyroidism on split dosing levotx; feels clinically well.  OAB stable on meds. Has stress incont as well Neck pain: djd and neuropathic: on gabapentin 600 tid now and c/o memory sxs and feeling foggy and some unsteadiness with walking. It has helped decrease the parestesias.  Depression remains well controlled on celexa 20 daily Knee pain persists. S/p steroid injection w/o benefit. Reivewed xrays: mod to severe tricompartmental djd  Assessment  1. Annual physical exam   2. Postablative hypothyroidism   3. Typical atrial flutter ([place])   4. Obstructive sleep apnea   5. 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 Chronic neck pain   7. Major depression, recurrent, chronic ([place])   8. Mixed hyperlipidemia      Plan  Female Wellness Visit: Age appropriate Health Maintenance and Prevention measures were discussed with patient. Included topics are cancer screening recommendations, ways to keep healthy (see AVS) including dietary and exercise recommendations, regular eye and dental  care, use of seat belts, and avoidance of moderate alcohol use and tobacco use. To schedule mammogram, normal breast exam today BMI: discussed patient's BMI and encouraged positive lifestyle modifications to help get to or maintain a target BMI. HM needs and immunizations were addressed and ordered. See below for orders. See HM and immunization section for updates. current Routine labs and screening tests ordered including cmp, cbc and lipids where appropriate. Discussed recommendations regarding Vit D and calcium supplementation (see AVS)  Chronic disease management visit and/or acute problem visit: Recheck Tsh; clinically euthyroid Atrial flutter is corrected after ablation;  Neck pain with side effects from meds: decrease dose of gabapentin to 300 bid and 600 qhs. To let me know if side effects don't improve.  Depression is stable on celexa '20mg'$  daily.  HLD on atorvastatin 20 nightly; recheck lipids and liver tests. Tolerating well OAB on oxybutinin xl 10 daily;improved Hypthyroidism: onlevotx 100 m-sat and 150 on Sunday.  Mod severe knee OA; education given. Supportive care  Follow up: 6 mo for recheck  Orders Placed This Encounter  Procedures   TSH   CBC with Differential/Platelet   COMPLETE METABOLIC PANEL WITH GFR   Lipid panel   No orders of the defined types were placed in this encounter.     Body mass index is 34.88 kg/m. 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 Work up ongoing. Dr. Verl Blalock S/p ablation 2023    Postablative hypothyroidism 12/20/2015    Priority: High   Chondromalacia, knee, right 11/05/2017    Priority: Medium    Osteoarthrosis, localized, primary, knee, right 11/05/2017    Priority: Medium    Major depression, recurrent, chronic ([place]) 04/16/2017    Priority: Medium    Mixed hyperlipidemia 04/16/2017    Priority: Medium    History of Graves' disease 03/22/2015    Priority: Medium     Overview:  Treated with RAI-131 01/2015 Dr Steffanie Dunn    GERD (gastroesophageal reflux disease) 12/13/2012    Priority: Medium     EGD 09/2016 - Dr. Benson Norway, normal    Osteopenia 08/05/2012    Priority: Medium     DEXA 07/2020: lowest T = - 2.1, osteopenia. Recheck 3 years.  DEXA 04/2018: stable osteopenia. Recheck 2-3 years.  DEXA 01/2015: T = -1.5, osteopenia; rec calcium and vit D and exercise. Recheck 2-3 years. Bone density scan April 2014-mild osteopenia, T equals -1.1    DDD (degenerative disc disease), lumbosacral 10/22/2010    Priority: Medium    Nonrheumatic aortic valve insufficiency 04/07/2021    Priority: Low   Aortic insufficiency 01/18/2021    Priority: Low    mild to moderate by echo 12/2020    Bilateral sensorineural hearing loss 02/06/2016    Priority: Low    Overview:  [facility] review 2017    OAB (overactive bladder) 12/16/2013    Priority: Low   Osteoarthritis of CMC joint of thumb 08/13/2012    Priority: Low   Seasonal allergies 08/13/2012    Priority: Low   Urge and stress incontinence 08/13/2012    Priority: Low   Clostridium difficile infection 04/16/2017    H/o C.diff due to clindamicyin 09/2016; two rounds of treatment for cure. Dr. Benson Norway    Family history of early CAD 04/16/2017    Treadmill test 04/2017 - equivocal: didn't reach maximal HR; otherwise negative.     Health  Maintenance  Topic Date Due   Medicare Annual Wellness (AWV)  06/09/2022   COVID-19 Vaccine (6 - 2023-24 season) 05/19/2022 (Originally 12/23/2021)   MAMMOGRAM  05/05/2022   DEXA SCAN  08/18/2023   Pneumonia Vaccine 39+ Years old  Completed   INFLUENZA VACCINE  Completed   Zoster Vaccines- Shingrix  Completed   HPV VACCINES  Aged Out   DTaP/Tdap/Td  Discontinued   Immunization History  Administered Date(s) Administered   Fluad Quad(high Dose 65+) 01/13/2021, 12/28/2021   Influenza Split 01/15/2012   Influenza, High Dose Seasonal PF 01/30/2013, 01/05/2015, 02/17/2016, 02/02/2017, 01/18/2018, 12/11/2018, 12/23/2019, 01/13/2021   Influenza, Seasonal, Injecte, Preservative Fre 01/21/2014   Influenza-Unspecified 01/09/2011, 01/15/2012, 01/21/2014, 01/22/2018   PFIZER(Purple Top)SARS-COV-2 Vaccination 05/15/2019, 06/05/2019, 01/20/2020, 08/16/2020   Pfizer Covid-19 Vaccine Bivalent Booster 3yr & up 01/14/2021   Pneumococcal Conjugate-13 01/05/2015   Pneumococcal Polysaccharide-23 04/24/2006, 11/18/2007, 05/30/2012   Respiratory Syncytial Virus Vaccine,Recomb Aduvanted(Arexvy) 03/28/2022   Tdap 08/17/2010   Zoster Recombinat (Shingrix) 06/29/2018,  11/01/2018   Zoster, Live 04/25/1995   We updated and reviewed the patient's past history in detail and it is documented below. Allergies: Patient is allergic to aspirin, demerol  [meperidine hcl], penicillins, statins, cephalexin, clindamycin/lincomycin, and tape. Past Medical History Patient  has a past medical history of Aortic insufficiency, Atrial flutter ([place]) (12/30/2020), Cataracts, bilateral (09/23/2015), Clostridium difficile infection (04/16/2017), Depression, Hyperlipidemia, Hypertension, OAB (overactive bladder) (12/16/2013), Osteopenia (08/05/2012), Seasonal allergies, and Urge and stress incontinence. Past Surgical History Patient  has a past surgical history that includes Cholecystectomy; Cesarean section; Teeth Implants;  Appendectomy (1971); Trigger finger release; Vaginal hysterectomy; Cardioversion (N/A, 04/13/2021); and A-FLUTTER ABLATION (N/A, 10/14/2021). Family History: Patient family history includes Brain cancer in her brother; Cancer in her father; Diabetes in her sister; Heart disease in her father, mother, and sister; Hypertension in her mother; Kidney cancer in her sister; Lung cancer in her sister. Social History:  Patient  reports that she has never smoked. She has never used smokeless tobacco. She reports that she does not drink alcohol and does not use drugs.  Review of Systems: Constitutional: negative for fever or malaise Ophthalmic: negative for photophobia, double vision or loss of vision Cardiovascular: negative for chest pain, dyspnea on exertion, or new LE swelling Respiratory: negative for SOB or persistent cough Gastrointestinal: negative for abdominal pain, change in bowel habits or melena Genitourinary: negative for dysuria or gross hematuria, no abnormal uterine bleeding or disharge Musculoskeletal: negative for new gait disturbance or muscular weakness Integumentary: negative for new or persistent rashes, no breast lumps Neurological: negative for TIA or stroke symptoms Psychiatric: negative for SI or delusions Allergic/Immunologic: negative for hives  Patient Care Team    Relationship Specialty Notifications Start End  Leamon Arnt, MD PCP - General Family Medicine  03/21/16   Berniece Salines, DO PCP - Cardiology Cardiology  12/29/20   Constance Haw, MD PCP - Electrophysiology Cardiology  04/29/21   Susette Racer, MD Consulting Physician Endocrinology  04/16/17   Carol Ada, MD Consulting Physician Gastroenterology  04/16/17   Roseanne Kaufman, MD Consulting Physician Orthopedic Surgery  02/28/18   Rolm Bookbinder, MD Consulting Physician Dermatology  02/28/18   Stephannie Li, [place] Physician Ophthalmology  03/17/19   [facility]  Consulting Physician Audiology  03/17/19   Madelin Rear, [facility] (Inactive) Pharmacist Pharmacist  07/29/20    Comment: Phone [phone]  Reece Agar, MD Consulting Physician Pain Medicine  03/02/21     Objective  Vitals: BP (!) 136/52   Pulse (!) 59   Temp 98.8 F (37.1 C)   Ht 5' (1.524 m)   Wt 178 lb 9.6 oz (81 kg)   SpO2 97%   BMI 34.88 kg/m  General:  Well developed, well nourished, no acute distress  Psych:  Alert and orientedx3,normal mood and affect HEENT:  Normocephalic, atraumatic, non-icteric sclera,  supple neck without adenopathy, mass or thyromegaly Cardiovascular:  Normal S1, S2, RRR without gallop, rub or murmur Respiratory:  Good breath sounds bilaterally, CTAB with normal respiratory effort Gastrointestinal: normal bowel sounds, soft, non-tender, no noted masses. No HSM MSK: no deformities, contusions. Joints are without erythema or swelling.  Skin:  Warm, no rashes or suspicious lesions noted Neurologic:    Mental status is normal. CN 2-11 are normal. Gross motor and sensory exams are normal. Normal gait. No tremor Breast Exam: pendulous, No mass, skin retraction or nipple discharge is appreciated in either breast. No axillary adenopathy. Fibrocystic changes are not noted  Commons side effects, risks, benefits, and alternatives for medications and treatment plan prescribed today were discussed, and the patient expressed understanding of the given instructions. Patient is instructed to call or message via MyChart if he/she has any questions or concerns regarding our treatment plan. No barriers to understanding were identified. We discussed Red Flag symptoms and signs in detail. Patient expressed understanding regarding what to do in case of urgent or emergency type symptoms.  Medication list was reconciled, printed and provided to the patient in AVS. Patient instructions and summary information was reviewed with the patient as documented in the AVS. This note was  prepared with assistance of Dragon voice recognition software. Occasional wrong-word or sound-a-like substitutions may have occurred due to the inherent limitations of voice recognition software

## 2022-05-03 NOTE — Patient Instructions (Signed)
Please return in 6 months for recheck  I will release your lab results to you on your MyChart account with further instructions. You may see the results before I do, but when I review them I will send you a message with my report or have my assistant call you if things need to be discussed. Please reply to my message with any questions. Thank you!   Please decrease the dose of your gabapentin and let me know if your side effects do not resolve.   If you have any questions or concerns, please don't hesitate to send me a message via MyChart or call the office at [phone]. Thank you for visiting with Martha Gilmore today! It's our pleasure caring for you.

## 2022-05-04 LAB — COMPLETE METABOLIC PANEL WITH GFR
AG Ratio: 1.5 (calc) (ref 1.0–2.5)
ALT: 11 U/L (ref 6–29)
AST: 16 U/L (ref 10–35)
Albumin: 3.9 g/dL (ref 3.6–5.1)
Alkaline phosphatase (APISO): 99 U/L (ref 37–153)
BUN: 17 mg/dL (ref 7–25)
CO2: 29 mmol/L (ref 20–32)
Calcium: 9.2 mg/dL (ref 8.6–10.4)
Chloride: 105 mmol/L (ref 98–110)
Creat: 0.85 mg/dL (ref 0.60–0.95)
Globulin: 2.6 g/dL (calc) (ref 1.9–3.7)
Glucose, Bld: 84 mg/dL (ref 65–99)
Potassium: 4.8 mmol/L (ref 3.5–5.3)
Sodium: 141 mmol/L (ref 135–146)
Total Bilirubin: 0.6 mg/dL (ref 0.2–1.2)
Total Protein: 6.5 g/dL (ref 6.1–8.1)
eGFR: 69 mL/min/{1.73_m2} (ref >=60)

## 2022-05-16 ENCOUNTER — Encounter: Payer: Self-pay | Admitting: Family Medicine

## 2022-05-22 ENCOUNTER — Ambulatory Visit (INDEPENDENT_AMBULATORY_CARE_PROVIDER_SITE_OTHER): Payer: Medicare Other | Admitting: *Deleted

## 2022-05-22 ENCOUNTER — Telehealth: Payer: Self-pay | Admitting: *Deleted

## 2022-05-22 DIAGNOSIS — I483 Typical atrial flutter: Secondary | ICD-10-CM

## 2022-05-22 DIAGNOSIS — E782 Mixed hyperlipidemia: Secondary | ICD-10-CM

## 2022-05-22 NOTE — Chronic Care Management (AMB) (Signed)
Chronic Care Management   CCM RN Visit Note  05/22/2022 Name: Martha Gilmore MRN: [id number] DOB: 1941/03/14  Subjective: Martha Gilmore is a 82 y.o. year old female who is a primary care patient of Leamon Arnt, MD. The patient was referred to the Chronic Care Management team for assistance with care management needs subsequent to provider initiation of CCM services and plan of care.    Today's Visit:  Engaged with patient by telephone for initial visit.     SDOH Interventions Today    Flowsheet Row Most Recent Value  SDOH Interventions   Food Insecurity Interventions Intervention Not Indicated  Housing Interventions Intervention Not Indicated  Transportation Interventions Intervention Not Indicated  Utilities Interventions Intervention Not Indicated  Financial Strain Interventions Intervention Not Indicated  Physical Activity Interventions Patient Refused  Stress Interventions Intervention Not Indicated  Social Connections Interventions Intervention Not Indicated         Goals Addressed             This Visit's Progress    CCM (ATRIAL FLUTTER) EXPECTED OUTCOME: MONITOR, SELF-MANAGE AND REDUCE SYMPTOMS OF ATRIAL FLUTTER       Current Barriers:  Knowledge Deficits related to Atrial Flutter management Chronic Disease Management support and education needs related to Atrial Flutter, action plan Patient reports she had ablation in the past and has been asymptomatic   Planned Interventions: Advised patient to discuss any issues/ exacerbation of Atrial Flutter with provider Counseled on importance of regular laboratory monitoring as prescribed Screening for signs and symptoms of depression related to chronic disease state Assessed social determinant of health barriers Reviewed importance of taking medications as prescribed Reviewed importance of exercise  Symptom Management: Take medications as prescribed   Attend all scheduled provider appointments Call pharmacy  for medication refills 3-7 days in advance of running out of medications Attend church or other social activities Perform all self care activities independently  Perform IADL's (shopping, preparing meals, housekeeping, managing finances) independently Call provider office for new concerns or questions  Try to do some type of exercise daily- walking is good    Follow Up Plan: Telephone follow up appointment with care management team member scheduled for:  07/25/22 at 1045 am       CCM (HYPERLIPIDEMIA) EXPECTED OUTCOME: MONITOR, SELF-MANAGE AND REDUCE SYMPTOMS OF HYPERLIPIDEMIA       Current Barriers:  Knowledge Deficits related to Hyperlipidemia management Chronic Disease Management support and education needs related to Hyperlipidemia, diet Patient reports she lives alone, has adult daughter that lives nearby that she can call of if needed Patient reports she is independent in all aspects of her care, continues to drive  Planned Interventions: Provider established cholesterol goals reviewed; Counseled on importance of regular laboratory monitoring as prescribed; Provided HLD educational materials; Reviewed importance of limiting foods high in cholesterol; Reviewed exercise goals and target of 150 minutes per week; Screening for signs and symptoms of depression related to chronic disease state;  Assessed social determinant of health barriers;   Symptom Management: Take medications as prescribed   Attend all scheduled provider appointments Call pharmacy for medication refills 3-7 days in advance of running out of medications Attend church or other social activities Perform all self care activities independently  Perform IADL's (shopping, preparing meals, housekeeping, managing finances) independently Call provider office for new concerns or questions  - call for medicine refill 2 or 3 days before it runs out - take all medications exactly as prescribed - call doctor with any symptoms  you believe are related to your medicine - call doctor when you experience any new symptoms - go to all doctor appointments as scheduled - adhere to prescribed diet: heart healthy - develop an exercise routine Look over education sent via my chart- heart healthy diet  Follow Up Plan: Telephone follow up appointment with care management team member scheduled for:  07/25/22 at 1045 am          Plan:Telephone follow up appointment with care management team member scheduled for:  07/25/22 at 1045 am  Jacqlyn Larsen [facility], BSN RN Case Manager [place] at [employer] [phone]

## 2022-05-22 NOTE — Plan of Care (Signed)
Chronic Care Management Provider Comprehensive Care Plan    05/22/2022 Name: Martha Gilmore MRN: [id number] DOB: 03/23/1941  Referral to Chronic Care Management (CCM) services was placed by Provider:  Billey Chang MD on Date: 03/20/22.  Chronic Condition 1: HYPERLIPIDEMIA Provider Assessment and Plan Hyperlipidemia - continue with current statin medication.    Expected Outcome/Goals Addressed This Visit (Provider CCM goals/Provider Assessment and plan  CCM (HYPERLIPIDEMIA) EXPECTED OUTCOME: MONITOR, SELF-MANAGE AND REDUCE SYMPTOMS OF HYPERLIPIDEMIA  Symptom Management Condition 1: Take medications as prescribed   Attend all scheduled provider appointments Call pharmacy for medication refills 3-7 days in advance of running out of medications Attend church or other social activities Perform all self care activities independently  Perform IADL's (shopping, preparing meals, housekeeping, managing finances) independently Call provider office for new concerns or questions  - call for medicine refill 2 or 3 days before it runs out - take all medications exactly as prescribed - call doctor with any symptoms you believe are related to your medicine - call doctor when you experience any new symptoms - go to all doctor appointments as scheduled - adhere to prescribed diet: heart healthy - develop an exercise routine Look over education sent via my chart- heart healthy diet  Chronic Condition 2: ATRIAL FLUTTER Provider Assessment and Plan  Atrial flutter - cont Cardizem and Eliquis. Schedule for atrial flutter ablation 6/23.   Expected Outcome/Goals Addressed This Visit (Provider CCM goals/Provider Assessment and plan  CCM (ATRIAL FLUTTER) EXPECTED OUTCOME: MONITOR, SELF-MANAGE AND REDUCE SYMPTOMS OF ATRIAL FLUTTER  Symptom Management Condition 2: Take medications as prescribed   Attend all scheduled provider appointments Call pharmacy for medication refills 3-7 days in advance of  running out of medications Attend church or other social activities Perform all self care activities independently  Perform IADL's (shopping, preparing meals, housekeeping, managing finances) independently Call provider office for new concerns or questions  Try to do some type of exercise daily- walking is good  Problem List Patient Active Problem List   Diagnosis Date Noted   Chronic neck pain 12/14/2021   Arthropathy of cervical facet joint 12/14/2021   Spondylosis, cervical 12/14/2021   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 ([place]) 05/02/2021   Nonrheumatic aortic valve insufficiency 04/07/2021   Obstructive sleep apnea 03/28/2021   Aortic insufficiency 01/18/2021   Atrial flutter ([place]) 12/30/2020   Chondromalacia, knee, right 11/05/2017   Tricompartment osteoarthritis of right knee 11/05/2017   Major depression, recurrent, chronic ([place]) 04/16/2017   Mixed hyperlipidemia 04/16/2017   Clostridium difficile infection 04/16/2017   Family history of early CAD 04/16/2017   Bilateral sensorineural hearing loss 02/06/2016   Postablative hypothyroidism 12/20/2015   History of Graves' disease 03/22/2015   OAB (overactive bladder) 12/16/2013   GERD (gastroesophageal reflux disease) 12/13/2012   Osteoarthritis of CMC joint of thumb 08/13/2012   Seasonal allergies 08/13/2012   Urge and stress incontinence 08/13/2012   Osteopenia 08/05/2012   DDD (degenerative disc disease), lumbosacral 10/22/2010    Medication Management  Current Outpatient Medications:    atorvastatin (LIPITOR) 10 MG tablet, TAKE 1 TABLET BY MOUTH EVERYDAY AT BEDTIME, Disp: 90 tablet, Rfl: 3   citalopram (CELEXA) 20 MG tablet, TAKE 1 TABLET BY MOUTH EVERY DAY, Disp: 90 tablet, Rfl: 3   gabapentin (NEURONTIN) 300 MG capsule, Take 2 capsules (600 mg total) by mouth 3 (three) times daily., Disp: 540 capsule, Rfl: 3   levothyroxine (SYNTHROID) 100 MCG tablet, Mon- Sat 100 mcg daily, and  Sun 150 mcg daily, Disp: ,  Rfl:    Multiple Vitamins-Minerals (PRESERVISION AREDS PO), Take 1 capsule by mouth in the morning and at bedtime., Disp: , Rfl:    omeprazole (PRILOSEC) 20 MG capsule, TAKE 1 CAPSULE BY MOUTH EVERY DAY, Disp: 90 capsule, Rfl: 3   oxybutynin (DITROPAN-XL) 10 MG 24 hr tablet, TAKE 1 TABLET BY MOUTH AT  BEDTIME, Disp: 90 tablet, Rfl: 3   Polyethyl Glycol-Propyl Glycol (SYSTANE OP), Place 1 drop into both eyes daily as needed (DRY EYES)., Disp: , Rfl:    traMADol (ULTRAM) 50 MG tablet, TAKE 1 TABLET BY MOUTH THREE TIMES A DAY AS NEEDED, Disp: 60 tablet, Rfl: 5  Cognitive Assessment Identity Confirmed: : Name; DOB Cognitive Status: Normal   Functional Assessment Hearing Difficulty or Deaf: yes Hearing Management: bil hearing aides Wear Glasses or Blind: yes Vision Management: can see well w/ glasses Concentrating, Remembering or Making Decisions Difficulty (CP): no Difficulty Communicating: no Difficulty Eating/Swallowing: no Walking or Climbing Stairs Difficulty: no Dressing/Bathing Difficulty: no Doing Errands Independently Difficulty (such as shopping) (CP): no   Caregiver Assessment  Primary Source of Support/Comfort: child(ren) Name of Support/Comfort Primary Source: daughter lives nearby [place] in Home: alone   Planned Interventions  Advised patient to discuss any issues/ exacerbation of Atrial Flutter with provider Counseled on importance of regular laboratory monitoring as prescribed Screening for signs and symptoms of depression related to chronic disease state Assessed social determinant of health barriers Reviewed importance of taking medications as prescribed Reviewed importance of exercise Provider established cholesterol goals reviewed; Counseled on importance of regular laboratory monitoring as prescribed; Provided HLD educational materials; Reviewed importance of limiting foods high in cholesterol; Reviewed exercise  goals and target of 150 minutes per week; Screening for signs and symptoms of depression related to chronic disease state;  Assessed social determinant of health barriers;   Interaction and coordination with outside resources, practitioners, and providers See CCM Referral  Care Plan: Available in MyChart

## 2022-05-22 NOTE — Patient Instructions (Signed)
Please call the care guide team at [phone] if you need to cancel or reschedule your appointment.   If you are experiencing a Mental Health or [facility] or need someone to talk to, please call the Suicide and Crisis Lifeline: 988 call the [place] National Suicide Prevention Lifeline: 4255610250 or TTY: (334)263-9954 TTY (346)640-4623) to talk to a trained counselor call 1-800-273-TALK (toll free, 24 hour hotline) go to [facility] [address] [place] [id number]) call 911   Following is a copy of the CCM Program Consent:  CCM service includes personalized support from designated clinical staff supervised by the physician, including individualized plan of care and coordination with other care providers 24/7 contact phone numbers for assistance for urgent and routine care needs. Service will only be billed when office clinical staff spend 20 minutes or more in a month to coordinate care. Only one practitioner may furnish and bill the service in a calendar month. The patient may stop CCM services at amy time (effective at the end of the month) by phone call to the office staff. The patient will be responsible for cost sharing (co-pay) or up to 20% of the service fee (after annual deductible is met)  Following is a copy of your full provider care plan:   Goals Addressed             This Visit's Progress    CCM (ATRIAL FLUTTER) EXPECTED OUTCOME: MONITOR, SELF-MANAGE AND REDUCE SYMPTOMS OF ATRIAL FLUTTER       Current Barriers:  Knowledge Deficits related to Atrial Flutter management Chronic Disease Management support and education needs related to Atrial Flutter, action plan Patient reports she had ablation in the past and has been asymptomatic   Planned Interventions: Advised patient to discuss any issues/ exacerbation of Atrial Flutter with provider Counseled on importance of regular laboratory monitoring as  prescribed Screening for signs and symptoms of depression related to chronic disease state Assessed social determinant of health barriers Reviewed importance of taking medications as prescribed Reviewed importance of exercise  Symptom Management: Take medications as prescribed   Attend all scheduled provider appointments Call pharmacy for medication refills 3-7 days in advance of running out of medications Attend church or other social activities Perform all self care activities independently  Perform IADL's (shopping, preparing meals, housekeeping, managing finances) independently Call provider office for new concerns or questions  Try to do some type of exercise daily- walking is good    Follow Up Plan: Telephone follow up appointment with care management team member scheduled for:  07/25/22 at 1045 am       CCM (HYPERLIPIDEMIA) EXPECTED OUTCOME: MONITOR, SELF-MANAGE AND REDUCE SYMPTOMS OF HYPERLIPIDEMIA       Current Barriers:  Knowledge Deficits related to Hyperlipidemia management Chronic Disease Management support and education needs related to Hyperlipidemia, diet Patient reports she lives alone, has adult daughter that lives nearby that she can call of if needed Patient reports she is independent in all aspects of her care, continues to drive  Planned Interventions: Provider established cholesterol goals reviewed; Counseled on importance of regular laboratory monitoring as prescribed; Provided HLD educational materials; Reviewed importance of limiting foods high in cholesterol; Reviewed exercise goals and target of 150 minutes per week; Screening for signs and symptoms of depression related to chronic disease state;  Assessed social determinant of health barriers;   Symptom Management: Take medications as prescribed   Attend all scheduled provider appointments Call pharmacy for medication refills 3-7 days  in advance of running out of medications Attend church or other  social activities Perform all self care activities independently  Perform IADL's (shopping, preparing meals, housekeeping, managing finances) independently Call provider office for new concerns or questions  - call for medicine refill 2 or 3 days before it runs out - take all medications exactly as prescribed - call doctor with any symptoms you believe are related to your medicine - call doctor when you experience any new symptoms - go to all doctor appointments as scheduled - adhere to prescribed diet: heart healthy - develop an exercise routine Look over education sent via my chart- heart healthy diet  Follow Up Plan: Telephone follow up appointment with care management team member scheduled for:  07/25/22 at 1045 am          Patient verbalizes understanding of instructions and care plan provided today and agrees to view in [facility]. Active MyChart status and patient understanding of how to access instructions and care plan via MyChart confirmed with patient.     Atrial Flutter  Atrial flutter is a type of abnormal heart rhythm (arrhythmia). The heart has an electrical system that tells it how to beat. In atrial flutter, the signals move rapidly in the top chambers of the heart (the atria). This makes your heart beat very fast. Atrial flutter can come and go, or it can be permanent. The goal of treatment is to prevent blood clots from forming, control your heart rate, or restore your heartbeat to a normal rhythm. If this condition is not treated, it can cause serious problems, such as a weakened heart muscle (cardiomyopathy) or a stroke. What are the causes? This condition is often caused by conditions that damage the heart's electrical system. These include: Heart conditions and heart surgery. These include heart attacks and open-heart surgery. Lung problems, such as COPD or a blood clot in the lung (pulmonary embolism, or PE). Poorly controlled high blood pressure  (hypertension). Overactive thyroid (hyperthyroidism). Diabetes. In some cases, the cause of this condition is not known. What increases the risk? You are more likely to develop this condition if: You are an elderly adult. You are a man. You are overweight (obese). You have obstructive sleep apnea. You have a family history of atrial flutter. You have diabetes. You drink a lot of alcohol, especially binge drinking. You use drugs, including cannabis. You smoke. What are the signs or symptoms? Symptoms of this condition include: A feeling that your heart is pounding or racing (palpitations). Shortness of breath. Chest pain. Feeling dizzy or light-headed. Fainting. Low blood pressure (hypotension). Fatigue. Tiring easily during exercise or activity. In some cases, there are no symptoms. How is this diagnosed? This condition may be diagnosed with: An electrocardiogram (ECG) to check electrical signals of the heart. An ambulatory cardiac monitor to record your heart's activity for a few days. An echocardiogram to create pictures of your heart. A transesophageal echocardiogram (TEE) to create even better pictures of your heart. A stress test to check your blood supply while you exercise. Imaging tests, such as a CT scan or chest X-ray. Blood tests. How is this treated? Treatment depends on underlying conditions and how you feel when you experience atrial flutter. This condition may be treated with: Medicines to prevent blood clots or to treat heart rate or heart rhythm problems. Electrical cardioversion to reset the heart's rhythm. Ablation to remove the heart tissue that sends abnormal signals. Left atrial appendage closure to seal the area where blood clots  can form. In some cases, underlying conditions will be treated. Follow these instructions at home: Medicines Take over-the-counter and prescription medicines only as told by your health care provider. Do not take any new  medicines without talking to your health care provider. If you are taking blood thinners: Talk with your health care provider before you take any medicines that contain aspirin or NSAIDs, such as ibuprofen. These medicines increase your risk for dangerous bleeding. Take your medicine exactly as told, at the same time every day. Avoid activities that could cause injury or bruising, and follow instructions about how to prevent falls. Wear a medical alert bracelet or carry a card that lists what medicines you take. Lifestyle Eat heart-healthy foods. Talk with a dietitian to make an eating plan that is right for you. Do not use any products that contain nicotine or tobacco, such as cigarettes, e-cigarettes, and chewing tobacco. If you need help quitting, ask your health care provider. Do not drink alcohol. Do not use drugs, including cannabis. Lose weight if you are overweight or obese. Exercise regularly as instructed by your health care provider. General instructions Do not use diet pills unless your health care provider approves. Diet pills may make heart problems worse. If you have obstructive sleep apnea, manage your condition as told by your health care provider. Keep all follow-up visits as told by your health care provider. This is important. Contact a health care provider if you: Notice a change in the rate, rhythm, or strength of your heartbeat. Are taking a blood thinner and you notice more bruising. Have a sudden change in weight. Tire more easily when you exercise or do heavy work. Get help right away if you have: Pain or pressure in your chest. Shortness of breath. Fainting. Increasing sweating with no known cause. Side effects of blood thinners, such as blood in your vomit, stool, or urine, or bleeding that cannot stop. Any symptoms of a stroke. "BE FAST" is an easy way to remember the main warning signs of a stroke: B - Balance. Signs are dizziness, sudden trouble walking, or  loss of balance. E - Eyes. Signs are trouble seeing or a sudden change in vision. F - Face. Signs are sudden weakness or numbness of the face, or the face or eyelid drooping on one side. A - Arms. Signs are weakness or numbness in an arm. This happens suddenly and usually on one side of the body. S - Speech. Signs are sudden trouble speaking, slurred speech, or trouble understanding what people say. T - Time. Time to call emergency services. Write down what time symptoms started. Other signs of a stroke, such as: A sudden, severe headache with no known cause. Nausea or vomiting. Seizure. These symptoms may represent a serious problem that is an emergency. Do not wait to see if the symptoms will go away. Get medical help right away. Call your local emergency services (911 in the [place] Do not drive yourself to the hospital. Summary Atrial flutter is an abnormal heart rhythm that can give you symptoms of palpitations, shortness of breath, or fatigue. Atrial flutter is often treated with medicines to keep your heart in a normal rhythm and to prevent a stroke. Get help right away if you cannot catch your breath, or have chest pain or pressure. Get help right away if you have signs or symptoms of a stroke. This information is not intended to replace advice given to you by your health care provider. Make sure you discuss any  questions you have with your health care provider. Document Revised: 10/02/2018 Document Reviewed: 10/02/2018 Elsevier Patient Education  [place] Many factors influence your heart health, including eating and exercise habits. Heart health is also called coronary health. Coronary risk increases with abnormal blood fat (lipid) levels. A heart-healthy eating plan includes limiting unhealthy fats, increasing healthy fats, limiting salt (sodium) intake, and making other diet and lifestyle changes. What is my plan? Your health care provider may  recommend that: You limit your fat intake to _________% or less of your total calories each day. You limit your saturated fat intake to _________% or less of your total calories each day. You limit the amount of cholesterol in your diet to less than _________ mg per day. You limit the amount of sodium in your diet to less than _________ mg per day. What are tips for following this plan? Cooking Cook foods using methods other than frying. Baking, boiling, grilling, and broiling are all good options. Other ways to reduce fat include: Removing the skin from poultry. Removing all visible fats from meats. Steaming vegetables in water or broth. Meal planning  At meals, imagine dividing your plate into fourths: Fill one-half of your plate with vegetables and green salads. Fill one-fourth of your plate with whole grains. Fill one-fourth of your plate with lean protein foods. Eat 2-4 cups of vegetables per day. One cup of vegetables equals 1 cup (91 g) broccoli or cauliflower florets, 2 medium carrots, 1 large bell pepper, 1 large sweet potato, 1 large tomato, 1 medium white potato, 2 cups (150 g) raw leafy greens. Eat 1-2 cups of fruit per day. One cup of fruit equals 1 small apple, 1 large banana, 1 cup (237 g) mixed fruit, 1 large orange,  cup (82 g) dried fruit, 1 cup (240 mL) 100% fruit juice. Eat more foods that contain soluble fiber. Examples include apples, broccoli, carrots, beans, peas, and barley. Aim to get 25-30 g of fiber per day. Increase your consumption of legumes, nuts, and seeds to 4-5 servings per week. One serving of dried beans or legumes equals  cup (90 g) cooked, 1 serving of nuts is  oz (12 almonds, 24 pistachios, or 7 walnut halves), and 1 serving of seeds equals  oz (8 g). Fats Choose healthy fats more often. Choose monounsaturated and polyunsaturated fats, such as olive and canola oils, avocado oil, flaxseeds, walnuts, almonds, and seeds. Eat more omega-3 fats. Choose  salmon, mackerel, sardines, tuna, flaxseed oil, and ground flaxseeds. Aim to eat fish at least 2 times each week. Check food labels carefully to identify foods with trans fats or high amounts of saturated fat. Limit saturated fats. These are found in animal products, such as meats, butter, and cream. Plant sources of saturated fats include palm oil, palm kernel oil, and coconut oil. Avoid foods with partially hydrogenated oils in them. These contain trans fats. Examples are stick margarine, some tub margarines, cookies, crackers, and other baked goods. Avoid fried foods. General information Eat more home-cooked food and less restaurant, buffet, and fast food. Limit or avoid alcohol. Limit foods that are high in added sugar and simple starches such as foods made using white refined flour (white breads, pastries, sweets). Lose weight if you are overweight. Losing just 5-10% of your body weight can help your overall health and prevent diseases such as diabetes and heart disease. Monitor your sodium intake, especially if you have high blood pressure. Talk with your health care provider  about your sodium intake. Try to incorporate more vegetarian meals weekly. What foods should I eat? Fruits All fresh, canned (in natural juice), or frozen fruits. Vegetables Fresh or frozen vegetables (raw, steamed, roasted, or grilled). Green salads. Grains Most grains. Choose whole wheat and whole grains most of the time. Rice and pasta, including brown rice and pastas made with whole wheat. Meats and other proteins Lean, well-trimmed beef, veal, pork, and lamb. Chicken and [place] without skin. All fish and shellfish. Wild duck, rabbit, pheasant, and venison. Egg whites or low-cholesterol egg substitutes. Dried beans, peas, lentils, and tofu. Seeds and most nuts. Dairy Low-fat or nonfat cheeses, including ricotta and mozzarella. Skim or 1% milk (liquid, powdered, or evaporated). Buttermilk made with low-fat milk.  Nonfat or low-fat yogurt. Fats and oils Non-hydrogenated (trans-free) margarines. Vegetable oils, including soybean, sesame, sunflower, olive, avocado, peanut, safflower, corn, canola, and cottonseed. Salad dressings or mayonnaise made with a vegetable oil. Beverages Water (mineral or sparkling). Coffee and tea. Unsweetened ice tea. Diet beverages. Sweets and desserts Sherbet, gelatin, and fruit ice. Small amounts of dark chocolate. Limit all sweets and desserts. Seasonings and condiments All seasonings and condiments. The items listed above may not be a complete list of foods and beverages you can eat. Contact a dietitian for more options. What foods should I avoid? Fruits Canned fruit in heavy syrup. Fruit in cream or butter sauce. Fried fruit. Limit coconut. Vegetables Vegetables cooked in cheese, cream, or butter sauce. Fried vegetables. Grains Breads made with saturated or trans fats, oils, or whole milk. Croissants. Sweet rolls. Donuts. High-fat crackers, such as cheese crackers and chips. Meats and other proteins Fatty meats, such as hot dogs, ribs, sausage, bacon, rib-eye roast or steak. High-fat deli meats, such as salami and bologna. Caviar. Domestic duck and goose. Organ meats, such as liver. Dairy Cream, sour cream, cream cheese, and creamed cottage cheese. Whole-milk cheeses. Whole or 2% milk (liquid, evaporated, or condensed). Whole buttermilk. Cream sauce or high-fat cheese sauce. Whole-milk yogurt. Fats and oils Meat fat, or shortening. Cocoa butter, hydrogenated oils, palm oil, coconut oil, palm kernel oil. Solid fats and shortenings, including bacon fat, salt pork, lard, and butter. Nondairy cream substitutes. Salad dressings with cheese or sour cream. Beverages Regular sodas and any drinks with added sugar. Sweets and desserts Frosting. Pudding. Cookies. Cakes. Pies. Milk chocolate or white chocolate. Buttered syrups. Full-fat ice cream or ice cream drinks. The items  listed above may not be a complete list of foods and beverages to avoid. Contact a dietitian for more information. Summary Heart-healthy meal planning includes limiting unhealthy fats, increasing healthy fats, limiting salt (sodium) intake and making other diet and lifestyle changes. Lose weight if you are overweight. Losing just 5-10% of your body weight can help your overall health and prevent diseases such as diabetes and heart disease. Focus on eating a balance of foods, including fruits and vegetables, low-fat or nonfat dairy, lean protein, nuts and legumes, whole grains, and heart-healthy oils and fats. This information is not intended to replace advice given to you by your health care provider. Make sure you discuss any questions you have with your health care provider. Document Revised: 05/16/2021 Document Reviewed: 05/16/2021 Elsevier Patient Education  [place].   Telephone follow up appointment with care management team member scheduled for:  07/25/22 at 1045 am

## 2022-05-22 NOTE — Telephone Encounter (Signed)
   CCM RN Visit Note   05/22/22  Name: Sharlene San Gilmore MRN: [id number]      DOB: 1940/10/16  Subjective: Martha Gilmore is a 82 y.o. year old female who is a primary care patient of Billey Chang MD. The patient was referred to the Chronic Care Management team for assistance with care management needs subsequent to provider initiation of CCM services and plan of care.      An unsuccessful telephone outreach was attempted today to contact the patient about Chronic Care Management needs.    Plan:Telephone follow up appointment with care management team member scheduled for:  upon care guide rescheduling.  Jacqlyn Larsen RNC, BSN RN Case Manager [place] at [facility] [phone]

## 2022-05-24 DIAGNOSIS — E782 Mixed hyperlipidemia: Secondary | ICD-10-CM

## 2022-05-28 ENCOUNTER — Other Ambulatory Visit: Payer: Self-pay | Admitting: Family Medicine

## 2022-06-22 ENCOUNTER — Ambulatory Visit (INDEPENDENT_AMBULATORY_CARE_PROVIDER_SITE_OTHER): Payer: Medicare Other

## 2022-06-22 VITALS — Wt 178.0 lb

## 2022-06-22 DIAGNOSIS — Z0000 Encounter for general adult medical examination without abnormal findings: Secondary | ICD-10-CM

## 2022-06-22 NOTE — Progress Notes (Addendum)
I connected with  Martha Gilmore on 06/22/22 by a audio enabled telemedicine application and verified that I am speaking with the correct person using two identifiers.  Patient Location: Home  Provider Location: Office/Clinic  I discussed the limitations of evaluation and management by telemedicine. The patient expressed understanding and agreed to proceed.   Subjective:   Martha Gilmore is a 82 y.o. female who presents for Medicare Annual (Subsequent) preventive examination.  Review of Systems     Cardiac Risk Factors include: advanced age (>38mn, >>46women);dyslipidemia;obesity (BMI >30kg/m2)     Objective:    Today's Vitals   06/22/22 0847  Weight: 178 lb (80.7 kg)   Body mass index is 34.76 kg/m.     06/22/2022    8:53 AM 05/22/2022    3:47 PM 10/14/2021   12:06 PM 06/09/2021    8:51 AM 05/06/2021    8:38 PM 04/13/2021    8:46 AM 03/04/2021    9:27 PM  Advanced Directives  Does Patient Have a Medical Advance Directive? Yes Yes Yes Yes Yes Yes Yes  Type of AParamedicof APainterLiving will Living will;Healthcare Power of AWilliamsonLiving will  Does patient want to make changes to medical advance directive?  No - Patient declined     No - Patient declined  Copy of HLoganin Chart? No - copy requested No - copy requested  No - copy requested Yes - validated most recent copy scanned in chart (See row information)  No - copy requested    Current Medications (verified) Outpatient Encounter Medications as of 06/22/2022  Medication Sig   atorvastatin (LIPITOR) 10 MG tablet TAKE 1 TABLET BY MOUTH EVERYDAY AT BEDTIME   citalopram (CELEXA) 20 MG tablet TAKE 1 TABLET BY MOUTH EVERY DAY   gabapentin (NEURONTIN) 300 MG capsule Take 2 capsules (600 mg total) by mouth 3 (three) times daily. (Patient taking differently: Take 300 mg by mouth 3 (three)  times daily. 600 at bedtime)   levothyroxine (SYNTHROID) 100 MCG tablet Mon- Sat 100 mcg daily, and Sun 150 mcg daily (Patient taking differently: 100 mcg 1 day or 1 dose.)   Multiple Vitamins-Minerals (PRESERVISION AREDS PO) Take 1 capsule by mouth in the morning and at bedtime.   omeprazole (PRILOSEC) 20 MG capsule TAKE 1 CAPSULE BY MOUTH EVERY DAY   oxybutynin (DITROPAN-XL) 10 MG 24 hr tablet TAKE 1 TABLET BY MOUTH AT  BEDTIME   Polyethyl Glycol-Propyl Glycol (SYSTANE OP) Place 1 drop into both eyes daily as needed (DRY EYES).   traMADol (ULTRAM) 50 MG tablet TAKE 1 TABLET BY MOUTH THREE TIMES A DAY AS NEEDED   No facility-administered encounter medications on file as of 06/22/2022.    Allergies (verified) Aspirin, Demerol  [meperidine hcl], Penicillins, Statins, Cephalexin, Clindamycin/lincomycin, and Tape   History: Past Medical History:  Diagnosis Date   Aortic insufficiency    mild to moderate by echo 12/2020   Atrial flutter (HCrook 12/30/2020   Cataracts, bilateral 09/23/2015   Clostridium difficile infection 04/16/2017   H/o C.diff due to clindamicyin 09/2016; two rounds of treatment for cure. Dr. HBenson [place]  Depression    Hyperlipidemia    Hypertension    OAB (overactive bladder) 12/16/2013   Osteopenia 08/05/2012   Dexa scan 07/2012 mild osteopenia, T = -1.1   Seasonal allergies    Urge and stress incontinence    Past Surgical History:  Procedure Laterality Date   A-FLUTTER ABLATION N/A 10/14/2021   Procedure: A-FLUTTER ABLATION;  Surgeon: Constance Haw, MD;  Location: [facility];  Service: Cardiovascular;  Laterality: N/A;   APPENDECTOMY  1971   CARDIOVERSION N/A 04/13/2021   Procedure: CARDIOVERSION;  Surgeon: Werner Lean, MD;  Location: [facility];  Service: Cardiovascular;  Laterality: N/A;   CESAREAN SECTION     CHOLECYSTECTOMY     Teeth Implants     TRIGGER FINGER RELEASE     VAGINAL HYSTERECTOMY     Family History  Problem Relation Age  of Onset   Heart disease Mother    Hypertension Mother    Cancer Father    Heart disease Father    Lung cancer Sister    Heart disease Sister    Diabetes Sister    Kidney cancer Sister    Brain cancer Brother    Social History   Socioeconomic History   Marital status: Widowed    Spouse name: Not on file   Number of children: Not on file   Years of education: Not on file   Highest education level: Not on file  Occupational History   Not on file  Tobacco Use   Smoking status: Never   Smokeless tobacco: Never  Vaping Use   Vaping Use: Never used  Substance and Sexual Activity   Alcohol use: No   Drug use: No   Sexual activity: Never    Partners: Male  Other Topics Concern   Not on file  Social History Narrative   Not on file   Social Determinants of Health   Financial Resource Strain: Low Risk  (06/21/2022)   Overall Financial Resource Strain (CARDIA)    Difficulty of Paying Living Expenses: Not hard at all  Food Insecurity: No Food Insecurity (06/21/2022)   Hunger Vital Sign    Worried About Running Out of Food in the Last Year: Never true    [place] in the Last Year: Never true  Transportation Needs: No Transportation Needs (06/21/2022)   PRAPARE - Hydrologist (Medical): No    Lack of Transportation (Non-Medical): No  Physical Activity: Inactive (06/21/2022)   Exercise Vital Sign    Days of Exercise per Week: 0 days    Minutes of Exercise per Session: 0 min  Stress: No Stress Concern Present (06/21/2022)   [place]    Feeling of Stress : Not at all  Social Connections: Moderately Integrated (06/21/2022)   Social Connection and Isolation Panel [NHANES]    Frequency of Communication with Friends and Family: More than three times a week    Frequency of Social Gatherings with Friends and Family: Once a week    Attends Religious Services: More than 4 times per year     Active Member of [employer] or Organizations: Yes    Attends Archivist Meetings: More than 4 times per year    Marital Status: Widowed    Tobacco Counseling Counseling given: Not Answered   Clinical Intake:  Pre-visit preparation completed: Yes  Pain : No/denies pain     BMI - recorded: 34.76 Nutritional Status: BMI > 30  Obese Nutritional Risks: None Diabetes: No  How often do you need to have someone help you when you read instructions, pamphlets, or other written materials from your doctor or pharmacy?: 1 - Never  Diabetic?no  Interpreter Needed?: No  Information entered by ::  Charlott Rakes, LPN   Activities of Daily Living    06/21/2022   11:41 PM  In your present state of health, do you have any difficulty performing the following activities:  Hearing? 0  Vision? 0  Difficulty concentrating or making decisions? 0  Walking or climbing stairs? 0  Dressing or bathing? 0  Doing errands, shopping? 0  Preparing Food and eating ? N  Using the Toilet? N  In the past six months, have you accidently leaked urine? Y  Do you have problems with loss of bowel control? N  Managing your Medications? N  Managing your Finances? N  Housekeeping or managing your Housekeeping? N    Patient Care Team: Leamon Arnt, MD as PCP - General (Family Medicine) Berniece Salines, DO as PCP - Cardiology (Cardiology) Constance Haw, MD as PCP - Electrophysiology (Cardiology) Susette Racer, MD as Consulting Physician (Endocrinology) Carol Ada, MD as Consulting Physician (Gastroenterology) Roseanne Kaufman, MD as Consulting Physician (Orthopedic Surgery) Rolm Bookbinder, MD as Consulting Physician (Dermatology) Stephannie Li, OD as Consulting Physician (Ophthalmology) [facility] as Consulting Physician (Audiology) Madelin Rear, [facility] (Inactive) as Pharmacist (Pharmacist) Reece Agar, MD as Consulting Physician (Pain Medicine) Kassie Mends, RN as [place] any recent [place] you may have received from other than [facility] providers in the past year (date may be approximate).     Assessment:   This is a routine wellness examination for Martha Gilmore.  Hearing/Vision screen Hearing Screening - Comments:: Pt wears hearing aids  Vision Screening - Comments:: Pt follows up with Dr Alesia Banda for annual eye exams   Dietary issues and exercise activities discussed: Current Exercise Habits: The patient does not participate in regular exercise at present   Goals Addressed             This Visit's Progress    Patient Stated       Started weight watchers        Depression Screen    06/22/2022    8:51 AM 05/22/2022    3:44 PM 05/03/2022    8:28 AM 04/07/2022   10:20 AM 12/14/2021    2:59 PM 06/09/2021    8:50 AM 03/02/2021    2:22 PM  PHQ 2/9 Scores  PHQ - 2 Score 0 0 0 0 0 0 0  PHQ- 9 Score       0    Fall Risk    06/21/2022   11:41 PM 05/22/2022    3:47 PM 05/03/2022    8:28 AM 04/07/2022   10:20 AM 03/07/2022   11:16 AM  Fall Risk   Falls in the past year? 0 0 0 0 1  Number falls in past yr: 0  0 0 0  Injury with Fall? 0  0 0 1  Comment     hurt hip and done PT.  Risk for fall due to : Impaired vision  No Fall Risks  No Fall Risks  Risk for fall due to: Comment     missed the chair  Follow up Falls prevention discussed  Falls evaluation completed  Falls evaluation completed    [place]:   Any stairs in or around the home? Yes  If so, are there any without handrails? No  Home free of loose throw rugs in walkways, pet beds, electrical cords, etc? Yes  Adequate lighting in your home to reduce risk of falls? Yes  ASSISTIVE DEVICES UTILIZED TO PREVENT FALLS:  Life alert? No  Use of a cane, walker or w/c? Yes  Grab bars in the bathroom? No  Shower chair or bench in shower? No  Elevated toilet seat or a handicapped toilet?  No   TIMED UP AND GO:  Was the test performed? No .   Cognitive Function:    02/28/2018   11:09 AM  MMSE - Mini Mental State Exam  Orientation to time 5  Orientation to Place 5  Registration 3  Attention/ Calculation 5  Recall 2  Language- name 2 objects 2  Language- repeat 1  Language- follow 3 step command 3  Language- read & follow direction 1  Write a sentence 1  Copy design 1  Total score 29        06/22/2022    8:55 AM 06/09/2021    8:56 AM 06/03/2020    1:59 PM  6CIT Screen  What Year? 0 points 0 points 0 points  What month? 0 points 0 points 0 points  What time? 0 points 0 points   Count back from 20 0 points 0 points 0 points  Months in reverse 0 points 0 points 0 points  Repeat phrase 0 points 2 points 2 points  Total Score 0 points 2 points     Immunizations Immunization History  Administered Date(s) Administered   Fluad Quad(high Dose 65+) 01/13/2021, 12/28/2021   Influenza Split 01/15/2012   Influenza, High Dose Seasonal PF 01/30/2013, 01/05/2015, 02/17/2016, 02/02/2017, 01/18/2018, 12/11/2018, 12/23/2019, 01/13/2021   Influenza, Seasonal, Injecte, Preservative Fre 01/21/2014   Influenza-Unspecified 01/09/2011, 01/15/2012, 01/21/2014, 01/22/2018   PFIZER(Purple Top)SARS-COV-2 Vaccination 05/15/2019, 06/05/2019, 01/20/2020, 08/16/2020   Pfizer Covid-19 Vaccine Bivalent Booster 70yr & up 01/14/2021   Pneumococcal Conjugate-13 01/05/2015   Pneumococcal Polysaccharide-23 04/24/2006, 11/18/2007, 05/30/2012   Respiratory Syncytial Virus Vaccine,Recomb Aduvanted(Arexvy) 03/28/2022   Tdap 08/17/2010, 05/29/2022   Zoster Recombinat (Shingrix) 06/29/2018, 11/01/2018   Zoster, Live 04/25/1995    TDAP status: Up to date  Flu Vaccine status: Up to date  Pneumococcal vaccine status: Up to date  Covid-19 vaccine status: Completed vaccines  Qualifies for Shingles Vaccine? Yes   Zostavax completed Yes   Shingrix Completed?: Yes  Screening Tests Health  Maintenance  Topic Date Due   COVID-19 Vaccine (6 - 2023-24 season) 12/23/2021   MAMMOGRAM  05/05/2022   Medicare Annual Wellness (AWV)  06/22/2023   DEXA SCAN  08/18/2023   Pneumonia Vaccine 82 Years old  Completed   INFLUENZA VACCINE  Completed   Zoster Vaccines- Shingrix  Completed   HPV VACCINES  Aged Out   DTaP/Tdap/Td  Discontinued    Health Maintenance  Health Maintenance Due  Topic Date Due   COVID-19 Vaccine (6 - 2023-24 season) 12/23/2021   MAMMOGRAM  05/05/2022    Colorectal cancer screening: No longer required.   Mammogram status: Completed 05/05/21. Repeat every year  Bone Density status: Completed 08/17/20. Results reflect: Bone density results: OSTEOPENIA. Repeat every 3 years.   Additional Screening:   Vision Screening: Recommended annual ophthalmology exams for early detection of glaucoma and other disorders of the eye. Is the patient up to date with their annual eye exam?  Yes  Who is the provider or what is the name of the office in which the patient attends annual eye exams? Dr MAlesia Banda If pt is not established with a provider, would they like to be referred to a provider to establish care? No .   Dental Screening: Recommended annual  dental exams for proper oral hygiene  Community Resource Referral / Chronic Care Management: CRR required this visit?  No   CCM required this visit?  No      Plan:     I have personally reviewed and noted the following in the patient's chart:   Medical and social history Use of alcohol, tobacco or illicit drugs  Current medications and supplements including opioid prescriptions. Patient is not currently taking opioid prescriptions. Functional ability and status Nutritional status Physical activity Advanced directives List of other physicians Hospitalizations, surgeries, and ER visits in previous 12 months Vitals Screenings to include cognitive, depression, and falls Referrals and appointments  In  addition, I have reviewed and discussed with patient certain preventive protocols, quality metrics, and best practice recommendations. A written personalized care plan for preventive services as well as general preventive health recommendations were provided to patient.     Willette Brace, LPN   [id number]   Nurse Notes: none

## 2022-06-22 NOTE — Progress Notes (Signed)
I connected with  Martha Gilmore on 06/22/22 by a audio enabled telemedicine application and verified that I am speaking with the correct person using two identifiers.  Patient Location: Home  Provider Location: Office/Clinic  I discussed the limitations of evaluation and management by telemedicine. The patient expressed understanding and agreed to proceed.    Patient Medicare AWV questionnaire was completed by the patient on 06/21/22; I have confirmed that all information answered by patient is correct and no changes since this date.        Subjective:   Martha Gilmore is a 82 y.o. female who presents for Medicare Annual (Subsequent) preventive examination.  Review of Systems     Cardiac Risk Factors include: advanced age (>13mn, >>39women);dyslipidemia;obesity (BMI >30kg/m2)     Objective:    Today's Vitals   06/22/22 0847  Weight: 178 lb (80.7 kg)   Body mass index is 34.76 kg/m.     06/22/2022    8:53 AM 05/22/2022    3:47 PM 10/14/2021   12:06 PM 06/09/2021    8:51 AM 05/06/2021    8:38 PM 04/13/2021    8:46 AM 03/04/2021    9:27 PM  Advanced Directives  Does Patient Have a Medical Advance Directive? Yes Yes Yes Yes Yes Yes Yes  Type of [place]aramed[place]of [place] will Living will;Healthcare Power of [place] will  Does patient want to make changes to medical advance directive?  No - Patient declined     No - Patient declined  Copy of [place] Chart? No - copy requested No - copy requested  No - copy requested Yes - validated most recent copy scanned in chart (See row information)  No - copy requested    Current Medications (verified) Outpatient Encounter Medications as of 06/22/2022  Medication Sig   atorvastatin (LIPITOR) 10 MG tablet TAKE 1 TABLET BY MOUTH EVERYDAY AT BEDTIME   citalopram (CELEXA) 20 MG tablet TAKE 1  TABLET BY MOUTH EVERY DAY   gabapentin (NEURONTIN) 300 MG capsule Take 2 capsules (600 mg total) by mouth 3 (three) times daily. (Patient taking differently: Take 300 mg by mouth 3 (three) times daily. 600 at bedtime)   levothyroxine (SYNTHROID) 100 MCG tablet Mon- Sat 100 mcg daily, and Sun 150 mcg daily (Patient taking differently: 100 mcg 1 day or 1 dose.)   Multiple Vitamins-Minerals (PRESERVISION AREDS PO) Take 1 capsule by mouth in the morning and at bedtime.   omeprazole (PRILOSEC) 20 MG capsule TAKE 1 CAPSULE BY MOUTH EVERY DAY   oxybutynin (DITROPAN-XL) 10 MG 24 hr tablet TAKE 1 TABLET BY MOUTH AT  BEDTIME   Polyethyl Glycol-Propyl Glycol (SYSTANE OP) Place 1 drop into both eyes daily as needed (DRY EYES).   traMADol (ULTRAM) 50 MG tablet TAKE 1 TABLET BY MOUTH THREE TIMES A DAY AS NEEDED   No facility-administered encounter medications on file as of 06/22/2022.    Allergies (verified) Aspirin, Demerol  [meperidine hcl], Penicillins, Statins, Cephalexin, Clindamycin/lincomycin, and Tape   History: Past Medical History:  Diagnosis Date   Aortic insufficiency    mild to moderate by echo 12/2020   [place]rial [place]utter [place] 12/30/2020   Cataracts, bilateral 09/23/2015   Clostridium difficile infection 04/16/2017   H/o C.diff due to clindamicyin 09/2016; two rounds of treatment for cure. Dr. HBenson [place]  Depression    Hyperlipidemia    Hypertension  OAB (overactive bladder) 12/16/2013   Osteopenia 08/05/2012   Dexa scan 07/2012 mild osteopenia, T = -1.1   Seasonal allergies    Urge and stress incontinence    Past Surgical History:  Procedure Laterality Date   A-FLUTTER ABLATION N/A 10/14/2021   Procedure: A-FLUTTER ABLATION;  Surgeon: Constance Haw, MD;  Location: [place] [facility];  Service: Cardiovascular;  Laterality: N/A;   APPENDECTOMY  1971   CARDIOVERSION N/A 04/13/2021   Procedure: CARDIOVERSION;  Surgeon: Werner Lean, MD;  Location: [facility];  Service:  Cardiovascular;  Laterality: N/A;   CESAREAN SECTION     CHOLECYSTECTOMY     Teeth Implants     TRIGGER FINGER RELEASE     VAGINAL HYSTERECTOMY     Family History  Problem Relation Age of Onset   Heart disease Mother    Hypertension Mother    Cancer Father    Heart disease Father    Lung cancer Sister    Heart disease Sister    Diabetes Sister    Kidney cancer Sister    Brain cancer Brother    Social History   Socioeconomic History   Marital status: Widowed    Spouse name: Not on file   Number of children: Not on file   Years of education: Not on file   Highest education level: Not on file  Occupational History   Not on file  Tobacco Use   Smoking status: Never   Smokeless tobacco: Never  Vaping Use   Vaping Use: Never used  Substance and Sexual Activity   Alcohol use: No   Drug use: No   Sexual activity: Never    Partners: Male  Other Topics Concern   Not on file  Social History Narrative   Not on file   Social Determinants of Health   Financial Resource Strain: Low Risk  (06/21/2022)   Overall Financial Resource Strain (CARDIA)    Difficulty of Paying Living Expenses: Not hard at all  Food Insecurity: No Food Insecurity (06/21/2022)   Hunger Vital Sign    Worried About Running Out of Food in the Last Year: Never true    [place] in the Last Year: Never true  Transportation Needs: No Transportation Needs (06/21/2022)   PRAPARE - Hydrologist (Medical): No    Lack of Transportation (Non-Medical): No  Physical Activity: Inactive (06/21/2022)   Exercise Vital Sign    Days of Exercise per Week: 0 days    Minutes of Exercise per Session: 0 min  Stress: No Stress Concern Present (06/21/2022)   [place]    Feeling of Stress : Not at all  Social Connections: Moderately Integrated (06/21/2022)   Social Connection and Isolation Panel [NHANES]    Frequency of  Communication with Friends and Family: More than three times a week    Frequency of Social Gatherings with Friends and Family: Once a week    Attends Religious Services: More than 4 times per year    Active Member of [employer] or Organizations: Yes    Attends Archivist Meetings: More than 4 times per year    Marital Status: Widowed    Tobacco Counseling Counseling given: Not Answered   Clinical Intake:  Pre-visit preparation completed: Yes  Pain : No/denies pain     BMI - recorded: 34.76 Nutritional Status: BMI > 30  Obese Nutritional Risks: None Diabetes: No  How  often do you need to have someone help you when you read instructions, pamphlets, or other written materials from your doctor or pharmacy?: 1 - Never  Diabetic?no  Interpreter Needed?: No  Information entered by :: Charlott Rakes, LPN   Activities of Daily Living    06/21/2022   11:41 PM  In your present state of health, do you have any difficulty performing the following activities:  Hearing? 0  Vision? 0  Difficulty concentrating or making decisions? 0  Walking or climbing stairs? 0  Dressing or bathing? 0  Doing errands, shopping? 0  Preparing Food and eating ? N  Using the Toilet? N  In the past six months, have you accidently leaked urine? Y  Do you have problems with loss of bowel control? N  Managing your Medications? N  Managing your Finances? N  Housekeeping or managing your Housekeeping? N    Patient Care Team: Leamon Arnt, MD as PCP - General (Family Medicine) Berniece Salines, DO as PCP - Cardiology (Cardiology) Constance Haw, MD as PCP - Electrophysiology (Cardiology) Susette Racer, MD as Consulting Physician (Endocrinology) Carol Ada, MD as Consulting Physician (Gastroenterology) Roseanne Kaufman, MD as Consulting Physician (Orthopedic Surgery) Rolm Bookbinder, MD as Consulting Physician (Dermatology) Stephannie Li, OD as Consulting Physician (Ophthalmology) [facility] as Consulting Physician (Audiology) Madelin Rear, [facility] (Inactive) as Pharmacist (Pharmacist) Reece Agar, MD as Consulting Physician (Pain Medicine) Kassie Mends, RN as [place] any recent [place] you may have received from other than [facility] providers in the past year (date may be approximate).     Assessment:   This is a routine wellness examination for Martha Gilmore.  Hearing/Vision screen Hearing Screening - Comments:: Pt wears hearing aids  Vision Screening - Comments:: Pt follows up with Dr Alesia Banda for annual eye exams   Dietary issues and exercise activities discussed: Current Exercise Habits: The patient does not participate in regular exercise at present   Goals Addressed             This Visit's Progress    Patient Stated       Started weight watchers       Depression Screen    06/22/2022    8:51 AM 05/22/2022    3:44 PM 05/03/2022    8:28 AM 04/07/2022   10:20 AM 12/14/2021    2:59 PM 06/09/2021    8:50 AM 03/02/2021    2:22 PM  PHQ 2/9 Scores  PHQ - 2 Score 0 0 0 0 0 0 0  PHQ- 9 Score       0    Fall Risk    06/21/2022   11:41 PM 05/22/2022    3:47 PM 05/03/2022    8:28 AM 04/07/2022   10:20 AM 03/07/2022   11:16 AM  Fall Risk   Falls in the past year? 0 0 0 0 1  Number falls in past yr: 0  0 0 0  Injury with Fall? 0  0 0 1  Comment     hurt hip and done PT.  Risk for fall due to : Impaired vision  No Fall Risks  No Fall Risks  Risk for fall due to: Comment     missed the chair  Follow up Falls prevention discussed  Falls evaluation completed  Falls evaluation completed    [place]:   Any stairs in or around the home? Yes  If so, are there any without handrails? No  Home free of loose throw rugs in walkways, pet beds, electrical cords, etc? Yes  Adequate lighting in your home to reduce risk of falls? Yes   ASSISTIVE DEVICES  UTILIZED TO PREVENT FALLS:  Life alert? No  Use of a cane, walker or w/c? Yes  Grab bars in the bathroom? No  Shower chair or bench in shower? No  Elevated toilet seat or a handicapped toilet? No   TIMED UP AND GO:  Was the test performed? No .   Cognitive Function:    02/28/2018   11:09 AM  MMSE - Mini Mental State Exam  Orientation to time 5  Orientation to Place 5  Registration 3  Attention/ Calculation 5  Recall 2  Language- name 2 objects 2  Language- repeat 1  Language- follow 3 step command 3  Language- read & follow direction 1  Write a sentence 1  Copy design 1  Total score 29        06/22/2022    8:55 AM 06/09/2021    8:56 AM 06/03/2020    1:59 PM  6CIT Screen  What Year? 0 points 0 points 0 points  What month? 0 points 0 points 0 points  What time? 0 points 0 points   Count back from 20 0 points 0 points 0 points  Months in reverse 0 points 0 points 0 points  Repeat phrase 0 points 2 points 2 points  Total Score 0 points 2 points     Immunizations Immunization History  Administered Date(s) Administered   Fluad Quad(high Dose 65+) 01/13/2021, 12/28/2021   Influenza Split 01/15/2012   Influenza, High Dose Seasonal PF 01/30/2013, 01/05/2015, 02/17/2016, 02/02/2017, 01/18/2018, 12/11/2018, 12/23/2019, 01/13/2021   Influenza, Seasonal, Injecte, Preservative Fre 01/21/2014   Influenza-Unspecified 01/09/2011, 01/15/2012, 01/21/2014, 01/22/2018   PFIZER(Purple Top)SARS-COV-2 Vaccination 05/15/2019, 06/05/2019, 01/20/2020, 08/16/2020   Pfizer Covid-19 Vaccine Bivalent Booster 69yr & up 01/14/2021   Pneumococcal Conjugate-13 01/05/2015   Pneumococcal Polysaccharide-23 04/24/2006, 11/18/2007, 05/30/2012   Respiratory Syncytial Virus Vaccine,Recomb Aduvanted(Arexvy) 03/28/2022   Tdap 08/17/2010, 05/29/2022   Zoster Recombinat (Shingrix) 06/29/2018, 11/01/2018   Zoster, Live 04/25/1995    TDAP status: Up to date  Flu Vaccine status: Up to  date  Pneumococcal vaccine status: Up to date  Covid-19 vaccine status: Completed vaccines  Qualifies for Shingles Vaccine? Yes   Zostavax completed Yes   Shingrix Completed?: Yes  Screening Tests Health Maintenance  Topic Date Due   COVID-19 Vaccine (6 - 2023-24 season) 12/23/2021   MAMMOGRAM  05/05/2022   Medicare Annual Wellness (AWV)  06/22/2023   DEXA SCAN  08/18/2023   Pneumonia Vaccine 82 Years old  Completed   INFLUENZA VACCINE  Completed   Zoster Vaccines- Shingrix  Completed   HPV VACCINES  Aged Out   DTaP/Tdap/Td  Discontinued    Health Maintenance  Health Maintenance Due  Topic Date Due   COVID-19 Vaccine (6 - 2023-24 season) 12/23/2021   MAMMOGRAM  05/05/2022    Colorectal cancer screening: No longer required.   Mammogram status: Completed 05/05/21. Repeat every year  Bone Density status: Completed 08/17/20. Results reflect: Bone density results: OSTEOPENIA. Repeat every 3 years.   Additional Screening:   Vision Screening: Recommended annual ophthalmology exams for early detection of glaucoma and other disorders of the eye. Is the patient up to date with their annual eye exam?  Yes  Who is the provider or what is the name of the office in which  the patient attends annual eye exams? Dr Alesia Banda  If pt is not established with a provider, would they like to be referred to a provider to establish care? No .   Dental Screening: Recommended annual dental exams for proper oral hygiene  Community Resource Referral / Chronic Care Management: CRR required this visit?  No   CCM required this visit?  No      Plan:     I have personally reviewed and noted the following in the patient's chart:   Medical and social history Use of alcohol, tobacco or illicit drugs  Current medications and supplements including opioid prescriptions. Patient is not currently taking opioid prescriptions. Functional ability and status Nutritional status Physical  activity Advanced directives List of other physicians Hospitalizations, surgeries, and ER visits in previous 12 months Vitals Screenings to include cognitive, depression, and falls Referrals and appointments  In addition, I have reviewed and discussed with patient certain preventive protocols, quality metrics, and best practice recommendations. A written personalized care plan for preventive services as well as general preventive health recommendations were provided to patient.     Willette Brace, LPN   [id number]   Nurse Notes: none

## 2022-06-22 NOTE — Patient Instructions (Signed)
Martha Gilmore , Thank you for taking time to come for your Medicare Wellness Visit. I appreciate your ongoing commitment to your health goals. Please review the following plan we discussed and let me know if I can assist you in the future.   These are the goals we discussed:  Goals      CCM (ATRIAL FLUTTER) EXPECTED OUTCOME: MONITOR, SELF-MANAGE AND REDUCE SYMPTOMS OF ATRIAL FLUTTER     Current Barriers:  Knowledge Deficits related to Atrial Flutter management Chronic Disease Management support and education needs related to Atrial Flutter, action plan Patient reports she had ablation in the past and has been asymptomatic   Planned Interventions: Advised patient to discuss any issues/ exacerbation of Atrial Flutter with provider Counseled on importance of regular laboratory monitoring as prescribed Screening for signs and symptoms of depression related to chronic disease state Assessed social determinant of health barriers Reviewed importance of taking medications as prescribed Reviewed importance of exercise  Symptom Management: Take medications as prescribed   Attend all scheduled provider appointments Call pharmacy for medication refills 3-7 days in advance of running out of medications Attend church or other social activities Perform all self care activities independently  Perform IADL's (shopping, preparing meals, housekeeping, managing finances) independently Call provider office for new concerns or questions  Try to do some type of exercise daily- walking is good    Follow Up Plan: Telephone follow up appointment with care management team member scheduled for:  07/25/22 at 1045 am       CCM (HYPERLIPIDEMIA) EXPECTED OUTCOME: MONITOR, SELF-MANAGE AND REDUCE SYMPTOMS OF HYPERLIPIDEMIA     Current Barriers:  Knowledge Deficits related to Hyperlipidemia management Chronic Disease Management support and education needs related to Hyperlipidemia, diet Patient reports she lives alone,  has adult daughter that lives nearby that she can call of if needed Patient reports she is independent in all aspects of her care, continues to drive  Planned Interventions: Provider established cholesterol goals reviewed; Counseled on importance of regular laboratory monitoring as prescribed; Provided HLD educational materials; Reviewed importance of limiting foods high in cholesterol; Reviewed exercise goals and target of 150 minutes per week; Screening for signs and symptoms of depression related to chronic disease state;  Assessed social determinant of health barriers;   Symptom Management: Take medications as prescribed   Attend all scheduled provider appointments Call pharmacy for medication refills 3-7 days in advance of running out of medications Attend church or other social activities Perform all self care activities independently  Perform IADL's (shopping, preparing meals, housekeeping, managing finances) independently Call provider office for new concerns or questions  - call for medicine refill 2 or 3 days before it runs out - take all medications exactly as prescribed - call doctor with any symptoms you believe are related to your medicine - call doctor when you experience any new symptoms - go to all doctor appointments as scheduled - adhere to prescribed diet: heart healthy - develop an exercise routine Look over education sent via my chart- heart healthy diet  Follow Up Plan: Telephone follow up appointment with care management team member scheduled for:  07/25/22 at 1045 am       CCM Expected Outcome:  Monitor, Self-Manage and Reduce Symptoms of:     Increase physical activity     Increase activity     Patient Stated     Lose weight      Patient Stated     Lose weight and wear hearing aids daily  Patient Stated     Started weight watchers         This is a list of the screening recommended for you and due dates:  Health Maintenance  Topic Date Due    COVID-19 Vaccine (6 - 2023-24 season) 12/23/2021   Mammogram  05/05/2022   Medicare Annual Wellness Visit  06/22/2023   DEXA scan (bone density measurement)  08/18/2023   Pneumonia Vaccine  Completed   Flu Shot  Completed   Zoster (Shingles) Vaccine  Completed   HPV Vaccine  Aged Out   DTaP/Tdap/Td vaccine  Discontinued    Advanced directives: Please bring a copy of your health care power of attorney and living will to the office at your convenience.   Conditions/risks identified: continue with weight watchers   Next appointment: Follow up in one year for your annual wellness visit     Preventive Care 65 Years and Older, Female Preventive care refers to lifestyle choices and visits with your health care provider that can promote health and wellness. What does preventive care include? A yearly physical exam. This is also called an annual well check. Dental exams once or twice a year. Routine eye exams. Ask your health care provider how often you should have your eyes checked. Personal lifestyle choices, including: Daily care of your teeth and gums. Regular physical activity. Eating a healthy diet. Avoiding tobacco and drug use. Limiting alcohol use. Practicing safe sex. Taking low-dose aspirin every day. Taking vitamin and mineral supplements as recommended by your health care provider. What happens during an annual well check? The services and screenings done by your health care provider during your annual well check will depend on your age, overall health, lifestyle risk factors, and family history of disease. Counseling  Your health care provider may ask you questions about your: Alcohol use. Tobacco use. Drug use. Emotional well-being. Home and relationship well-being. Sexual activity. Eating habits. History of falls. Memory and ability to understand (cognition). Work and work Statistician. Reproductive health. Screening  You may have the following tests or  measurements: Height, weight, and BMI. Blood pressure. Lipid and cholesterol levels. These may be checked every 5 years, or more frequently if you are over 20 years old. Skin check. Lung cancer screening. You may have this screening every year starting at age 36 if you have a 30-pack-year history of smoking and currently smoke or have quit within the past 15 years. Fecal occult blood test (FOBT) of the stool. You may have this test every year starting at age 69. Flexible sigmoidoscopy or colonoscopy. You may have a sigmoidoscopy every 5 years or a colonoscopy every 10 years starting at age 48. Hepatitis C blood test. Hepatitis B blood test. Sexually transmitted disease (STD) testing. Diabetes screening. This is done by checking your blood sugar (glucose) after you have not eaten for a while (fasting). You may have this done every 1-3 years. Bone density scan. This is done to screen for osteoporosis. You may have this done starting at age 18. Mammogram. This may be done every 1-2 years. Talk to your health care provider about how often you should have regular mammograms. Talk with your health care provider about your test results, treatment options, and if necessary, the need for more tests. Vaccines  Your health care provider may recommend certain vaccines, such as: Influenza vaccine. This is recommended every year. Tetanus, diphtheria, and acellular pertussis (Tdap, Td) vaccine. You may need a Td booster every 10 years. Zoster vaccine. You may  need this after age 77. Pneumococcal 13-valent conjugate (PCV13) vaccine. One dose is recommended after age 37. Pneumococcal polysaccharide (PPSV23) vaccine. One dose is recommended after age 55. Talk to your health care provider about which screenings and vaccines you need and how often you need them. This information is not intended to replace advice given to you by your health care provider. Make sure you discuss any questions you have with your  health care provider. Document Released: 05/07/2015 Document Revised: 12/29/2015 Document Reviewed: 02/09/2015 Elsevier Interactive Patient Education  2017 [place] Prevention in the Home Falls can cause injuries. They can happen to people of all ages. There are many things you can do to make your home safe and to help prevent falls. What can I do on the outside of my home? Regularly fix the edges of walkways and driveways and fix any cracks. Remove anything that might make you trip as you walk through a door, such as a raised step or threshold. Trim any bushes or trees on the path to your home. Use bright outdoor lighting. Clear any walking paths of anything that might make someone trip, such as rocks or tools. Regularly check to see if handrails are loose or broken. Make sure that both sides of any steps have handrails. Any raised decks and porches should have guardrails on the edges. Have any leaves, snow, or ice cleared regularly. Use sand or salt on walking paths during winter. Clean up any spills in your garage right away. This includes oil or grease spills. What can I do in the bathroom? Use night lights. Install grab bars by the toilet and in the tub and shower. Do not use towel bars as grab bars. Use non-skid mats or decals in the tub or shower. If you need to sit down in the shower, use a plastic, non-slip stool. Keep the floor dry. Clean up any water that spills on the floor as soon as it happens. Remove soap buildup in the tub or shower regularly. Attach bath mats securely with double-sided non-slip rug tape. Do not have throw rugs and other things on the floor that can make you trip. What can I do in the bedroom? Use night lights. Make sure that you have a light by your bed that is easy to reach. Do not use any sheets or blankets that are too big for your bed. They should not hang down onto the floor. Have a firm chair that has side arms. You can use this for  support while you get dressed. Do not have throw rugs and other things on the floor that can make you trip. What can I do in the kitchen? Clean up any spills right away. Avoid walking on wet floors. Keep items that you use a lot in easy-to-reach places. If you need to reach something above you, use a strong step stool that has a grab bar. Keep electrical cords out of the way. Do not use floor polish or wax that makes floors slippery. If you must use wax, use non-skid floor wax. Do not have throw rugs and other things on the floor that can make you trip. What can I do with my stairs? Do not leave any items on the stairs. Make sure that there are handrails on both sides of the stairs and use them. Fix handrails that are broken or loose. Make sure that handrails are as long as the stairways. Check any carpeting to make sure that it is firmly attached  to the stairs. Fix any carpet that is loose or worn. Avoid having throw rugs at the top or bottom of the stairs. If you do have throw rugs, attach them to the floor with carpet tape. Make sure that you have a light switch at the top of the stairs and the bottom of the stairs. If you do not have them, ask someone to add them for you. What else can I do to help prevent falls? Wear shoes that: Do not have high heels. Have rubber bottoms. Are comfortable and fit you well. Are closed at the toe. Do not wear sandals. If you use a stepladder: Make sure that it is fully opened. Do not climb a closed stepladder. Make sure that both sides of the stepladder are locked into place. Ask someone to hold it for you, if possible. Clearly mark and make sure that you can see: Any grab bars or handrails. First and last steps. Where the edge of each step is. Use tools that help you move around (mobility aids) if they are needed. These include: Canes. Walkers. Scooters. Crutches. Turn on the lights when you go into a dark area. Replace any light bulbs as soon  as they burn out. Set up your furniture so you have a clear path. Avoid moving your furniture around. If any of your floors are uneven, fix them. If there are any pets around you, be aware of where they are. Review your medicines with your doctor. Some medicines can make you feel dizzy. This can increase your chance of falling. Ask your doctor what other things that you can do to help prevent falls. This information is not intended to replace advice given to you by your health care provider. Make sure you discuss any questions you have with your health care provider. Document Released: 02/04/2009 Document Revised: 09/16/2015 Document Reviewed: 05/15/2014 Elsevier Interactive Patient Education  2017 [employer].

## 2022-06-23 ENCOUNTER — Ambulatory Visit
Admission: RE | Admit: 2022-06-23 | Discharge: 2022-06-23 | Disposition: A | Discharge status: Home / Self Care | Payer: Medicare Other | Source: Ambulatory Visit | Attending: Family Medicine | Admitting: Family Medicine

## 2022-06-23 DIAGNOSIS — Z1231 Encounter for screening mammogram for malignant neoplasm of breast: Secondary | ICD-10-CM

## 2022-06-28 ENCOUNTER — Ambulatory Visit: Payer: Medicare Other | Admitting: Family Medicine

## 2022-06-28 ENCOUNTER — Encounter: Payer: Self-pay | Admitting: Family Medicine

## 2022-06-28 VITALS — BP 160/60 | HR 64 | Temp 97.8°F | Ht 60.0 in | Wt 183.6 lb

## 2022-06-28 DIAGNOSIS — M45A1 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: Secondary | ICD-10-CM | POA: Diagnosis not present

## 2022-06-28 DIAGNOSIS — M542 Cervicalgia: Secondary | ICD-10-CM

## 2022-06-28 DIAGNOSIS — M792 Neuralgia and neuritis, unspecified: Secondary | ICD-10-CM

## 2022-06-28 DIAGNOSIS — G8929 Other chronic pain: Secondary | ICD-10-CM

## 2022-06-28 DIAGNOSIS — R030 Elevated blood-pressure reading, without diagnosis of hypertension: Secondary | ICD-10-CM | POA: Diagnosis not present

## 2022-06-28 MED ORDER — PREGABALIN 150 MG PO CAPS: 150.0000 mg | ORAL_CAPSULE | Freq: Two times a day (BID) | ORAL | 5 refills | Status: DC

## 2022-06-28 MED ORDER — NORTRIPTYLINE HCL 25 MG PO CAPS: 25.0000 mg | ORAL_CAPSULE | Freq: Every day | ORAL | 3 refills | Status: DC

## 2022-06-28 MED ORDER — LEVOTHYROXINE SODIUM 100 MCG PO TABS: 100.0000 ug | ORAL_TABLET | Freq: Every day | ORAL | Status: AC

## 2022-06-28 NOTE — Progress Notes (Signed)
Subjective  CC:  Chief Complaint  Patient presents with   Neck Pain    Pt stated that she has been having some neck and head pain.    HPI: Martha Gilmore is a 82 y.o. female who presents to the office today to address the problems listed above in the chief complaint. Worsening neuropathic left sided head pain in spite of gabapentin and tramadol. Now sore scalp. Can't tolerate gabapentin > 300 bid and 600 at night: has cognitive side effects if takes more. Tramadol once or twice a day helps but not enough at this point. Neck pain is persistent. Has seen SM and NS. Has OA.  Has failed cymbalta. Remains on celexa for mood control. Has tried accupuncture for neck pain.  No clear reason for worsening pain. ? Weather related. In pain today.   bp: elevated today with pain response. No current dx of HTN (resolved in past)  Assessment  1. Neuropathic pain   2. 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 Chronic neck pain   4. Elevated blood pressure reading without diagnosis of hypertension      Plan  Neuropathic pain:  change to lyrica start at 150 bid and try to titrate to 300 bid if tolerates. Add pamelor 25 at night. Discussed caution and possible side effects. Consider accupuncture again; refer to neuro: ? Botox could be helpful.  Tramadol for severe neck pain as needed. Failed NS eval/injections.  Monitor bp.   Follow up: 6-8 weeks for recheck.   07/25/2022  Orders Placed This Encounter  Procedures   Ambulatory referral to Neurology   Meds ordered this encounter  Medications   pregabalin (LYRICA) 150 MG capsule    Sig: Take 1 capsule (150 mg total) by mouth 2 (two) times daily.    Dispense:  60 capsule    Refill:  5   nortriptyline (PAMELOR) 25 MG capsule    Sig: Take 1 capsule (25 mg total) by mouth at bedtime.    Dispense:  90 capsule    Refill:  3   levothyroxine (SYNTHROID) 100 MCG tablet    Sig: Take 1 tablet (100 mcg total) by  mouth daily before breakfast. Mon- Sat 100 mcg daily, and Sun 150 mcg daily      I reviewed the patients updated PMH, FH, and SocHx.    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 [facility]) 05/02/2021    Priority: High   Obstructive sleep apnea 03/28/2021    Priority: High   Atrial flutter ([place]) 12/30/2020    Priority: High   Major depression, recurrent, chronic ([place]) 04/16/2017    Priority: High   Mixed hyperlipidemia 04/16/2017    Priority: High   Postablative hypothyroidism 12/20/2015    Priority: High   Chondromalacia, knee, right 11/05/2017    Priority: Medium    Tricompartment osteoarthritis of right knee 11/05/2017    Priority: Medium    History of Graves' disease 03/22/2015    Priority: Medium    GERD (gastroesophageal reflux disease) 12/13/2012    Priority: Medium    Osteopenia 08/05/2012    Priority: Medium    DDD (degenerative disc disease), lumbosacral 10/22/2010    Priority: Medium    Nonrheumatic aortic valve insufficiency  04/07/2021    Priority: Low   Aortic insufficiency 01/18/2021    Priority: Low   Bilateral sensorineural hearing loss 02/06/2016    Priority: Low   OAB (overactive bladder) 12/16/2013    Priority: Low   Osteoarthritis of CMC joint of thumb 08/13/2012    Priority: Low   Seasonal allergies 08/13/2012    Priority: Low   Urge and stress incontinence 08/13/2012    Priority: Low   Clostridium difficile infection 04/16/2017   Family history of early CAD 04/16/2017   Current Meds  Medication Sig   atorvastatin (LIPITOR) 10 MG tablet TAKE 1 TABLET BY MOUTH EVERYDAY AT BEDTIME   citalopram (CELEXA) 20 MG tablet TAKE 1 TABLET BY MOUTH EVERY DAY   Multiple Vitamins-Minerals (PRESERVISION AREDS  PO) Take 1 capsule by mouth in the morning and at bedtime.   nortriptyline (PAMELOR) 25 MG capsule Take 1 capsule (25 mg total) by mouth at bedtime.   omeprazole (PRILOSEC) 20 MG capsule TAKE 1 CAPSULE BY MOUTH EVERY DAY   oxybutynin (DITROPAN-XL) 10 MG 24 hr tablet TAKE 1 TABLET BY MOUTH AT  BEDTIME   Polyethyl Glycol-Propyl Glycol (SYSTANE OP) Place 1 drop into both eyes daily as needed (DRY EYES).   pregabalin (LYRICA) 150 MG capsule Take 1 capsule (150 mg total) by mouth 2 (two) times daily.   traMADol (ULTRAM) 50 MG tablet TAKE 1 TABLET BY MOUTH THREE TIMES A DAY AS NEEDED   [DISCONTINUED] gabapentin (NEURONTIN) 300 MG capsule Take 2 capsules (600 mg total) by mouth 3 (three) times daily. (Patient taking differently: Take 300 mg by mouth 3 (three) times daily. 600 at bedtime)   [DISCONTINUED] levothyroxine (SYNTHROID) 100 MCG tablet Mon- Sat 100 mcg daily, and Sun 150 mcg daily (Patient taking differently: 100 mcg 1 day or 1 dose.)    Allergies: Patient is allergic to aspirin, demerol  [meperidine hcl], penicillins, statins, cephalexin, clindamycin/lincomycin, and tape. Family History: Patient family history includes Brain cancer in her brother; Cancer in her father; Diabetes in her sister; Heart disease in her father, mother, and sister; Hypertension in her mother; Kidney cancer in her sister; Lung cancer in her sister. Social History:  Patient  reports that she has never smoked. She has never used smokeless tobacco. She reports that she does not drink alcohol and does not use drugs.  Review of Systems: Constitutional: Negative for fever malaise or anorexia Cardiovascular: negative for chest pain Respiratory: negative for SOB or persistent cough Gastrointestinal: negative for abdominal pain  Objective  Vitals: BP (!) 160/60   Pulse 64   Temp 97.8 F (36.6 C)   Ht 5' (1.524 m)   Wt 183 lb 9.6 oz (83.3 kg)   SpO2 96%   BMI 35.86 kg/m  General: no acute distress , A&Ox3 But  appears to be uncomfortable.    Commons side effects, risks, benefits, and alternatives for medications and treatment plan prescribed today were discussed, and the patient expressed understanding of the given instructions. Patient is instructed to call or message via MyChart if he/she has any questions or concerns regarding our treatment plan. No barriers to understanding were identified. We discussed Red Flag symptoms and signs in detail. Patient expressed understanding regarding what to do in case of urgent or emergency type symptoms.  Medication list was reconciled, printed and provided to the patient in AVS. Patient instructions and summary information was reviewed with the patient as documented in the AVS. This note was prepared with assistance of Dragon voice recognition software.  Occasional wrong-word or sound-a-like substitutions may have occurred due to the inherent limitations of voice recognition software

## 2022-06-28 NOTE — Assessment & Plan Note (Signed)
Managed by Dr. Steffanie Dunn, endocrinology

## 2022-06-28 NOTE — Patient Instructions (Signed)
Please follow up if symptoms do not improve or as needed.   

## 2022-07-06 ENCOUNTER — Encounter: Payer: Self-pay | Admitting: Family Medicine

## 2022-07-10 ENCOUNTER — Encounter: Payer: Self-pay | Admitting: Family Medicine

## 2022-07-12 ENCOUNTER — Encounter: Payer: Self-pay | Admitting: Family Medicine

## 2022-07-12 ENCOUNTER — Ambulatory Visit: Payer: Medicare Other | Admitting: Family Medicine

## 2022-07-12 VITALS — BP 150/60 | HR 68 | Temp 98.2°F | Ht 60.0 in | Wt 179.4 lb

## 2022-07-12 DIAGNOSIS — M792 Neuralgia and neuritis, unspecified: Secondary | ICD-10-CM | POA: Diagnosis not present

## 2022-07-12 DIAGNOSIS — M542 Cervicalgia: Secondary | ICD-10-CM | POA: Diagnosis not present

## 2022-07-12 DIAGNOSIS — T887XXA Unspecified adverse effect of drug or medicament, initial encounter: Secondary | ICD-10-CM

## 2022-07-12 DIAGNOSIS — M45A1 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: Secondary | ICD-10-CM | POA: Diagnosis not present

## 2022-07-12 DIAGNOSIS — G8929 Other chronic pain: Secondary | ICD-10-CM

## 2022-07-12 NOTE — Progress Notes (Signed)
Subjective  CC:  Chief Complaint  Patient presents with   Medication Problem    Pt stated that she has been having some issues with Lyrica. She stated that she can not remember stuff, and feels anxious all the time and can  not stay focus on one thing     HPI: Martha Gilmore is a 82 y.o. female who presents to the office today to address the problems listed above in the chief complaint. Patient returns for chronic neck pain and neuropathic pain.  Last visit, few weeks ago, we discussed changing medication regimen to see if we can get better control of her neuropathic and neck pain.  Changed from gabapentin twice daily to Lyrica and amitriptyline with tramadol as needed.  However did not tolerate amitriptyline.  Now on Lyrica 150 twice daily and feels off.  Cannot think clearly.  Loses train of thought.  Feelings anxious.  Does feel it has helped her neuropathic pain.  Does not have neck pain.  No new symptoms.  No severe headaches.  Cognition is clear.  Assessment  1. Neuropathic pain   2. Chronic neck pain   3. 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 Medication side effects      Plan  Neuropathic and osteoarthritic neck pain: Ongoing for several years.  Has had complete and thorough workup.  Has been to sports medicine and neurosurgery.  Has failed steroid injections.  Has failed muscle relaxers.  Tramadol gabapentin helped but not completely.  Currently not tolerating need medications aimed at treating neuropathic pain.  At this time, refer to pain management to see if we can get her doing better.  Stop Lyrica.  Return to gabapentin and use tramadol if needed.  Follow up: July for follow-up   Orders Placed This Encounter  Procedures   Ambulatory referral to Pain Clinic   No orders of the defined types were placed in this encounter.     I reviewed the patients updated PMH, FH, and SocHx.    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 [facility]) 05/02/2021    Priority: High   Obstructive sleep apnea 03/28/2021    Priority: High   Atrial flutter ([place]) 12/30/2020    Priority: High   Major depression, recurrent, chronic ([place]) 04/16/2017    Priority: High   Mixed hyperlipidemia 04/16/2017    Priority: High   Postablative hypothyroidism 12/20/2015    Priority: High   Chondromalacia, knee, right 11/05/2017    Priority: Medium    Tricompartment osteoarthritis of right knee 11/05/2017    Priority: Medium    History of Graves' disease 03/22/2015    Priority: Medium    GERD (gastroesophageal reflux disease) 12/13/2012    Priority: Medium    Osteopenia 08/05/2012    Priority: Medium    DDD (degenerative disc disease), lumbosacral 10/22/2010    Priority: Medium    Nonrheumatic aortic valve insufficiency 04/07/2021    Priority: Low   Aortic insufficiency 01/18/2021    Priority: Low   Bilateral sensorineural hearing loss 02/06/2016    Priority: Low   OAB (overactive bladder) 12/16/2013    Priority: Low   Osteoarthritis of CMC joint of thumb 08/13/2012    Priority: Low  Seasonal allergies 08/13/2012    Priority: Low   Urge and stress incontinence 08/13/2012    Priority: Low   Clostridium difficile infection 04/16/2017   Family history of early CAD 04/16/2017   Current Meds  Medication Sig   atorvastatin (LIPITOR) 10 MG tablet TAKE 1 TABLET BY MOUTH EVERYDAY AT BEDTIME   citalopram (CELEXA) 20 MG tablet TAKE 1 TABLET BY MOUTH EVERY DAY   levothyroxine (SYNTHROID) 100 MCG tablet Take 1 tablet (100 mcg total) by mouth daily before breakfast. Mon- Sat 100 mcg daily, and Sun 150 mcg daily   Multiple Vitamins-Minerals (PRESERVISION AREDS PO) Take 1 capsule by mouth in  the morning and at bedtime.   omeprazole (PRILOSEC) 20 MG capsule TAKE 1 CAPSULE BY MOUTH EVERY DAY   oxybutynin (DITROPAN-XL) 10 MG 24 hr tablet TAKE 1 TABLET BY MOUTH AT  BEDTIME   Polyethyl Glycol-Propyl Glycol (SYSTANE OP) Place 1 drop into both eyes daily as needed (DRY EYES).   pregabalin (LYRICA) 150 MG capsule Take 1 capsule (150 mg total) by mouth 2 (two) times daily.   traMADol (ULTRAM) 50 MG tablet TAKE 1 TABLET BY MOUTH THREE TIMES A DAY AS NEEDED   vitamin B-12 (CYANOCOBALAMIN) 100 MCG tablet Take 100 mcg by mouth daily.    Allergies: Patient is allergic to aspirin, demerol  [meperidine hcl], penicillins, statins, cephalexin, clindamycin/lincomycin, and tape. Family History: Patient family history includes Brain cancer in her brother; Cancer in her father; Diabetes in her sister; Heart disease in her father, mother, and sister; Hypertension in her mother; Kidney cancer in her sister; Lung cancer in her sister. Social History:  Patient  reports that she has never smoked. She has never used smokeless tobacco. She reports that she does not drink alcohol and does not use drugs.  Review of Systems: Constitutional: Negative for fever malaise or anorexia Cardiovascular: negative for chest pain Respiratory: negative for SOB or persistent cough Gastrointestinal: negative for abdominal pain  Objective  Vitals: BP (!) 150/60   Pulse 68   Temp 98.2 F (36.8 C)   Ht 5' (1.524 m)   Wt 179 lb 6.4 oz (81.4 kg)   SpO2 97%   BMI 35.04 kg/m  General: no acute distress , A&Ox3 Psych: Normal mentation, normal speech  Commons side effects, risks, benefits, and alternatives for medications and treatment plan prescribed today were discussed, and the patient expressed understanding of the given instructions. Patient is instructed to call or message via MyChart if he/she has any questions or concerns regarding our treatment plan. No barriers to understanding were identified. We discussed [place] symptoms and signs in detail. Patient expressed understanding regarding what to do in case of urgent or emergency type symptoms.  Medication list was reconciled, printed and provided to the patient in AVS. Patient instructions and summary information was reviewed with the patient as documented in the AVS. This note was prepared with assistance of Dragon voice recognition software. Occasional wrong-word or sound-a-like substitutions may have occurred due to the inherent limitations of voice recognition software

## 2022-07-12 NOTE — Patient Instructions (Signed)
Please return in July as scheduled for recheck. Please cancel your appointment in April.  I have referred you to a pain management specialist.   Stop the lyrica.  You may restart the gabapentin if you are feeling back to your self in the next several days. Continue the tramadol as needed for neck pain.   If you have any questions or concerns, please don't hesitate to send me a message via MyChart or call the office at [phone]. Thank you for visiting with [place] today! It's our pleasure caring for you.

## 2022-07-13 ENCOUNTER — Other Ambulatory Visit: Payer: Self-pay | Admitting: Family Medicine

## 2022-07-13 DIAGNOSIS — N3281 Overactive bladder: Secondary | ICD-10-CM

## 2022-07-17 ENCOUNTER — Telehealth: Payer: Medicare Other | Admitting: Nurse Practitioner

## 2022-07-17 DIAGNOSIS — R5383 Other fatigue: Secondary | ICD-10-CM | POA: Diagnosis not present

## 2022-07-17 NOTE — Progress Notes (Signed)
Virtual Visit Consent   Martha Gilmore, you are scheduled for a virtual visit with a [place] [facility] provider today. Just as with appointments in the office, your consent must be obtained to participate. Your consent will be active for this visit and any virtual visit you may have with one of our providers in the next 365 days. If you have a MyChart account, a copy of this consent can be sent to you electronically.  As this is a virtual visit, video technology does not allow for your provider to perform a traditional examination. This may limit your provider's ability to fully assess your condition. If your provider identifies any concerns that need to be evaluated in person or the need to arrange testing (such as labs, EKG, etc.), we will make arrangements to do so. Although advances in technology are sophisticated, we cannot ensure that it will always work on either your end or our end. If the connection with a video visit is poor, the visit may have to be switched to a telephone visit. With either a video or telephone visit, we are not always able to ensure that we have a secure connection.  By engaging in this virtual visit, you consent to the provision of healthcare and authorize for your insurance to be billed (if applicable) for the services provided during this visit. Depending on your insurance coverage, you may receive a charge related to this service.  I need to obtain your verbal consent now. Are you willing to proceed with your visit today? Martha Gilmore has provided verbal consent on 07/17/2022 for a virtual visit (video or telephone). Apolonio Schneiders, FNP  Date: 07/17/2022 3:56 PM  Virtual Visit via Video Note   I, Apolonio Schneiders, connected with  Martha Gilmore  ([id number], [birth year]) on 07/17/22 at  4:00 PM EDT by a video-enabled telemedicine application and verified that I am speaking with the correct person using two identifiers.  Location: Patient: Virtual Visit Location Patient:  Home Provider: Virtual Visit Location Provider: Home Office   I discussed the limitations of evaluation and management by telemedicine and the availability of in person appointments. The patient expressed understanding and agreed to proceed.    History of Present Illness: Martha Gilmore is a 82 y.o. who identifies as a female who was assigned female at birth, and is being seen today for loss of appetite & fatigue.   07/14/22 she noted that her eyes were very itchy  07/15/22 she had increased fatigue slept on and off throughout that day  For the past 72 hours she has had a loss of appetite  She has changed medications frequently in the past year in an attempt to better manage her neuropathic pain.  History 06/28/22 changed to Lyrica and nortriptyline stopped Celexa at that time  She was on Lyrica for a brief 5 days from 07/07/22-07/12/22 during that time she also restarted her Celexa When she met with her PCP on 07/12/22 Lyrica was stopped and she was referred to a pain specialist  Has was also on gabapentin for a brief time and that has also been stopped    Home COVID test negative Denies other symptoms today Daughter is present to help provide history   Problems:  Patient Active Problem List   Diagnosis Date Noted   Chronic neck pain 12/14/2021   Arthropathy of cervical facet joint 12/14/2021   Spondylosis, cervical 12/14/2021   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 [facility]) 05/02/2021   Nonrheumatic aortic valve insufficiency 04/07/2021  Obstructive sleep apnea 03/28/2021   Aortic insufficiency 01/18/2021   Atrial flutter ([place]) 12/30/2020   Chondromalacia, knee, right 11/05/2017   Tricompartment osteoarthritis of right knee 11/05/2017   Major depression, recurrent, chronic ([place]) 04/16/2017   Mixed hyperlipidemia 04/16/2017   Clostridium difficile infection 04/16/2017   Family history of early CAD 04/16/2017   Bilateral sensorineural hearing loss  02/06/2016   Postablative hypothyroidism 12/20/2015   History of Graves' disease 03/22/2015   OAB (overactive bladder) 12/16/2013   GERD (gastroesophageal reflux disease) 12/13/2012   Osteoarthritis of CMC joint of thumb 08/13/2012   Seasonal allergies 08/13/2012   Urge and stress incontinence 08/13/2012   Osteopenia 08/05/2012   DDD (degenerative disc disease), lumbosacral 10/22/2010    Allergies:  Allergies  Allergen Reactions   Aspirin Itching   Demerol  [Meperidine Hcl] Other (See Comments)    hallucinations   Penicillins Other (See Comments) and Rash    Other Reaction: RASH & HIVES    Statins Itching   Cephalexin Other (See Comments) and Rash    Other Reaction: RASH & ITCHING    Clindamycin/Lincomycin Other (See Comments) and Rash    Other Reaction: RASH & ITCHING    Tape Rash    Paper tape is OK    Medications:  Current Outpatient Medications:    atorvastatin (LIPITOR) 10 MG tablet, TAKE 1 TABLET BY MOUTH EVERYDAY AT BEDTIME, Disp: 90 tablet, Rfl: 3   citalopram (CELEXA) 20 MG tablet, TAKE 1 TABLET BY MOUTH EVERY DAY, Disp: 90 tablet, Rfl: 3   levothyroxine (SYNTHROID) 100 MCG tablet, Take 1 tablet (100 mcg total) by mouth daily before breakfast. Mon- Sat 100 mcg daily, and Sun 150 mcg daily, Disp: , Rfl:    Multiple Vitamins-Minerals (PRESERVISION AREDS PO), Take 1 capsule by mouth in the morning and at bedtime., Disp: , Rfl:    omeprazole (PRILOSEC) 20 MG capsule, TAKE 1 CAPSULE BY MOUTH EVERY DAY, Disp: 90 capsule, Rfl: 3   oxybutynin (DITROPAN-XL) 10 MG 24 hr tablet, TAKE ONE TABLET BY MOUTH AT BEDTIME, Disp: 90 tablet, Rfl: 3   Polyethyl Glycol-Propyl Glycol (SYSTANE OP), Place 1 drop into both eyes daily as needed (DRY EYES)., Disp: , Rfl:    traMADol (ULTRAM) 50 MG tablet, TAKE 1 TABLET BY MOUTH THREE TIMES A DAY AS NEEDED, Disp: 60 tablet, Rfl: 5   vitamin B-12 (CYANOCOBALAMIN) 100 MCG tablet, Take 100 mcg by mouth daily., Disp: , Rfl:    Observations/Objective:  No labored breathing.  Speech is clear and coherent with logical content.  Patient is alert and oriented at baseline.    Assessment and Plan: 1. Other fatigue Discussed with patient medication management is best done through PCP No acute viral cause identified for loss of appetite or fatigue  Possibly combination of seasonal allergies with acute effects of SSRI withdrawal   May consider restarting Celexa while awaiting Pain specialist appointment if lack of appetite and fatigue persist  Encouraged to discuss with PCP     Follow Up Instructions: I discussed the assessment and treatment plan with the patient. The patient was provided an opportunity to ask questions and all were answered. The patient agreed with the plan and demonstrated an understanding of the instructions.  A copy of instructions were sent to the patient via MyChart unless otherwise noted below.    The patient was advised to call back or seek an in-person evaluation if the symptoms worsen or if the condition fails to improve as anticipated.  Time:  I  spent 15 minutes with the patient via telehealth technology discussing the above problems/concerns.    Apolonio Schneiders, FNP

## 2022-07-21 ENCOUNTER — Telehealth: Payer: Medicare Other | Admitting: Family Medicine

## 2022-07-21 DIAGNOSIS — T887XXA Unspecified adverse effect of drug or medicament, initial encounter: Secondary | ICD-10-CM | POA: Diagnosis not present

## 2022-07-21 DIAGNOSIS — F419 Anxiety disorder, unspecified: Secondary | ICD-10-CM

## 2022-07-21 NOTE — Patient Instructions (Signed)
Panic Attack A panic attack is a sudden episode of severe anxiety, fear, or discomfort that causes physical and emotional symptoms. A panic attack may be in response to something frightening, or it may occur for no known reason. Symptoms of a panic attack can be similar to symptoms of a heart attack or stroke. It is important to see your health care provider when you have a panic attack so that these conditions can be ruled out. What are the causes? A panic attack may be caused by: An extreme, life-threatening situation, such as a war or natural disaster. An anxiety disorder, such as post-traumatic stress disorder. Depression. Panic disorder. Certain medical conditions, including heart problems, neurological conditions, and infections. Other causes may include: Certain over-the-counter and prescription medicines. Supplements that increase anxiety. Illegal drugs that increase heart rate and blood pressure, such as methamphetamine. What increases the risk? You are more likely to develop this condition if: You have another mental health condition. You use alcohol, illegal drugs, or other substances. You are under extreme stress. A life event is causing increased feelings of anxiety and depression. What are the signs or symptoms? A panic attack starts suddenly, usually lasts 5-10 minutes, and occurs with one or more of the following: A pounding heart, or a feeling that your heart is beating irregularly or faster than normal (palpitations). Sweating, trembling, or shaking. Shortness of breath, feeling smothered, or feeling choked. Chest pain or discomfort. Nausea or a strange feeling in your stomach. Dizziness, feeling light-headed, or feeling like you might faint. Other symptoms may include: Chills or hot flashes. Numbness or tingling in your lips, hands, or feet. Feeling confused, or feeling that you are not yourself. Fear of losing control or of being emotionally unstable, or fear of  dying. How is this diagnosed? A panic attack is diagnosed with an assessment by your health care provider. During the assessment, your health care provider will ask questions about: Your history of anxiety, depression, and panic attacks. Your medical history. Whether you drink alcohol, use drugs, take supplements, or take medicines. Be honest about your substance use. Your health care provider may also: Order blood tests or other kinds of tests to rule out serious medical conditions. Refer you to a mental health professional for further evaluation. How is this treated? A panic attack is a symptom of another condition. Treatment depends on the cause of the panic attack. If the cause is a medical problem, your health care provider will treat that problem or refer you to a specialist. If the cause is emotional, you may be given anti-anxiety medicines or referred to a counselor. Anti-anxiety medicines may reduce how often attacks happen, reduce how severe the attacks are, and lower anxiety. If the cause is a medicine, your health care provider may tell you to stop the medicine, change your dose, or take a different medicine. If the cause is an illegal drug, treatment may involve letting the drug wear off and taking medicine to help the drug leave your body or to stop its effects. Attacks caused by heavy drug use may continue even if you stop using the drug. Most panic attacks go away with treatment of the underlying problem. If you have panic attacks often, you may have a condition called panic disorder. Follow these instructions at home: Alcohol use Do not drink alcohol if: Your health care provider tells you not to drink. You are pregnant, may be pregnant, or are planning to become pregnant. If you drink alcohol: Limit how much   you have to: 0-1 drink a day for women. 0-2 drinks a day for men. Know how much alcohol is in your drink. In the [place] one drink equals one 12 oz bottle of beer (355  mL), one 5 oz glass of wine (148 mL), or one 1 oz glass of hard liquor (44 mL). General instructions Take over-the-counter and prescription medicines only as told by your health care provider. If you feel anxious, limit your caffeine intake. Take good care of your physical and mental health by: Eating a balanced diet that includes plenty of fresh fruits and vegetables, whole grains, lean meats, and low-fat dairy. Getting plenty of rest. Try to get 7-8 hours of uninterrupted sleep each night. Exercising regularly. Try to get 30 minutes of physical activity at least 5 days a week. Do not use any products that contain nicotine or tobacco. These products include cigarettes, chewing tobacco, and vaping devices, such as e-cigarettes. If you need help quitting, ask your health care provider. Keep all follow-up visits. This is important. Panic attacks may have underlying physical or emotional problems that take time to accurately diagnose. Where to find more information Substance Abuse and [facility]): Sameday[employer].com.[facility]): https://carter.com/ Contact a health care provider if: Your symptoms do not improve, or they get worse. You are not able to take your medicine as prescribed because of side effects. Get help right away if: You have thoughts about hurting yourself or others. Get help right away if you feel like you may hurt yourself or others, or have thoughts about taking your own life. Go to your nearest emergency room or: Call 911. Call the [place] at 320-374-0981 or 988. This is open 24 hours a day. Text the Crisis Text Line at (778) 563-3994. Summary A panic attack is a sudden episode of severe anxiety, fear, or discomfort that causes physical and emotional symptoms. Always see a health care provider to have the reasons for the panic attack correctly diagnosed. If your panic attack was caused by a  physical problem, follow your health care provider's suggestions for medicine, referral to a specialist, and lifestyle changes. If your panic attack was caused by an emotional problem, follow through with counseling from a qualified mental health specialist. If you feel like you may hurt yourself or others, call 911 and get help right away. This information is not intended to replace advice given to you by your health care provider. Make sure you discuss any questions you have with your health care provider. Document Revised: 11/18/2020 Document Reviewed: 11/18/2020 Elsevier Patient Education  [place]. Generalized Anxiety Disorder, Adult Generalized anxiety disorder (GAD) is a mental health condition. Unlike normal worries, anxiety related to GAD is not triggered by a specific event. These worries do not fade or get better with time. GAD interferes with relationships, work, and school. GAD symptoms can vary from mild to severe. People with severe GAD can have intense waves of anxiety with physical symptoms that are similar to panic attacks. What are the causes? The exact cause of GAD is not known, but the following are believed to have an impact: Differences in natural brain chemicals. Genes passed down from parents to children. Differences in the way threats are perceived. Development and stress during childhood. Personality. What increases the risk? The following factors may make you more likely to develop this condition: Being female. Having a family history of anxiety disorders. Being very shy. Experiencing very stressful life  events, such as the death of a loved one. Having a very stressful family environment. What are the signs or symptoms? People with GAD often worry excessively about many things in their lives, such as their health and family. Symptoms may also include: Mental and emotional symptoms: Worrying excessively about natural disasters. Fear of being  late. Difficulty concentrating. Fears that others are judging your performance. Physical symptoms: Fatigue. Headaches, muscle tension, muscle twitches, trembling, or feeling shaky. Feeling like your heart is pounding or beating very fast. Feeling out of breath or like you cannot take a deep breath. Having trouble falling asleep or staying asleep, or experiencing restlessness. Sweating. Nausea, diarrhea, or irritable bowel syndrome (IBS). Behavioral symptoms: Experiencing erratic moods or irritability. Avoidance of new situations. Avoidance of people. Extreme difficulty making decisions. How is this diagnosed? This condition is diagnosed based on your symptoms and medical history. You will also have a physical exam. Your health care provider may perform tests to rule out other possible causes of your symptoms. To be diagnosed with GAD, a person must have anxiety that: Is out of his or her control. Affects several different aspects of his or her life, such as work and relationships. Causes distress that makes him or her unable to take part in normal activities. Includes at least three symptoms of GAD, such as restlessness, fatigue, trouble concentrating, irritability, muscle tension, or sleep problems. Before your health care provider can confirm a diagnosis of GAD, these symptoms must be present more days than they are not, and they must last for 6 months or longer. How is this treated? This condition may be treated with: Medicine. Antidepressant medicine is usually prescribed for long-term daily control. Anti-anxiety medicines may be added in severe cases, especially when panic attacks occur. Talk therapy (psychotherapy). Certain types of talk therapy can be helpful in treating GAD by providing support, education, and guidance. Options include: Cognitive behavioral therapy (CBT). People learn coping skills and self-calming techniques to ease their physical symptoms. They learn to identify  unrealistic thoughts and behaviors and to replace them with more appropriate thoughts and behaviors. Acceptance and commitment therapy (ACT). This treatment teaches people how to be mindful as a way to cope with unwanted thoughts and feelings. Biofeedback. This process trains you to manage your body's response (physiological response) through breathing techniques and relaxation methods. You will work with a therapist while machines are used to monitor your physical symptoms. Stress management techniques. These include yoga, meditation, and exercise. A mental health specialist can help determine which treatment is best for you. Some people see improvement with one type of therapy. However, other people require a combination of therapies. Follow these instructions at home: Lifestyle Maintain a consistent routine and schedule. Anticipate stressful situations. Create a plan and allow extra time to work with your plan. Practice stress management or self-calming techniques that you have learned from your therapist or your health care provider. Exercise regularly and spend time outdoors. Eat a healthy diet that includes plenty of vegetables, fruits, whole grains, low-fat dairy products, and lean protein. Do not eat a lot of foods that are high in fat, added sugar, or salt (sodium). Drink plenty of water. Avoid alcohol. Alcohol can increase anxiety. Avoid caffeine and certain over-the-counter cold medicines. These may make you feel worse. Ask your pharmacist which medicines to avoid. General instructions Take over-the-counter and prescription medicines only as told by your health care provider. Understand that you are likely to have setbacks. Accept this and be kind to  yourself as you persist to take better care of yourself. Anticipate stressful situations. Create a plan and allow extra time to work with your plan. Recognize and accept your accomplishments, even if you judge them as small. Spend time  with people who care about you. Keep all follow-up visits. This is important. Where to find more information [place]: https://carter.com/ Substance Abuse and Mental Health Services: ktimeonline.com Contact a health care provider if: Your symptoms do not get better. Your symptoms get worse. You have signs of depression, such as: A persistently sad or irritable mood. Loss of enjoyment in activities that used to bring you joy. Change in weight or eating. Changes in sleeping habits. Get help right away if: You have thoughts about hurting yourself or others. If you ever feel like you may hurt yourself or others, or have thoughts about taking your own life, get help right away. Go to your nearest emergency department or: Call your local emergency services (911 in the [place] Call a suicide crisis helpline, such as the [place] at 8727899190 or 988 in the [place]. This is open 24 hours a day in the [place] Text the Crisis Text Line at 989 256 5358 (in the [place].). Summary Generalized anxiety disorder (GAD) is a mental health condition that involves worry that is not triggered by a specific event. People with GAD often worry excessively about many things in their lives, such as their health and family. GAD may cause symptoms such as restlessness, trouble concentrating, sleep problems, frequent sweating, nausea, diarrhea, headaches, and trembling or muscle twitching. A mental health specialist can help determine which treatment is best for you. Some people see improvement with one type of therapy. However, other people require a combination of therapies. This information is not intended to replace advice given to you by your health care provider. Make sure you discuss any questions you have with your health care provider. Document Revised: 11/03/2020 Document Reviewed: 08/01/2020 Elsevier Patient Education  [place].

## 2022-07-21 NOTE — Progress Notes (Signed)
Virtual Visit Consent   Martha Gilmore, you are scheduled for a virtual visit with a [facility] provider today. Just as with appointments in the office, your consent must be obtained to participate. Your consent will be active for this visit and any virtual visit you may have with one of our providers in the next 365 days. If you have a MyChart account, a copy of this consent can be sent to you electronically.  As this is a virtual visit, video technology does not allow for your provider to perform a traditional examination. This may limit your provider's ability to fully assess your condition. If your provider identifies any concerns that need to be evaluated in person or the need to arrange testing (such as labs, EKG, etc.), we will make arrangements to do so. Although advances in technology are sophisticated, we cannot ensure that it will always work on either your end or our end. If the connection with a video visit is poor, the visit may have to be switched to a telephone visit. With either a video or telephone visit, we are not always able to ensure that we have a secure connection.  By engaging in this virtual visit, you consent to the provision of healthcare and authorize for your insurance to be billed (if applicable) for the services provided during this visit. Depending on your insurance coverage, you may receive a charge related to this service.  I need to obtain your verbal consent now. Are you willing to proceed with your visit today? Martha Gilmore has provided verbal consent on 07/21/2022 for a virtual visit (video or telephone). Dellia Nims, FNP  Date: 07/21/2022 4:28 PM  Virtual Visit via Video Note   I, Dellia Nims, connected with  Martha Gilmore  ([id number], [birth year]) on 07/21/22 at  4:00 PM EDT by a video-enabled telemedicine application and verified that I am speaking with the correct person using two identifiers.  Location: Patient: Virtual Visit Location Patient:  Home Provider: Virtual Visit Location Provider: Home Office   I discussed the limitations of evaluation and management by telemedicine and the availability of in person appointments. The patient expressed understanding and agreed to proceed.    History of Present Illness: Martha Gilmore is a 82 y.o. who identifies as a female who was assigned female at birth, and is being seen today for medication changed that have resulted in increased anxiety with a panic attack this am. She is lying in bed. Her daughter reports she has not been out of bed much in 2 weeks. On 3/12 her citalopram and gabapentin were stopped and lyrica and nortriptylline were started. She went back on the 20th and was started back on citalopram and the nortriptylline and lyrica were discontinued. She has not been started back on gabapentin. She has been nervous, nauseated and dizzy since then and had a panic attack this am. She called her daughter who lives near. They want to make sure this is normal and when it will improve. 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 [facility]) 05/02/2021   Nonrheumatic aortic valve insufficiency 04/07/2021   Obstructive sleep apnea 03/28/2021   Aortic insufficiency 01/18/2021   Atrial flutter ([place]) 12/30/2020   Chondromalacia, knee, right 11/05/2017   Tricompartment osteoarthritis of right knee 11/05/2017   Major depression, recurrent,  chronic ([place]) 04/16/2017   Mixed hyperlipidemia 04/16/2017   Clostridium difficile infection 04/16/2017   Family history of early CAD 04/16/2017   Bilateral sensorineural hearing loss 02/06/2016   Postablative hypothyroidism 12/20/2015   History of Graves' disease 03/22/2015   OAB (overactive bladder) 12/16/2013   GERD (gastroesophageal  reflux disease) 12/13/2012   Osteoarthritis of CMC joint of thumb 08/13/2012   Seasonal allergies 08/13/2012   Urge and stress incontinence 08/13/2012   Osteopenia 08/05/2012   DDD (degenerative disc disease), lumbosacral 10/22/2010    Allergies:  Allergies  Allergen Reactions   Aspirin Itching   Demerol  [Meperidine Hcl] Other (See Comments)    hallucinations   Penicillins Other (See Comments) and Rash    Other Reaction: RASH & HIVES    Statins Itching   Cephalexin Other (See Comments) and Rash    Other Reaction: RASH & ITCHING    Clindamycin/Lincomycin Other (See Comments) and Rash    Other Reaction: RASH & ITCHING    Tape Rash    Paper tape is OK    Medications:  Current Outpatient Medications:    atorvastatin (LIPITOR) 10 MG tablet, TAKE 1 TABLET BY MOUTH EVERYDAY AT BEDTIME, Disp: 90 tablet, Rfl: 3   citalopram (CELEXA) 20 MG tablet, TAKE 1 TABLET BY MOUTH EVERY DAY, Disp: 90 tablet, Rfl: 3   levothyroxine (SYNTHROID) 100 MCG tablet, Take 1 tablet (100 mcg total) by mouth daily before breakfast. Mon- Sat 100 mcg daily, and Sun 150 mcg daily, Disp: , Rfl:    Multiple Vitamins-Minerals (PRESERVISION AREDS PO), Take 1 capsule by mouth in the morning and at bedtime., Disp: , Rfl:    omeprazole (PRILOSEC) 20 MG capsule, TAKE 1 CAPSULE BY MOUTH EVERY DAY, Disp: 90 capsule, Rfl: 3   oxybutynin (DITROPAN-XL) 10 MG 24 hr tablet, TAKE ONE TABLET BY MOUTH AT BEDTIME, Disp: 90 tablet, Rfl: 3   Polyethyl Glycol-Propyl Glycol (SYSTANE OP), Place 1 drop into both eyes daily as needed (DRY EYES)., Disp: , Rfl:    traMADol (ULTRAM) 50 MG tablet, TAKE 1 TABLET BY MOUTH THREE TIMES A DAY AS NEEDED, Disp: 60 tablet, Rfl: 5   vitamin B-12 (CYANOCOBALAMIN) 100 MCG tablet, Take 100 mcg by mouth daily., Disp: , Rfl:   Observations/Objective: Patient is well-developed, well-nourished in no acute distress.  Resting comfortably  at home.  Head is normocephalic, atraumatic.  No labored breathing.   Speech is clear and coherent with logical content.  Patient is alert and oriented at baseline.    Assessment and Plan: 1. Anxiety  2. Medication side effects  Continue meds as they are now. Do not add gabapentin at this point. Walk, eat healthy diet, increase water, sit in the sun and sx should improve in the next week or two. No suicidal or homicidal thoughts but her daughter will monitor for it. Discussed the need to wean off ssri's and that all of these meds discussed can effect any pt mentally more so if you are elderly and do not metabolize them as quickly. ED if sx worsen.   Follow Up Instructions: I discussed the assessment and treatment plan with the patient. The patient was provided an opportunity to ask questions and all were answered. The patient agreed with the plan and demonstrated an understanding of the instructions.  A copy of instructions were sent to the patient via MyChart unless otherwise noted below.     The patient was advised to call back or seek an in-person evaluation if the symptoms worsen  or if the condition fails to improve as anticipated.  Time:  I spent 10 minutes with the patient via telehealth technology discussing the above problems/concerns.    Dellia Nims, FNP

## 2022-07-24 ENCOUNTER — Ambulatory Visit: Payer: Medicare Other | Admitting: Family Medicine

## 2022-07-25 ENCOUNTER — Telehealth: Payer: Medicare Other

## 2022-08-09 ENCOUNTER — Ambulatory Visit: Payer: Medicare Other | Admitting: Family Medicine

## 2022-08-14 ENCOUNTER — Telehealth: Payer: Self-pay | Admitting: *Deleted

## 2022-08-14 ENCOUNTER — Ambulatory Visit (INDEPENDENT_AMBULATORY_CARE_PROVIDER_SITE_OTHER): Payer: Medicare Other | Admitting: *Deleted

## 2022-08-14 DIAGNOSIS — E782 Mixed hyperlipidemia: Secondary | ICD-10-CM

## 2022-08-14 DIAGNOSIS — I483 Typical atrial flutter: Secondary | ICD-10-CM

## 2022-08-14 NOTE — Telephone Encounter (Signed)
   CCM RN Visit Note   08/14/22 Name: Ashantia [name] MRN: [id number]      DOB: 05-23-1940  Subjective: Martha Gilmore is a 82 y.o. year old female who is a primary care patient of Asencion Partridge MD. The patient was referred to the Chronic Care Management team for assistance with care management needs subsequent to provider initiation of CCM services and plan of care.      An unsuccessful telephone outreach was attempted today to contact the patient about Chronic Care Management needs.    Plan:Telephone follow up appointment with care management team member scheduled for:  upon care guide rescheduling.  Irving Shows RNC, BSN RN Case Physicist, medical Healthcare at [facility] [phone]

## 2022-08-14 NOTE — Chronic Care Management (AMB) (Signed)
Chronic Care Management   CCM RN Visit Note  08/14/2022 Name: Martha Gilmore MRN: [id number] DOB: [birth year]  Subjective: Martha Gilmore is a 82 y.o. year old female who is a primary care patient of Willow Ora, MD. The patient was referred to the Chronic Care Management team for assistance with care management needs subsequent to provider initiation of CCM services and plan of care.    Today's Visit:  Engaged with patient by telephone for follow up visit.        Goals Addressed             This Visit's Progress    COMPLETED: CCM (ATRIAL FLUTTER) EXPECTED OUTCOME: MONITOR, SELF-MANAGE AND REDUCE SYMPTOMS OF ATRIAL FLUTTER       Current Barriers:  Knowledge Deficits related to Atrial Flutter management Chronic Disease Management support and education needs related to Atrial Flutter, action plan Patient reports she had ablation in the past and has been asymptomatic  No new concerns reported today Patient reports she will be following up with another practice for primary care, requests discharge from CCM program  Planned Interventions: Advised patient to discuss any issues/ exacerbation of Atrial Flutter with provider Counseled on importance of regular laboratory monitoring as prescribed Afib action plan reviewed Reviewed importance of taking medications as prescribed Reviewed importance of exercise Reviewed plan of care and case closure today  Symptom Management: Take medications as prescribed   Attend all scheduled provider appointments Call pharmacy for medication refills 3-7 days in advance of running out of medications Attend church or other social activities Perform all self care activities independently  Perform IADL's (shopping, preparing meals, housekeeping, managing finances) independently Call provider office for new concerns or questions  Try to do some type of exercise daily- walking is good    Follow Up Plan: No further follow up required: Case closure  today per pt request and pt will be seeing new primary care provider at another practice         COMPLETED: CCM (HYPERLIPIDEMIA) EXPECTED OUTCOME: MONITOR, SELF-MANAGE AND REDUCE SYMPTOMS OF HYPERLIPIDEMIA       Current Barriers:  Knowledge Deficits related to Hyperlipidemia management Chronic Disease Management support and education needs related to Hyperlipidemia, diet Patient reports she lives alone, has adult daughter that lives nearby that she can call of if needed Patient reports she is independent in all aspects of her care, continues to drive Patient reports she has all medications and taking as prescribed  Planned Interventions: Provider established cholesterol goals reviewed; Counseled on importance of regular laboratory monitoring as prescribed; Provided HLD educational materials; Reviewed importance of limiting foods high in cholesterol; Reviewed exercise goals and target of 150 minutes per week;  Symptom Management: Take medications as prescribed   Attend all scheduled provider appointments Call pharmacy for medication refills 3-7 days in advance of running out of medications Attend church or other social activities Perform all self care activities independently  Perform IADL's (shopping, preparing meals, housekeeping, managing finances) independently Call provider office for new concerns or questions  - call for medicine refill 2 or 3 days before it runs out - take all medications exactly as prescribed - call doctor with any symptoms you believe are related to your medicine - call doctor when you experience any new symptoms - go to all doctor appointments as scheduled - adhere to prescribed diet: heart healthy - develop an exercise routine Follow heart healthy diet  Follow Up Plan: No further follow up required: case closure today per  pt request             Plan:No further follow up required: Case closure  Irving Shows [facility], BSN RN Case Physicist, medical  Healthcare at [facility] [phone]

## 2022-08-14 NOTE — Patient Instructions (Signed)
Please call the care guide team at [phone] if you need to cancel or reschedule your appointment.   If you are experiencing a Mental Health or Behavioral Health Crisis or need someone to talk to, please call the Suicide and Crisis Lifeline: 988 call the [place] National Suicide Prevention Lifeline: 541-391-4471 or TTY: (954) 407-1805 TTY 4082155492) to talk to a trained counselor call 1-800-273-TALK (toll free, 24 hour hotline) go to [facility] [address], [place] [phone]) call 911   Following is a copy of the CCM Program Consent:  CCM service includes personalized support from designated clinical staff supervised by the physician, including individualized plan of care and coordination with other care providers 24/7 contact phone numbers for assistance for urgent and routine care needs. Service will only be billed when office clinical staff spend 20 minutes or more in a month to coordinate care. Only one practitioner may furnish and bill the service in a calendar month. The patient may stop CCM services at amy time (effective at the end of the month) by phone call to the office staff. The patient will be responsible for cost sharing (co-pay) or up to 20% of the service fee (after annual deductible is met)  Following is a copy of your full provider care plan:   Goals Addressed             This Visit's Progress    COMPLETED: CCM (ATRIAL FLUTTER) EXPECTED OUTCOME: MONITOR, SELF-MANAGE AND REDUCE SYMPTOMS OF ATRIAL FLUTTER       Current Barriers:  Knowledge Deficits related to Atrial Flutter management Chronic Disease Management support and education needs related to Atrial Flutter, action plan Patient reports she had ablation in the past and has been asymptomatic  No new concerns reported today Patient reports she will be following up with another practice for primary care, requests discharge from CCM program  Planned  Interventions: Advised patient to discuss any issues/ exacerbation of Atrial Flutter with provider Counseled on importance of regular laboratory monitoring as prescribed Afib action plan reviewed Reviewed importance of taking medications as prescribed Reviewed importance of exercise Reviewed plan of care and case closure today  Symptom Management: Take medications as prescribed   Attend all scheduled provider appointments Call pharmacy for medication refills 3-7 days in advance of running out of medications Attend church or other social activities Perform all self care activities independently  Perform IADL's (shopping, preparing meals, housekeeping, managing finances) independently Call provider office for new concerns or questions  Try to do some type of exercise daily- walking is good    Follow Up Plan: No further follow up required: Case closure today per pt request and pt will be seeing new primary care provider at another practice         COMPLETED: CCM (HYPERLIPIDEMIA) EXPECTED OUTCOME: MONITOR, SELF-MANAGE AND REDUCE SYMPTOMS OF HYPERLIPIDEMIA       Current Barriers:  Knowledge Deficits related to Hyperlipidemia management Chronic Disease Management support and education needs related to Hyperlipidemia, diet Patient reports she lives alone, has adult daughter that lives nearby that she can call of if needed Patient reports she is independent in all aspects of her care, continues to drive Patient reports she has all medications and taking as prescribed  Planned Interventions: Provider established cholesterol goals reviewed; Counseled on importance of regular laboratory monitoring as prescribed; Provided HLD educational materials; Reviewed importance of limiting foods high in cholesterol; Reviewed exercise goals and target of 150 minutes per week;  Symptom Management:  Take medications as prescribed   Attend all scheduled provider appointments Call pharmacy for medication  refills 3-7 days in advance of running out of medications Attend church or other social activities Perform all self care activities independently  Perform IADL's (shopping, preparing meals, housekeeping, managing finances) independently Call provider office for new concerns or questions  - call for medicine refill 2 or 3 days before it runs out - take all medications exactly as prescribed - call doctor with any symptoms you believe are related to your medicine - call doctor when you experience any new symptoms - go to all doctor appointments as scheduled - adhere to prescribed diet: heart healthy - develop an exercise routine Follow heart healthy diet  Follow Up Plan: No further follow up required: case closure today per pt request             Patient verbalizes understanding of instructions and care plan provided today and agrees to view in MyChart. Active MyChart status and patient understanding of how to access instructions and care plan via MyChart confirmed with patient.  No further follow up required: Case closure today per pt request

## 2022-08-22 DIAGNOSIS — I4891 Unspecified atrial fibrillation: Secondary | ICD-10-CM

## 2022-08-22 DIAGNOSIS — E785 Hyperlipidemia, unspecified: Secondary | ICD-10-CM | POA: Diagnosis not present

## 2022-08-28 ENCOUNTER — Telehealth: Payer: Medicare Other

## 2022-09-19 ENCOUNTER — Ambulatory Visit: Payer: Medicare Other | Admitting: Family Medicine

## 2022-09-19 ENCOUNTER — Other Ambulatory Visit: Payer: Self-pay

## 2022-09-19 VITALS — BP 182/86 | HR 68 | Ht 60.0 in | Wt 179.0 lb

## 2022-09-19 DIAGNOSIS — G8929 Other chronic pain: Secondary | ICD-10-CM | POA: Diagnosis not present

## 2022-09-19 DIAGNOSIS — M65342 Trigger finger, left ring finger: Secondary | ICD-10-CM

## 2022-09-19 DIAGNOSIS — M25561 Pain in right knee: Secondary | ICD-10-CM | POA: Diagnosis not present

## 2022-09-19 NOTE — Patient Instructions (Addendum)
Thank you for coming in today.   You received an injection today. Seek immediate medical attention if the joint becomes red, extremely painful, or is oozing fluid.   Try using a Double Band-aid Splint on your finger

## 2022-09-19 NOTE — Progress Notes (Signed)
Martha Payor, PhD, LAT, ATC acting as a scribe for Martha Graham, MD.  Martha Gilmore is a 82 y.o. female who presents to [facility] at [facility] today for exacerbation of her R knee pain. Pt was last seen by Dr. Denyse Amass on 04/05/22 and was given a R knee steroid injection. Today, pt reports R knee pain returning about 1 month ago. She c/o pain and difficulty going up the stairs.   She also c/o pain in her L 4th fingers x 2-3 wks. She c/o triggering, esp first thing in the mornings. Pt locates pain to L 4th MCP and IP joints. She's L-hand dominate.   Grip strength: difficulty Aggravates: any grabbing motions, opening jars Treatments tried: acupuncture and self-guided cognitive physical therapy.  She previously has been seen for chronic pain especially in her cervical spine has been difficult to manage.  I recommended a self-guided cognitive physical therapy course which has been very effective.  Dx imaging: 04/05/22 R knee XR  Pertinent review of systems: No fevers or chills  Relevant historical information: History of right hand trigger finger improved following surgery several years ago.   Exam:  BP (!) 182/86   Pulse 68   Ht 5' (1.524 m)   Wt 179 lb (81.2 kg)   SpO2 97%   BMI 34.96 kg/m  General: Well Developed, well nourished, and in no acute distress.   MSK: Left hand: Normal-appearing Tender palpation palmar fourth MCP.  Normal finger motion with some triggering present with flexion of PIP joint.  Intact strength.  Right knee mild effusion otherwise normal. Normal motion with crepitation.    Lab and Radiology Results   Procedure: Real-time Ultrasound Guided Injection of right knee joint superior lateral patellar space Device: Philips Affiniti 50G Images permanently stored and available for review in PACS Verbal informed consent obtained.  Discussed risks and benefits of procedure. Warned about infection, bleeding, hyperglycemia damage to  structures among others. Patient expresses understanding and agreement Time-out conducted.   Noted no overlying erythema, induration, or other signs of local infection.   Skin prepped in a sterile fashion.   Local anesthesia: Topical Ethyl chloride.   With sterile technique and under real time ultrasound guidance: 40 mg of Kenalog and 2 mL of Marcaine injected into knee joint. Fluid seen entering the joint capsule.   Completed without difficulty   Pain immediately resolved suggesting accurate placement of the medication.   Advised to call if fevers/chills, erythema, induration, drainage, or persistent bleeding.   Images permanently stored and available for review in the ultrasound unit.  Impression: Technically successful ultrasound guided injection.    Procedure: Real-time Ultrasound Guided Injection of left hand fourth MCP A1 pulley tendon sheath (trigger finger injection) Device: Philips Affiniti 50G Images permanently stored and available for review in PACS Verbal informed consent obtained.  Discussed risks and benefits of procedure. Warned about infection, bleeding, hyperglycemia damage to structures among others. Patient expresses understanding and agreement Time-out conducted.   Noted no overlying erythema, induration, or other signs of local infection.   Skin prepped in a sterile fashion.   Local anesthesia: Topical Ethyl chloride.   With sterile technique and under real time ultrasound guidance: 40 mg of Kenalog and 1 mL of lidocaine injected into flexor tendon tendon sheath at fourth MCP A1 pulley. Fluid seen entering the tendon sheath.   Completed without difficulty   Pain immediately resolved suggesting accurate placement of the medication.   Advised to call if fevers/chills, erythema,  induration, drainage, or persistent bleeding.   Images permanently stored and available for review in the ultrasound unit.  Impression: Technically successful ultrasound guided  injection.        Assessment and Plan: 82 y.o. female with right knee pain due to exacerbation of DJD.  Plan to repeat steroid injection today.  We can do this every 3 months.  Check back as needed.  Left hand fourth digit trigger finger.  Plan for injection today at A1 pulley.  Recheck back as needed.  If this does not work would recommend [facility].  She has been there previously for trigger finger surgery.   PDMP not reviewed this encounter. Orders Placed This Encounter  Procedures   [place] LIMITED JOINT SPACE STRUCTURES LOW RIGHT(NO LINKED CHARGES)    Order Specific Question:   Reason for Exam (SYMPTOM  OR DIAGNOSIS REQUIRED)    Answer:   right knee pain    Order Specific Question:   Preferred imaging location?    Answer:   [facility]   No orders of the defined types were placed in this encounter.    Discussed warning signs or symptoms. Please see discharge instructions. Patient expresses understanding.   The above documentation has been reviewed and is accurate and complete Martha Gilmore, M.D.

## 2022-09-25 ENCOUNTER — Encounter: Payer: Self-pay | Admitting: Nurse Practitioner

## 2022-09-25 ENCOUNTER — Ambulatory Visit: Payer: Medicare Other | Admitting: Nurse Practitioner

## 2022-09-25 VITALS — BP 150/84 | HR 71 | Ht 61.0 in | Wt 175.8 lb

## 2022-09-25 DIAGNOSIS — Z79899 Other long term (current) drug therapy: Secondary | ICD-10-CM

## 2022-09-25 DIAGNOSIS — I10 Essential (primary) hypertension: Secondary | ICD-10-CM

## 2022-09-25 DIAGNOSIS — R5383 Other fatigue: Secondary | ICD-10-CM

## 2022-09-25 DIAGNOSIS — F339 Major depressive disorder, recurrent, unspecified: Secondary | ICD-10-CM

## 2022-09-25 DIAGNOSIS — R0609 Other forms of dyspnea: Secondary | ICD-10-CM

## 2022-09-25 DIAGNOSIS — M1990 Unspecified osteoarthritis, unspecified site: Secondary | ICD-10-CM

## 2022-09-25 DIAGNOSIS — M8580 Other specified disorders of bone density and structure, unspecified site: Secondary | ICD-10-CM

## 2022-09-25 DIAGNOSIS — E890 Postprocedural hypothyroidism: Secondary | ICD-10-CM

## 2022-09-25 DIAGNOSIS — I483 Typical atrial flutter: Secondary | ICD-10-CM

## 2022-09-25 DIAGNOSIS — R202 Paresthesia of skin: Secondary | ICD-10-CM

## 2022-09-25 DIAGNOSIS — G4733 Obstructive sleep apnea (adult) (pediatric): Secondary | ICD-10-CM

## 2022-09-25 DIAGNOSIS — R2689 Other abnormalities of gait and mobility: Secondary | ICD-10-CM

## 2022-09-25 DIAGNOSIS — K219 Gastro-esophageal reflux disease without esophagitis: Secondary | ICD-10-CM

## 2022-09-25 HISTORY — DX: Other fatigue: R53.83

## 2022-09-25 LAB — CBC WITH DIFFERENTIAL/PLATELET
Basophils Absolute: 0.1 10*3/uL (ref 0.0–0.2)
Hematocrit: 43.9 % (ref 34.0–46.6)
Hemoglobin: 14.3 g/dL (ref 11.1–15.9)
Lymphs: 22 %
MCH: 29.2 pg (ref 26.6–33.0)
Monocytes Absolute: 0.9 10*3/uL (ref 0.1–0.9)
Monocytes: 8 %
Neutrophils Absolute: 7.8 10*3/uL — ABNORMAL HIGH (ref 1.4–7.0)
RBC: 4.9 x10E6/uL (ref 3.77–5.28)
WBC: 11.4 10*3/uL — ABNORMAL HIGH (ref 3.4–10.8)

## 2022-09-25 LAB — COMPREHENSIVE METABOLIC PANEL

## 2022-09-25 LAB — HEMOGLOBIN A1C

## 2022-09-25 LAB — VITAMIN D 25 HYDROXY (VIT D DEFICIENCY, FRACTURES)

## 2022-09-25 NOTE — Patient Instructions (Signed)
160 or less on your blood pressure machine is what I would like to have for you.

## 2022-09-25 NOTE — Progress Notes (Signed)
Martha Eth, DNP, AGNP-c [facility] [address] [place], [place] [address] Main Office [phone]   New patient visit   Patient: Martha Gilmore   DOB: Aug 27, 1940   82 y.o. Female  MRN: [id number] Visit Date: 09/25/2022  Patient Care Team: Malai Lady, Sung Amabile, NP as PCP - General (Nurse Practitioner) Regan Lemming, MD as PCP - Electrophysiology (Cardiology) Warden Fillers, MD as Consulting Physician (Endocrinology) Jeani Hawking, MD as Consulting Physician (Gastroenterology) Dominica Severin, MD as Consulting Physician (Orthopedic Surgery) Venancio Poisson, MD as Consulting Physician (Dermatology) Rodrigo Ran, OD as Consulting Physician (Ophthalmology) [facility] as Consulting Physician (Audiology) Dahlia Byes, [facility] (Inactive) as Pharmacist (Pharmacist) Renaldo Fiddler, MD as Consulting Physician (Pain Medicine)  Today's Vitals   09/25/22 1443  BP: (!) 150/84  Pulse: 71  Weight: 175 lb 12.8 oz (79.7 kg)  Height: 5\' 1"  (1.549 m)   Body mass index is 33.22 kg/m.   Today's healthcare provider: Tollie Eth, NP   Chief Complaint  Patient presents with   other    New pt. Est. Gets tired really easily    Subjective    Martha Gilmore is a 82 y.o. female who presents today as a new patient to establish care.   Martha Gilmore presents today with several health concerns.  Neurological She reports experiencing numbness and tingling in the neck, diagnosed by Dr. Denyse Amass as nerve-related. Martha Gilmore has been seeking alternative treatments like acupuncture and massage for management, which were paid for using a special allowance last year as they are not covered by her insurance.  Fatigue Martha Gilmore also expresses significant fatigue, which she initially thought might be related to her thyroid condition, but her thyroid levels are currently stable.   Medication Concerns She is concerned about the potential effects of long-term omeprazole use on  vitamin B12 levels and bone health.  Other Symptoms Martha Gilmore mentions occasional dizziness and instability, which she manages with the use of a cane. She has a history of heart issues including atrial flutter treated with ablation.  Support Martha Gilmore's daughter, Martha Gilmore, accompanies her and provides support, indicating Martha Gilmore's recent struggles with health management.  Medical History She has a history of thyroid replacement, atrial flutter treated with ablation, and uses a CPAP machine for sleep apnea, which she has discontinued due to discomfort. Martha Gilmore has also undergone a hysterectomy.  OSA Martha Gilmore has previous been seen by Dr. Mayford Knife for a  sleep study. In the future, if further evaluation is needed she requests to be seen by a different provider.   History reviewed and reveals the following: Past Medical History:  Diagnosis Date   Allergy    Aortic insufficiency    mild to moderate by echo 12/2020   Arthritis    Atrial flutter (HCC) 12/30/2020   Cataracts, bilateral 09/23/2015   Clostridium difficile infection 04/16/2017   H/o C.diff due to clindamicyin 09/2016; two rounds of treatment for cure. Dr. Elnoria Howard   Depression    GERD (gastroesophageal reflux disease)    Heart murmur    Hyperlipidemia    OAB (overactive bladder) 12/16/2013   Osteopenia 08/05/2012   Dexa scan 07/2012 mild osteopenia, T = -1.1   Seasonal allergies    Sleep apnea    Thyroid disease    Urge and stress incontinence    Past Surgical History:  Procedure Laterality Date   A-FLUTTER ABLATION N/A 10/14/2021   Procedure: A-FLUTTER ABLATION;  Surgeon: Regan Lemming, MD;  [facility]  INVASIVE CV LAB;  Service: Cardiovascular;  Laterality: N/A;   APPENDECTOMY  1971   CARDIOVERSION N/A 04/13/2021   Procedure: CARDIOVERSION;  Surgeon: Christell Constant, MD;  Location: [facility] ENDOSCOPY;  Service: Cardiovascular;  Laterality: N/A;   CESAREAN SECTION     CHOLECYSTECTOMY     EYE SURGERY  cataracts   Teeth Implants      TRIGGER FINGER RELEASE     VAGINAL HYSTERECTOMY     Family Status  Relation Name Status   Mother Isaiah Serge Deceased   Father Boykin Peek Deceased   Sister Jeanmarie Plant Deceased   Sister  Deceased   Brother  Deceased   MGM don't know Deceased   MGF  Deceased   PGM  Deceased   PGF  Deceased   Daughter Arletha Grippe. Andria Frames   Daughter  Alive   Sister Francesco Sor (Not Specified)   Family History  Problem Relation Age of Onset   Heart disease Mother    Hypertension Mother    Arthritis Mother    Miscarriages / [place] Mother    Obesity Mother    Cancer Father    Heart disease Father    Obesity Father    Lung cancer Sister    Heart disease Sister    Diabetes Sister    Cancer Sister    Hanny Elsberry death Sister    Obesity Sister    Kidney cancer Sister    Brain cancer Brother    Heart disease Maternal Grandmother    Cancer Daughter    Arthritis Sister    Cancer Sister    Social History   Socioeconomic History   Marital status: Widowed    Spouse name: Not on file   Number of children: Not on file   Years of education: Not on file   Highest education level: Some college, no degree  Occupational History   Not on file  Tobacco Use   Smoking status: Passive Smoke Exposure - Never Smoker   Smokeless tobacco: Never   Tobacco comments:    grew up in house of smokers  Vaping Use   Vaping Use: Never used  Substance and Sexual Activity   Alcohol use: No   Drug use: No   Sexual activity: Not Currently    Partners: Male    Birth control/protection: None  Other Topics Concern   Not on file  Social History Narrative   Not on file   Social Determinants of Health   Financial Resource Strain: Low Risk  (07/20/2022)   Overall Financial Resource Strain (CARDIA)    Difficulty of Paying Living Expenses: Not hard at all  Food Insecurity: No Food Insecurity (07/20/2022)   Hunger Vital Sign    Worried About Running Out of Food in the Last Year: Never true    Ran Out  of Food in the Last Year: Never true  Transportation Needs: No Transportation Needs (07/20/2022)   PRAPARE - Administrator, Civil Service (Medical): No    Lack of Transportation (Non-Medical): No  Physical Activity: Inactive (07/20/2022)   Exercise Vital Sign    Days of Exercise per Week: 0 days    Minutes of Exercise per Session: 0 min  Stress: No Stress Concern Present (07/20/2022)   [employer] of Occupational Health - Occupational Stress Questionnaire    Feeling of Stress : Not at all  Social Connections: Moderately Integrated (07/20/2022)   Social Connection and Isolation Panel [NHANES]    Frequency of Communication with  Friends and Family: More than three times a week    Frequency of Social Gatherings with Friends and Family: More than three times a week    Attends Religious Services: More than 4 times per year    Active Member of [employer] or Organizations: Yes    Attends Banker Meetings: More than 4 times per year    Marital Status: Widowed   Outpatient Medications Prior to Visit  Medication Sig Note   acetaminophen (TYLENOL) 500 MG tablet Take 500 mg by mouth every 6 (six) hours as needed.    atorvastatin (LIPITOR) 10 MG tablet TAKE 1 TABLET BY MOUTH EVERYDAY AT BEDTIME    citalopram (CELEXA) 20 MG tablet TAKE 1 TABLET BY MOUTH EVERY DAY    levothyroxine (SYNTHROID) 100 MCG tablet Take 1 tablet (100 mcg total) by mouth daily before breakfast. Mon- Sat 100 mcg daily, and Sun 150 mcg daily (Patient taking differently: Take 100 mcg by mouth daily before breakfast. 1 daily) 09/25/2022: Pt. Now only takes one daily,    Multiple Vitamins-Minerals (PRESERVISION AREDS PO) Take 1 capsule by mouth in the morning and at bedtime.    omeprazole (PRILOSEC) 20 MG capsule TAKE 1 CAPSULE BY MOUTH EVERY DAY    oxybutynin (DITROPAN-XL) 10 MG 24 hr tablet TAKE ONE TABLET BY MOUTH AT BEDTIME    Polyethyl Glycol-Propyl Glycol (SYSTANE OP) Place 1 drop into both eyes daily as  needed (DRY EYES).    No facility-administered medications prior to visit.   Allergies  Allergen Reactions   Aspirin Itching   Demerol  [Meperidine Hcl] Other (See Comments)    hallucinations   Penicillins Other (See Comments) and Rash    Other Reaction: RASH & HIVES    Statins Itching   Cephalexin Other (See Comments) and Rash    Other Reaction: RASH & ITCHING    Clindamycin/Lincomycin Other (See Comments) and Rash    Other Reaction: RASH & ITCHING    Diclofenac Other (See Comments)   Tape Rash    Paper tape is OK    Immunization History  Administered Date(s) Administered   Fluad Quad(high Dose 65+) 01/13/2021, 12/28/2021   Influenza Split 01/15/2012   Influenza, High Dose Seasonal PF 01/30/2013, 01/05/2015, 02/17/2016, 02/02/2017, 01/18/2018, 12/11/2018, 12/23/2019, 01/13/2021   Influenza, Seasonal, Injecte, Preservative Fre 01/21/2014   Influenza,inj,Quad PF,6+ Mos 01/21/2014   Influenza-Unspecified 01/09/2011, 01/15/2012, 01/21/2014, 01/22/2018   PFIZER(Purple Top)SARS-COV-2 Vaccination 05/15/2019, 06/05/2019, 01/20/2020, 08/16/2020   Pfizer Covid-19 Vaccine Bivalent Booster 79yrs & up 01/14/2021   Pneumococcal Conjugate-13 01/05/2015   Pneumococcal Polysaccharide-23 04/24/2006, 11/18/2007, 05/30/2012   Respiratory Syncytial Virus Vaccine,Recomb Aduvanted(Arexvy) 03/28/2022   Tdap 08/17/2010, 05/29/2022   Zoster Recombinant(Shingrix) 06/29/2018, 11/01/2018   Zoster, Live 04/25/1995    Health Maintenance Due Health Maintenance Topics with due status: Overdue     Topic Date Due   COVID-19 Vaccine 12/23/2021    Review of Systems All review of systems negative except what is listed in the HPI   Objective    BP (!) 150/84   Pulse 71   Ht 5\' 1"  (1.549 m)   Wt 175 lb 12.8 oz (79.7 kg)   BMI 33.22 kg/m  Physical Exam   - General: Patient experiences fatigue, not attributed to thyroid dysfunction as per recent normal thyroid levels.   - Neurological: Patient  reports numbness and tingling sensation in the neck area, diagnosed as nerve-related by a sports medicine specialist. No new neurological deficits noted during the examination.   - Cardiovascular: Presence  of a longstanding slight heart murmur, no new changes noted. History of atrial flutter treated with ablation in June 2023.   - Musculoskeletal: Patient has arthritis in the neck and uses a cane for stability. No exercise reported.   - Dermatological: N/A   - ENT: No fluid in ears; normal oral examination.  - Diagnostic Test Results and Labs:   - January 2023: Hemoglobin levels were normal, no anemia detected.   - June 2023: Patient underwent cardiac ablation for atrial flutter.   - Bone density: Results described as good, specific metrics N/A.   - Autoimmune and inflammation markers (2022): No concerning findings reported.   - B12 levels: Pending current evaluation.   - Blood pressure trends: Noted increase over time, with previous measurements around 132, and readings as high as 172 during medical procedures. Recent home measurements discussed, with adjustments for potential device error mentioned.   - Circulation assessment in legs: Reduced strength in right leg compared to left, specific metrics N/A.  Results for orders placed or performed in visit on 09/25/22  Hemoglobin A1c  Result Value Ref Range   Hgb A1c MFr Bld 5.7 (H) 4.8 - 5.6 %   Est. average glucose Bld gHb Est-mCnc 117 mg/dL  Vitamin W09  Result Value Ref Range   Vitamin B-12 675 232 - 1,245 pg/mL  CBC with Differential/Platelet  Result Value Ref Range   WBC 11.4 (H) 3.4 - 10.8 x10E3/uL   RBC 4.90 3.77 - 5.28 x10E6/uL   Hemoglobin 14.3 11.1 - 15.9 g/dL   Hematocrit 81.1 91.4 - 46.6 %   MCV 90 79 - 97 fL   MCH 29.2 26.6 - 33.0 pg   MCHC 32.6 31.5 - 35.7 g/dL   RDW 78.2 95.6 - 21.3 %   Platelets 368 150 - 450 x10E3/uL   Neutrophils 68 Not Estab. %   Lymphs 22 Not Estab. %   Monocytes 8 Not Estab. %   Eos 1 Not  Estab. %   Basos 1 Not Estab. %   Neutrophils Absolute 7.8 (H) 1.4 - 7.0 x10E3/uL   Lymphocytes Absolute 2.5 0.7 - 3.1 x10E3/uL   Monocytes Absolute 0.9 0.1 - 0.9 x10E3/uL   EOS (ABSOLUTE) 0.1 0.0 - 0.4 x10E3/uL   Basophils Absolute 0.1 0.0 - 0.2 x10E3/uL   Immature Granulocytes 0 Not Estab. %   Immature Grans (Abs) 0.0 0.0 - 0.1 x10E3/uL  Comprehensive metabolic panel  Result Value Ref Range   Glucose 98 70 - 99 mg/dL   BUN 21 8 - 27 mg/dL   Creatinine, Ser 0.86 0.57 - 1.00 mg/dL   eGFR 68 >57 QI/ONG/2.95   BUN/Creatinine Ratio 24 12 - 28   Sodium 138 134 - 144 mmol/L   Potassium 5.1 3.5 - 5.2 mmol/L   Chloride 102 96 - 106 mmol/L   CO2 20 20 - 29 mmol/L   Calcium 9.6 8.7 - 10.3 mg/dL   Total Protein 6.8 6.0 - 8.5 g/dL   Albumin 4.4 3.7 - 4.7 g/dL   Globulin, Total 2.4 1.5 - 4.5 g/dL   Albumin/Globulin Ratio 1.8 1.2 - 2.2   Bilirubin Total 0.4 0.0 - 1.2 mg/dL   Alkaline Phosphatase 133 (H) 44 - 121 IU/L   AST 18 0 - 40 IU/L   ALT 22 0 - 32 IU/L  Iron, TIBC and Ferritin Panel  Result Value Ref Range   Total Iron Binding Capacity 282 250 - 450 ug/dL   UIBC 284 132 - 440 ug/dL  Iron 56 27 - 139 ug/dL   Iron Saturation 20 15 - 55 %   Ferritin 160 (H) 15 - 150 ng/mL  VITAMIN D 25 Hydroxy (Vit-D Deficiency, Fractures)  Result Value Ref Range   Vit D, 25-Hydroxy 9.3 (L) 30.0 - 100.0 ng/mL    Assessment & Plan      Problem List Items Addressed This Visit     GERD (gastroesophageal reflux disease)    The patient has gastroesophageal reflux disease (GERD). Discussion with the risks vs. benefits of the medication. Previous trials off of the medication have been unsuccessful in the past.  Plan: - Continue omeprazole as the benefits outweigh the risks at this time. - Monitor for any changes in symptoms or side effects.      Major depression, recurrent, chronic (HCC)    The patient has anxiety and depression currently managed with citalopram. Mood is stable at this time with  no alarm symptoms.  Plan: - Continue Citalopram as the patient reports it as their go-to medication. - Monitor for any changes in mood or anxiety levels.      Atrial flutter (HCC)    The patient has a history of atrial flutter and ablation. HRRR on examination today with no alarm symptoms present. Ablation in 2023. Plan: - Continue monitoring for any recurrence of symptoms or palpitations. - Follow up with a cardiologist as needed.      Obstructive sleep apnea    The patient has sleep apnea and is currently not using CPAP. We discussed alternative options to CPAP if this is not tolerated. Consider repeat evaluation to determine if other therapies may be helpful.  Plan: - Reevaluate the need for sleep apnea treatment and explore alternative options if necessary. - Coordinate with a sleep specialist for further evaluation and management. We will plan for new provider.       Hypertension    Blood pressure is elevated in the office today. No alarm symptoms are present at this time. Discussion of management today. Recommend at home monitoring to rule out white coat HTN.  Plan: - Encourage the patient to monitor blood pressure at home using proper technique. - Reassess blood pressure management if readings consistently remain above 150/90 mmHg.      Other fatigue    The patient is experiencing fatigue with no alarm symptoms present on exam.  Plan:  - Order lab tests to check B12 levels and other relevant markers. - Reevaluate thyroid medication and thyroid levels if necessary. - Consider reevaluating sleep apnea and exploring alternative treatment options if labs are unremarkable.      Relevant Orders   Hemoglobin A1c (Completed)   Vitamin B12 (Completed)   CBC with Differential/Platelet (Completed)   Comprehensive metabolic panel (Completed)   Iron, TIBC and Ferritin Panel (Completed)   VITAMIN D 25 Hydroxy (Vit-D Deficiency, Fractures) (Completed)   Paresthesia of hand,  bilateral    Will monitor labs      Relevant Orders   Hemoglobin A1c (Completed)   Vitamin B12 (Completed)   CBC with Differential/Platelet (Completed)   Comprehensive metabolic panel (Completed)   Iron, TIBC and Ferritin Panel (Completed)   VITAMIN D 25 Hydroxy (Vit-D Deficiency, Fractures) (Completed)   Balance problems   Relevant Orders   Hemoglobin A1c (Completed)   Vitamin B12 (Completed)   CBC with Differential/Platelet (Completed)   Comprehensive metabolic panel (Completed)   Iron, TIBC and Ferritin Panel (Completed)   VITAMIN D 25 Hydroxy (Vit-D Deficiency, Fractures) (Completed)   Dyspnea  on exertion   Relevant Orders   Hemoglobin A1c (Completed)   Vitamin B12 (Completed)   CBC with Differential/Platelet (Completed)   Comprehensive metabolic panel (Completed)   Iron, TIBC and Ferritin Panel (Completed)   VITAMIN D 25 Hydroxy (Vit-D Deficiency, Fractures) (Completed)   Arthritis    The patient is experiencing arthritis in the neck and numbness/tingling. There is mild weakness of the LE's R>L on exam.  Plan: - We will monitor labs today to ensure there are no underlying causes of the symptoms.  - Continue with acupuncture and massage therapy as tolerated. - Gentle stretching and routine exercises as well as good hydration is very important for arthritic pain.  - Continue with Tylenol for pain control.  - Encourage the patient to continue reading the recommended book on managing pain.      Relevant Medications   acetaminophen (TYLENOL) 500 MG tablet   Long-term use of high-risk medication   Relevant Orders   Hemoglobin A1c (Completed)   Vitamin B12 (Completed)   CBC with Differential/Platelet (Completed)   Comprehensive metabolic panel (Completed)   Iron, TIBC and Ferritin Panel (Completed)   VITAMIN D 25 Hydroxy (Vit-D Deficiency, Fractures) (Completed)   Postablative hypothyroidism - Primary   Relevant Orders   Hemoglobin A1c (Completed)   Vitamin B12  (Completed)   CBC with Differential/Platelet (Completed)   Comprehensive metabolic panel (Completed)   Iron, TIBC and Ferritin Panel (Completed)   VITAMIN D 25 Hydroxy (Vit-D Deficiency, Fractures) (Completed)   Osteopenia   Relevant Orders   Hemoglobin A1c (Completed)   Vitamin B12 (Completed)   CBC with Differential/Platelet (Completed)   Comprehensive metabolic panel (Completed)   Iron, TIBC and Ferritin Panel (Completed)   VITAMIN D 25 Hydroxy (Vit-D Deficiency, Fractures) (Completed)     No follow-ups on file.      Osiris Charles, Sung Amabile, NP, DNP, AGNP-C [facility]

## 2022-09-26 LAB — IRON,TIBC AND FERRITIN PANEL
Ferritin: 160 ng/mL — ABNORMAL HIGH (ref 15–150)
Iron Saturation: 20 % (ref 15–55)
Iron: 56 ug/dL (ref 27–139)
Total Iron Binding Capacity: 282 ug/dL (ref 250–450)
UIBC: 226 ug/dL (ref 118–369)

## 2022-09-26 LAB — CBC WITH DIFFERENTIAL/PLATELET
Basos: 1 %
EOS (ABSOLUTE): 0.1 10*3/uL (ref 0.0–0.4)
Eos: 1 %
Immature Grans (Abs): 0 10*3/uL (ref 0.0–0.1)
Immature Granulocytes: 0 %
Lymphocytes Absolute: 2.5 10*3/uL (ref 0.7–3.1)
MCHC: 32.6 g/dL (ref 31.5–35.7)
MCV: 90 fL (ref 79–97)
Neutrophils: 68 %
Platelets: 368 10*3/uL (ref 150–450)
RDW: 13.5 % (ref 11.7–15.4)

## 2022-09-26 LAB — COMPREHENSIVE METABOLIC PANEL
ALT: 22 IU/L (ref 0–32)
AST: 18 IU/L (ref 0–40)
Albumin/Globulin Ratio: 1.8 (ref 1.2–2.2)
Albumin: 4.4 g/dL (ref 3.7–4.7)
Alkaline Phosphatase: 133 IU/L — ABNORMAL HIGH (ref 44–121)
BUN/Creatinine Ratio: 24 (ref 12–28)
BUN: 21 mg/dL (ref 8–27)
CO2: 20 mmol/L (ref 20–29)
Calcium: 9.6 mg/dL (ref 8.7–10.3)
Chloride: 102 mmol/L (ref 96–106)
Glucose: 98 mg/dL (ref 70–99)
Sodium: 138 mmol/L (ref 134–144)
Total Protein: 6.8 g/dL (ref 6.0–8.5)

## 2022-09-26 LAB — VITAMIN B12: Vitamin B-12: 675 pg/mL (ref 232–1245)

## 2022-09-26 LAB — HEMOGLOBIN A1C: Hgb A1c MFr Bld: 5.7 % — ABNORMAL HIGH (ref 4.8–5.6)

## 2022-09-28 ENCOUNTER — Encounter: Payer: Self-pay | Admitting: Nurse Practitioner

## 2022-09-28 DIAGNOSIS — R7303 Prediabetes: Secondary | ICD-10-CM

## 2022-09-28 DIAGNOSIS — R7989 Other specified abnormal findings of blood chemistry: Secondary | ICD-10-CM

## 2022-09-28 DIAGNOSIS — E559 Vitamin D deficiency, unspecified: Secondary | ICD-10-CM

## 2022-09-28 DIAGNOSIS — D72829 Elevated white blood cell count, unspecified: Secondary | ICD-10-CM

## 2022-10-11 MED ORDER — VITAMIN D3 1.25 MG (50000 UT) PO TABS
1.0000 | ORAL_TABLET | ORAL | 3 refills | Status: DC
Start: 2022-10-11 — End: 2022-10-13

## 2022-10-13 ENCOUNTER — Other Ambulatory Visit: Payer: Self-pay

## 2022-10-13 ENCOUNTER — Encounter: Payer: Self-pay | Admitting: Nurse Practitioner

## 2022-10-13 DIAGNOSIS — E559 Vitamin D deficiency, unspecified: Secondary | ICD-10-CM

## 2022-10-13 MED ORDER — VITAMIN D3 1.25 MG (50000 UT) PO TABS
1.0000 | ORAL_TABLET | ORAL | 3 refills | Status: DC
Start: 2022-10-13 — End: 2023-08-27

## 2022-10-17 ENCOUNTER — Encounter (HOSPITAL_COMMUNITY): Payer: Self-pay

## 2022-10-17 ENCOUNTER — Other Ambulatory Visit: Payer: Self-pay

## 2022-10-17 ENCOUNTER — Emergency Department (HOSPITAL_COMMUNITY)
Admission: EM | Admit: 2022-10-17 | Discharge: 2022-10-18 | Disposition: A | Discharge status: Home / Self Care | Payer: Medicare Other | Attending: Emergency Medicine | Admitting: Emergency Medicine

## 2022-10-17 DIAGNOSIS — Y93H2 Activity, gardening and landscaping: Secondary | ICD-10-CM | POA: Insufficient documentation

## 2022-10-17 DIAGNOSIS — W19XXXA Unspecified fall, initial encounter: Secondary | ICD-10-CM

## 2022-10-17 DIAGNOSIS — S0181XA Laceration without foreign body of other part of head, initial encounter: Secondary | ICD-10-CM | POA: Insufficient documentation

## 2022-10-17 DIAGNOSIS — W010XXA Fall on same level from slipping, tripping and stumbling without subsequent striking against object, initial encounter: Secondary | ICD-10-CM | POA: Diagnosis not present

## 2022-10-17 MED ORDER — LIDOCAINE HCL (PF) 1 % IJ SOLN
5.0000 mL | Freq: Once | INTRAMUSCULAR | Status: AC
  Administered 2022-10-17: 5 mL
  Filled 2022-10-17: qty 30

## 2022-10-17 NOTE — ED Provider Notes (Signed)
[place] EMERGENCY DEPARTMENT AT [facility] Provider Note   CSN: [id number] Arrival date & time: 10/17/22  1954     History  Chief Complaint  Patient presents with   Fall    Martha Gilmore is a 82 y.o. female.  HPI   Patient with medical history including hyperlipidemia, atrial flutter, GERD, presents after a fall, states that she was trying to water plants in her garden and unfortunately, got tripped up in the hose causing her to fall, she states that she broke her fall with her arms and legs.  But her glasses on her head got pushed up into her head and broke them.  Now the arm of the the glasses is stuck in her left eyebrow, states that she was nervous to remove it because it is impaled there.  She is that she is not having any eye pain no difficult eye movements, she denies actually hitting her head or losing conscious, she is not on any anticoag's, no neck pain back pain chest pain abdominal pain pain in the upper or lower extremities.  Patient states that only came in because the arm of the glasses is now stuck in her left eyebrow.  She is up-to-date on her tetanus shot, not immunocompromise.    Home Medications Prior to Admission medications   Medication Sig Start Date End Date Taking? Authorizing Provider  acetaminophen (TYLENOL) 500 MG tablet Take 500 mg by mouth every 6 (six) hours as needed.    [provider]  atorvastatin (LIPITOR) 10 MG tablet TAKE 1 TABLET BY MOUTH EVERYDAY AT BEDTIME 01/04/22   Willow Ora, MD  Cholecalciferol (VITAMIN D3) 1.25 MG (50000 UT) TABS Take 1 tablet by mouth once a week. 10/13/22   Tollie Eth, NP  citalopram (CELEXA) 20 MG tablet TAKE 1 TABLET BY MOUTH EVERY DAY 03/01/22   Willow Ora, MD  levothyroxine (SYNTHROID) 100 MCG tablet Take 1 tablet (100 mcg total) by mouth daily before breakfast. Mon- Sat 100 mcg daily, and Sun 150 mcg daily Patient taking differently: Take 100 mcg by mouth daily before breakfast. 1  daily 06/28/22   Willow Ora, MD  Multiple Vitamins-Minerals (PRESERVISION AREDS PO) Take 1 capsule by mouth in the morning and at bedtime.    [provider]  omeprazole (PRILOSEC) 20 MG capsule TAKE 1 CAPSULE BY MOUTH EVERY DAY 05/29/22   Willow Ora, MD  oxybutynin (DITROPAN-XL) 10 MG 24 hr tablet TAKE ONE TABLET BY MOUTH AT BEDTIME 07/13/22   Willow Ora, MD  Polyethyl Glycol-Propyl Glycol (SYSTANE OP) Place 1 drop into both eyes daily as needed (DRY EYES).    [provider]      Allergies    Aspirin, Demerol  [meperidine hcl], Penicillins, Statins, Cephalexin, Clindamycin/lincomycin, Diclofenac, and Tape    Review of Systems   Review of Systems  Constitutional:  Negative for chills and fever.  Respiratory:  Negative for shortness of breath.   Cardiovascular:  Negative for chest pain.  Gastrointestinal:  Negative for abdominal pain.  Neurological:  Negative for headaches.    Physical Exam Updated Vital Signs BP (!) 155/65   Pulse (!) 50   Temp 98.2 F (36.8 C)   Resp 18   Ht 5\' 1"  (1.549 m)   Wt 77.1 kg   SpO2 97%   BMI 32.12 kg/m  Physical Exam Vitals and nursing note reviewed.  Constitutional:      General: She is not in acute distress.  Appearance: She is not ill-appearing.  HENT:     Head: Normocephalic and atraumatic.     Comments: No raccoon eyes or Battle sign noted, no deformity of the head presents.    Nose: No congestion.     Mouth/Throat:     Mouth: Mucous membranes are moist.     Pharynx: Oropharynx is clear. No oropharyngeal exudate or posterior oropharyngeal erythema.     Comments: No trismus no torticollis no oral trauma present. Eyes:     Extraocular Movements: Extraocular movements intact.     Conjunctiva/sclera: Conjunctivae normal.     Pupils: Pupils are equal, round, and reactive to light.     Comments: On the left lateral aspect of the eyebrow has the arm of the sunglasses stuck superficially in the eyebrow,  hemodynamically stable, EOMs intact, PERRLA, no tenderness around the periorbital region.  Cardiovascular:     Rate and Rhythm: Normal rate and regular rhythm.     Pulses: Normal pulses.     Heart sounds: No murmur heard.    No friction rub. No gallop.  Pulmonary:     Effort: No respiratory distress.     Breath sounds: No wheezing, rhonchi or rales.     Comments: There is no chest deformity, chest is nontender lung sounds clear bilaterally. Abdominal:     Palpations: Abdomen is soft.     Tenderness: There is no abdominal tenderness. There is no right CVA tenderness or left CVA tenderness.     Comments: No deformity of the abdomen, abdomen is soft nontender.  Musculoskeletal:     Comments: Spine was palpated was nontender to palpation no step-off deformities noted no pelvis instability no leg shortening.  Moving her upper and lower extremities without difficulty.  Skin:    General: Skin is warm and dry.     Comments: Noted abrasions on her knee is superficial.  Neurological:     Mental Status: She is alert.     Comments: No facial asymmetry no difficulty with word finding following two-step commands no unilateral weakness present.  Psychiatric:        Mood and Affect: Mood normal.     ED Results / Procedures / Treatments   Labs (all labs ordered are listed, but only abnormal results are displayed) Labs Reviewed - No data to display  EKG None  Radiology No results found.  Procedures .Marland KitchenLaceration Repair  Date/Time: 10/18/2022 12:13 AM  Performed by: Carroll Sage, PA-C Authorized by: Carroll Sage, PA-C   Consent:    Consent obtained:  Verbal   Consent given by:  Patient   Risks, benefits, and alternatives were discussed: yes     Risks discussed:  Infection, pain, retained foreign body, tendon damage, vascular damage, poor wound healing, poor cosmetic result, need for additional repair and nerve damage   Alternatives discussed:  No treatment Universal  protocol:    Patient identity confirmed:  Verbally with patient Anesthesia:    Anesthesia method:  Local infiltration   Local anesthetic:  Lidocaine 1% w/o epi Laceration details:    Location:  Face   Face location:  L eyebrow   Length (cm):  0.5   Depth (mm):  1 Pre-procedure details:    Preparation:  Patient was prepped and draped in usual sterile fashion Exploration:    Limited defect created (wound extended): no     Wound exploration: wound explored through full range of motion and entire depth of wound visualized     Contaminated: no  Treatment:    Area cleansed with:  Saline   Amount of cleaning:  Standard   Irrigation method:  Pressure wash Skin repair:    Repair method:  Sutures   Suture size:  5-0   Suture material:  Prolene   Suture technique:  Simple interrupted   Number of sutures:  1 Approximation:    Approximation:  Loose Repair type:    Repair type:  Simple Post-procedure details:    Dressing:  Non-adherent dressing   Procedure completion:  Tolerated well, no immediate complications     Medications Ordered in ED Medications  bacitracin ointment (1 Application Topical Given 10/18/22 0010)  lidocaine (PF) (XYLOCAINE) 1 % injection 5 mL (5 mLs Infiltration Given 10/17/22 2336)    ED Course/ Medical Decision Making/ A&P                             Medical Decision Making Risk OTC drugs. Prescription drug management.   This patient presents to the ED for concern of fall, this involves an extensive number of treatment options, and is a complaint that carries with it a high risk of complications and morbidity.  The differential diagnosis includes intracranial bleed, thoracic/abdominal trauma, orthopedic injury    Additional history obtained:  Additional history obtained from family at time External records from outside source obtained and reviewed including PCP notes   Co morbidities that complicate the patient evaluation  Atrial  fibrillation  Social Determinants of Health:  Geriatric    Lab Tests:  I Ordered, and personally interpreted labs.  The pertinent results include: N/A   Imaging Studies ordered:  I ordered imaging studies including N/A I independently visualized and interpreted imaging which showed N/A I agree with the radiologist interpretation   Cardiac Monitoring:  The patient was maintained on a cardiac monitor.  I personally viewed and interpreted the cardiac monitored which showed an underlying rhythm of: N/A   Medicines ordered and prescription drug management:  I ordered medication including lidocaine I have reviewed the patients home medicines and have made adjustments as needed  Critical Interventions:  N/A   Reevaluation:  Presents after a fall, she had a benign physical exam, patient had the arm of the glasses stuck in her left eyebrow, I was able to remove without difficulty.  There is a small half centimeter laceration above the left eyebrow, jagged borders, slightly gaping, recommend suturing for improved wound healing.  Patient is agreement this plan tolerated well.  Patient is in agreement with discharge at this time.  Consultations Obtained:  N/a    Test Considered:  CT head-shared decision making this will be deferred as patient states she did not actually hit her head,  not endorsing any headaches change in vision paresthesia weakness upper lower extremities, find this reasonable as she has no symptoms she has no focal deficits, I did offer imaging of the head or face with patient would rather come back of she develops symptoms.    Rule out low suspicion for intracranial head bleed as patient denies loss of conscious, is not on anticoagulant, she does not endorse headaches, paresthesia/weakness in the upper and lower extremities, no focal deficits present on my exam.  Low suspicion for spinal cord abnormality or spinal fracture spine was palpated was nontender  to palpation, patient has full range of motion in the upper and lower extremities.  I doubt orbital fracture EOM fully intact having no pain on palpation.  Doubt thoracic/trauma trauma no evidence of trauma on my exam both was nontender to palpation.    Dispostion and problem list  After consideration of the diagnostic results and the patients response to treatment, I feel that the patent would benefit from discharge.  Patient will work-patient has a very small laceration which I repaired, she is not immunocompromise, wound was not contaminated, will defer antibiotics, will recommend basic wound care, follow-up PCP as needed.            Final Clinical Impression(s) / ED Diagnoses Final diagnoses:  Fall, initial encounter  Facial laceration, initial encounter    Rx / DC Orders ED Discharge Orders     None         Carroll Sage, PA-C 10/18/22 0014    Tilden Fossa, MD 10/18/22 [phone]

## 2022-10-17 NOTE — ED Notes (Signed)
William PA in room to assess pt, removed imbedded part of pts glasses above left eyebrow. Pt tolerated well

## 2022-10-17 NOTE — ED Provider Notes (Incomplete)
[place] EMERGENCY DEPARTMENT AT [facility] Provider Note   CSN: [id number] Arrival date & time: 10/17/22  1954     History {Add pertinent medical, surgical, social history, OB history to HPI:1} Chief Complaint  Patient presents with  . Fall    Martha Gilmore is a 82 y.o. female.  HPI   Patient with medical history including hyperlipidemia, atrial flutter, GERD, presents after a fall, states that she was trying to water plants in her garden and unfortunately, got tripped up in the hose causing her to fall, she states that she broke her fall with her arms and legs.  But her glasses on her head got pushed up into her head and broke them.  Now the arm of the the glasses is stuck in her left eyebrow, states that she was nervous to remove it because it is impaled there.  She is that she is not having any eye pain no difficult eye movements, she denies actually hitting her head or losing conscious, she is not on any anticoag's, no neck pain back pain chest pain abdominal pain pain in the upper or lower extremities.  Patient states that only came in because the arm of the glasses is now stuck in her left eyebrow.  She is up-to-date on her tetanus shot, not immunocompromise.    Home Medications Prior to Admission medications   Medication Sig Start Date End Date Taking? Authorizing Provider  acetaminophen (TYLENOL) 500 MG tablet Take 500 mg by mouth every 6 (six) hours as needed.    [provider]  atorvastatin (LIPITOR) 10 MG tablet TAKE 1 TABLET BY MOUTH EVERYDAY AT BEDTIME 01/04/22   Willow Ora, MD  Cholecalciferol (VITAMIN D3) 1.25 MG (50000 UT) TABS Take 1 tablet by mouth once a week. 10/13/22   Tollie Eth, NP  citalopram (CELEXA) 20 MG tablet TAKE 1 TABLET BY MOUTH EVERY DAY 03/01/22   Willow Ora, MD  levothyroxine (SYNTHROID) 100 MCG tablet Take 1 tablet (100 mcg total) by mouth daily before breakfast. Mon- Sat 100 mcg daily, and Sun 150 mcg  daily Patient taking differently: Take 100 mcg by mouth daily before breakfast. 1 daily 06/28/22   Willow Ora, MD  Multiple Vitamins-Minerals (PRESERVISION AREDS PO) Take 1 capsule by mouth in the morning and at bedtime.    [provider]  omeprazole (PRILOSEC) 20 MG capsule TAKE 1 CAPSULE BY MOUTH EVERY DAY 05/29/22   Willow Ora, MD  oxybutynin (DITROPAN-XL) 10 MG 24 hr tablet TAKE ONE TABLET BY MOUTH AT BEDTIME 07/13/22   Willow Ora, MD  Polyethyl Glycol-Propyl Glycol (SYSTANE OP) Place 1 drop into both eyes daily as needed (DRY EYES).    [provider]      Allergies    Aspirin, Demerol  [meperidine hcl], Penicillins, Statins, Cephalexin, Clindamycin/lincomycin, Diclofenac, and Tape    Review of Systems   Review of Systems  Constitutional:  Negative for chills and fever.  Respiratory:  Negative for shortness of breath.   Cardiovascular:  Negative for chest pain.  Gastrointestinal:  Negative for abdominal pain.  Neurological:  Negative for headaches.    Physical Exam Updated Vital Signs BP (!) 166/106 (BP Location: Left Arm)   Pulse (!) 58   Temp 98.2 F (36.8 C)   Resp 18   Ht 5\' 1"  (1.549 m)   Wt 77.1 kg   SpO2 99%   BMI 32.12 kg/m  Physical Exam Vitals and nursing note reviewed.  Constitutional:      General: She is not in acute distress.    Appearance: She is not ill-appearing.  HENT:     Head: Normocephalic and atraumatic.     Comments: No raccoon eyes or Battle sign noted, no deformity of the head presents.    Nose: No congestion.     Mouth/Throat:     Mouth: Mucous membranes are moist.     Pharynx: Oropharynx is clear. No oropharyngeal exudate or posterior oropharyngeal erythema.     Comments: No trismus no torticollis no oral trauma present. Eyes:     Extraocular Movements: Extraocular movements intact.     Conjunctiva/sclera: Conjunctivae normal.     Pupils: Pupils are equal, round, and reactive to light.     Comments: On the  left lateral aspect of the eyebrow has the arm of the sunglasses stuck superficially in the eyebrow, hemodynamically stable, EOMs intact, PERRLA, no tenderness around the periorbital region.  Cardiovascular:     Rate and Rhythm: Normal rate and regular rhythm.     Pulses: Normal pulses.     Heart sounds: No murmur heard.    No friction rub. No gallop.  Pulmonary:     Effort: No respiratory distress.     Breath sounds: No wheezing, rhonchi or rales.     Comments: There is no chest deformity, chest is nontender lung sounds clear bilaterally. Abdominal:     Palpations: Abdomen is soft.     Tenderness: There is no abdominal tenderness. There is no right CVA tenderness or left CVA tenderness.     Comments: No deformity of the abdomen, abdomen is soft nontender.  Musculoskeletal:     Comments: Spine was palpated was nontender to palpation no step-off deformities noted no pelvis instability no leg shortening.  Moving her upper and lower extremities without difficulty.  Skin:    General: Skin is warm and dry.     Comments: Noted abrasions on her knee is superficial.  Neurological:     Mental Status: She is alert.     Comments: No facial asymmetry no difficulty with word finding following two-step commands no unilateral weakness present.  Psychiatric:        Mood and Affect: Mood normal.     ED Results / Procedures / Treatments   Labs (all labs ordered are listed, but only abnormal results are displayed) Labs Reviewed - No data to display  EKG None  Radiology No results found.  Procedures Procedures  {Document cardiac monitor, telemetry assessment procedure when appropriate:1}  Medications Ordered in ED Medications  lidocaine (PF) (XYLOCAINE) 1 % injection 5 mL (5 mLs Infiltration Given 10/17/22 2336)    ED Course/ Medical Decision Making/ A&P   {   Click here for ABCD2, HEART and other calculatorsREFRESH Note before signing :1}                          Medical Decision  Making Risk Prescription drug management.   This patient presents to the ED for concern of fall, this involves an extensive number of treatment options, and is a complaint that carries with it a high risk of complications and morbidity.  The differential diagnosis includes intracranial bleed, thoracic/abdominal trauma, orthopedic injury    Additional history obtained:  Additional history obtained from family at time External records from outside source obtained and reviewed including PCP notes   Co morbidities that complicate the patient evaluation  Atrial fibrillation  Social Determinants  of Health:  Geriatric    Lab Tests:  I Ordered, and personally interpreted labs.  The pertinent results include: N/A   Imaging Studies ordered:  I ordered imaging studies including N/A I independently visualized and interpreted imaging which showed N/A I agree with the radiologist interpretation   Cardiac Monitoring:  The patient was maintained on a cardiac monitor.  I personally viewed and interpreted the cardiac monitored which showed an underlying rhythm of: N/A   Medicines ordered and prescription drug management:  I ordered medication including lidocaine I have reviewed the patients home medicines and have made adjustments as needed  Critical Interventions:  N/A   Reevaluation:  Presents after a fall, she had a benign physical exam, patient had the arm of the glasses stuck in her left eyebrow, I was able to remove without difficulty.  There is a small half centimeter laceration above the left eyebrow, jagged borders, slightly gaping, recommend suturing for improved wound healing.  Patient is agreement this plan tolerated well.  Patient is in agreement with discharge at this time.  Consultations Obtained:  N/a    Test Considered:  CT head-shared decision making this will be deferred as patient states she did not actually hit her head,  not endorsing any  headaches change in vision paresthesia weakness upper lower extremities, find this reasonable as she has no symptoms she has no focal deficits, I did offer imaging of the head or face with patient would rather come back of she develops symptoms.    Rule out low suspicion for intracranial head bleed as patient denies loss of conscious, is not on anticoagulant, she does not endorse headaches, paresthesia/weakness in the upper and lower extremities, no focal deficits present on my exam.  Low suspicion for spinal cord abnormality or spinal fracture spine was palpated was nontender to palpation, patient has full range of motion in the upper and lower extremities.  I doubt orbital fracture EOM fully intact having no pain on palpation.    Dispostion and problem list  After consideration of the diagnostic results and the patients response to treatment, I feel that the patent would benefit from ***.       {Document critical care time when appropriate:1} {Document review of labs and clinical decision tools ie heart score, Chads2Vasc2 etc:1}  {Document your independent review of radiology images, and any outside records:1} {Document your discussion with family members, caretakers, and with consultants:1} {Document social determinants of health affecting pt's care:1} {Document your decision making why or why not admission, treatments were needed:1} Final Clinical Impression(s) / ED Diagnoses Final diagnoses:  None    Rx / DC Orders ED Discharge Orders     None

## 2022-10-17 NOTE — ED Triage Notes (Signed)
Patient reports working outside and falling in garden from tangled garden hose. States she hit her head, denies LOC. Piece of glasses stuck to left eyebrow with dried blood (does not appear impaled), abrasion noted to left knee and left hand. Not on blood thinners. A&Ox4.

## 2022-10-17 NOTE — ED Notes (Signed)
Suture cart at bedside 

## 2022-10-18 MED ORDER — BACITRACIN ZINC 500 UNIT/GM EX OINT
TOPICAL_OINTMENT | Freq: Two times a day (BID) | CUTANEOUS | Status: DC
  Administered 2022-10-18: 1 via TOPICAL
  Filled 2022-10-18: qty 0.9

## 2022-10-18 NOTE — ED Notes (Signed)
Wound care done and bandage applied

## 2022-10-18 NOTE — Discharge Instructions (Addendum)
Small laceration received 1 sutures, please refrain from getting wet for today, starting tomorrow please rinse out the wound and apply new dressings, do this twice daily.  May use over-the-counter pain medication as needed.  To help decrease scarring please stay out of direct sunlight for the first 6 weeks.  Follow-up next 5 days for suture removal either at this department urgent care or your primary care provider.  If you notice worsening redness swelling discharge or worsening pain in the area please come back in for reassessment

## 2022-10-22 ENCOUNTER — Encounter: Payer: Self-pay | Admitting: Nurse Practitioner

## 2022-10-22 DIAGNOSIS — R2689 Other abnormalities of gait and mobility: Secondary | ICD-10-CM | POA: Insufficient documentation

## 2022-10-22 DIAGNOSIS — R0609 Other forms of dyspnea: Secondary | ICD-10-CM | POA: Insufficient documentation

## 2022-10-22 DIAGNOSIS — M1990 Unspecified osteoarthritis, unspecified site: Secondary | ICD-10-CM | POA: Insufficient documentation

## 2022-10-22 DIAGNOSIS — R202 Paresthesia of skin: Secondary | ICD-10-CM | POA: Insufficient documentation

## 2022-10-22 NOTE — Assessment & Plan Note (Signed)
The patient has gastroesophageal reflux disease (GERD). Discussion with the risks vs. benefits of the medication. Previous trials off of the medication have been unsuccessful in the past.  Plan: - Continue omeprazole as the benefits outweigh the risks at this time. - Monitor for any changes in symptoms or side effects.

## 2022-10-22 NOTE — Assessment & Plan Note (Signed)
Will monitor labs 

## 2022-10-22 NOTE — Assessment & Plan Note (Signed)
Blood pressure is elevated in the office today. No alarm symptoms are present at this time. Discussion of management today. Recommend at home monitoring to rule out white coat HTN.  Plan: - Encourage the patient to monitor blood pressure at home using proper technique. - Reassess blood pressure management if readings consistently remain above 150/90 mmHg.

## 2022-10-22 NOTE — Assessment & Plan Note (Signed)
The patient has sleep apnea and is currently not using CPAP. We discussed alternative options to CPAP if this is not tolerated. Consider repeat evaluation to determine if other therapies may be helpful.  Plan: - Reevaluate the need for sleep apnea treatment and explore alternative options if necessary. - Coordinate with a sleep specialist for further evaluation and management. We will plan for new provider.

## 2022-10-22 NOTE — Assessment & Plan Note (Signed)
The patient is experiencing fatigue with no alarm symptoms present on exam.  Plan:  - Order lab tests to check B12 levels and other relevant markers. - Reevaluate thyroid medication and thyroid levels if necessary. - Consider reevaluating sleep apnea and exploring alternative treatment options if labs are unremarkable.

## 2022-10-22 NOTE — Assessment & Plan Note (Signed)
The patient has anxiety and depression currently managed with citalopram. Mood is stable at this time with no alarm symptoms.  Plan: - Continue Citalopram as the patient reports it as their go-to medication. - Monitor for any changes in mood or anxiety levels.

## 2022-10-22 NOTE — Assessment & Plan Note (Signed)
The patient has a history of atrial flutter and ablation. HRRR on examination today with no alarm symptoms present. Ablation in 2023. Plan: - Continue monitoring for any recurrence of symptoms or palpitations. - Follow up with a cardiologist as needed.

## 2022-10-22 NOTE — Assessment & Plan Note (Signed)
The patient is experiencing arthritis in the neck and numbness/tingling. There is mild weakness of the LE's R>L on exam.  Plan: - We will monitor labs today to ensure there are no underlying causes of the symptoms.  - Continue with acupuncture and massage therapy as tolerated. - Gentle stretching and routine exercises as well as good hydration is very important for arthritic pain.  - Continue with Tylenol for pain control.  - Encourage the patient to continue reading the recommended book on managing pain.

## 2022-10-25 ENCOUNTER — Other Ambulatory Visit: Payer: Medicare Other

## 2022-10-25 DIAGNOSIS — D72829 Elevated white blood cell count, unspecified: Secondary | ICD-10-CM

## 2022-10-25 DIAGNOSIS — R7989 Other specified abnormal findings of blood chemistry: Secondary | ICD-10-CM

## 2022-10-25 LAB — CBC WITH DIFFERENTIAL/PLATELET
Basophils Absolute: 0.1 10*3/uL (ref 0.0–0.2)
Basos: 1 %
Hematocrit: 42.1 % (ref 34.0–46.6)
Immature Granulocytes: 0 %
MCHC: 32.1 g/dL (ref 31.5–35.7)
MCV: 90 fL (ref 79–97)
Monocytes Absolute: 0.6 10*3/uL (ref 0.1–0.9)
RBC: 4.7 x10E6/uL (ref 3.77–5.28)
RDW: 13.8 % (ref 11.7–15.4)

## 2022-10-25 LAB — IRON,TIBC AND FERRITIN PANEL

## 2022-10-26 LAB — CBC WITH DIFFERENTIAL/PLATELET
EOS (ABSOLUTE): 0.1 10*3/uL (ref 0.0–0.4)
Eos: 2 %
Hemoglobin: 13.5 g/dL (ref 11.1–15.9)
Immature Grans (Abs): 0 10*3/uL (ref 0.0–0.1)
Lymphocytes Absolute: 1.7 10*3/uL (ref 0.7–3.1)
Lymphs: 24 %
MCH: 28.7 pg (ref 26.6–33.0)
Monocytes: 8 %
Neutrophils Absolute: 4.7 10*3/uL (ref 1.4–7.0)
Neutrophils: 65 %
Platelets: 293 10*3/uL (ref 150–450)
WBC: 7.1 10*3/uL (ref 3.4–10.8)

## 2022-10-26 LAB — IRON,TIBC AND FERRITIN PANEL
Ferritin: 175 ng/mL — ABNORMAL HIGH (ref 15–150)
Iron Saturation: 25 % (ref 15–55)
UIBC: 189 ug/dL (ref 118–369)

## 2022-10-30 ENCOUNTER — Encounter: Payer: Self-pay | Admitting: Nurse Practitioner

## 2022-11-08 ENCOUNTER — Ambulatory Visit: Payer: Medicare Other | Admitting: Family Medicine

## 2022-11-30 ENCOUNTER — Telehealth: Payer: Self-pay

## 2022-11-30 ENCOUNTER — Other Ambulatory Visit: Payer: Self-pay

## 2022-11-30 ENCOUNTER — Ambulatory Visit: Payer: Medicare Other | Admitting: Family Medicine

## 2022-11-30 VITALS — BP 130/68 | HR 66 | Ht 61.0 in | Wt 173.0 lb

## 2022-11-30 DIAGNOSIS — G8929 Other chronic pain: Secondary | ICD-10-CM | POA: Diagnosis not present

## 2022-11-30 DIAGNOSIS — M25561 Pain in right knee: Secondary | ICD-10-CM | POA: Diagnosis not present

## 2022-11-30 DIAGNOSIS — M1711 Unilateral primary osteoarthritis, right knee: Secondary | ICD-10-CM | POA: Diagnosis not present

## 2022-11-30 NOTE — Progress Notes (Signed)
   Rubin Payor, PhD, LAT, ATC acting as a scribe for [name], MD.  Martha Gilmore is a 82 y.o. female who presents to [facility] at [facility] today for re-occurring R knee pain. Pt was last seen by Dr. Denyse Amass on 09/19/22 and was given a R knee steroid injection. Of note, pt suffered a fall on June 25th and was seen at the [facility] ED.  Today, pt reports R knee pain has returned over the last months, but significantly worsening over the last 10 days. She is really having a lot of pain on stairs. No swelling present. She has been doing a lot of packing/moving of things around in her house after the passing of her husband.  She is going to [place] next week.  She would be gone about 2 weeks.  Dx imaging: 04/05/22 R knee XR   Pertinent review of systems: No fevers or chills  Relevant historical information: Sleep apnea.   Exam:  BP 130/68   Pulse 66   Ht 5\' 1"  (1.549 m)   Wt 173 lb (78.5 kg)   SpO2 98%   BMI 32.69 kg/m  General: Well Developed, well nourished, and in no acute distress.   MSK: Mild effusion normal motion with crepitation.  Tender palpation medial joint line. Intact strength. Stable ligamentous exam.    Lab and Radiology Results  Procedure: Real-time Ultrasound Guided Injection of right knee joint superior lateral patella space Device: Philips Affiniti 50G/GE Logiq Images permanently stored and available for review in PACS Verbal informed consent obtained.  Discussed risks and benefits of procedure. Warned about infection, bleeding, hyperglycemia damage to structures among others. Patient expresses understanding and agreement Time-out conducted.   Noted no overlying erythema, induration, or other signs of local infection.   Skin prepped in a sterile fashion.   Local anesthesia: Topical Ethyl chloride.   With sterile technique and under real time ultrasound guidance: 40 mg of Kenalog and 2 mL of Marcaine injected into knee joint.  Fluid seen entering the joint capsule.   Completed without difficulty   Pain immediately resolved suggesting accurate placement of the medication.   Advised to call if fevers/chills, erythema, induration, drainage, or persistent bleeding.   Images permanently stored and available for review in the ultrasound unit.  Impression: Technically successful ultrasound guided injection.       Assessment and Plan: 82 y.o. female with right knee pain due to exacerbation of DJD.  Plan for steroid injection today. Recheck back as needed.  Will go ahead and work on authorization of hyaluronic acid injections and Zilretta injections.  We could anticipate doing those injections if her pain returns sooner than 3 months.   PDMP not reviewed this encounter. Orders Placed This Encounter  Procedures   [place] LIMITED JOINT SPACE STRUCTURES LOW RIGHT(NO LINKED CHARGES)    Order Specific Question:   Reason for Exam (SYMPTOM  OR DIAGNOSIS REQUIRED)    Answer:   right knee pain    Order Specific Question:   Preferred imaging location?    Answer:   [facility]   No orders of the defined types were placed in this encounter.    Discussed warning signs or symptoms. Please see discharge instructions. Patient expresses understanding.   The above documentation has been reviewed and is accurate and complete [name], M.D.

## 2022-11-30 NOTE — Patient Instructions (Addendum)
Thank you for coming in today.   You received an injection today. Seek immediate medical attention if the joint becomes red, extremely painful, or is oozing fluid.   Have a great trip!  We will work to authorize gel shots and Zilretta  Check back as needed

## 2022-11-30 NOTE — Telephone Encounter (Signed)
Rodolph Bong, MD  Dierdre Searles, CMA Pleae auth right knee Zilretta and Gel shots No urgency. Anticiate doing it in like 2-3 months.

## 2022-12-04 NOTE — Telephone Encounter (Signed)
VOB initiated for Zilretta for RIGHT knee OA.  

## 2022-12-05 NOTE — Telephone Encounter (Signed)
 No Prior Auth required

## 2022-12-05 NOTE — Telephone Encounter (Signed)
 VOB initiated for GELSYN for RIGHT knee OA.

## 2022-12-05 NOTE — Telephone Encounter (Signed)
Zilretta for RIGHT knee OA  (Would like to do in 2-3 months)  Primary Insurance: [facility] Medicare PPO Co-pay: $30 Co-insurance: 10% Deductible: does not apply Prior Auth: NOT required   Knee Injection History 04/05/22 - Kenalog RIGHT 09/19/22 - Kenalog RIGHT 11/30/22 - Kenalog RIGHT

## 2022-12-06 NOTE — Telephone Encounter (Addendum)
GELSYN for RIGHT knee OA (Also checking Zilretta)  Primary Insurance: [employer] Medicare  Co-pay: $30 Co-insurance: 0% Deductible: does not apply Prior Auth: NOT required   Knee Injection History: 04/05/22 Kenalog RIGHT 09/19/22 Kenalog RIGHT 11/30/22 Kenalog RIGHT

## 2022-12-06 NOTE — Telephone Encounter (Signed)
No prior auth required

## 2022-12-07 NOTE — Telephone Encounter (Signed)
In the note for Zilretta, it says she was wanting to do that in 2-3 months. Is that the same for Gelsyn?

## 2022-12-07 NOTE — Telephone Encounter (Signed)
Holing for patient.

## 2023-01-01 ENCOUNTER — Ambulatory Visit: Payer: Medicare Other | Attending: Cardiology | Admitting: Cardiology

## 2023-01-01 ENCOUNTER — Encounter: Payer: Self-pay | Admitting: Cardiology

## 2023-01-01 VITALS — BP 118/62 | HR 82 | Ht 60.0 in | Wt 173.3 lb

## 2023-01-01 DIAGNOSIS — I10 Essential (primary) hypertension: Secondary | ICD-10-CM | POA: Diagnosis not present

## 2023-01-01 DIAGNOSIS — G4733 Obstructive sleep apnea (adult) (pediatric): Secondary | ICD-10-CM

## 2023-01-01 DIAGNOSIS — I483 Typical atrial flutter: Secondary | ICD-10-CM

## 2023-01-01 DIAGNOSIS — Z79899 Other long term (current) drug therapy: Secondary | ICD-10-CM

## 2023-01-01 DIAGNOSIS — I351 Nonrheumatic aortic (valve) insufficiency: Secondary | ICD-10-CM | POA: Diagnosis not present

## 2023-01-01 NOTE — Patient Instructions (Addendum)
Medication Instructions:  Your physician recommends that you continue on your current medications as directed. Please refer to the Current Medication list given to you today.  *If you need a refill on your cardiac medications before your next appointment, please call your pharmacy*   Lab Work: None   Testing/Procedures: None   Follow-Up: At [facility], you and your health needs are our priority.  As part of our continuing mission to provide you with exceptional heart care, we have created designated Provider Care Teams.  These Care Teams include your primary Cardiologist (physician) and Advanced Practice Providers (APPs -  Physician Assistants and Nurse Practitioners) who all work together to provide you with the care you need, when you need it.   Your next appointment:   9 month(s)  Provider:   Kardie Tobb, DO   

## 2023-01-01 NOTE — Progress Notes (Signed)
Cardiology Office Note:    Date:  01/01/2023   ID:  Martha Gilmore, DOB [birth year], MRN [id number]  PCP:  Tollie Eth, NP  Cardiologist:  Thomasene Ripple, DO  Electrophysiologist:  Regan Lemming, MD   Referring MD: Willow Ora, MD   Chief Complaint  Patient presents with   Follow-up    History of Present Illness:    Martha Gilmore is a 82 y.o. female with a hx of atrial flutter status post ablation on Eliquis, aortic insufficiency, hypertension, hyperlipidemia, OSA here today for follow-up visit.   She is here today with her niece who is visiting from [place].  Did not complain that she has since been CPAP machine and would like to get connected with our sleep team.  Recently she was seen in the emergency department because she fell tripped over a water hose thankfully no significant injury.  No other complaints at this time.  Past Medical History:  Diagnosis Date   Allergy    Aortic insufficiency    mild to moderate by echo 12/2020   Arthritis    Atrial flutter (HCC) 12/30/2020   Cataracts, bilateral 09/23/2015   Clostridium difficile infection 04/16/2017   H/o C.diff due to clindamicyin 09/2016; two rounds of treatment for cure. Dr. Elnoria Howard   Depression    GERD (gastroesophageal reflux disease)    Heart murmur    Hyperlipidemia    OAB (overactive bladder) 12/16/2013   Osteopenia 08/05/2012   Dexa scan 07/2012 mild osteopenia, T = -1.1   Seasonal allergies    Sleep apnea    Thyroid disease    Urge and stress incontinence     Past Surgical History:  Procedure Laterality Date   A-FLUTTER ABLATION N/A 10/14/2021   Procedure: A-FLUTTER ABLATION;  Surgeon: Regan Lemming, MD;  Location: [facility] INVASIVE CV LAB;  Service: Cardiovascular;  Laterality: N/A;   APPENDECTOMY  1971   CARDIOVERSION N/A 04/13/2021   Procedure: CARDIOVERSION;  Surgeon: Christell Constant, MD;  Location: [facility] ENDOSCOPY;  Service: Cardiovascular;  Laterality: N/A;   CESAREAN SECTION      CHOLECYSTECTOMY     EYE SURGERY  cataracts   Teeth Implants     TRIGGER FINGER RELEASE     VAGINAL HYSTERECTOMY      Current Medications: Current Meds  Medication Sig   acetaminophen (TYLENOL) 500 MG tablet Take 500 mg by mouth every 6 (six) hours as needed.   atorvastatin (LIPITOR) 10 MG tablet TAKE 1 TABLET BY MOUTH EVERYDAY AT BEDTIME   Cholecalciferol (VITAMIN D3) 1.25 MG (50000 UT) TABS Take 1 tablet by mouth once a week.   citalopram (CELEXA) 20 MG tablet TAKE 1 TABLET BY MOUTH EVERY DAY   levothyroxine (SYNTHROID) 100 MCG tablet Take 1 tablet (100 mcg total) by mouth daily before breakfast. Mon- Sat 100 mcg daily, and Sun 150 mcg daily (Patient taking differently: Take 100 mcg by mouth daily before breakfast.)   Multiple Vitamins-Minerals (PRESERVISION AREDS PO) Take 1 capsule by mouth in the morning and at bedtime.   omeprazole (PRILOSEC) 20 MG capsule TAKE 1 CAPSULE BY MOUTH EVERY DAY   oxybutynin (DITROPAN-XL) 10 MG 24 hr tablet TAKE ONE TABLET BY MOUTH AT BEDTIME   Polyethyl Glycol-Propyl Glycol (SYSTANE OP) Place 1 drop into both eyes daily as needed (DRY EYES).     Allergies:   Aspirin, Demerol  [meperidine hcl], Penicillins, Statins, Cephalexin, Clindamycin/lincomycin, Diclofenac, and Tape   Social History   Socioeconomic History   Marital status: Widowed  Spouse name: Not on file   Number of children: Not on file   Years of education: Not on file   Highest education level: Some college, no degree  Occupational History   Not on file  Tobacco Use   Smoking status: Never    Passive exposure: Yes   Smokeless tobacco: Never   Tobacco comments:    grew up in house of smokers  Vaping Use   Vaping status: Never Used  Substance and Sexual Activity   Alcohol use: No   Drug use: No   Sexual activity: Not Currently    Partners: Male    Birth control/protection: None  Other Topics Concern   Not on file  Social History Narrative   Not on file   Social  Determinants of Health   Financial Resource Strain: Low Risk  (07/20/2022)   Overall Financial Resource Strain (CARDIA)    Difficulty of Paying Living Expenses: Not hard at all  Food Insecurity: No Food Insecurity (07/20/2022)   Hunger Vital Sign    Worried About Running Out of Food in the Last Year: Never true    Ran Out of Food in the Last Year: Never true  Transportation Needs: No Transportation Needs (07/20/2022)   PRAPARE - Administrator, Civil Service (Medical): No    Lack of Transportation (Non-Medical): No  Physical Activity: Inactive (07/20/2022)   Exercise Vital Sign    Days of Exercise per Week: 0 days    Minutes of Exercise per Session: 0 min  Stress: No Stress Concern Present (07/20/2022)   [employer] of Occupational Health - Occupational Stress Questionnaire    Feeling of Stress : Not at all  Social Connections: Moderately Integrated (07/20/2022)   Social Connection and Isolation Panel [NHANES]    Frequency of Communication with Friends and Family: More than three times a week    Frequency of Social Gatherings with Friends and Family: More than three times a week    Attends Religious Services: More than 4 times per year    Active Member of [employer] or Organizations: Yes    Attends Banker Meetings: More than 4 times per year    Marital Status: Widowed     Family History: The patient's family history includes Arthritis in her mother and sister; Brain cancer in her brother; Cancer in her daughter, father, sister, and sister; Diabetes in her sister; Early death in her sister; Heart disease in her father, maternal grandmother, mother, and sister; Hypertension in her mother; Kidney cancer in her sister; Lung cancer in her sister; Miscarriages / Stillbirths in her mother; Obesity in her father, mother, and sister.  ROS:   Review of Systems  Constitution: Negative for decreased appetite, fever and weight gain.  HENT: Negative for congestion, ear  discharge, hoarse voice and sore throat.   Eyes: Negative for discharge, redness, vision loss in right eye and visual halos.  Cardiovascular: Negative for chest pain, dyspnea on exertion, leg swelling, orthopnea and palpitations.  Respiratory: Negative for cough, hemoptysis, shortness of breath and snoring.   Endocrine: Negative for heat intolerance and polyphagia.  Hematologic/Lymphatic: Negative for bleeding problem. Does not bruise/bleed easily.  Skin: Negative for flushing, nail changes, rash and suspicious lesions.  Musculoskeletal: Negative for arthritis, joint pain, muscle cramps, myalgias, neck pain and stiffness.  Gastrointestinal: Negative for abdominal pain, bowel incontinence, diarrhea and excessive appetite.  Genitourinary: Negative for decreased libido, genital sores and incomplete emptying.  Neurological: Negative for brief paralysis, focal weakness,  headaches and loss of balance.  Psychiatric/Behavioral: Negative for altered mental status, depression and suicidal ideas.  Allergic/Immunologic: Negative for HIV exposure and persistent infections.    EKGs/Labs/Other Studies Reviewed:    The following studies were reviewed today:   EKG:  None today   TTE 01/18/2021 Zio monitor  Predominant rhythm was atrial flutter with average heart rate 92bpm and ranged from 48 to 172bpm. No sinus rhythm was present. Occasional PVCs were present.   TTE 01/18/2021 IMPRESSIONS   1. The aortic valve is calcified. Aortic valve regurgitation is mild to moderate. Mild to moderate aortic valve sclerosis/calcification is present, without any evidence of aortic stenosis and stroke volume index  of 38.   2. Left ventricular ejection fraction, by estimation, is 60 to 65%. The  left ventricle has normal function. The left ventricle has no regional  wall motion abnormalities. Left ventricular diastolic parameters are  indeterminate due to atrial flutter.   3. Right ventricular systolic function is  normal. The right ventricular  size is normal. There is normal pulmonary artery systolic pressure. The  estimated right ventricular systolic pressure is 18.1 mmHg.   4. Left atrial size was mildly dilated.   5. The mitral valve is abnormal. Mild mitral valve regurgitation. The  mean mitral valve gradient is 2.0 mmHg with average heart rate of 103 bpm.   6. The inferior vena cava is normal in size with greater than 50%  respiratory variability, suggesting right atrial pressure of 3 mmHg.   Comparison(s): No prior Echocardiogram.   FINDINGS   Left Ventricle: Left ventricular ejection fraction, by estimation, is 60  to 65%. The left ventricle has normal function. The left ventricle has no  regional wall motion abnormalities. The left ventricular internal cavity  size was normal in size. There is   no left ventricular hypertrophy. Left ventricular diastolic parameters  are indeterminate.   Right Ventricle: The right ventricular size is normal. No increase in  right ventricular wall thickness. Right ventricular systolic function is  normal. There is normal pulmonary artery systolic pressure. The tricuspid  regurgitant velocity is 1.94 m/s, and   with an assumed right atrial pressure of 3 mmHg, the estimated right  ventricular systolic pressure is 18.1 mmHg.   Left Atrium: Left atrial size was mildly dilated.   Right Atrium: Right atrial size was normal in size.   Pericardium: There is no evidence of pericardial effusion.   Mitral Valve: The mitral valve is abnormal. Mild mitral valve  regurgitation. The mean mitral valve gradient is 2.0 mmHg with average  heart rate of 103 bpm.   Tricuspid Valve: The tricuspid valve is grossly normal. Tricuspid valve  regurgitation is mild . No evidence of tricuspid stenosis.   Aortic Valve: The aortic valve is calcified. Aortic valve regurgitation is  mild to moderate. Mild to moderate aortic valve sclerosis/calcification is  present, without any  evidence of aortic stenosis. Aortic valve mean  gradient measures 11.0 mmHg. Aortic  valve peak gradient measures 19.4 mmHg. Aortic valve area, by VTI measures  1.88 cm.   Pulmonic Valve: The pulmonic valve was grossly normal. Pulmonic valve  regurgitation is mild.   Aorta: The aortic root and ascending aorta are structurally normal, with  no evidence of dilitation.   Pulmonary Artery: The pulmonary artery is of normal size.   Venous: The inferior vena cava is normal in size with greater than 50%  respiratory variability, suggesting right atrial pressure of 3 mmHg.   IAS/Shunts: The atrial  septum is grossly normal.   Recent Labs: 05/03/2022: TSH 0.94 09/25/2022: ALT 22; BUN 21; Creatinine, Ser 0.86; Potassium 5.1; Sodium 138 10/25/2022: Hemoglobin 13.5; Platelets 293  Recent Lipid Panel    Component Value Date/Time   CHOL 196 05/03/2022 0908   TRIG 62.0 05/03/2022 0908   HDL 76.90 05/03/2022 0908   CHOLHDL 3 05/03/2022 0908   VLDL 12.4 05/03/2022 0908   LDLCALC 107 (H) 05/03/2022 0908    Physical Exam:    VS:  BP 118/62   Pulse 82   Ht 5' (1.524 m)   Wt 173 lb 4.8 oz (78.6 kg)   SpO2 100%   BMI 33.85 kg/m     Wt Readings from Last 3 Encounters:  01/01/23 173 lb 4.8 oz (78.6 kg)  11/30/22 173 lb (78.5 kg)  10/17/22 170 lb (77.1 kg)     GEN: Well nourished, well developed in no acute distress HEENT: Normal NECK: No JVD; No carotid bruits LYMPHATICS: No lymphadenopathy CARDIAC: S1S2 noted,RRR, no murmurs, rubs, gallops RESPIRATORY:  Clear to auscultation without rales, wheezing or rhonchi  ABDOMEN: Soft, non-tender, non-distended, +bowel sounds, no guarding. EXTREMITIES: No edema, No cyanosis, no clubbing MUSCULOSKELETAL:  No deformity  SKIN: Warm and dry NEUROLOGIC:  Alert and oriented x 3, non-focal PSYCHIATRIC:  Normal affect, good insight  ASSESSMENT:    1. Typical atrial flutter (HCC)   2. Nonrheumatic aortic valve insufficiency   3. Primary  hypertension   4. Long-term use of high-risk medication   5. OSA (obstructive sleep apnea)      PLAN:     Atrial flutter -she is status post ablation and has remained in sinus rhythm.    She has been off Eliquis and Cardizem.   OSA-unfortunately she has completely stopped taking the CPAP.  Fatigue she tells me has not reoccurred.  Discussed with the patient she will need to reestablish with our sleep team-will send a message to the team as well. Blood pressure is acceptable, continue with current antihypertensive regimen. Hyperlipidemia - continue with current statin medication. The patient understands the need to lose weight with diet and exercise. We have discussed specific strategies for this. In terms of her aortic regurgitation she currently does not appear to be volume overloaded no shortness of breath.  Plan to repeat her echocardiogram at her next visit.  The patient is in agreement with the above plan. The patient left the office in stable condition.  The patient will follow up in 9 months or sooner if needed.   Medication Adjustments/Labs and Tests Ordered: Current medicines are reviewed at length with the patient today.  Concerns regarding medicines are outlined above.  No orders of the defined types were placed in this encounter.  No orders of the defined types were placed in this encounter.   Patient Instructions  Medication Instructions:  Your physician recommends that you continue on your current medications as directed. Please refer to the Current Medication list given to you today.  *If you need a refill on your cardiac medications before your next appointment, please call your pharmacy*   Lab Work: None   Testing/Procedures: None   Follow-Up: At [facility], you and your health needs are our priority.  As part of our continuing mission to provide you with exceptional heart care, we have created designated Provider Care Teams.  These Care Teams  include your primary Cardiologist (physician) and Advanced Practice Providers (APPs -  Physician Assistants and Nurse Practitioners) who all work together to  provide you with the care you need, when you need it.   Your next appointment:   9 month(s)  Provider:   Thomasene Ripple, DO     Adopting a Healthy Lifestyle.  Know what a healthy weight is for you (roughly BMI <25) and aim to maintain this   Aim for 7+ servings of fruits and vegetables daily   65-80+ fluid ounces of water or unsweet tea for healthy kidneys   Limit to max 1 drink of alcohol per day; avoid smoking/tobacco   Limit animal fats in diet for cholesterol and heart health - choose grass fed whenever available   Avoid highly processed foods, and foods high in saturated/trans fats   Aim for low stress - take time to unwind and care for your mental health   Aim for 150 min of moderate intensity exercise weekly for heart health, and weights twice weekly for bone health   Aim for 7-9 hours of sleep daily   When it comes to diets, agreement about the perfect plan isnt easy to find, even among the experts. Experts at the [facility] [employer] developed an idea known as the Healthy Eating Plate. Just imagine a plate divided into logical, healthy portions.   The emphasis is on diet quality:   Load up on vegetables and fruits - one-half of your plate: Aim for color and variety, and remember that potatoes dont count.   Go for whole grains - one-quarter of your plate: Whole wheat, barley, wheat berries, quinoa, oats, brown rice, and foods made with them. If you want pasta, go with whole wheat pasta.   Protein power - one-quarter of your plate: Fish, chicken, beans, and nuts are all healthy, versatile protein sources. Limit red meat.   The diet, however, does go beyond the plate, offering a few other suggestions.   Use healthy plant oils, such as olive, canola, soy, corn, sunflower and peanut. Check the labels, and  avoid partially hydrogenated oil, which have unhealthy trans fats.   If youre thirsty, drink water. Coffee and tea are good in moderation, but skip sugary drinks and limit milk and dairy products to one or two daily servings.   The type of carbohydrate in the diet is more important than the amount. Some sources of carbohydrates, such as vegetables, fruits, whole grains, and beans-are healthier than others.   Finally, stay active  Signed, Thomasene Ripple, DO  01/01/2023 11:45 AM    [facility]

## 2023-01-09 ENCOUNTER — Encounter: Payer: Self-pay | Admitting: Cardiology

## 2023-01-09 NOTE — Telephone Encounter (Signed)
Message sent to pt to clarify what we may need. Also routed conversation to sleep coordinator.

## 2023-01-31 ENCOUNTER — Encounter: Payer: Self-pay | Admitting: Nurse Practitioner

## 2023-02-07 ENCOUNTER — Telehealth: Payer: Self-pay | Admitting: *Deleted

## 2023-02-07 DIAGNOSIS — G4733 Obstructive sleep apnea (adult) (pediatric): Secondary | ICD-10-CM

## 2023-02-07 NOTE — Telephone Encounter (Signed)
Sleep lab called to schedule and lmtcb

## 2023-02-07 NOTE — Telephone Encounter (Signed)
Please get a 2 week mask desensitization through   Sleep lab.

## 2023-02-07 NOTE — Telephone Encounter (Signed)
Reached out to the patient and she states she is not interested in continuing with her cpap. She has not used her cpap in 1 year.

## 2023-02-28 NOTE — Progress Notes (Unsigned)
   Rubin Payor, PhD, LAT, ATC acting as a scribe for [name], MD.  Martha Gilmore is a 82 y.o. female who presents to [facility] at [facility] today for f/u R knee pain. Pt was last seen by Dr. Denyse Amass on 11/30/22 and was given a R knee steroid injection.   Today, pt reports ***  Dx imaging: 04/05/22 R knee XR   Pertinent review of systems: ***  Relevant historical information: ***   Exam:  There were no vitals taken for this visit. General: Well Developed, well nourished, and in no acute distress.   MSK: ***    Lab and Radiology Results No results found for this or any previous visit (from the past 72 hour(s)). No results found.     Assessment and Plan: 82 y.o. female with ***   PDMP not reviewed this encounter. No orders of the defined types were placed in this encounter.  No orders of the defined types were placed in this encounter.    Discussed warning signs or symptoms. Please see discharge instructions. Patient expresses understanding.   ***

## 2023-02-28 NOTE — Telephone Encounter (Signed)
Per our conversation today I cancelled your appointment with Dr Tresa Endo and I understand you will see your PCP on 05/07/23 to discuss other options and providers for your sleep therapy.

## 2023-03-01 ENCOUNTER — Other Ambulatory Visit: Payer: Self-pay

## 2023-03-01 ENCOUNTER — Encounter: Payer: Self-pay | Admitting: Family Medicine

## 2023-03-01 ENCOUNTER — Ambulatory Visit: Payer: Medicare Other | Admitting: Family Medicine

## 2023-03-01 VITALS — BP 120/70 | HR 66 | Ht 60.0 in | Wt 172.0 lb

## 2023-03-01 DIAGNOSIS — G8929 Other chronic pain: Secondary | ICD-10-CM | POA: Diagnosis not present

## 2023-03-01 DIAGNOSIS — M1711 Unilateral primary osteoarthritis, right knee: Secondary | ICD-10-CM

## 2023-03-01 DIAGNOSIS — M25561 Pain in right knee: Secondary | ICD-10-CM

## 2023-03-01 MED ORDER — TRIAMCINOLONE ACETONIDE 32 MG IX SRER
32.0000 mg | Freq: Once | INTRA_ARTICULAR | Status: AC
Start: 2023-03-01 — End: 2023-03-01
  Administered 2023-03-01: 32 mg via INTRA_ARTICULAR

## 2023-03-01 NOTE — Patient Instructions (Addendum)
Thank you for coming in today.  You received an injection today. Seek immediate medical attention if the joint becomes red, extremely painful, or is oozing fluid.  Recheck as needed.   I can repeat this injection in 3 months but we will need to get it reauthorized before.

## 2023-03-07 NOTE — Telephone Encounter (Signed)
Pt received Zilretta inj for RIGHT knee OA on 03/01/23.  Can consider repeat inj on or after 05/25/23

## 2023-03-19 ENCOUNTER — Telehealth: Payer: Self-pay | Admitting: Family Medicine

## 2023-03-19 NOTE — Telephone Encounter (Signed)
Patient called stating that her knee pain has returned after the Zilretta injection. She asked if she could try the [place]? And if so, how long would she have to wait?

## 2023-03-20 NOTE — Telephone Encounter (Signed)
Not due for Zilretta inj until 05/25/23.   We are good to go for GELSYN  GELSYN for RIGHT knee OA (Also checking Zilretta)   Primary Insurance: [facility] Medicare  Co-pay: $30 Co-insurance: 0% Deductible: does not apply Prior Auth: NOT required     Knee Injection History: 04/05/22 Kenalog RIGHT 09/19/22 Kenalog RIGHT 11/30/22 Kenalog RIGHT

## 2023-03-20 NOTE — Telephone Encounter (Signed)
Scheduled

## 2023-03-21 ENCOUNTER — Other Ambulatory Visit: Payer: Self-pay

## 2023-03-21 ENCOUNTER — Encounter: Payer: Self-pay | Admitting: Family Medicine

## 2023-03-21 ENCOUNTER — Ambulatory Visit: Payer: Medicare Other | Admitting: Family Medicine

## 2023-03-21 VITALS — BP 120/80 | HR 79 | Ht 60.0 in | Wt 171.0 lb

## 2023-03-21 DIAGNOSIS — M25561 Pain in right knee: Secondary | ICD-10-CM

## 2023-03-21 DIAGNOSIS — G8929 Other chronic pain: Secondary | ICD-10-CM | POA: Diagnosis not present

## 2023-03-21 DIAGNOSIS — M1711 Unilateral primary osteoarthritis, right knee: Secondary | ICD-10-CM | POA: Diagnosis not present

## 2023-03-21 MED ORDER — SODIUM HYALURONATE (VISCOSUP) 16.8 MG/2ML IX SOSY
16.8000 mg | PREFILLED_SYRINGE | Freq: Once | INTRA_ARTICULAR | Status: AC
Start: 2023-03-21 — End: 2023-03-21
  Administered 2023-03-21: 16.8 mg via INTRA_ARTICULAR

## 2023-03-21 NOTE — Patient Instructions (Signed)
Thank you for coming in today.   You received an injection today. Seek immediate medical attention if the joint becomes red, extremely painful, or is oozing fluid.   Schedule the 2nd injection for next week and the 3rd for the week after that.  Have a wonderful Thanksgiving!!

## 2023-03-21 NOTE — Progress Notes (Signed)
   I, Stevenson Clinch, CMA acting as a scribe for Martha Graham, MD.  Martha Gilmore is a 82 y.o. female who presents to [facility] today for cont'd R knee pain. Pt was last seen by Dr. Denyse Amass on 03/01/23 and was given a R knee Zilretta injection.  Today, pt reports minimal improvement with Zilretta injection. Denies visible swelling around the right knee. The knee feels unsteady when ascending stairs and when standing on one leg to dress. Gelsyn injection today.   Dx imaging: 04/05/22 R knee XR   Pertinent review of systems: No fevers or chills  Relevant historical information: Atrial flutter.   Exam:  BP 120/80   Pulse 79   Ht 5' (1.524 m)   Wt 171 lb (77.6 kg)   SpO2 97%   BMI 33.40 kg/m  General: Well Developed, well nourished, and in no acute distress.   MSK: Right knee mild effusion normal motion with crepitation.  Using a cane to ambulate.    Lab and Radiology Results  Martha Gilmore presents to clinic today for Gelsyn injection right knee 1/3 Procedure: Real-time Ultrasound Guided Injection of right knee joint superior lateral patellar space Device: Philips Affiniti 50G/GE Logiq Images permanently stored and available for review in PACS Verbal informed consent obtained.  Discussed risks and benefits of procedure. Warned about infection, bleeding, damage to structures among others. Patient expresses understanding and agreement Time-out conducted.   Noted no overlying erythema, induration, or other signs of local infection.   Skin prepped in a sterile fashion.   Local anesthesia: Topical Ethyl chloride.   With sterile technique and under real time ultrasound guidance: Gelsyn 2 mL injected into knee joint. Fluid seen entering the joint capsule.   Completed without difficulty   Advised to call if fevers/chills, erythema, induration, drainage, or persistent bleeding.   Images permanently stored and available for review in the ultrasound  unit.  Impression: Technically successful ultrasound guided injection. Lot number: [id number]       Assessment and Plan: 82 y.o. female with right knee pain due to osteoarthritis.  Unfortunately she did not get much benefit from [place] injection on November 7.  Plan for Gelsyn series starting today.  Return in 1 week for Gelsyn injection right knee 2/3.  If not improving would consider referral to orthopedic surgery to discuss total knee replacement.   PDMP not reviewed this encounter. Orders Placed This Encounter  Procedures   [place] LIMITED JOINT SPACE STRUCTURES LOW RIGHT(NO LINKED CHARGES)    Order Specific Question:   Reason for Exam (SYMPTOM  OR DIAGNOSIS REQUIRED)    Answer:   right knee pain    Order Specific Question:   Preferred imaging location?    Answer:   Adult nurse [facility] ordered this encounter  Medications   sodium hyaluronate (viscosup) (GELSYN-3) intra-articular injection 16.8 mg     Discussed warning signs or symptoms. Please see discharge instructions. Patient expresses understanding.   The above documentation has been reviewed and is accurate and complete Martha Gilmore, M.D.

## 2023-03-28 ENCOUNTER — Other Ambulatory Visit: Payer: Self-pay

## 2023-03-28 ENCOUNTER — Other Ambulatory Visit: Payer: Medicare Other

## 2023-03-28 ENCOUNTER — Ambulatory Visit: Payer: Medicare Other | Admitting: Family Medicine

## 2023-03-28 DIAGNOSIS — E559 Vitamin D deficiency, unspecified: Secondary | ICD-10-CM

## 2023-03-28 DIAGNOSIS — M1711 Unilateral primary osteoarthritis, right knee: Secondary | ICD-10-CM

## 2023-03-28 MED ORDER — SODIUM HYALURONATE (VISCOSUP) 16.8 MG/2ML IX SOSY
16.8000 mg | PREFILLED_SYRINGE | Freq: Once | INTRA_ARTICULAR | Status: AC
Start: 2023-03-28 — End: 2023-03-28
  Administered 2023-03-28: 16.8 mg via INTRA_ARTICULAR

## 2023-03-28 NOTE — Progress Notes (Signed)
Martha Gilmore [place] presents to clinic today for Gelsyn injection right knee 2/3 Procedure: Real-time Ultrasound Guided Injection of right knee joint superior lateral patellar space Device: Philips Affiniti 50G/GE Logiq Images permanently stored and available for review in PACS Verbal informed consent obtained.  Discussed risks and benefits of procedure. Warned about infection, bleeding, damage to structures among others. Patient expresses understanding and agreement Time-out conducted.   Noted no overlying erythema, induration, or other signs of local infection.   Skin prepped in a sterile fashion.   Local anesthesia: Topical Ethyl chloride.   With sterile technique and under real time ultrasound guidance: Gelsyn 2 mL injected into knee joint. Fluid seen entering the joint capsule.   Completed without difficulty   Advised to call if fevers/chills, erythema, induration, drainage, or persistent bleeding.   Images permanently stored and available for review in the ultrasound unit.  Impression: Technically successful ultrasound guided injection. Lot number: [id number] Return in 1 week for Gelsyn injection right knee 3/3

## 2023-03-28 NOTE — Patient Instructions (Signed)
Thank you for coming in today.   You received an injection today. Seek immediate medical attention if the joint becomes red, extremely painful, or is oozing fluid.  

## 2023-03-29 LAB — VITAMIN D 25 HYDROXY (VIT D DEFICIENCY, FRACTURES): Vit D, 25-Hydroxy: 71.5 ng/mL (ref 30.0–100.0)

## 2023-04-04 ENCOUNTER — Other Ambulatory Visit: Payer: Self-pay

## 2023-04-04 ENCOUNTER — Ambulatory Visit: Payer: Medicare Other | Admitting: Family Medicine

## 2023-04-04 DIAGNOSIS — M1711 Unilateral primary osteoarthritis, right knee: Secondary | ICD-10-CM

## 2023-04-04 MED ORDER — SODIUM HYALURONATE (VISCOSUP) 16.8 MG/2ML IX SOSY
16.8000 mg | PREFILLED_SYRINGE | Freq: Once | INTRA_ARTICULAR | Status: AC
Start: 2023-04-04 — End: 2023-04-04
  Administered 2023-04-04: 16.8 mg via INTRA_ARTICULAR

## 2023-04-04 NOTE — Patient Instructions (Addendum)
Thank you for coming in today.  You received an injection today. Seek immediate medical attention if the joint becomes red, extremely painful, or is oozing fluid.  Let me know how you are doing. If not good enough I would recommend surgery consultation.

## 2023-04-04 NOTE — Progress Notes (Signed)
[name] presents to clinic today for Gelsyn injection right knee 3/3 Procedure: Real-time Ultrasound Guided Injection of right knee joint superior lateral patellar space Device: Philips Affiniti 50G/GE Logiq Images permanently stored and available for review in PACS Verbal informed consent obtained.  Discussed risks and benefits of procedure. Warned about infection, bleeding, damage to structures among others. Patient expresses understanding and agreement Time-out conducted.   Noted no overlying erythema, induration, or other signs of local infection.   Skin prepped in a sterile fashion.   Local anesthesia: Topical Ethyl chloride.   With sterile technique and under real time ultrasound guidance: Gelsyn 2 mL injected into knee joint. Fluid seen entering the joint capsule.   Completed without difficulty   Advised to call if fevers/chills, erythema, induration, drainage, or persistent bleeding.   Images permanently stored and available for review in the ultrasound unit.  Impression: Technically successful ultrasound guided injection. Lot number: [id number] This completes her [place]k [place] series.  She had a Zilretta injection November 7.  If not better with gel and Zilretta injections would recommend surgery consultation.  Recommended Dr. Magnus Ivan Dr. Roda Shutters and Dr. August Saucer

## 2023-04-26 NOTE — Telephone Encounter (Signed)
 VOB initiated for 2025.

## 2023-04-29 ENCOUNTER — Encounter: Payer: Self-pay | Admitting: Family Medicine

## 2023-04-30 ENCOUNTER — Other Ambulatory Visit: Payer: Self-pay

## 2023-04-30 DIAGNOSIS — M1711 Unilateral primary osteoarthritis, right knee: Secondary | ICD-10-CM

## 2023-05-02 NOTE — Telephone Encounter (Signed)
 GELSYN for RIGHT knee OA   Primary Insurance: [employer] PPO Co-pay: $30 Co-insurance: 10% Deductible: does not apply Prior Auth: NOT required   Knee Injection History 04/05/22 - Kenalog  RIGHT 09/19/22 - Kenalog  RIGHT 11/30/22 - Kenalog  RIGHT 03/01/23 - Zilretta  RIGHT 03/21/23 - Gelsyn #1 RIGHT 03/28/23 - Gelsyn #2 RIGHT 04/04/23 - Gelsyn #3 RIGHT  Can consider repeat injections on or after 10/04/23

## 2023-05-02 NOTE — Telephone Encounter (Signed)
 [employer] - Prior Authorization NOT required

## 2023-05-04 NOTE — Telephone Encounter (Signed)
VOB initiated for ZILRETTA for RIGHT knee OA.

## 2023-05-07 ENCOUNTER — Ambulatory Visit: Payer: Medicare Other | Admitting: Nurse Practitioner

## 2023-05-07 ENCOUNTER — Encounter: Payer: Self-pay | Admitting: Nurse Practitioner

## 2023-05-07 VITALS — BP 122/74 | HR 62 | Ht 61.0 in | Wt 172.4 lb

## 2023-05-07 DIAGNOSIS — R7303 Prediabetes: Secondary | ICD-10-CM

## 2023-05-07 DIAGNOSIS — M25569 Pain in unspecified knee: Secondary | ICD-10-CM

## 2023-05-07 DIAGNOSIS — I10 Essential (primary) hypertension: Secondary | ICD-10-CM

## 2023-05-07 DIAGNOSIS — E039 Hypothyroidism, unspecified: Secondary | ICD-10-CM

## 2023-05-07 DIAGNOSIS — M45A1 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: Secondary | ICD-10-CM

## 2023-05-07 DIAGNOSIS — G4733 Obstructive sleep apnea (adult) (pediatric): Secondary | ICD-10-CM

## 2023-05-07 DIAGNOSIS — F339 Major depressive disorder, recurrent, unspecified: Secondary | ICD-10-CM

## 2023-05-07 DIAGNOSIS — I483 Typical atrial flutter: Secondary | ICD-10-CM | POA: Diagnosis not present

## 2023-05-07 DIAGNOSIS — Z0000 Encounter for general adult medical examination without abnormal findings: Secondary | ICD-10-CM | POA: Diagnosis not present

## 2023-05-07 DIAGNOSIS — E559 Vitamin D deficiency, unspecified: Secondary | ICD-10-CM

## 2023-05-07 DIAGNOSIS — G8929 Other chronic pain: Secondary | ICD-10-CM

## 2023-05-07 DIAGNOSIS — M1711 Unilateral primary osteoarthritis, right knee: Secondary | ICD-10-CM

## 2023-05-07 NOTE — Progress Notes (Signed)
 Catheline Doing, DNP, AGNP-c [facility] [address] [place], [place] [address] Main Office [phone]  BP 122/74   Pulse 62   Ht 5' 1 (1.549 m)   Wt 172 lb 6.4 oz (78.2 kg)   BMI 32.57 kg/m    Subjective:    Patient ID: Martha Gilmore, female    DOB: June 21, 1940, 83 y.o.   MRN: [id number]  HPI: Kora Paull is a 83 y.o. female presenting on 05/07/2023 for comprehensive medical examination.   The patient, with a history of endometriosis, atrial fibrillation (AFib), and sleep apnea, presents for a routine physical examination. She reports significant improvement in neck discomfort after discontinuing certain pain medications and starting vitamin D  supplementation. The patient describes feeling like a new person and attributes this change to the vitamin D  supplementation.  The patient also reports a history of sleep apnea and has been using a CPAP machine. However, she expresses dissatisfaction with the support received for the machine's use and has stopped using it due to discomfort and feelings of claustrophobia. Despite this, she reports sleeping through the night without waking up gasping for air.  The patient also mentions a history of AFib, for which she underwent an ablation procedure. She is unsure if she still experiences intermittent AFib episodes, as she does not report any symptoms.  Regarding her musculoskeletal health, the patient reports a significant improvement in neck pain after discontinuing certain pain medications. She also mentions a history of C. difficile infection, which has resulted in a change in bowel habits from constipation to looser stools. However, she does not consider this a problem as it is manageable and preferable to constipation.  The patient is also dealing with knee issues and is awaiting an appointment with an orthopedic surgeon for further evaluation and potential intervention. She expresses eagerness to have her knee issue  resolved, anticipating that it will significantly improve her overall well-being.  In terms of lifestyle and diet, the patient reports a long history of dieting without significant success in weight loss. She expresses frustration with this and is seeking ways to manage her weight more effectively. She also mentions a history of elevated blood sugar levels, which may be contributing to her difficulty in losing weight.  Pertinent items are noted in HPI.   Most Recent Depression Screen:     05/07/2023    9:39 AM 09/25/2022    2:43 PM 07/12/2022    3:14 PM 06/28/2022    2:34 PM 06/22/2022    8:51 AM  Depression screen PHQ 2/9  Decreased Interest 0 0 0 0 0  Down, Depressed, Hopeless 0 0 0 0 0  PHQ - 2 Score 0 0 0 0 0   Most Recent Anxiety Screen:     07/12/2022    3:14 PM 06/28/2022    2:35 PM  GAD 7 : Generalized Anxiety Score  Nervous, Anxious, on Edge 0 0  Control/stop worrying 0 0  Worry too much - different things 0 0  Trouble relaxing 0 0  Restless 0 0  Easily annoyed or irritable 0 0  Afraid - awful might happen 0 0  Total GAD 7 Score 0 0  Anxiety Difficulty Not difficult at all Not difficult at all   Most Recent Fall Screen:    05/07/2023    9:39 AM 09/25/2022    2:41 PM 07/12/2022    3:14 PM 06/28/2022    2:34 PM 06/21/2022   11:41 PM  Fall Risk   Falls in the  past year? 1 1 0 0 0  Number falls in past yr: 0 0 0 0 0  Injury with Fall? 0 0 0 0 0  Risk for fall due to : Other (Comment) Other (Comment) No Fall Risks No Fall Risks Impaired vision  Risk for fall due to: Comment tripped on garden hose missed the chair     Follow up Falls evaluation completed Falls evaluation completed Falls evaluation completed Falls evaluation completed Falls prevention discussed    Past medical history, surgical history, medications, allergies, family history and social history reviewed with patient today and changes made to appropriate areas of the chart.  Past Medical History:  Past Medical  History:  Diagnosis Date   Allergy    Aortic insufficiency    mild to moderate by echo 12/2020   Arthritis    Atrial flutter (HCC) 12/30/2020   Cataracts, bilateral 09/23/2015   Clostridium difficile infection 04/16/2017   H/o C.diff due to clindamicyin 09/2016; two rounds of treatment for cure. Dr. Rollin   Depression    Encounter for annual physical exam 05/09/2023   GERD (gastroesophageal reflux disease)    Heart murmur    Hyperlipidemia    OAB (overactive bladder) 12/16/2013   Osteopenia 08/05/2012   Dexa scan 07/2012 mild osteopenia, T = -1.1   Other fatigue 09/25/2022   Seasonal allergies    Sleep apnea    Thyroid  disease    Urge and stress incontinence    Medications:  Current Outpatient Medications on File Prior to Visit  Medication Sig   acetaminophen  (TYLENOL ) 500 MG tablet Take 500 mg by mouth every 6 (six) hours as needed.   atorvastatin  (LIPITOR) 10 MG tablet TAKE 1 TABLET BY MOUTH EVERYDAY AT BEDTIME   Cholecalciferol (VITAMIN D3) 1.25 MG (50000 UT) TABS Take 1 tablet by mouth once a week.   citalopram  (CELEXA ) 20 MG tablet TAKE 1 TABLET BY MOUTH EVERY DAY   levothyroxine  (SYNTHROID ) 100 MCG tablet Take 1 tablet (100 mcg total) by mouth daily before breakfast. Mon- Sat 100 mcg daily, and Sun 150 mcg daily (Patient taking differently: Take 100 mcg by mouth daily before breakfast.)   Multiple Vitamins-Minerals (PRESERVISION AREDS PO) Take 1 capsule by mouth in the morning and at bedtime.   omeprazole  (PRILOSEC) 20 MG capsule TAKE 1 CAPSULE BY MOUTH EVERY DAY   oxybutynin  (DITROPAN -XL) 10 MG 24 hr tablet TAKE ONE TABLET BY MOUTH AT BEDTIME   Polyethyl Glycol-Propyl Glycol (SYSTANE OP) Place 1 drop into both eyes daily as needed (DRY EYES).   No current facility-administered medications on file prior to visit.   Surgical History:  Past Surgical History:  Procedure Laterality Date   A-FLUTTER ABLATION N/A 10/14/2021   Procedure: A-FLUTTER ABLATION;  Surgeon: Inocencio Soyla Lunger, MD;  Location: [facility];  Service: Cardiovascular;  Laterality: N/A;   APPENDECTOMY  1971   CARDIOVERSION N/A 04/13/2021   Procedure: CARDIOVERSION;  Surgeon: Santo Stanly LABOR, MD;  Location: [facility] ENDOSCOPY;  Service: Cardiovascular;  Laterality: N/A;   CESAREAN SECTION     CHOLECYSTECTOMY     EYE SURGERY  cataracts   Teeth Implants     TRIGGER FINGER RELEASE     VAGINAL HYSTERECTOMY     Allergies:  Allergies  Allergen Reactions   Aspirin Itching   Demerol  [Meperidine Hcl] Other (See Comments)    hallucinations   Penicillins Other (See Comments) and Rash    Other Reaction: RASH & HIVES    Statins Itching  Cephalexin Other (See Comments) and Rash    Other Reaction: RASH & ITCHING    Clindamycin/Lincomycin Other (See Comments) and Rash    Other Reaction: RASH & ITCHING    Diclofenac  Other (See Comments)   Tape Rash    Paper tape is OK    Family History:  Family History  Problem Relation Age of Onset   Heart disease Mother    Hypertension Mother    Arthritis Mother    Miscarriages / Stillbirths Mother    Obesity Mother    Cancer Father    Heart disease Father    Obesity Father    Lung cancer Sister    Heart disease Sister    Diabetes Sister    Cancer Sister    [place] death Sister    Obesity Sister    Kidney cancer Sister    Brain cancer Brother    Heart disease Maternal Grandmother    Cancer Daughter    Arthritis Sister    Cancer Sister        Objective:    BP 122/74   Pulse 62   Ht 5' 1 (1.549 m)   Wt 172 lb 6.4 oz (78.2 kg)   BMI 32.57 kg/m   Wt Readings from Last 3 Encounters:  05/07/23 172 lb 6.4 oz (78.2 kg)  03/21/23 171 lb (77.6 kg)  03/01/23 172 lb (78 kg)    Physical Exam Vitals and nursing note reviewed.  Constitutional:      General: She is not in acute distress.    Appearance: Normal appearance.  HENT:     Head: Normocephalic and atraumatic.     Right Ear: Hearing, tympanic membrane, ear canal and  external ear normal.     Left Ear: Hearing, tympanic membrane, ear canal and external ear normal.     Nose: Nose normal.     Right Sinus: No maxillary sinus tenderness or frontal sinus tenderness.     Left Sinus: No maxillary sinus tenderness or frontal sinus tenderness.     Mouth/Throat:     Lips: Pink.     Mouth: Mucous membranes are moist.     Pharynx: Oropharynx is clear.  Eyes:     General: Lids are normal. Vision grossly intact.     Extraocular Movements: Extraocular movements intact.     Conjunctiva/sclera: Conjunctivae normal.     Pupils: Pupils are equal, round, and reactive to light.     Funduscopic exam:    Right eye: Red reflex present.        Left eye: Red reflex present.    Visual Fields: Right eye visual fields normal and left eye visual fields normal.  Neck:     Thyroid : No thyromegaly.     Vascular: No carotid bruit.  Cardiovascular:     Rate and Rhythm: Normal rate and regular rhythm.     Chest Wall: PMI is not displaced.     Pulses: Normal pulses.          Dorsalis pedis pulses are 2+ on the right side and 2+ on the left side.       Posterior tibial pulses are 2+ on the right side and 2+ on the left side.     Heart sounds: Normal heart sounds. No murmur heard. Pulmonary:     Effort: Pulmonary effort is normal. No respiratory distress.     Breath sounds: Normal breath sounds.  Abdominal:     General: Abdomen is flat. Bowel sounds are normal. There is  no distension.     Palpations: Abdomen is soft. There is no hepatomegaly, splenomegaly or mass.     Tenderness: There is no abdominal tenderness. There is no right CVA tenderness, left CVA tenderness, guarding or rebound.  Musculoskeletal:        General: Normal range of motion.     Cervical back: Full passive range of motion without pain, normal range of motion and neck supple. No tenderness.     Right lower leg: No edema.     Left lower leg: No edema.  Feet:     Left foot:     Toenail Condition: Left toenails  are normal.  Lymphadenopathy:     Cervical: No cervical adenopathy.     Upper Body:     Right upper body: No supraclavicular adenopathy.     Left upper body: No supraclavicular adenopathy.  Skin:    General: Skin is warm and dry.     Capillary Refill: Capillary refill takes less than 2 seconds.     Nails: There is no clubbing.  Neurological:     General: No focal deficit present.     Mental Status: She is alert and oriented to person, place, and time.     GCS: GCS eye subscore is 4. GCS verbal subscore is 5. GCS motor subscore is 6.     Sensory: Sensation is intact.     Motor: Motor function is intact.     Coordination: Coordination is intact.     Gait: Gait is intact.     Deep Tendon Reflexes: Reflexes are normal and symmetric.  Psychiatric:        Attention and Perception: Attention normal.        Mood and Affect: Mood normal.        Speech: Speech normal.        Behavior: Behavior normal. Behavior is cooperative.        Thought Content: Thought content normal.        Cognition and Memory: Cognition and memory normal.        Judgment: Judgment normal.      Results for orders placed or performed in visit on 05/07/23  Hemoglobin A1c   Collection Time: 05/08/23  9:20 AM  Result Value Ref Range   Hgb A1c MFr Bld 5.5 4.8 - 5.6 %   Est. average glucose Bld gHb Est-mCnc 111 mg/dL  CBC with Differential/Platelet   Collection Time: 05/08/23  9:20 AM  Result Value Ref Range   WBC 6.0 3.4 - 10.8 x10E3/uL   RBC 4.56 3.77 - 5.28 x10E6/uL   Hemoglobin 13.1 11.1 - 15.9 g/dL   Hematocrit 58.4 65.9 - 46.6 %   MCV 91 79 - 97 fL   MCH 28.7 26.6 - 33.0 pg   MCHC 31.6 31.5 - 35.7 g/dL   RDW 86.7 88.2 - 84.5 %   Platelets 275 150 - 450 x10E3/uL   Neutrophils 68 Not Estab. %   Lymphs 21 Not Estab. %   Monocytes 8 Not Estab. %   Eos 2 Not Estab. %   Basos 1 Not Estab. %   Neutrophils Absolute 4.1 1.4 - 7.0 x10E3/uL   Lymphocytes Absolute 1.2 0.7 - 3.1 x10E3/uL   Monocytes Absolute  0.5 0.1 - 0.9 x10E3/uL   EOS (ABSOLUTE) 0.1 0.0 - 0.4 x10E3/uL   Basophils Absolute 0.0 0.0 - 0.2 x10E3/uL   Immature Granulocytes 0 Not Estab. %   Immature Grans (Abs) 0.0 0.0 - 0.1 x10E3/uL  Comprehensive  metabolic panel   Collection Time: 05/08/23  9:20 AM  Result Value Ref Range   Glucose 71 70 - 99 mg/dL   BUN 14 8 - 27 mg/dL   Creatinine, Ser 9.20 0.57 - 1.00 mg/dL   eGFR 75 >40 fO/fpw/8.26   BUN/Creatinine Ratio 18 12 - 28   Sodium 142 134 - 144 mmol/L   Potassium 4.5 3.5 - 5.2 mmol/L   Chloride 103 96 - 106 mmol/L   CO2 25 20 - 29 mmol/L   Calcium  9.7 8.7 - 10.3 mg/dL   Total Protein 6.4 6.0 - 8.5 g/dL   Albumin 4.0 3.7 - 4.7 g/dL   Globulin, Total 2.4 1.5 - 4.5 g/dL   Bilirubin Total 0.6 0.0 - 1.2 mg/dL   Alkaline Phosphatase 143 (H) 44 - 121 IU/L   AST 16 0 - 40 IU/L   ALT 11 0 - 32 IU/L  Iron, TIBC and Ferritin Panel   Collection Time: 05/08/23  9:20 AM  Result Value Ref Range   Total Iron Binding Capacity 232 (L) 250 - 450 ug/dL   UIBC 829 881 - 630 ug/dL   Iron 62 27 - 860 ug/dL   Iron Saturation 27 15 - 55 %   Ferritin 148 15 - 150 ng/mL  Lipid panel   Collection Time: 05/08/23  9:20 AM  Result Value Ref Range   Cholesterol, Total 194 100 - 199 mg/dL   Triglycerides 75 0 - 149 mg/dL   HDL 75 >60 mg/dL   VLDL Cholesterol Cal 14 5 - 40 mg/dL   LDL Chol Calc (NIH) 894 (H) 0 - 99 mg/dL   Chol/HDL Ratio 2.6 0.0 - 4.4 ratio       Assessment & Plan:   Problem List Items Addressed This Visit     Tricompartment osteoarthritis of right knee   Awaiting appointment with orthopedic surgeon Dr. Yvone for left knee pain. Previous treatments, including injections, were unsuccessful. Discussed referral and expected timeline for appointment. - Follow up with orthopedic referral to Dr. Yvone - Monitor for updates on appointment scheduling      Atrial flutter [facility])   Post-ablation, currently asymptomatic. Cardiology recommends continued CPAP use to prevent  potential AFib triggers from sleep apnea. Discussed the importance of managing sleep apnea to prevent AFib recurrence. - Monitor for any recurrence of AFib/aflutter symptoms      Relevant Orders   Hemoglobin A1c (Completed)   CBC with Differential/Platelet (Completed)   Comprehensive metabolic panel (Completed)   Iron, TIBC and Ferritin Panel (Completed)   Lipid panel (Completed)   Obstructive sleep apnea   Reports difficulty with CPAP usage due to discomfort and claustrophobia. Cardiology recommends continued use to prevent potential AFib triggers. Discussed referral to a pulmonology sleep specialist for better management and fitting of the CPAP mask. Patient will decide later. - Consider referral to pulmonology sleep specialist if patient decides to pursue further management      Encounter for annual physical exam - Primary   Relevant Orders   Hemoglobin A1c (Completed)   CBC with Differential/Platelet (Completed)   Comprehensive metabolic panel (Completed)   Iron, TIBC and Ferritin Panel (Completed)   Lipid panel (Completed)   Prediabetes   Slightly elevated blood sugar in June. Discussed insulin resistance, its role in weight management, and the importance of balancing carbohydrates and protein intake. Provided educational materials on maintaining weight and blood sugar levels. - Provide educational materials on maintaining weight and blood sugar levels - Encourage balanced diet with  protein and carbohydrates - Monitor blood sugar levels periodically       Relevant Orders   Hemoglobin A1c (Completed)   CBC with Differential/Platelet (Completed)   Comprehensive metabolic panel (Completed)   Iron, TIBC and Ferritin Panel (Completed)   Lipid panel (Completed)   Acquired hypothyroidism   Relevant Orders   Hemoglobin A1c (Completed)   CBC with Differential/Platelet (Completed)   Comprehensive metabolic panel (Completed)   Iron, TIBC and Ferritin Panel (Completed)   Lipid  panel (Completed)   Vitamin D  deficiency   Chronic knee pain   Major depression, recurrent, chronic (HCC)   Relevant Orders   Hemoglobin A1c (Completed)   CBC with Differential/Platelet (Completed)   Comprehensive metabolic panel (Completed)   Iron, TIBC and Ferritin Panel (Completed)   Lipid panel (Completed)   Hypertension   Relevant Orders   Hemoglobin A1c (Completed)   CBC with Differential/Platelet (Completed)   Comprehensive metabolic panel (Completed)   Iron, TIBC and Ferritin Panel (Completed)   Lipid panel (Completed)   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 - tomorrow AM labs.  NEXT PREVENTATIVE PHYSICAL DUE IN 1 YEAR.  PATIENT COUNSELING PROVIDED FOR ALL ADULT PATIENTS: A well balanced diet low in saturated fats, cholesterol, and moderation in carbohydrates.  This can be as simple as monitoring portion sizes and cutting back on sugary beverages such as soda and juice to start with.    Daily water consumption of at least 64 ounces.  Physical activity at least 180 minutes per week.  If just starting out, start 10 minutes a day and work your way up.   This can be as simple as taking the stairs instead of the elevator and walking 2-3 laps around the office  purposefully every day.   STD protection, partner selection, and regular testing if high risk.  Limited consumption of alcoholic beverages if alcohol is consumed. For men, I recommend no more than 14 alcoholic beverages per week, spread out throughout the week (max 2 per day). Avoid binge drinking or consuming large quantities of alcohol in one setting.  Please let me know if you  feel you may need help with reduction or quitting alcohol consumption.   Avoidance of nicotine, if used. Please let me know if you feel you may need help with reduction or quitting nicotine use.   Daily mental health attention. This can be in the form of 5 minute daily meditation, prayer, journaling, yoga, reflection, etc.  Purposeful attention to your emotions and mental state can significantly improve your overall wellbeing  and  Health.  Please know that I am here to help you with all of your health care goals and am happy to work with you to find a solution that works best for you.  The greatest advice I have received with any changes in life are to take it one step at a time, that even means if all you can focus on is the next 60 seconds, then do that and celebrate your victories.  With any changes in life, you will have set backs, and that is OK. The important thing to remember is, if you have a set back, it is not a  failure, it is an opportunity to try again! Screening Testing Mammogram Every 1 -2 years based on history and risk factors Starting at age 47 Pap Smear Ages 21-39 every 3 years Ages 67-65 every 5 years with HPV testing More frequent testing may be required based on results and history Colon Cancer Screening Every 1-10 years based on test performed, risk factors, and history Starting at age 31 Bone Density Screening Every 2-10 years based on history Starting at age 48 for women Recommendations for men differ based on medication usage, history, and risk factors AAA Screening One time ultrasound Men 24-35 years old who have every smoked Lung Cancer Screening Low Dose Lung CT every 12 months Age 26-80 years with a 30 pack-year smoking history who still smoke or who have quit within the last 15 years   Screening Labs Routine  Labs: Complete Blood Count (CBC), Complete Metabolic Panel (CMP), Cholesterol (Lipid Panel) Every 6-12 months based on history and  medications May be recommended more frequently based on current conditions or previous results Hemoglobin A1c Lab Every 3-12 months based on history and previous results Starting at age 8 or earlier with diagnosis of diabetes, high cholesterol, BMI >26, and/or risk factors Frequent monitoring for patients with diabetes to ensure blood sugar control Thyroid  Panel (TSH) Every 6 months based on history, symptoms, and risk factors May be repeated more often if on medication HIV One time testing for all patients 20 and older May be repeated more frequently for patients with increased risk factors or exposure Hepatitis C One time testing for all patients 71 and older May be repeated more frequently for patients with increased risk factors or exposure Gonorrhea, Chlamydia Every 12 months for all sexually active persons 13-24 years Additional monitoring may be recommended for those who are considered high risk or who have symptoms Every 12 months for any woman on birth control, regardless of sexual activity PSA Men 75-86 years old with risk factors Additional screening may be recommended from age 15-69 based on risk factors, symptoms, and history  Vaccine Recommendations Tetanus Booster All adults every 10 years Flu Vaccine All patients 6 months and older every year COVID Vaccine All patients 12 years and older Initial dosing with booster May recommend additional booster based on age and health history HPV Vaccine 2 doses all patients age 35-26 Dosing may be considered for patients over 26 Shingles Vaccine (Shingrix) 2 doses all adults 55 years and older Pneumonia (Pneumovax 36) All adults 65 years and older May recommend earlier dosing based on health history One year apart from Prevnar 69 Pneumonia (Prevnar 57) All adults 65 years and older Dosed 1 year after Pneumovax 23 Pneumonia (Prevnar 20) One time alternative to the two dosing of 13 and 23 For all adults with initial  dose of 23, 20 is recommended 1 year later For all adults with initial dose of 13, 23 is still recommended as second option 1 year later

## 2023-05-07 NOTE — Patient Instructions (Addendum)
 You can come back by tomorrow morning to get your labs done.   Sleep Apnea  If you decide that you would like to see the Pulmonary Sleep Specialist, I will be happy to send the referral in for that. They seem to do a good job with making sure the equipment is actually effective for you and that your settings are appropriate.  This information can help managing your weight and blood sugars  I recommend trying to follow a diet with the following: Calories: 1200-1500 calories per day Carbohydrates: 150-180 grams of carbohydrates per day  Why: Gives your body enough quick fuel for cells to maintain normal function without sending them into starvation mode.  Protein: At least 90 grams of protein per day- 30 grams with each meal Why: Protein takes longer and uses more energy than carbohydrates to break down for fuel. The carbohydrates in your meals serves as quick energy sources and proteins help use some of that extra quick energy to break down to produce long term energy. This helps you not feel hungry as quickly and protein breakdown burns calories.  Water: Drink AT LEAST 64 ounces of water per day  Why: Water is essential to healthy metabolism. Water helps to fill the stomach and keep you fuller longer. Water is required for healthy digestion and filtering of waste in the body.  Fat: Limit fats in your diet- when choosing fats, choose foods with lower fats content such as lean meats (chicken, fish, turkey).  Why: Increased fat intake leads to storage for later. Once you burn your carbohydrate energy, your body goes into fat and protein breakdown mode to help you loose weight.  Cholesterol: Fats and oils that are LIQUID at room temperature are best. Choose vegetable oils (olive oil, avocado oil, nuts). Avoid fats that are SOLID at room temperature (animal fats, processed meats). Healthy fats are often found in whole grains, beans, nuts, seeds, and berries.  Why: Elevated cholesterol levels lead  to build up of cholesterol on the inside of your blood vessels. This will eventually cause the blood vessels to become hard and can lead to high blood pressure and damage to your organs. When the blood flow is reduced, but the pressure is high from cholesterol buildup, parts of the cholesterol can break off and form clots that can go to the brain or heart leading to a stroke or heart attack.  Fiber: Increase amount of SOLUBLE the fiber in your diet. This helps to fill you up, lowers cholesterol, and helps with digestion. Some foods high in soluble fiber are oats, peas, beans, apples, carrots, barley, and citrus fruits.   Why: Fiber fills you up, helps remove excess cholesterol, and aids in healthy digestion which are all very important in weight management.   I recommend the following as a minimum activity routine: Purposeful walk or other physical activity at least 20 minutes every single day. This means purposefully taking a walk, jog, bike, swim, treadmill, elliptical, dance, etc.  This activity should be ABOVE your normal daily activities, such as walking at work. Goal exercise should be at least 150 minutes a week- work your way up to this.   Heart Rate: Your maximum exercise heart rate should be 220 - Your Age in Years. When exercising, get your heart rate up, but avoid going over the maximum targeted heart rate.  60-70% of your maximum heart rate is where you tend to burn the most fat. To find this number:  75 - Age In  Years= Max HR  Max HR x 0.6 (or 0.7) = Fat Burning HR The Fat Burning HR is your goal heart rate while working out to burn the most fat.  NEVER exercise to the point your feel lightheaded, weak, nauseated, dizzy. If you experience ANY of these symptoms- STOP exercise! Allow yourself to cool down and your heart rate to come down. Then restart slower next time.  If at ANY TIME you feel chest pain or chest pressure during exercise, STOP IMMEDIATELY and seek medical attention.

## 2023-05-08 ENCOUNTER — Other Ambulatory Visit: Payer: Medicare Other

## 2023-05-08 LAB — LIPID PANEL

## 2023-05-09 ENCOUNTER — Encounter: Payer: Self-pay | Admitting: Nurse Practitioner

## 2023-05-09 DIAGNOSIS — Z0000 Encounter for general adult medical examination without abnormal findings: Secondary | ICD-10-CM | POA: Insufficient documentation

## 2023-05-09 DIAGNOSIS — R7303 Prediabetes: Secondary | ICD-10-CM | POA: Insufficient documentation

## 2023-05-09 DIAGNOSIS — E039 Hypothyroidism, unspecified: Secondary | ICD-10-CM | POA: Insufficient documentation

## 2023-05-09 DIAGNOSIS — G8929 Other chronic pain: Secondary | ICD-10-CM | POA: Insufficient documentation

## 2023-05-09 DIAGNOSIS — E559 Vitamin D deficiency, unspecified: Secondary | ICD-10-CM | POA: Insufficient documentation

## 2023-05-09 HISTORY — DX: Encounter for general adult medical examination without abnormal findings: Z00.00

## 2023-05-09 LAB — COMPREHENSIVE METABOLIC PANEL
ALT: 11 IU/L (ref 0–32)
AST: 16 IU/L (ref 0–40)
Albumin: 4 g/dL (ref 3.7–4.7)
Alkaline Phosphatase: 143 IU/L — ABNORMAL HIGH (ref 44–121)
BUN/Creatinine Ratio: 18 (ref 12–28)
BUN: 14 mg/dL (ref 8–27)
Bilirubin Total: 0.6 mg/dL (ref 0.0–1.2)
CO2: 25 mmol/L (ref 20–29)
Calcium: 9.7 mg/dL (ref 8.7–10.3)
Chloride: 103 mmol/L (ref 96–106)
Creatinine, Ser: 0.79 mg/dL (ref 0.57–1.00)
Globulin, Total: 2.4 g/dL (ref 1.5–4.5)
Glucose: 71 mg/dL (ref 70–99)
Potassium: 4.5 mmol/L (ref 3.5–5.2)
Sodium: 142 mmol/L (ref 134–144)
Total Protein: 6.4 g/dL (ref 6.0–8.5)
eGFR: 75 mL/min/{1.73_m2} (ref >59)

## 2023-05-09 LAB — IRON,TIBC AND FERRITIN PANEL
Ferritin: 148 ng/mL (ref 15–150)
Iron Saturation: 27 % (ref 15–55)
Iron: 62 ug/dL (ref 27–139)
Total Iron Binding Capacity: 232 ug/dL — ABNORMAL LOW (ref 250–450)
UIBC: 170 ug/dL (ref 118–369)

## 2023-05-09 LAB — CBC WITH DIFFERENTIAL/PLATELET
Basophils Absolute: 0 10*3/uL (ref 0.0–0.2)
Basos: 1 %
EOS (ABSOLUTE): 0.1 10*3/uL (ref 0.0–0.4)
Eos: 2 %
Hematocrit: 41.5 % (ref 34.0–46.6)
Hemoglobin: 13.1 g/dL (ref 11.1–15.9)
Immature Grans (Abs): 0 10*3/uL (ref 0.0–0.1)
Immature Granulocytes: 0 %
Lymphocytes Absolute: 1.2 10*3/uL (ref 0.7–3.1)
Lymphs: 21 %
MCH: 28.7 pg (ref 26.6–33.0)
MCHC: 31.6 g/dL (ref 31.5–35.7)
MCV: 91 fL (ref 79–97)
Monocytes Absolute: 0.5 10*3/uL (ref 0.1–0.9)
Monocytes: 8 %
Neutrophils Absolute: 4.1 10*3/uL (ref 1.4–7.0)
Neutrophils: 68 %
Platelets: 275 10*3/uL (ref 150–450)
RBC: 4.56 x10E6/uL (ref 3.77–5.28)
RDW: 13.2 % (ref 11.7–15.4)
WBC: 6 10*3/uL (ref 3.4–10.8)

## 2023-05-09 LAB — LIPID PANEL
Cholesterol, Total: 194 mg/dL (ref 100–199)
HDL: 75 mg/dL (ref >39)
LDL CALC COMMENT:: 2.6 ratio (ref 0.0–4.4)
LDL Chol Calc (NIH): 105 mg/dL — ABNORMAL HIGH (ref 0–99)
Triglycerides: 75 mg/dL (ref 0–149)
VLDL Cholesterol Cal: 14 mg/dL (ref 5–40)

## 2023-05-09 LAB — HEMOGLOBIN A1C
Est. average glucose Bld gHb Est-mCnc: 111 mg/dL
Hgb A1c MFr Bld: 5.5 % (ref 4.8–5.6)

## 2023-05-09 NOTE — Telephone Encounter (Signed)
 [facility] - prior authorization NOT required  Specialty Pharmacy ([facility]) - prior authorization NOT required

## 2023-05-09 NOTE — Assessment & Plan Note (Signed)
 Reports difficulty with CPAP usage due to discomfort and claustrophobia. Cardiology recommends continued use to prevent potential AFib triggers. Discussed referral to a pulmonology sleep specialist for better management and fitting of the CPAP mask. Patient will decide later. - Consider referral to pulmonology sleep specialist if patient decides to pursue further management

## 2023-05-09 NOTE — Assessment & Plan Note (Signed)
 Awaiting appointment with orthopedic surgeon Dr. Murrell Arrant for left knee pain. Previous treatments, including injections, were unsuccessful. Discussed referral and expected timeline for appointment. - Follow up with orthopedic referral to Dr. Murrell Arrant - Monitor for updates on appointment scheduling

## 2023-05-09 NOTE — Assessment & Plan Note (Signed)
 Post-ablation, currently asymptomatic. Cardiology recommends continued CPAP use to prevent potential AFib triggers from sleep apnea. Discussed the importance of managing sleep apnea to prevent AFib recurrence. - Monitor for any recurrence of AFib/aflutter symptoms

## 2023-05-09 NOTE — Assessment & Plan Note (Signed)
 Slightly elevated blood sugar in June. Discussed insulin resistance, its role in weight management, and the importance of balancing carbohydrates and protein intake. Provided educational materials on maintaining weight and blood sugar levels. - Provide educational materials on maintaining weight and blood sugar levels - Encourage balanced diet with protein and carbohydrates - Monitor blood sugar levels periodically

## 2023-05-10 NOTE — Telephone Encounter (Signed)
VOB initiated for Gelsyn for RIGHT knee OA.

## 2023-05-11 ENCOUNTER — Ambulatory Visit: Payer: Medicare Other | Admitting: Pain Medicine

## 2023-05-11 VITALS — BP 134/64 | Ht 61.0 in | Wt 170.0 lb

## 2023-05-11 DIAGNOSIS — M1711 Unilateral primary osteoarthritis, right knee: Secondary | ICD-10-CM

## 2023-05-11 MED ORDER — CAPSAICIN 0.1 % EX CREA
1.0000 | TOPICAL_CREAM | Freq: Four times a day (QID) | CUTANEOUS | 0 refills | Status: DC
Start: 2023-05-11 — End: 2023-07-06

## 2023-05-11 NOTE — Telephone Encounter (Signed)
 [employer] - Prior Authorization NOT required

## 2023-05-11 NOTE — Telephone Encounter (Addendum)
[facility] and Leo Rod for RIGHT knee OA  OK to schedule on or after 10/04/23  Primary Insurance: [facility] Medicare Adv PPO Co-pay: $30 Co-insurance: 10% Deductible: does not apply Prior Auth: NOT   Knee Injection History 04/05/22 - Kenalog RIGHT 09/19/22 - Kenalog RIGHT 11/30/22 - Kenalog RIGHT 03/01/23 - Zilretta RIGHT 03/21/23 - Gelsyn #1 RIGHT 03/28/23 - Gelsyn #2 RIGHT 04/04/23 - Gelsyn #3 RIGHT

## 2023-05-11 NOTE — Progress Notes (Signed)
DATE OF VISIT: 05/11/2023        Martha Gilmore DOB: 05-Nov-1940 MRN: [id number]  CC:  R knee pain  History- Martha Gilmore is a 83 y.o. female in clinic for for evaluation and treatment of R knee pain.  Pain complaint: Location: R knee Severity: 8/10 Timing: constant with worsening throughout day Radiation: Denies Aggravating factors: walking, standing Alleviating factors: rest, tylenol.  Previous intraarticular steroid injections would work, but denies recent benefit.  Potentially pending TKA, but delayed due to oral surgery Attribution: Knee OA   Past Medical History Past Medical History:  Diagnosis Date   Allergy    Aortic insufficiency    mild to moderate by echo 12/2020   Arthritis    Atrial flutter (HCC) 12/30/2020   Cataracts, bilateral 09/23/2015   Clostridium difficile infection 04/16/2017   H/o C.diff due to clindamicyin 09/2016; two rounds of treatment for cure. Dr. Elnoria Howard   Depression    Encounter for annual physical exam 05/09/2023   GERD (gastroesophageal reflux disease)    Heart murmur    Hyperlipidemia    OAB (overactive bladder) 12/16/2013   Osteopenia 08/05/2012   Dexa scan 07/2012 mild osteopenia, T = -1.1   Other fatigue 09/25/2022   Seasonal allergies    Sleep apnea    Thyroid disease    Urge and stress incontinence     Past Surgical History Past Surgical History:  Procedure Laterality Date   A-FLUTTER ABLATION N/A 10/14/2021   Procedure: A-FLUTTER ABLATION;  Surgeon: Regan Lemming, MD;  Location: [facility];  Service: Cardiovascular;  Laterality: N/A;   APPENDECTOMY  1971   CARDIOVERSION N/A 04/13/2021   Procedure: CARDIOVERSION;  Surgeon: Christell Constant, MD;  Location: [facility] ENDOSCOPY;  Service: Cardiovascular;  Laterality: N/A;   CESAREAN SECTION     CHOLECYSTECTOMY     EYE SURGERY  cataracts   Teeth Implants     TRIGGER FINGER RELEASE     VAGINAL HYSTERECTOMY      Medications Current Outpatient Medications   Medication Sig Dispense Refill   Capsaicin 0.1 % CREA Apply 1 application  topically in the morning, at noon, in the evening, and at bedtime. Apply a small amount to right knee four times per day for pain 56.6 g 0   acetaminophen (TYLENOL) 500 MG tablet Take 500 mg by mouth every 6 (six) hours as needed.     atorvastatin (LIPITOR) 10 MG tablet TAKE 1 TABLET BY MOUTH EVERYDAY AT BEDTIME 90 tablet 3   Cholecalciferol (VITAMIN D3) 1.25 MG (50000 UT) TABS Take 1 tablet by mouth once a week. 12 tablet 3   citalopram (CELEXA) 20 MG tablet TAKE 1 TABLET BY MOUTH EVERY DAY 90 tablet 3   levothyroxine (SYNTHROID) 100 MCG tablet Take 1 tablet (100 mcg total) by mouth daily before breakfast. Mon- Sat 100 mcg daily, and Sun 150 mcg daily (Patient taking differently: Take 100 mcg by mouth daily before breakfast.)     Multiple Vitamins-Minerals (PRESERVISION AREDS PO) Take 1 capsule by mouth in the morning and at bedtime.     omeprazole (PRILOSEC) 20 MG capsule TAKE 1 CAPSULE BY MOUTH EVERY DAY 90 capsule 3   oxybutynin (DITROPAN-XL) 10 MG 24 hr tablet TAKE ONE TABLET BY MOUTH AT BEDTIME 90 tablet 3   Polyethyl Glycol-Propyl Glycol (SYSTANE OP) Place 1 drop into both eyes daily as needed (DRY EYES).     No current facility-administered medications for this visit.    Allergies is allergic to aspirin,  demerol  [meperidine hcl], penicillins, statins, cephalexin, clindamycin/lincomycin, diclofenac, and tape.  Family History - reviewed per EMR and intake form  Social History   reports no history of alcohol use.  reports that she has never smoked. She has been exposed to tobacco smoke. She has never used smokeless tobacco.  reports no history of drug use.  EXAM: Vitals: BP 134/64   Ht 5\' 1"  (1.549 m)   Wt 170 lb (77.1 kg)   BMI 32.12 kg/m  General: AOx3, NAD, pleasant SKIN: no rashes or lesions, skin clean, dry, intact MSK: Uses cane to walk   NEURO: sensation intact to light  touch  IMAGING: XRAYS: Knee OA   ASSESSMENT:   ICD-10-CM   1. Primary osteoarthritis of right knee  M17.11 Capsaicin 0.1 % CREA       PLAN: 1. Capsaicin cream QID.  Explained that pt will need to apply 3-4x daily for extended period of time for benefit. 2. Discussed R knee genicular blocks.  Patient is interested, but had IA steroid injection in knee yesterday.  Will need to delay 2-3 months if performing genicular block with steroids.  If purely dx injection, can delay 2-3 weeks.  If successful, can consider referral to pain management for ablative therapy. 3. F/u as needed.

## 2023-05-15 ENCOUNTER — Other Ambulatory Visit: Payer: Self-pay | Admitting: Family Medicine

## 2023-05-17 NOTE — Telephone Encounter (Signed)
Holding until needed by patient.

## 2023-05-17 NOTE — Telephone Encounter (Addendum)
ZILRETTA for RIGHT knee OA  OK to schedule on or after 05/25/23  [employer]  Primary Insurance: [facility] Medicare Adv PPO Co-pay: $30 Co-insurance: 10% Deductible: does not apply Prior Auth: NOT required  Pharmacy Benefit  Prior Authorization NOT required   Knee Injection History 04/05/22 - Kenalog RIGHT 09/19/22 - Kenalog RIGHT 11/30/22 - Kenalog RIGHT 03/01/23 - Zilretta RIGHT 03/21/23 - Gelsyn #1 RIGHT 03/28/23 - Gelsyn #2 RIGHT 04/04/23 Unk Lightning #3

## 2023-05-22 ENCOUNTER — Other Ambulatory Visit: Payer: Self-pay | Admitting: Nurse Practitioner

## 2023-05-22 DIAGNOSIS — Z1231 Encounter for screening mammogram for malignant neoplasm of breast: Secondary | ICD-10-CM

## 2023-05-29 ENCOUNTER — Encounter: Payer: Self-pay | Admitting: Nurse Practitioner

## 2023-05-29 ENCOUNTER — Ambulatory Visit
Admission: RE | Admit: 2023-05-29 | Discharge: 2023-05-29 | Disposition: A | Discharge status: Home / Self Care | Payer: Medicare Other | Source: Ambulatory Visit | Attending: Nurse Practitioner

## 2023-05-29 ENCOUNTER — Ambulatory Visit: Payer: Medicare Other | Admitting: Nurse Practitioner

## 2023-05-29 VITALS — BP 122/72 | HR 71 | Wt 165.6 lb

## 2023-05-29 DIAGNOSIS — W19XXXA Unspecified fall, initial encounter: Secondary | ICD-10-CM | POA: Insufficient documentation

## 2023-05-29 DIAGNOSIS — M94261 Chondromalacia, right knee: Secondary | ICD-10-CM | POA: Diagnosis not present

## 2023-05-29 DIAGNOSIS — R2689 Other abnormalities of gait and mobility: Secondary | ICD-10-CM | POA: Diagnosis not present

## 2023-05-29 NOTE — Assessment & Plan Note (Signed)
 Concerned that the fall might have exacerbated her knee condition. Physical examination by Dr. Yvone indicated no significant changes from previous assessments. - Monitor knee condition - Consider physical therapy for knee strengthening if no coccyx fracture is present

## 2023-05-29 NOTE — Patient Instructions (Addendum)
 I have sent the order for the xray to [facility] at [address] [employer]. You can walk in to have this done.  Once we determine what is going on, we can make a decision if further treatment or just rest is needed.   Tailbone Injury  The tailbone is the small bone at the lower end of the spine. The tailbone is also called the coccyx.A tailbone injury may involve stretched ligaments, bruising, or a broken bone/break (fracture). Tailbone injuries can be painful, and some may take a long time to heal. What are the causes? This condition may be caused by: Falling and landing on the tailbone. Repeated strain or friction from sitting for long periods of time. This may include actions such as rowing or bicycling. Childbirth. In some cases, the cause may not be known. What are the signs or symptoms? Symptoms of this condition include: Pain in the tailbone area or lower back, especially when sitting. Pain or difficulty when standing up from a sitting position. Bruising or swelling in the tailbone area. Painful bowel movements. In females, pain during sex. How is this diagnosed? This condition may be diagnosed based on your symptoms and a physical exam. If your health care provider suspects a fracture, you may have additional tests, such as: X-rays. CT scan. MRI. How is this treated? Most tailbone injuries heal on their own in 4-6 weeks. However, recovery time may be longer if there is a fracture. If treatment is needed, it may include: NSAIDs or other over-the-counter medicines to help relieve your pain. Rubber or inflated ring or cushion to take pressure off the tailbone when sitting. Physical therapy. Injection with local anesthesia and steroid medicine. This is done only if the pain does not improve over time with over-the-counter pain medicines. Follow these instructions at home: Activity Rest as told by your health care provider. Do not sit for a long time without moving. Get  up to take short walks every 1-2 hours. This will improve blood flow and breathing. Ask for help if you feel weak or unsteady. Wear appropriate padding and sports gear when bicycling or rowing. This will prevent repeating an injury that is caused by strain or friction. Do exercises as told by your health care provider. Increase your activity as the pain allows. Return to your normal activities as told by your health care provider. Ask your health care provider what activities are safe for you. Managing pain, stiffness, and swelling To help decrease discomfort when sitting: Sit on your rubber or inflated ring or cushion as told by your health care provider. Lean forward when you sit. If directed, put ice on the injured area. To do this: Put ice in a plastic bag. Place a towel between your skin and the bag. Leave the ice on for 20 minutes, 2-3 times per day for the first 1-2 days. If your skin turns bright red, remove the ice right away to prevent skin damage. The risk of skin damage is higher if you cannot feel pain, heat, or cold. If directed, apply heat to the affected area as often as told by your health care provider. Use the heat source that your health care provider recommends, such as a moist heat pack or a heating pad. Place a towel between your skin and the heat source. Leave the heat on for 20-30 minutes. If your skin turns bright red, remove the heat right away to prevent burns. The risk of burns is higher if you cannot feel pain,  heat, or cold. General instructions Take over-the-counter and prescription medicines only as told by your health care provider. Your condition or medicine may cause constipation or painful bowel movements. To prevent or treat constipation, you may need to: Drink enough fluid to keep your urine pale yellow. Take over-the-counter or prescription medicines. Eat foods that are high in fiber, such as beans, whole grains, and fresh fruits and vegetables. Limit  foods that are high in fat and processed sugars, such as fried or sweet foods. Contact a health care provider if: Your pain becomes worse or is not controlled with medicine. Your bowel movements cause a great deal of discomfort. You are unable to have a bowel movement after 4 days. You have pain during sex. Summary A tailbone injury may involve stretched ligaments, bruising, or a broken bone/break (fracture). Tailbone injuries can be painful. Most heal on their own in 4-6 weeks. Treatment may include taking NSAIDs, doing physical therapy, or using a rubber or inflated ring or cushion when sitting. Follow any recommendations from your health care provider to prevent or treat constipation. This information is not intended to replace advice given to you by your health care provider. Make sure you discuss any questions you have with your health care provider. Document Revised: 07/19/2021 Document Reviewed: 07/19/2021 Elsevier Patient Education  2024 [employer].

## 2023-05-29 NOTE — Assessment & Plan Note (Signed)
 Experienced a fall on May 17, 2023, resulting in significant tailbone pain. Physical examination revealed tenderness over the right sit bone area, suggesting deep bruising or a coccyx fracture. Discussed the potential for prolonged pain and complications with untreated fractures. Explained that deep bruising can also be very painful and slow to heal. Discussed pain management strategies and potential use of steroids for inflammation if no fracture is found. Advised to monitor for warning signs such as changes in bowel or bladder control, numbness or tingling in legs, or increased falls. - Order x-ray of the coccyx at [facility] - Advise use of ice and heat for pain management - Recommend use of a neck pillow to alleviate pressure on the tailbone when sitting - Consider physical therapy for gait issues if no fracture is present - Discuss potential use of a short course of steroids for inflammation if no fracture is found - Provide information on tailbone injuries - Advise to monitor for warning signs such as changes in bowel or bladder control, numbness or tingling in legs, or increased falls
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
## 2023-06-01 ENCOUNTER — Encounter: Payer: Self-pay | Admitting: Nurse Practitioner

## 2023-06-06 ENCOUNTER — Encounter: Payer: Self-pay | Admitting: Nurse Practitioner

## 2023-06-22 ENCOUNTER — Ambulatory Visit: Payer: Medicare Other | Admitting: Pain Medicine

## 2023-06-22 ENCOUNTER — Other Ambulatory Visit: Payer: Self-pay

## 2023-06-22 VITALS — BP 127/62 | Ht 61.0 in | Wt 163.0 lb

## 2023-06-22 DIAGNOSIS — M1711 Unilateral primary osteoarthritis, right knee: Secondary | ICD-10-CM

## 2023-06-22 MED ORDER — DEXAMETHASONE SODIUM PHOSPHATE 10 MG/ML IJ SOLN
10.0000 mg | Freq: Once | INTRAMUSCULAR | Status: AC
  Administered 2023-06-22: 10 mg via INTRA_ARTICULAR

## 2023-06-22 NOTE — Progress Notes (Signed)
 Patient ID: Martha Gilmore, female   DOB: Feb 02, 1941, 83 y.o.   MRN: [id number] DATE OF VISIT: 06/22/2023        Martha Gilmore DOB: June 13, 1940 MRN: [id number]  CC:  R knee pain  History- Brenlyn [name] is a 83 y.o. female in clinic for for evaluation and treatment of R knee pain.  Reports using capsaicin twice but discontinuing due to redness and burning pain with application.  Not interested in attempting a lower dose.   Pain complaint: Location:  R knee Severity: 5/10 today Timing: constant with worsening throughout day Radiation: Denies Aggravating factors: walking, standing Alleviating factors: rest, tylenol.  Previous intraarticular steroid injections would work, but denies recent benefit.  Potentially pending TKA, but delayed due to oral surgery Attribution: Knee OA   Past Medical History Past Medical History:  Diagnosis Date   Allergy    Aortic insufficiency    mild to moderate by echo 12/2020   Arthritis    Atrial flutter (HCC) 12/30/2020   Cataracts, bilateral 09/23/2015   Clostridium difficile infection 04/16/2017   H/o C.diff due to clindamicyin 09/2016; two rounds of treatment for cure. Dr. Elnoria Howard   Depression    Encounter for annual physical exam 05/09/2023   GERD (gastroesophageal reflux disease)    Heart murmur    Hyperlipidemia    OAB (overactive bladder) 12/16/2013   Osteopenia 08/05/2012   Dexa scan 07/2012 mild osteopenia, T = -1.1   Other fatigue 09/25/2022   Seasonal allergies    Sleep apnea    Thyroid disease    Urge and stress incontinence     Past Surgical History Past Surgical History:  Procedure Laterality Date   A-FLUTTER ABLATION N/A 10/14/2021   Procedure: A-FLUTTER ABLATION;  Surgeon: Regan Lemming, MD;  Location: [facility];  Service: Cardiovascular;  Laterality: N/A;   APPENDECTOMY  1971   CARDIOVERSION N/A 04/13/2021   Procedure: CARDIOVERSION;  Surgeon: Christell Constant, MD;  Location: [facility];  Service:  Cardiovascular;  Laterality: N/A;   CESAREAN SECTION     CHOLECYSTECTOMY     EYE SURGERY  cataracts   Teeth Implants     TRIGGER FINGER RELEASE     VAGINAL HYSTERECTOMY      Medications Current Outpatient Medications  Medication Sig Dispense Refill   acetaminophen (TYLENOL) 500 MG tablet Take 500 mg by mouth every 6 (six) hours as needed.     atorvastatin (LIPITOR) 10 MG tablet TAKE 1 TABLET BY MOUTH EVERYDAY AT BEDTIME 90 tablet 3   Capsaicin 0.1 % CREA Apply 1 application  topically in the morning, at noon, in the evening, and at bedtime. Apply a small amount to right knee four times per day for pain 56.6 g 0   Cholecalciferol (VITAMIN D3) 1.25 MG (50000 UT) TABS Take 1 tablet by mouth once a week. 12 tablet 3   citalopram (CELEXA) 20 MG tablet TAKE 1 TABLET BY MOUTH EVERY DAY 90 tablet 3   levothyroxine (SYNTHROID) 100 MCG tablet Take 1 tablet (100 mcg total) by mouth daily before breakfast. Mon- Sat 100 mcg daily, and Sun 150 mcg daily (Patient taking differently: Take 100 mcg by mouth daily before breakfast.)     Multiple Vitamins-Minerals (PRESERVISION AREDS PO) Take 1 capsule by mouth in the morning and at bedtime.     omeprazole (PRILOSEC) 20 MG capsule TAKE 1 CAPSULE BY MOUTH EVERY DAY 90 capsule 3   oxybutynin (DITROPAN-XL) 10 MG 24 hr tablet TAKE ONE TABLET BY MOUTH AT  BEDTIME 90 tablet 3   Polyethyl Glycol-Propyl Glycol (SYSTANE OP) Place 1 drop into both eyes daily as needed (DRY EYES).     No current facility-administered medications for this visit.    Allergies is allergic to aspirin, demerol  [meperidine hcl], nsaids, penicillins, statins, cephalexin, clindamycin/lincomycin, diclofenac, gabapentin, and tape.  Family History - reviewed per EMR and intake form  Social History   reports no history of alcohol use.  reports that she has never smoked. She has been exposed to tobacco smoke. She has never used smokeless tobacco.  reports no history of drug  use.  EXAM: Vitals: BP 127/62   Ht 5\' 1"  (1.549 m)   Wt 163 lb (73.9 kg)   BMI 30.80 kg/m  General: AOx3, NAD, pleasant SKIN: no rashes or lesions, skin clean, dry, intact MSK: Uses cane to walk   NEURO: sensation intact to light touch  IMAGING: XRAYS: Knee OA   ASSESSMENT:   ICD-10-CM   1. Primary osteoarthritis of right knee  M17.11 [place] LIMITED JOINT SPACE STRUCTURES LOW RIGHT        PLAN: 1. R knee genicular blocks with steroid today.  If successful, can consider referral to pain management for ablative therapy vs chemoneuolysis vs cryoablation. 2. F/u in 1 month.

## 2023-06-22 NOTE — Progress Notes (Signed)
 PRE-OP DIAGNOSIS: R knee osteoarthritis PROCEDURE: R knee genicular nerve blocks under US guidance Performing Physician: Celso Sickle, MD   Total Dose:       bupivacaine 0.5%      5  mL                           dexamethasone 10mg /ml  1mL   R/B/A of procedure provided.  Risks of infection, bleeding, damage to other structures and nerves explained.    Procedure: The area was prepped in the usual sterile manner with chlorhexidine. Sterile ultrasound gel was applied.  Ultrasound was used to identify the femoral shaft and condyles, along with the tibia shaft and head on the side noted above.  The inflection points were noted at the areas of the superior medial, superior lateral, and inferomedial genicular nerves.  A 25g 3.5 inch Quinky spinal needle was inserted through the skin and to the area of the nerves.  After negative aspiration, 2 ml of the above solution was injected at each of the 3 sites above.   There were no complications during this procedure.   Patient reported relief upon standing after the procedure.   Followup: The patient tolerated the procedure well without complications.  Standard post-procedure care is explained and return precautions are given.

## 2023-06-26 ENCOUNTER — Ambulatory Visit
Admission: RE | Admit: 2023-06-26 | Discharge: 2023-06-26 | Disposition: A | Discharge status: Home / Self Care | Payer: Medicare Other | Source: Ambulatory Visit | Attending: Nurse Practitioner | Admitting: Nurse Practitioner

## 2023-06-26 DIAGNOSIS — Z1231 Encounter for screening mammogram for malignant neoplasm of breast: Secondary | ICD-10-CM

## 2023-06-28 ENCOUNTER — Other Ambulatory Visit: Payer: Self-pay | Admitting: Family Medicine

## 2023-06-28 DIAGNOSIS — N3281 Overactive bladder: Secondary | ICD-10-CM

## 2023-07-04 ENCOUNTER — Encounter: Payer: Self-pay | Admitting: Nurse Practitioner

## 2023-07-06 ENCOUNTER — Encounter: Payer: Self-pay | Admitting: Nurse Practitioner

## 2023-07-06 ENCOUNTER — Other Ambulatory Visit: Payer: Self-pay | Admitting: Nurse Practitioner

## 2023-07-06 DIAGNOSIS — F339 Major depressive disorder, recurrent, unspecified: Secondary | ICD-10-CM

## 2023-07-06 MED ORDER — CITALOPRAM HYDROBROMIDE 20 MG PO TABS
20.0000 mg | ORAL_TABLET | Freq: Every day | ORAL | 3 refills | Status: AC
Start: 2023-07-06 — End: ?

## 2023-07-18 ENCOUNTER — Other Ambulatory Visit: Payer: Self-pay

## 2023-07-18 ENCOUNTER — Encounter: Payer: Self-pay | Admitting: Nurse Practitioner

## 2023-07-18 MED ORDER — ATORVASTATIN CALCIUM 10 MG PO TABS: ORAL_TABLET | ORAL | 1 refills | Status: DC

## 2023-07-20 ENCOUNTER — Ambulatory Visit (INDEPENDENT_AMBULATORY_CARE_PROVIDER_SITE_OTHER): Payer: Medicare Other | Admitting: Pain Medicine

## 2023-07-20 VITALS — BP 126/62 | Ht 61.0 in | Wt 163.0 lb

## 2023-07-20 DIAGNOSIS — M1711 Unilateral primary osteoarthritis, right knee: Secondary | ICD-10-CM | POA: Diagnosis not present

## 2023-07-20 NOTE — Progress Notes (Signed)
 DATE OF VISIT: 07/20/2023        Martha Gilmore DOB: [birth year] MRN: [id number]  CC:  R knee pain  History- Martha Gilmore is a 83 y.o. female in clinic for evaluation and treatment of R knee pain.  Overall, reports good, but short term relief from genicular block.  Was able to perform activities of daily living with greater ease and reports improved gait and function.  Unfortunately, pain has returned.  She notes that she was able to undergo her dental procedure, though she may be pending dental implant.  It is unclear on the timing around TKA at this point.  Prior procedures: R knee genicular block with steroid - 06/22/23 - 75% relief for 10 days  Past Medical History Past Medical History:  Diagnosis Date   Allergy    Aortic insufficiency    mild to moderate by echo 12/2020   Arthritis    Atrial flutter (HCC) 12/30/2020   Cataracts, bilateral 09/23/2015   Clostridium difficile infection 04/16/2017   H/o C.diff due to clindamicyin 09/2016; two rounds of treatment for cure. Dr. Elnoria Howard   Depression    Encounter for annual physical exam 05/09/2023   GERD (gastroesophageal reflux disease)    Heart murmur    Hyperlipidemia    OAB (overactive bladder) 12/16/2013   Osteopenia 08/05/2012   Dexa scan 07/2012 mild osteopenia, T = -1.1   Other fatigue 09/25/2022   Seasonal allergies    Sleep apnea    Thyroid disease    Urge and stress incontinence     Past Surgical History Past Surgical History:  Procedure Laterality Date   A-FLUTTER ABLATION N/A 10/14/2021   Procedure: A-FLUTTER ABLATION;  Surgeon: Regan Lemming, MD;  Location: [facility];  Service: Cardiovascular;  Laterality: N/A;   APPENDECTOMY  1971   CARDIOVERSION N/A 04/13/2021   Procedure: CARDIOVERSION;  Surgeon: Christell Constant, MD;  Location: [facility];  Service: Cardiovascular;  Laterality: N/A;   CESAREAN SECTION     CHOLECYSTECTOMY     EYE SURGERY  cataracts   Teeth Implants     TRIGGER  FINGER RELEASE     VAGINAL HYSTERECTOMY      Medications Current Outpatient Medications  Medication Sig Dispense Refill   acetaminophen (TYLENOL) 500 MG tablet Take 500 mg by mouth every 6 (six) hours as needed.     atorvastatin (LIPITOR) 10 MG tablet TAKE 1 TABLET BY MOUTH EVERYDAY AT BEDTIME 90 tablet 1   Cholecalciferol (VITAMIN D3) 1.25 MG (50000 UT) TABS Take 1 tablet by mouth once a week. 12 tablet 3   citalopram (CELEXA) 20 MG tablet Take 1 tablet (20 mg total) by mouth daily. 90 tablet 3   levothyroxine (SYNTHROID) 100 MCG tablet Take 1 tablet (100 mcg total) by mouth daily before breakfast. Mon- Sat 100 mcg daily, and Sun 150 mcg daily (Patient taking differently: Take 100 mcg by mouth daily before breakfast.)     Multiple Vitamins-Minerals (PRESERVISION AREDS PO) Take 1 capsule by mouth in the morning and at bedtime.     omeprazole (PRILOSEC) 20 MG capsule TAKE 1 CAPSULE BY MOUTH EVERY DAY 90 capsule 3   oxybutynin (DITROPAN-XL) 10 MG 24 hr tablet TAKE 1 TABLET BY MOUTH EVERYDAY AT BEDTIME 90 tablet 3   Polyethyl Glycol-Propyl Glycol (SYSTANE OP) Place 1 drop into both eyes daily as needed (DRY EYES).     No current facility-administered medications for this visit.    Allergies is allergic to aspirin, demerol  [meperidine  hcl], nsaids, penicillins, statins, cephalexin, clindamycin/lincomycin, diclofenac, gabapentin, and tape.  Family History - reviewed per EMR and intake form  Social History   reports no history of alcohol use.  reports that she has never smoked. She has been exposed to tobacco smoke. She has never used smokeless tobacco.  reports no history of drug use.  EXAM: Vitals: BP 126/62   Ht 5\' 1"  (1.549 m)   Wt 163 lb (73.9 kg)   BMI 30.80 kg/m  General: AOx3, NAD, pleasant SKIN: no rashes or lesions, skin clean, dry, intact MSK: Uses cane to walk  NEURO: sensation intact to light touch  ASSESSMENT:   ICD-10-CM   1. Primary osteoarthritis of right knee   M17.11        PLAN: 1. Given good response to genicular block, would recommend RFA or chemoneurolysis of genicular nerves at this points.  R/B/A to include infection, bleeding, muscle/tendon/ligament/vascular injury, weakness, inadequate pain relief explained.  Differences between RFA and alcohol neurolysis explained.  She is undecided at this point on the two different procedures, which also may be influenced by the ability of her to get a TKA in the near future.  Will hold off on scheduling at this time until she calls back.   - If RFA, will refer to [facility] pain clinic for evaluation for R knee genicular RFA given lack of equipment at [facility] clinic. - If chemoneurolysis, able to perform in clinic with US guidance.     Patient expressed understanding & agreement with above.

## 2023-07-31 ENCOUNTER — Other Ambulatory Visit: Payer: Self-pay | Admitting: *Deleted

## 2023-07-31 ENCOUNTER — Encounter: Payer: Self-pay | Admitting: Pain Medicine

## 2023-07-31 DIAGNOSIS — M1711 Unilateral primary osteoarthritis, right knee: Secondary | ICD-10-CM

## 2023-07-31 NOTE — Telephone Encounter (Signed)
 Consult placed for genicular RFA evaluation at [facility].

## 2023-08-14 ENCOUNTER — Encounter: Payer: Self-pay | Admitting: Nurse Practitioner

## 2023-08-15 ENCOUNTER — Encounter: Payer: Self-pay | Admitting: Pain Medicine

## 2023-08-26 ENCOUNTER — Other Ambulatory Visit: Payer: Self-pay | Admitting: Nurse Practitioner

## 2023-08-26 DIAGNOSIS — E559 Vitamin D deficiency, unspecified: Secondary | ICD-10-CM

## 2023-09-04 ENCOUNTER — Encounter: Payer: Self-pay | Admitting: Nurse Practitioner

## 2023-09-06 NOTE — Telephone Encounter (Signed)
 Patient is being treated with GAE and seems to be working. Will wait to run benefits until patient or doctor request them to be ran

## 2023-09-27 ENCOUNTER — Ambulatory Visit
Attending: Student in an Organized Health Care Education/Training Program | Admitting: Student in an Organized Health Care Education/Training Program

## 2023-09-27 ENCOUNTER — Encounter: Payer: Self-pay | Admitting: Cardiology

## 2023-09-27 ENCOUNTER — Encounter: Payer: Self-pay | Admitting: Student in an Organized Health Care Education/Training Program

## 2023-09-27 VITALS — BP 114/64 | HR 69 | Temp 97.1°F | Resp 16 | Ht 61.0 in | Wt 159.0 lb

## 2023-09-27 DIAGNOSIS — M25561 Pain in right knee: Secondary | ICD-10-CM | POA: Insufficient documentation

## 2023-09-27 DIAGNOSIS — G8929 Other chronic pain: Secondary | ICD-10-CM | POA: Diagnosis present

## 2023-09-27 DIAGNOSIS — M1711 Unilateral primary osteoarthritis, right knee: Secondary | ICD-10-CM | POA: Diagnosis present

## 2023-09-27 NOTE — Progress Notes (Signed)
 Safety precautions to be maintained throughout the outpatient stay will include: orient to surroundings, keep bed in low position, maintain call bell within reach at all times, provide assistance with transfer out of bed and ambulation.

## 2023-09-27 NOTE — Progress Notes (Signed)
 PROVIDER NOTE: Interpretation of information contained herein should be left to medically-trained personnel. Specific patient instructions are provided elsewhere under "Patient Instructions" section of medical record. This document was created in part using AI and STT-dictation technology, any transcriptional errors that may result from this process are unintentional.  Patient: Martha Gilmore  Service: E/M Encounter  Provider: Cephus Collin, MD  DOB: 1940-09-11  Delivery: Face-to-face  Specialty: Interventional Pain Management  MRN: [id number]  Setting: Ambulatory outpatient facility  Specialty designation: 09  Type: New Patient  Location: Outpatient office facility  PCP: Annella Kief, NP  DOS: 09/27/2023    Referring Prov.: Boies, Aubery Leader, MD   Primary Reason(s) for Visit: Encounter for initial evaluation of one or more chronic problems (new to examiner) potentially causing chronic pain, and posing a threat to normal musculoskeletal function. (Level of risk: High) CC: Knee Pain (right)  HPI  Martha Gilmore is a 83 y.o. year old, female patient, who comes for the first time to our practice referred by Dixon Fredrickson, MD for our initial evaluation of her chronic pain. She has Bilateral sensorineural hearing loss; DDD (degenerative disc disease), lumbosacral; GERD (gastroesophageal reflux disease); History of Graves' disease; Major depression, recurrent, chronic (HCC); Mixed hyperlipidemia; Osteoarthritis of CMC joint of thumb; Osteopenia; Postablative hypothyroidism; Seasonal allergies; Urge and stress incontinence; OAB (overactive bladder); Clostridium difficile infection; Family history of early CAD; Chondromalacia, knee, right; Tricompartment osteoarthritis of right knee; Atrial flutter (HCC); Aortic insufficiency; Obstructive sleep apnea; Long-term use of high-risk medication; Nonrheumatic aortic valve insufficiency; 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 Today she comes in for evaluation of her Knee Pain (right)  Pain Assessment: Location: Right Knee Radiating: hurts inner knee, radiating on outside to calve Onset: More than a month ago Duration: Chronic pain Quality: Aching, Constant, Nagging, Sharp, Shooting, Tender, Tiring Severity: 5 /10 (subjective, self-reported pain score)  Effect on ADL: prolonged walking and standing, standing, bending, travel Timing: Constant Modifying factors: rest BP: 114/64  HR: 69  Onset and Duration: Gradual Cause of pain: right knee injury 1977 Severity: Getting worse, NAS-11 at its worse: 10/10, NAS-11 at its best: 7/10, and NAS-11 on the average: 5/10 Timing: Morning, Night, During activity or exercise, After activity or exercise, and After a period of immobility Aggravating Factors: Bending, Climbing, Kneeling, Prolonged standing, Squatting, Walking, Walking uphill, and Walking downhill Alleviating Factors: Stretching, Cold packs, Hot packs, Lying down, and Resting Associated Problems: Weakness and Pain that wakes patient up Quality of Pain: Aching, Agonizing, Nagging, Sharp, Shooting, Tender, and Tiring Previous Examinations or Tests: X-rays and Orthopedic evaluation Previous Treatments: Physical Therapy, Steroid treatments by mouth, and Stretching exercises  Martha Gilmore is being evaluated for possible interventional pain management therapies for the treatment of her chronic pain.   Discussed the use of AI scribe software for clinical note transcription with the patient, who gave verbal consent to proceed.  History of Present Illness   Martha Gilmore "Deatra Face" is an 83 year old female who presents with  knee pain for evaluation of potential genicular nerve block and ablation. She was referred by Dr. Rollene Clink for evaluation of her knee pain.  She has been experiencing chronic knee pain and has previously undergone physical therapy to strengthen her knee in preparation for possible surgery. She  states that she has not had a genicular nerve block before, but has received steroid injections in her knee. She has had successful ablation procedures on her back in the past, which have provided significant relief.  However when I look at her previous notes, it seems as if she did have a right knee genicular nerve block  on 06/22/2023.  This did provide her with pain relief, 75% for approximately 5 days.  She does not recall this.  Even though it is documented in previous notes  She has tried gel injections and steroid injections for her knee pain. She has also tried gabapentin  in the past, which was not helpful.  Her past medical history includes Graves' disease and atrial fibrillation. She is not currently on any blood thinners.       Meds   Current Outpatient Medications:    acetaminophen  (TYLENOL ) 500 MG tablet, Take 500 mg by mouth every 6 (six) hours as needed., Disp: , Rfl:    atorvastatin  (LIPITOR) 10 MG tablet, TAKE 1 TABLET BY MOUTH EVERYDAY AT BEDTIME, Disp: 90 tablet, Rfl: 1   Cholecalciferol (VITAMIN D3) 1.25 MG (50000 UT) CAPS, TAKE 1 CAPSULE BY MOUTH ONE TIME PER WEEK, Disp: 12 capsule, Rfl: 3   citalopram  (CELEXA ) 20 MG tablet, Take 1 tablet (20 mg total) by mouth daily., Disp: 90 tablet, Rfl: 3   levothyroxine  (SYNTHROID ) 100 MCG tablet, Take 1 tablet (100 mcg total) by mouth daily before breakfast. Mon- Sat 100 mcg daily, and Sun 150 mcg daily (Patient taking differently: Take 100 mcg by mouth daily before breakfast.), Disp: , Rfl:    Multiple Vitamins-Minerals (PRESERVISION AREDS PO), Take 1 capsule by mouth in the morning and at bedtime., Disp: , Rfl:    omeprazole  (PRILOSEC) 20 MG capsule,  TAKE 1 CAPSULE BY MOUTH EVERY DAY, Disp: 90 capsule, Rfl: 3   oxybutynin  (DITROPAN -XL) 10 MG 24 hr tablet, TAKE 1 TABLET BY MOUTH EVERYDAY AT BEDTIME, Disp: 90 tablet, Rfl: 3   Polyethyl Glycol-Propyl Glycol (SYSTANE OP), Place 1 drop into both eyes daily as needed (DRY EYES)., Disp: , Rfl:   Imaging Review  Cervical Imaging: Cervical MR wo contrast: Results for orders placed during the hospital encounter of 02/24/21  MR CERVICAL SPINE WO CONTRAST  Narrative CLINICAL DATA:  Spondylosis without myelopathy or radiculopathy, cervical region.  EXAM: MRI CERVICAL SPINE WITHOUT CONTRAST  TECHNIQUE: Multiplanar, multisequence MR imaging of the cervical spine was performed. No intravenous contrast was administered.  COMPARISON:  MRI of the cervical spine August 17, 2020  FINDINGS: Alignment: Mild anterolisthesis at C3-4, C5-6, C6-7 and C7-T1.  Vertebrae: Interval improvement with mild residual marrow edema at C1 and C2 and C7-T1 right facet articular processes. No new areas of marrow edema identified. No evidence of discitis or aggressive bone lesion. Facet fusion and incomplete endplate fusion noted from C4 through C7. Endplate fusion is also noted at T1-2.  Cord: Normal signal and morphology.  Posterior Fossa, vertebral arteries, paraspinal tissues: Negative.  Disc levels:  C1-2: Increased thickness of the soft tissues posterior to the odontoid. Facet osteoarthritis resulting in narrowing of the bilateral neural foramen, left greater than right.  C2-3: Small posterior disc protrusion without significant spinal canal stenosis. Facet degenerative changes resulting in mild left neural foraminal narrowing.  C3-4: Small posterior disc protrusion without significant spinal canal stenosis. Uncovertebral and hypertrophic facet degenerative changes resulting in moderate right and mild left neural foraminal narrowing.  C4-5: Hypertrophic facet degenerative changes. No significant  spinal canal  or neural foraminal stenosis.  C5-6: Posterior disc osteophyte complex and facet ankylosis. No significant spinal canal or neural foraminal stenosis.  C6-7: Posterior disc osteophyte complex and facet ankylosis. No significant spinal canal or neural foraminal stenosis.  C7-T1: Posterior disc osteophyte complex and facet degenerative changes resulting in mild left neural foraminal narrowing.  IMPRESSION: Interval improvement of the marrow edema involving C1 and C2 and right C7-T1 facet joint with mild residual edema. Otherwise, stable spondylosis when compared to prior MRI.   Electronically Signed By: Katyucia  de Macedo Rodrigues M.D. On: 02/25/2021 16:20   Narrative CLINICAL DATA:  Posterior neck pain with limited range of motion x6 weeks.  EXAM: CERVICAL SPINE - 2-3 VIEW  COMPARISON:  None.  FINDINGS: There are 6 cervical type vertebral bodies visualized on the lateral view. There is no evidence of cervical spine fracture or prevertebral soft tissue swelling. Preserved cervical spine lordosis. Multilevel degenerative changes cervical spine with severe lower cervical disc space narrowing, facet and uncovertebral hypertrophy with grade 1 degenerative C3 on C4 anterolisthesis. Dental hardware.  IMPRESSION: No acute osseous abnormality. Multilevel cervical spine spondylosis, most prominent in the lower cervical spine.   Electronically Signed By: Tama Fails MD On: 07/09/2020 13:26   MR Lumbar Spine w/o contrast  Narrative CLINICAL DATA:  Low back pain getting worse over the past 8-10 weeks with no known injury.  EXAM: MRI LUMBAR SPINE WITHOUT CONTRAST  TECHNIQUE: Multiplanar, multisequence MR imaging of the lumbar spine was performed. No intravenous contrast was administered.  COMPARISON:  None.  FINDINGS: Segmentation:  Standard.  Alignment: No scoliosis of the lumbar spine. 2 mm retrolisthesis of L2 on L3. Grade 1 anterolisthesis  of L4 on L5 secondary to facet disease.  Vertebrae:  No fracture, evidence of discitis, or bone lesion.  Conus medullaris and cauda equina: Conus extends to the L1 level. Conus and cauda equina appear normal.  Paraspinal and other soft tissues: No acute paraspinal abnormality.  Disc levels:  Disc spaces: Degenerative disease with disc height loss throughout the lumbar spine with relative sparing at L4-5. Discogenic changes at L1-2.  T11-12: Mild broad-based disc bulge. Mild bilateral facet arthropathy. Mild right foraminal narrowing. No left foraminal narrowing.  T12-L1: Minimal broad-based disc bulge. No evidence of neural foraminal stenosis. No central canal stenosis.  L1-L2: Broad-based disc bulge. Mild bilateral facet arthropathy. Moderate right and mild left foraminal stenosis. Bilateral subarticular recess narrowing. No spinal stenosis.  L2-L3: Broad-based disc osteophyte complex. Moderate left and mild right facet arthropathy. Severe left subarticular recess stenosis. Severe left foraminal stenosis. Scratch them moderate left foraminal stenosis. No right foraminal stenosis. No central canal stenosis.  L3-L4: Partial ankylosis along the left side of the L3-4 disc with osseous ridging along the foraminal aspect. Mild bilateral facet arthropathy. Mild left foraminal stenosis. No right foraminal stenosis. No evidence of neural foraminal stenosis. No central canal stenosis. Left lateral recess stenosis.  L4-L5: Broad-based disc bulge. Severe bilateral facet arthropathy with ligamentum flavum infolding. Moderate spinal stenosis. Bilateral lateral recess stenosis. Mild bilateral foraminal stenosis.  L5-S1: Broad-based disc bulge. Moderate bilateral facet arthropathy. Mild spinal stenosis. Bilateral lateral recess stenosis. Moderate bilateral foraminal stenosis.  IMPRESSION: 1. Diffuse lumbar spine spondylosis as described above. 2. No acute osseous injury of the  lumbar spine.   Electronically Signed By: Onnie Bilis On: 10/18/2019 10:43  Lumbar MR wo contrast: No results found for this or any previous visit.  DG Lumbar Spine 2-3 Views  Narrative CLINICAL DATA:  Lumbar  and gluteal pain status post recent fall  EXAM: LUMBAR SPINE - 2-3 VIEW; SACRUM AND COCCYX - 2+ VIEW  COMPARISON:  Lumbar spine radiograph 09/24/2019  FINDINGS: Lumbar spine: Dextroconvex scoliosis of the lumbar spine. Diffuse osteopenia. Vertebral body heights appear maintained. Advanced degenerative changes of the facets. Atherosclerotic changes seen throughout visualized arterial segments.  Sacrum/coccyx: No fracture or dislocation.  Soft tissues are normal.  IMPRESSION: 1. No acute abnormality of the lumbar spine and sacrum/coccyx. 2. Evaluation for compression fracture of the lumbar spine is limited due to osteopenia as well as scoliotic curvature. If there is high clinical suspicion for lumbar compression fracture, further evaluation with CT or MRI should be considered.   Electronically Signed By: Elester Grim M.D. On: 06/02/2023 15:57   MR HIP RIGHT WO CONTRAST  Narrative CLINICAL DATA:  Right hip and groin pain with weakness and limited range of motion since falling 2 weeks ago. Osteonecrosis suspected.  EXAM: MR OF THE RIGHT HIP WITHOUT CONTRAST  TECHNIQUE: Multiplanar, multisequence MR imaging was performed. No intravenous contrast was administered.  COMPARISON:  Radiographs 04/04/2021  FINDINGS: Bones: There is no evidence of acute fracture, dislocation or avascular necrosis. The visualized bony pelvis appears normal. The visualized sacroiliac joints and symphysis pubis appear normal. 1.3 cm T2 hyperintense lesion in the left ischium (image 20/5) has a nonaggressive appearance. Lower lumbar spondylosis noted without evidence of high-grade foraminal narrowing.  Articular cartilage and labrum  Articular cartilage: Mild degenerative  changes of both hips. No focal subchondral signal abnormality.  Labrum: There is no gross labral tear or paralabral abnormality.  Joint or bursal effusion  Joint effusion: No significant hip joint effusion.  Bursae: Small amount of greater trochanteric bursal fluid, right greater than left.  Muscles and tendons  Muscles and tendons: Mild gluteus tendinosis bilaterally. In addition, there is asymmetric right common hamstring tendinosis and partial tearing at the ischial origin. The visualized iliopsoas tendons appear intact. No focal muscular atrophy identified. The piriformis muscles appear symmetric.  Other findings  Miscellaneous: The visualized internal pelvic contents appear unremarkable status post hysterectomy.  IMPRESSION: 1. No acute osseous findings or evidence of femoral head avascular necrosis. Mild degenerative changes of both hips. 2. Asymmetric common hamstring tendinosis and partial tearing on the right. Mild gluteus tendinosis and greater trochanteric bursal fluid bilaterally. 3. Mild lower lumbar spondylosis.   Electronically Signed By: Elmon Hagedorn M.D. On: 04/11/2021 11:50  DG HIP UNILAT W OR W/O PELVIS 2-3 VIEWS RIGHT  Narrative CLINICAL DATA:  Right hip pain with limited range of motion. Pain for months, worse after fall 1 month ago.  EXAM: DG HIP (WITH OR WITHOUT PELVIS) 2-3V RIGHT  COMPARISON:  None.  FINDINGS: There right hip joint space is preserved. There is mild lateral acetabular spurring. No acute or healing fracture. Femoral head is well seated. No evidence of avascular necrosis or erosive change. Pubic rami and remainder of the bony pelvis are intact. No visualized focal bone lesion or bone destruction. There is mild enthesopathic change about both greater trochanters. Pubic symphysis and sacroiliac joints are congruent.  IMPRESSION: Mild osteoarthritis of the right hip.   Electronically Signed By: Chadwick Colonel  M.D. On: 04/06/2021 11:52    Complexity Note: Imaging results reviewed.                         ROS  Cardiovascular: cardiac ablation 10/15/2021 Pulmonary or Respiratory: Snoring , Coughing up mucus (Bronchitis), and Temporary stoppage  of breathing during sleep Neurological: Curved spine Psychological-Psychiatric: Anxiousness and Depressed Gastrointestinal: Reflux or heatburn Genitourinary: No reported renal or genitourinary signs or symptoms such as difficulty voiding or producing urine, peeing blood, non-functioning kidney, kidney stones, difficulty emptying the bladder, difficulty controlling the flow of urine, or chronic kidney disease Hematological: No reported hematological signs or symptoms such as prolonged bleeding, low or poor functioning platelets, bruising or bleeding easily, hereditary bleeding problems, low energy levels due to low hemoglobin or being anemic Endocrine: thyroid  disease/Graves disease 12/2014 Rheumatologic: Joint aches and or swelling due to excess weight (Osteoarthritis) Musculoskeletal: Negative for myasthenia gravis, muscular dystrophy, multiple sclerosis or malignant hyperthermia Work History: Retired  Allergies  Martha Gilmore is allergic to aspirin, demerol  [meperidine hcl], nsaids, penicillins, statins, cephalexin, clindamycin/lincomycin, diclofenac , gabapentin , and tape.  Laboratory Chemistry Profile   Renal Lab Results  Component Value Date   BUN 14 05/08/2023   CREATININE 0.79 05/08/2023   BCR 18 05/08/2023   GFR 57.58 (L) 05/02/2021   SPECGRAV 1.020 04/16/2017   PHUR 6.0 04/16/2017   PROTEINUR negative 04/16/2017     Electrolytes Lab Results  Component Value Date   NA 142 05/08/2023   K 4.5 05/08/2023   CL 103 05/08/2023   CALCIUM  9.7 05/08/2023   MG 2.1 04/04/2021     Hepatic Lab Results  Component Value Date   AST 16 05/08/2023   ALT 11 05/08/2023   ALBUMIN 4.0 05/08/2023   ALKPHOS 143 (H) 05/08/2023     ID No results found  for: "LYMEIGGIGMAB", "HIV", "SARSCOV2NAA", "STAPHAUREUS", "MRSAPCR", "HCVAB", "PREGTESTUR", "RMSFIGG", "QFVRPH1IGG", "QFVRPH2IGG"   Bone Lab Results  Component Value Date   VD25OH 71.5 03/28/2023     Endocrine Lab Results  Component Value Date   GLUCOSE 71 05/08/2023   HGBA1C 5.5 05/08/2023   TSH 0.94 05/03/2022     Neuropathy Lab Results  Component Value Date   VITAMINB12 675 09/25/2022   HGBA1C 5.5 05/08/2023     CNS No results found for: "COLORCSF", "APPEARCSF", "RBCCOUNTCSF", "WBCCSF", "POLYSCSF", "LYMPHSCSF", "EOSCSF", "PROTEINCSF", "GLUCCSF", "JCVIRUS", "CSFOLI", "IGGCSF", "LABACHR", "ACETBL"   Inflammation (CRP: Acute  ESR: Chronic) Lab Results  Component Value Date   ESRSEDRATE 22 09/27/2020     Rheumatology Lab Results  Component Value Date   RF <14 09/27/2020   ANA POSITIVE (A) 09/27/2020   HLAB27 NEGATIVE 09/27/2020     Coagulation Lab Results  Component Value Date   PLT 275 05/08/2023     Cardiovascular Lab Results  Component Value Date   HGB 13.1 05/08/2023   HCT 41.5 05/08/2023     Screening No results found for: "SARSCOV2NAA", "COVIDSOURCE", "STAPHAUREUS", "MRSAPCR", "HCVAB", "HIV", "PREGTESTUR"   Cancer No results found for: "CEA", "CA125", "LABCA2"   Allergens No results found for: "ALMOND", "APPLE", "ASPARAGUS", "AVOCADO", "BANANA", "BARLEY", "BASIL", "BAYLEAF", "GREENBEAN", "LIMABEAN", "WHITEBEAN", "BEEFIGE", "REDBEET", "BLUEBERRY", "BROCCOLI", "CABBAGE", "MELON", "CARROT", "CASEIN", "CASHEWNUT", "CAULIFLOWER", "CELERY"     Note: Lab results reviewed.  PFSH  Drug: Martha Gilmore  reports no history of drug use. Alcohol:  reports no history of alcohol use. Tobacco:  reports that she has never smoked. She has been exposed to tobacco smoke. She has never used smokeless tobacco. Medical:  has a past medical history of Allergy, Aortic insufficiency, Arthritis, Atrial flutter (HCC) (12/30/2020), Cataracts, bilateral (09/23/2015), Clostridium  difficile infection (04/16/2017), Depression, Encounter for annual physical exam (05/09/2023), GERD (gastroesophageal reflux disease), Heart murmur, Hyperlipidemia, OAB (overactive bladder) (12/16/2013), Osteopenia (08/05/2012), Other fatigue (09/25/2022), Seasonal allergies, Sleep apnea, Thyroid  disease, and  Urge and stress incontinence. Family: family history includes Arthritis in her mother and sister; Brain cancer in her brother; Cancer in her daughter, father, sister, and sister; Diabetes in her sister; Early death in her sister; Heart disease in her father, maternal grandmother, mother, and sister; Hypertension in her mother; Kidney cancer in her sister; Lung cancer in her sister; Miscarriages / Stillbirths in her mother; Obesity in her father, mother, and sister.  Past Surgical History:  Procedure Laterality Date   A-FLUTTER ABLATION N/A 10/14/2021   Procedure: A-FLUTTER ABLATION;  Surgeon: Lei Pump, MD;  Location: [facility];  Service: Cardiovascular;  Laterality: N/A;   APPENDECTOMY  1971   CARDIOVERSION N/A 04/13/2021   Procedure: CARDIOVERSION;  Surgeon: Jann Melody, MD;  Location: [facility] ENDOSCOPY;  Service: Cardiovascular;  Laterality: N/A;   CESAREAN SECTION     CHOLECYSTECTOMY     EYE SURGERY  cataracts   Teeth Implants     TRIGGER FINGER RELEASE     VAGINAL HYSTERECTOMY     Active Ambulatory Problems    Diagnosis Date Noted   Bilateral sensorineural hearing loss 02/06/2016   DDD (degenerative disc disease), lumbosacral 10/22/2010   GERD (gastroesophageal reflux disease) 12/13/2012   History of Graves' disease 03/22/2015   Major depression, recurrent, chronic (HCC) 04/16/2017   Mixed hyperlipidemia 04/16/2017   Osteoarthritis of CMC joint of thumb 08/13/2012   Osteopenia 08/05/2012   Postablative hypothyroidism 12/20/2015   Seasonal allergies 08/13/2012   Urge and stress incontinence 08/13/2012   OAB (overactive bladder) 12/16/2013    Clostridium difficile infection 04/16/2017   Family history of early CAD 04/16/2017   Chondromalacia, knee, right 11/05/2017   Tricompartment osteoarthritis of right knee 11/05/2017   Atrial flutter (HCC) 12/30/2020   Aortic insufficiency 01/18/2021   Obstructive sleep apnea 03/28/2021   Long-term use of high-risk medication 04/07/2021   Nonrheumatic aortic valve insufficiency 04/07/2021   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) 05/02/2021   Hypertension 07/04/2021   Chronic neck pain 12/14/2021   Arthropathy of cervical facet joint 12/14/2021   Spondylosis, cervical 12/14/2021   Paresthesia of hand, bilateral 10/22/2022   Balance problems 10/22/2022   Dyspnea on exertion 10/22/2022   Arthritis 10/22/2022   Encounter for annual physical exam 05/09/2023   Prediabetes 05/09/2023   Acquired hypothyroidism 05/09/2023   Vitamin D  deficiency 05/09/2023   Fall 05/29/2023   Resolved Ambulatory Problems    Diagnosis Date Noted   Benign essential hypertension 10/22/2010   Facet degeneration of lumbar region 01/06/2020   Positive ANA (antinuclear antibody) 10/26/2020   Cervical spine ankylosis 10/26/2020   Cataracts, bilateral 09/23/2015   Obesity (BMI 30-39.9) 07/04/2021   Current use of long term anticoagulation 08/19/2021   Postural dizziness 03/07/2022   Other fatigue 09/25/2022   Past Medical History:  Diagnosis Date   Allergy    Depression    Heart murmur    Hyperlipidemia    Sleep apnea    Thyroid  disease    Constitutional Exam  General appearance: Well nourished, well developed, and well hydrated. In no apparent acute distress Vitals:   09/27/23 0850  BP: 114/64  Pulse: 69  Resp: 16  Temp: (!) 97.1 F (36.2 C)  SpO2: 99%  Weight: 159 lb (72.1 kg)  Height: 5\' 1"  (1.549 m)   BMI Assessment: Estimated body mass index is 30.04 kg/m as calculated from the following:   Height as of this encounter: 5\' 1"  (1.549 m).   Weight as  of this  encounter: 159 lb (72.1 kg).  BMI interpretation table: BMI level Category Range association with higher incidence of chronic pain  <18 kg/m2 Underweight   18.5-24.9 kg/m2 Ideal body weight   25-29.9 kg/m2 Overweight Increased incidence by 20%  30-34.9 kg/m2 Obese (Class I) Increased incidence by 68%  35-39.9 kg/m2 Severe obesity (Class II) Increased incidence by 136%  >40 kg/m2 Extreme obesity (Class III) Increased incidence by 254%   Patient's current BMI Ideal Body weight  Body mass index is 30.04 kg/m. Ideal body weight: 47.8 kg (105 lb 6.1 oz) Adjusted ideal body weight: 57.5 kg (126 lb 13.2 oz)   BMI Readings from Last 4 Encounters:  09/27/23 30.04 kg/m  07/20/23 30.80 kg/m  06/22/23 30.80 kg/m  05/29/23 31.29 kg/m   Wt Readings from Last 4 Encounters:  09/27/23 159 lb (72.1 kg)  07/20/23 163 lb (73.9 kg)  06/22/23 163 lb (73.9 kg)  05/29/23 165 lb 9.6 oz (75.1 kg)    Psych/Mental status: Alert, oriented x 3 (person, place, & time)       Eyes: PERLA Respiratory: No evidence of acute respiratory distress  Gait & Posture Assessment  Ambulation: Unassisted Gait: Antalgic gait (limping) Posture: Difficulty standing up straight, due to pain  Lower Extremity Exam    Side: Right lower extremity  Side: Left lower extremity  Stability: No instability observed          Stability: No instability observed          Skin & Extremity Inspection: Skin color, temperature, and hair growth are WNL. No peripheral edema or cyanosis. No masses, redness, swelling, asymmetry, or associated skin lesions. No contractures.  Skin & Extremity Inspection: Skin color, temperature, and hair growth are WNL. No peripheral edema or cyanosis. No masses, redness, swelling, asymmetry, or associated skin lesions. No contractures.  Functional ROM: Pain restricted ROM for all joints of the lower extremity          Functional ROM: Unrestricted ROM                  Muscle Tone/Strength:  Functionally intact. No obvious neuro-muscular anomalies detected.  Muscle Tone/Strength: Functionally intact. No obvious neuro-muscular anomalies detected.  Sensory (Neurological): Arthropathic arthralgia        Sensory (Neurological): Unimpaired        DTR: Patellar: deferred today Achilles: deferred today Plantar: deferred today  DTR: Patellar: deferred today Achilles: deferred today Plantar: deferred today  Palpation: No palpable anomalies  Palpation: No palpable anomalies    Assessment  Primary Diagnosis & Pertinent Problem List: The primary encounter diagnosis was Primary osteoarthritis of right knee. A diagnosis of Chronic pain of right knee was also pertinent to this visit.  Visit Diagnosis (New problems to examiner): 1. Primary osteoarthritis of right knee   2. Chronic pain of right knee    Plan of Care (Initial workup plan)  Assessment and Plan    Knee pain   Chronic right knee pain persists despite previous treatments including physical therapy, steroid injections, and gel injections. She is interested in a genicular nerve block, with potential ablation if successful. She has had successful lumbar ablations previously. Schedule a genicular nerve block for the right knee in approximately two weeks. Administer Valium orally for relaxation during the procedure. Instruct her to bring a driver and fast the night before. Discuss the potential for ablation if the nerve block is successful. Ensure the procedure is preapproved by insurance.  Atrial fibrillation   Atrial fibrillation is present. She  is not currently on anticoagulation therapy.  Graves' disease   Graves' disease is noted.        Procedure Orders         GENICULAR NERVE BLOCK    Provider-requested follow-up: Return in about 20 days (around 10/17/2023) for Right GNB , in clinic (PO Valium 5mg ).  Future Appointments  Date Time Provider Department Center  10/30/2023  9:30 AM [facility]-ANNUAL WELLNESS VISIT [facility]   05/13/2024  9:45 AM Early, Adriane Albe, NP [facility]   I discussed the assessment and treatment plan with the patient. The patient was provided an opportunity to ask questions and all were answered. The patient agreed with the plan and demonstrated an understanding of the instructions.  Patient advised to call back or seek an in-person evaluation if the symptoms or condition worsens.  Duration of encounter: .  Total time on encounter, as per AMA guidelines included both the face-to-face and non-face-to-face time personally spent by the physician and/or other qualified health care professional(s) on the day of the encounter (includes time in activities that require the physician or other qualified health care professional and does not include time in activities normally performed by clinical staff). Physician's time may include the following activities when performed: Preparing to see the patient (e.g., pre-charting review of records, searching for previously ordered imaging, lab work, and nerve conduction tests) Review of prior analgesic pharmacotherapies. Reviewing PMP Interpreting ordered tests (e.g., lab work, imaging, nerve conduction tests) Performing post-procedure evaluations, including interpretation of diagnostic procedures Obtaining and/or reviewing separately obtained history Performing a medically appropriate examination and/or evaluation Counseling and educating the patient/family/caregiver Ordering medications, tests, or procedures Referring and communicating with other health care professionals (when not separately reported) Documenting clinical information in the electronic or other health record Independently interpreting results (not separately reported) and communicating results to the patient/ family/caregiver Care coordination (not separately reported)  Note by: Cephus Collin, MD (TTS and AI technology used. I apologize for any typographical errors that were not  detected and corrected.) Date: 09/27/2023; Time: 9:21 AM

## 2023-09-27 NOTE — Patient Instructions (Signed)
 GENERAL RISKS AND COMPLICATIONS  What are the risk, side effects and possible complications? Generally speaking, most procedures are safe.  However, with any procedure there are risks, side effects, and the possibility of complications.  The risks and complications are dependent upon the sites that are lesioned, or the type of nerve block to be performed.  The closer the procedure is to the spine, the more serious the risks are.  Great care is taken when placing the radio frequency needles, block needles or lesioning probes, but sometimes complications can occur. Infection: Any time there is an injection through the skin, there is a risk of infection.  This is why sterile conditions are used for these blocks.  There are four possible types of infection. Localized skin infection. Central Nervous System Infection-This can be in the form of Meningitis, which can be deadly. Epidural Infections-This can be in the form of an epidural abscess, which can cause pressure inside of the spine, causing compression of the spinal cord with subsequent paralysis. This would require an emergency surgery to decompress, and there are no guarantees that the patient would recover from the paralysis. Discitis-This is an infection of the intervertebral discs.  It occurs in about 1% of discography procedures.  It is difficult to treat and it may lead to surgery.        2. Pain: the needles have to go through skin and soft tissues, will cause soreness.       3. Damage to internal structures:  The nerves to be lesioned may be near blood vessels or    other nerves which can be potentially damaged.       4. Bleeding: Bleeding is more common if the patient is taking blood thinners such as  aspirin, Coumadin, Ticiid, Plavix, etc., or if he/she have some genetic predisposition  such as hemophilia. Bleeding into the spinal canal can cause compression of the spinal  cord with subsequent paralysis.  This would require an emergency  surgery to  decompress and there are no guarantees that the patient would recover from the  paralysis.       5. Pneumothorax:  Puncturing of a lung is a possibility, every time a needle is introduced in  the area of the chest or upper back.  Pneumothorax refers to free air around the  collapsed lung(s), inside of the thoracic cavity (chest cavity).  Another two possible  complications related to a similar event would include: Hemothorax and Chylothorax.   These are variations of the Pneumothorax, where instead of air around the collapsed  lung(s), you may have blood or chyle, respectively.       6. Spinal headaches: They may occur with any procedures in the area of the spine.       7. Persistent CSF (Cerebro-Spinal Fluid) leakage: This is a rare problem, but may occur  with prolonged intrathecal or epidural catheters either due to the formation of a fistulous  track or a dural tear.       8. Nerve damage: By working so close to the spinal cord, there is always a possibility of  nerve damage, which could be as serious as a permanent spinal cord injury with  paralysis.       9. Death:  Although rare, severe deadly allergic reactions known as "Anaphylactic  reaction" can occur to any of the medications used.      10. Worsening of the symptoms:  We can always make thing worse.  What are the chances  of something like this happening? Chances of any of this occuring are extremely low.  By statistics, you have more of a chance of getting killed in a motor vehicle accident: while driving to the hospital than any of the above occurring .  Nevertheless, you should be aware that they are possibilities.  In general, it is similar to taking a shower.  Everybody knows that you can slip, hit your head and get killed.  Does that mean that you should not shower again?  Nevertheless always keep in mind that statistics do not mean anything if you happen to be on the wrong side of them.  Even if a procedure has a 1 (one) in a  1,000,000 (million) chance of going wrong, it you happen to be that one..Also, keep in mind that by statistics, you have more of a chance of having something go wrong when taking medications.  Who should not have this procedure? If you are on a blood thinning medication (e.g. Coumadin, Plavix, see list of "Blood Thinners"), or if you have an active infection going on, you should not have the procedure.  If you are taking any blood thinners, please inform your physician.  How should I prepare for this procedure? Do not eat or drink anything at least six hours prior to the procedure. Bring a driver with you .  It cannot be a taxi. Come accompanied by an adult that can drive you back, and that is strong enough to help you if your legs get weak or numb from the local anesthetic. Take all of your medicines the morning of the procedure with just enough water to swallow them. If you have diabetes, make sure that you are scheduled to have your procedure done first thing in the morning, whenever possible. If you have diabetes, take only half of your insulin dose and notify our nurse that you have done so as soon as you arrive at the clinic. If you are diabetic, but only take blood sugar pills (oral hypoglycemic), then do not take them on the morning of your procedure.  You may take them after you have had the procedure. Do not take aspirin or any aspirin-containing medications, at least eleven (11) days prior to the procedure.  They may prolong bleeding. Wear loose fitting clothing that may be easy to take off and that you would not mind if it got stained with Betadine or blood. Do not wear any jewelry or perfume Remove any nail coloring.  It will interfere with some of our monitoring equipment.  NOTE: Remember that this is not meant to be interpreted as a complete list of all possible complications.  Unforeseen problems may occur.  BLOOD THINNERS The following drugs contain aspirin or other products,  which can cause increased bleeding during surgery and should not be taken for 2 weeks prior to and 1 week after surgery.  If you should need take something for relief of minor pain, you may take acetaminophen which is found in Tylenol,m Datril, Anacin-3 and Panadol. It is not blood thinner. The products listed below are.  Do not take any of the products listed below in addition to any listed on your instruction sheet.  A.P.C or A.P.C with Codeine Codeine Phosphate Capsules #3 Ibuprofen Ridaura  ABC compound Congesprin Imuran rimadil  Advil Cope Indocin Robaxisal  Alka-Seltzer Effervescent Pain Reliever and Antacid Coricidin or Coricidin-D  Indomethacin Rufen  Alka-Seltzer plus Cold Medicine Cosprin Ketoprofen S-A-C Tablets  Anacin Analgesic Tablets or Capsules Coumadin  Korlgesic Salflex  Anacin Extra Strength Analgesic tablets or capsules CP-2 Tablets Lanoril Salicylate  Anaprox Cuprimine Capsules Levenox Salocol  Anexsia-D Dalteparin Magan Salsalate  Anodynos Darvon compound Magnesium Salicylate Sine-off  Ansaid Dasin Capsules Magsal Sodium Salicylate  Anturane Depen Capsules Marnal Soma  APF Arthritis pain formula Dewitt's Pills Measurin Stanback  Argesic Dia-Gesic Meclofenamic Sulfinpyrazone  Arthritis Bayer Timed Release Aspirin Diclofenac Meclomen Sulindac  Arthritis pain formula Anacin Dicumarol Medipren Supac  Analgesic (Safety coated) Arthralgen Diffunasal Mefanamic Suprofen  Arthritis Strength Bufferin Dihydrocodeine Mepro Compound Suprol  Arthropan liquid Dopirydamole Methcarbomol with Aspirin Synalgos  ASA tablets/Enseals Disalcid Micrainin Tagament  Ascriptin Doan's Midol Talwin  Ascriptin A/D Dolene Mobidin Tanderil  Ascriptin Extra Strength Dolobid Moblgesic Ticlid  Ascriptin with Codeine Doloprin or Doloprin with Codeine Momentum Tolectin  Asperbuf Duoprin Mono-gesic Trendar  Aspergum Duradyne Motrin or Motrin IB Triminicin  Aspirin plain, buffered or enteric coated  Durasal Myochrisine Trigesic  Aspirin Suppositories Easprin Nalfon Trillsate  Aspirin with Codeine Ecotrin Regular or Extra Strength Naprosyn Uracel  Atromid-S Efficin Naproxen Ursinus  Auranofin Capsules Elmiron Neocylate Vanquish  Axotal Emagrin Norgesic Verin  Azathioprine Empirin or Empirin with Codeine Normiflo Vitamin E  Azolid Emprazil Nuprin Voltaren  Bayer Aspirin plain, buffered or children's or timed BC Tablets or powders Encaprin Orgaran Warfarin Sodium  Buff-a-Comp Enoxaparin Orudis Zorpin  Buff-a-Comp with Codeine Equegesic Os-Cal-Gesic   Buffaprin Excedrin plain, buffered or Extra Strength Oxalid   Bufferin Arthritis Strength Feldene Oxphenbutazone   Bufferin plain or Extra Strength Feldene Capsules Oxycodone with Aspirin   Bufferin with Codeine Fenoprofen Fenoprofen Pabalate or Pabalate-SF   Buffets II Flogesic Panagesic   Buffinol plain or Extra Strength Florinal or Florinal with Codeine Panwarfarin   Buf-Tabs Flurbiprofen Penicillamine   Butalbital Compound Four-way cold tablets Penicillin   Butazolidin Fragmin Pepto-Bismol   Carbenicillin Geminisyn Percodan   Carna Arthritis Reliever Geopen Persantine   Carprofen Gold's salt Persistin   Chloramphenicol Goody's Phenylbutazone   Chloromycetin Haltrain Piroxlcam   Clmetidine heparin Plaquenil   Cllnoril Hyco-pap Ponstel   Clofibrate Hydroxy chloroquine Propoxyphen         Before stopping any of these medications, be sure to consult the physician who ordered them.  Some, such as Coumadin (Warfarin) are ordered to prevent or treat serious conditions such as "deep thrombosis", "pumonary embolisms", and other heart problems.  The amount of time that you may need off of the medication may also vary with the medication and the reason for which you were taking it.  If you are taking any of these medications, please make sure you notify your pain physician before you undergo any procedures.         Moderate Conscious  Sedation, Adult Sedation is the use of medicines to help you relax and not feel pain. Moderate conscious sedation is a type of sedation that makes you less alert than normal. You are still able to respond to instructions, touch, or both. This type of sedation is used during short medical and dental procedures. It is milder than deep sedation, which is a type of sedation you cannot be easily woken up from. It is also milder than general anesthesia, which is the use of medicines to make you fall asleep. Moderate conscious sedation lets you return to your normal activities sooner. Tell a health care provider about: Any allergies you have. All medicines you are taking, including vitamins, herbs, steroids, eye drops, creams, and over-the-counter medicines. Any problems you or family members have had with anesthesia.  Any bleeding problems you have. Any surgeries you have had. Any medical conditions you have. Whether you are pregnant or may be pregnant. Any recent alcohol, tobacco, or drug use. What are the risks? Your health care provider will talk with you about risks. These may include: Oversedation. This is when you get too much medicine. Nausea or vomiting. Allergic reaction to medicines. Trouble breathing. If this happens, a breathing tube may be used. It will be removed when you can breathe better on your own. Heart trouble. Lung trouble. Emergence delirium. This is when you feel confused while the sedation wears off. This gets better with time. What happens before the procedure? When to stop eating and drinking Follow instructions from your health care provider about what you may eat and drink. These may include: 8 hours before your procedure Stop eating most foods. Do not eat meat, fried foods, or fatty foods. Eat only light foods, such as toast or crackers. All liquids are okay except energy drinks and alcohol. 6 hours before your procedure Stop eating. Drink only clear liquids, such  as water, clear fruit juice, black coffee, plain tea, and sports drinks. Do not drink energy drinks or alcohol. 2 hours before your procedure Stop drinking all liquids. You may be allowed to take medicines with small sips of water. If you do not follow your health care provider's instructions, your procedure may be delayed or canceled. Medicines Ask your health care provider about: Changing or stopping your regular medicines. These include any diabetes medicines or blood thinners you take. Taking medicines such as aspirin and ibuprofen. These medicines can thin your blood. Do not take them unless your health care provider tells you to. Taking over-the-counter medicines, vitamins, herbs, and supplements. Tests and exams You may have an exam or testing. You may have a blood or urine sample taken. General instructions Do not use any products that contain nicotine or tobacco for at least 4 weeks before the procedure. These products include cigarettes, chewing tobacco, and vaping devices, such as e-cigarettes. If you need help quitting, ask your health care provider. If you will be going home right after the procedure, plan to have a responsible adult: Take you home from the hospital or clinic. You will not be allowed to drive. Care for you for the time you are told. What happens during the procedure?  You will be given the sedative. It may be given: As a pill you can take by mouth. It can also be put into the rectum. As a spray through the nose. As an injection into muscle. As an injection into a vein through an IV. You may be given oxygen as needed. Your blood pressure, heart rate, breathing rate, and blood oxygen level will be monitored during the procedure. The medical or dental procedure will be done. The procedure may vary among health care providers and hospitals. What happens after the procedure? Your blood pressure, heart rate, breathing rate, and blood oxygen level will be  monitored until you leave the hospital or clinic. You will get fluids through an IV as needed. Do not drive or operate machinery until your health care provider says that it is safe. This information is not intended to replace advice given to you by your health care provider. Make sure you discuss any questions you have with your health care provider. Document Revised: 10/24/2021 Document Reviewed: 10/24/2021 Elsevier Patient Education  2024 [employer] Inc.Selective Nerve Root Block Patient Information  Description: Specific nerve roots exit the spinal canal  and these nerves can be compressed and inflamed by a bulging disc and bone spurs.  By injecting steroids on the nerve root, we can potentially decrease the inflammation surrounding these nerves, which often leads to decreased pain.  Also, by injecting local anesthesia on the nerve root, this can provide [place] helpful information to give to your referring doctor if it decreases your pain.  Selective nerve root blocks can be done along the spine from the neck to the low back depending on the location of your pain.   After numbing the skin with local anesthesia, a small needle is passed to the nerve root and the position of the needle is verified using x-ray pictures.  After the needle is in correct position, we then deposit the medication.  You may experience a pressure sensation while this is being done.  The entire block usually lasts less than 15 minutes.  Conditions that may be treated with selective nerve root blocks: Low back and leg pain Spinal stenosis Diagnostic block prior to potential surgery Neck and arm pain Post laminectomy syndrome  Preparation for the injection:  Do not eat any solid food or dairy products within 8 hours of your appointment. You may drink clear liquids up to 3 hours before an appointment.  Clear liquids include water, black coffee, juice or soda.  No milk or cream please. You may take your regular medications,  including pain medications, with a sip of water before your appointment.  Diabetics should hold regular insulin (if taken separately) and take 1/2 normal NPH dose the morning of the procedure.  Carry some sugar containing items with you to your appointment. A driver must accompany you and be prepared to drive you home after your procedure. Bring all your current medications with you. An IV may be inserted and sedation may be given at the discretion of the physician. A blood pressure cuff, EKG, and other monitors will often be applied during the procedure.  Some patients may need to have extra oxygen administered for a short period. You will be asked to provide medical information, including allergies, prior to the procedure.  We must know immediately if you are taking blood  Thinners (like Coumadin) or if you are allergic to IV iodine contrast (dye).  Possible side-effects: All are usually temporary Bleeding from needle site Light headedness Numbness and tingling Decreased blood pressure Weakness in arms/legs Pressure sensation in back/neck Pain at injection site (several days)  Possible complications: All are extremely rare Infection Nerve injury Spinal headache (a headache wore with upright position)  Call if you experience: Fever/chills associated with headache or increased back/neck pain Headache worsened by an upright position New onset weakness or numbness of an extremity below the injection site Hives or difficulty breathing (go to the emergency room) Inflammation or drainage at the injection site(s) Severe back/neck pain greater than usual New symptoms which are concerning to you  Please note:  Although the local anesthetic injected can often make your back or neck feel good for several hours after the injection the pain will likely return.  It takes 3-5 days for steroids to work on the nerve root. You may not notice any pain relief for at least one week.  If effective,  we will often do a series of 3 injections spaced 3-6 weeks apart to maximally decrease your pain.    If you have any questions, please call [phone] [facility]

## 2023-10-12 ENCOUNTER — Telehealth: Payer: Self-pay

## 2023-10-12 NOTE — Telephone Encounter (Signed)
 Appt scheduled 7/28, pt offered earlier appt with another provider, declined but will call back if she changes her mind

## 2023-10-12 NOTE — Telephone Encounter (Signed)
 Please schedule the pt.   Copied from CRM [id number]. Topic: Appointments - Appointment Scheduling >> Oct 12, 2023  1:02 PM Hobson Luna F wrote: Patient is calling in because she is having shoulder pain and wanted to schedule an appointment. Earliest showing is August 19th, patient does not want to wait until August and asked was there anything sooner or a way she could be squeezed in. Please advise. Patient says she has had the pain for a few weeks.

## 2023-10-17 ENCOUNTER — Ambulatory Visit: Admitting: Student in an Organized Health Care Education/Training Program

## 2023-10-23 ENCOUNTER — Other Ambulatory Visit: Payer: Self-pay | Admitting: Nurse Practitioner

## 2023-10-24 ENCOUNTER — Encounter: Payer: Self-pay | Admitting: Student in an Organized Health Care Education/Training Program

## 2023-10-24 ENCOUNTER — Ambulatory Visit
Admission: RE | Admit: 2023-10-24 | Discharge: 2023-10-24 | Disposition: A | Discharge status: Home / Self Care | Source: Ambulatory Visit | Attending: Student in an Organized Health Care Education/Training Program | Admitting: Student in an Organized Health Care Education/Training Program

## 2023-10-24 ENCOUNTER — Ambulatory Visit (HOSPITAL_BASED_OUTPATIENT_CLINIC_OR_DEPARTMENT_OTHER): Admitting: Student in an Organized Health Care Education/Training Program

## 2023-10-24 VITALS — BP 117/64 | HR 71 | Temp 97.5°F | Resp 14 | Ht 61.0 in | Wt 160.0 lb

## 2023-10-24 DIAGNOSIS — G8929 Other chronic pain: Secondary | ICD-10-CM | POA: Diagnosis present

## 2023-10-24 DIAGNOSIS — M25561 Pain in right knee: Secondary | ICD-10-CM | POA: Diagnosis not present

## 2023-10-24 DIAGNOSIS — M1711 Unilateral primary osteoarthritis, right knee: Secondary | ICD-10-CM

## 2023-10-24 MED ORDER — LIDOCAINE HCL (PF) 2 % IJ SOLN
INTRAMUSCULAR | Status: AC
  Filled 2023-10-24: qty 10

## 2023-10-24 MED ORDER — ROPIVACAINE HCL 2 MG/ML IJ SOLN
INTRAMUSCULAR | Status: AC
  Filled 2023-10-24: qty 20

## 2023-10-24 MED ORDER — ROPIVACAINE HCL 2 MG/ML IJ SOLN
9.0000 mL | Freq: Once | INTRAMUSCULAR | Status: AC
  Administered 2023-10-24: 9 mL via PERINEURAL

## 2023-10-24 MED ORDER — DEXAMETHASONE SODIUM PHOSPHATE 10 MG/ML IJ SOLN
10.0000 mg | Freq: Once | INTRAMUSCULAR | Status: AC
  Administered 2023-10-24: 10 mg

## 2023-10-24 MED ORDER — LIDOCAINE HCL 2 % IJ SOLN
20.0000 mL | Freq: Once | INTRAMUSCULAR | Status: AC
  Administered 2023-10-24: 200 mg

## 2023-10-24 MED ORDER — DEXAMETHASONE SODIUM PHOSPHATE 10 MG/ML IJ SOLN
INTRAMUSCULAR | Status: AC
  Filled 2023-10-24: qty 1

## 2023-10-24 NOTE — Progress Notes (Signed)
 PROVIDER NOTE: Interpretation of information contained herein should be left to medically-trained personnel. Specific patient instructions are provided elsewhere under Patient Instructions section of medical record. This document was created in part using STT-dictation technology, any transcriptional errors that may result from this process are unintentional.  Patient: Martha Gilmore Type: Established DOB: 12-06-1940 MRN: [id number] PCP: Oris Camie BRAVO, NP  Service: Procedure DOS: 10/24/2023 Setting: Ambulatory Location: Ambulatory outpatient facility Delivery: Face-to-face Provider: Wallie Sherry, MD Specialty: Interventional Pain Management Specialty designation: 09 Location: Outpatient facility Ref. Prov.: Early, Camie BRAVO, NP       Interventional Therapy   Procedure:          Type: Genicular Nerves Block (Superolateral, Superomedial, and Inferomedial Genicular Nerves)  #1  Laterality: Right (-RT)  Level: Superior and inferior to the knee joint.  Imaging: Fluoroscopic guidance Anesthesia: Local anesthesia (1-2% Lidocaine ) DOS: 10/24/2023  Performed by: Wallie Sherry, MD  Purpose: Diagnostic/Therapeutic Indications: Chronic knee pain severe enough to impact quality of life or function. Rationale (medical necessity): procedure needed and proper for the diagnosis and/or treatment of Martha Gilmore's medical symptoms and needs. No diagnosis found. NAS-11 Pain score:   Pre-procedure: 8 /10   Post-procedure: 2 /10     Target: For Genicular Nerve block(s), the targets are: the superolateral genicular nerve, located in the lateral distal portion of the femoral shaft as it curves to form the lateral epicondyle, in the region of the distal femoral metaphysis; the superomedial genicular nerve, located in the medial distal portion of the femoral shaft as it curves to form the medial epicondyle; and the inferomedial genicular nerve, located in the medial, proximal portion of the tibial shaft, as it  curves to form the medial epicondyle, in the region of the proximal tibial metaphysis.  Location: Superolateral, Superomedial, and Inferomedial aspects of knee joint.  Region: Lateral, Anterior, and Medial aspects of the knee joint, above and below the knee joint proper. Approach: Percutaneous  Type of procedure: Percutaneous perineural nerve block. The genicular nerve block is a motor-sparing technique that anesthetizes the sensory terminal branches innervating the knee joint, resulting in anesthesia of the anterior compartment of the knee. The distribution of anesthesia of each nerve is mostly in the corresponding quadrant.  Neuroanatomy: The superolateral genicular nerve (SLGN) courses around the femur shaft to pass between the vastus lateralis and the lateral epicondyle. It accompanies the superior lateral genicular artery. The superomedial genicular nerve (SMGN) courses around the femur shaft, following the superior medial genicular artery, to pass between the adductor magnus tendon and the medial epicondyle below the vastus medialis. The inferolateral genicular nerve (ILGN) courses around the tibial lateral epicondyle deep to the lateral collateral ligament, following the inferior lateral genicular artery, superior of the fibula head. The inferomedial genicular nerve (IMGN) courses horizontally below the medial collateral ligament between the tibial medial epicondyle and the insertion of the collateral ligament. It accompanies the inferior medial genicular artery. The recurrent peroneal nerve originates in the inferior popliteal region from the common peroneal nerve and courses horizontally around the fibula to pass just inferior of the fibula head and travel superior to the anterolateral tibial epicondyle. It accompanies the recurrent tibial artery.  Position / Prep / Materials:  Position: Supine, Modified Fowler's position with pillows under the targeted knee(s). The patient is placed in a supine  position with the knee slightly flexed by placing a pillow in the popliteal fossa. Prep solution: ChloraPrep (2% chlorhexidine gluconate and 70% isopropyl alcohol) Prep Area: Entire knee area, from  mid-thigh to mid-shin, lateral, anterior, and medial aspects. Materials:  Tray: Block Needle(s):  Type: Spinal  Gauge (G): 22  Length: 3.5-in  Qty: 3  H&P (Pre-op Assessment):  Martha Gilmore is a 83 y.o. (year old), female patient, seen today for interventional treatment. She  has a past surgical history that includes Cholecystectomy; Cesarean section; Teeth Implants; Appendectomy (1971); Trigger finger release; Vaginal hysterectomy; Cardioversion (N/A, 04/13/2021); A-FLUTTER ABLATION (N/A, 10/14/2021); and Eye surgery (cataracts). Martha Gilmore has a current medication list which includes the following prescription(s): acetaminophen , atorvastatin , vitamin d3, citalopram , levothyroxine , multiple vitamins-minerals, omeprazole , oxybutynin , and polyethyl glycol-propyl glycol. Her primarily concern today is the Knee Pain (right)  Initial Vital Signs:  Pulse/HCG Rate: 71ECG Heart Rate: 62 Temp: (!) 97.5 F (36.4 C) Resp: 16 BP: (!) 142/57 SpO2: 99 %  BMI: Estimated body mass index is 30.23 kg/m as calculated from the following:   Height as of this encounter: 5' 1 (1.549 m).   Weight as of this encounter: 160 lb (72.6 kg).  Risk Assessment: Allergies: Reviewed. She is allergic to aspirin, demerol  [meperidine hcl], nsaids, penicillins, statins, cephalexin, clindamycin/lincomycin, diclofenac , gabapentin , and tape.  Allergy Precautions: None required Coagulopathies: Reviewed. None identified.  Blood-thinner therapy: None at this time Active Infection(s): Reviewed. None identified. Martha Gilmore is afebrile  Site Confirmation: Martha Gilmore was asked to confirm the procedure and laterality before marking the site Procedure checklist: Completed Consent: Before the procedure and under the influence of no  sedative(s), amnesic(s), or anxiolytics, the patient was informed of the treatment options, risks and possible complications. To fulfill our ethical and legal obligations, as recommended by the American Medical Association's Code of Ethics, I have informed the patient of my clinical impression; the nature and purpose of the treatment or procedure; the risks, benefits, and possible complications of the intervention; the alternatives, including doing nothing; the risk(s) and benefit(s) of the alternative treatment(s) or procedure(s); and the risk(s) and benefit(s) of doing nothing. The patient was provided information about the general risks and possible complications associated with the procedure. These may include, but are not limited to: failure to achieve desired goals, infection, bleeding, organ or nerve damage, allergic reactions, paralysis, and death. In addition, the patient was informed of those risks and complications associated to the procedure, such as failure to decrease pain; infection; bleeding; organ or nerve damage with subsequent damage to sensory, motor, and/or autonomic systems, resulting in permanent pain, numbness, and/or weakness of one or several areas of the body; allergic reactions; (i.e.: anaphylactic reaction); and/or death. Furthermore, the patient was informed of those risks and complications associated with the medications. These include, but are not limited to: allergic reactions (i.e.: anaphylactic or anaphylactoid reaction(s)); adrenal axis suppression; blood sugar elevation that in diabetics may result in ketoacidosis or comma; water retention that in patients with history of congestive heart failure may result in shortness of breath, pulmonary edema, and decompensation with resultant heart failure; weight gain; swelling or edema; medication-induced neural toxicity; particulate matter embolism and blood vessel occlusion with resultant organ, and/or nervous system infarction;  and/or aseptic necrosis of one or more joints. Finally, the patient was informed that Medicine is not an exact science; therefore, there is also the possibility of unforeseen or unpredictable risks and/or possible complications that may result in a catastrophic outcome. The patient indicated having understood very clearly. We have given the patient no guarantees and we have made no promises. Enough time was given to the patient to ask questions, all of which were  answered to the patient's satisfaction. Martha Gilmore has indicated that she wanted to continue with the procedure. Attestation: I, the ordering provider, attest that I have discussed with the patient the benefits, risks, side-effects, alternatives, likelihood of achieving goals, and potential problems during recovery for the procedure that I have provided informed consent. Date  Time: 10/24/2023  9:11 AM  Pre-Procedure Preparation:  Monitoring: As per clinic protocol. Respiration, ETCO2, SpO2, BP, heart rate and rhythm monitor placed and checked for adequate function Safety Precautions: Patient was assessed for positional comfort and pressure points before starting the procedure. Time-out: I initiated and conducted the Time-out before starting the procedure, as per protocol. The patient was asked to participate by confirming the accuracy of the Time Out information. Verification of the correct person, site, and procedure were performed and confirmed by me, the nursing staff, and the patient. Time-out conducted as per Joint Commission's Universal Protocol (UP.01.01.01). Time: 0945 Start Time: 0945 hrs.  Description/Narrative of Procedure:          Rationale (medical necessity): procedure needed and proper for the diagnosis and/or treatment of the patient's medical symptoms and needs. Procedural Technique Safety Precautions: Aspiration looking for blood return was conducted prior to all injections. At no point did we inject any substances, as  a needle was being advanced. No attempts were made at seeking any paresthesias. Safe injection practices and needle disposal techniques used. Medications properly checked for expiration dates. SDV (single dose vial) medications used. Description of the Procedure: Protocol guidelines were followed. The patient was assisted into a comfortable position. The target area was identified and the area prepped in the usual manner. Skin & deeper tissues infiltrated with local anesthetic. Appropriate amount of time allowed to pass for local anesthetics to take effect. The procedure needles were then advanced to the target area. Proper needle placement secured. Negative aspiration confirmed. Solution injected in intermittent fashion, asking for systemic symptoms every 0.5cc of injectate. The needles were then removed and the area cleansed, making sure to leave some of the prepping solution back to take advantage of its long term bactericidal properties.             Vitals:   10/24/23 0936 10/24/23 0941 10/24/23 0946 10/24/23 0950  BP: 135/64 129/72 (!) 148/64 117/64  Pulse:      Resp: 14 16 13 14   Temp:      SpO2: 98%  99% 98%  Weight:      Height:         Start Time: 0945 hrs. End Time: 0950 hrs.  Imaging Guidance (Non-Spinal):          Type of Imaging Technique: Fluoroscopy Guidance (Non-Spinal) Indication(s): Fluoroscopy guidance for needle placement to enhance accuracy in procedures requiring precise needle localization for targeted delivery of medication in or near specific anatomical locations not easily accessible without such real-time imaging assistance. Exposure Time: Please see nurses notes. Contrast: None used. Fluoroscopic Guidance: I was personally present during the use of fluoroscopy. Tunnel Vision Technique used to obtain the best possible view of the target area. Parallax error corrected before commencing the procedure. Direction-depth-direction technique used to introduce the  needle under continuous pulsed fluoroscopy. Once target was reached, antero-posterior, oblique, and lateral fluoroscopic projection used confirm needle placement in all planes. Images permanently stored in EMR. Interpretation: No contrast injected. I personally interpreted the imaging intraoperatively. Adequate needle placement confirmed in multiple planes. Permanent images saved into the patient's record.  Post-operative Assessment:  Post-procedure Vital Signs:  Pulse/HCG Rate: 7160 Temp: (!) 97.5 F (36.4 C) Resp: 14 BP: 117/64 SpO2: 98 %  EBL: None  Complications: No immediate post-treatment complications observed by team, or reported by patient.  Note: The patient tolerated the entire procedure well. A repeat set of vitals were taken after the procedure and the patient was kept under observation following institutional policy, for this type of procedure. Post-procedural neurological assessment was performed, showing return to baseline, prior to discharge. The patient was provided with post-procedure discharge instructions, including a section on how to identify potential problems. Should any problems arise concerning this procedure, the patient was given instructions to immediately contact us , at any time, without hesitation. In any case, we plan to contact the patient by telephone for a follow-up status report regarding this interventional procedure.  Comments:  No additional relevant information.  Plan of Care (POC)  Orders:  Orders Placed This Encounter  Procedures   DG PAIN CLIN[facility] C-ARM 1-60 MIN NO REPORT    Intraoperative interpretation by procedural physician at [facility].    Standing Status:   Standing    Number of Occurrences:   1    Reason for exam::   Assistance in needle guidance and placement for procedures requiring needle placement in or near specific anatomical locations not easily accessible without such assistance.     Medications ordered for  procedure: Meds ordered this encounter  Medications   lidocaine  (XYLOCAINE ) 2 % (with pres) injection 400 mg   ropivacaine (PF) 2 mg/mL (0.2%) (NAROPIN) injection 9 mL   dexamethasone  (DECADRON ) injection 10 mg   Medications administered: We administered lidocaine , ropivacaine (PF) 2 mg/mL (0.2%), and dexamethasone .  See the medical record for exact dosing, route, and time of administration.    Right genicular nerve block: 10/24/2023    Follow-up plan:   Return in about 4 weeks (around 11/21/2023), or F2F PPE.     Recent Visits Date Type Provider Dept  09/27/23 Office Visit Marcelino Nurse, MD [facility]  Showing recent visits within past 90 days and meeting all other requirements Today's Visits Date Type Provider Dept  10/24/23 Procedure visit Marcelino Nurse, MD [facility]  Showing today's visits and meeting all other requirements Future Appointments Date Type Provider Dept  11/22/23 Appointment Marcelino Nurse, MD [facility]  Showing future appointments within next 90 days and meeting all other requirements   Disposition: Discharge home  Discharge (Date  Time): 10/24/2023; 1001 hrs.   Primary Care Physician: Oris Camie BRAVO, NP Location: [facility] Note by: Nurse Marcelino, MD (TTS technology used. I apologize for any typographical errors that were not detected and corrected.) Date: 10/24/2023; Time: 10:21 AM  Disclaimer:  Medicine is not an Visual merchandiser. The only guarantee in medicine is that nothing is guaranteed. It is important to note that the decision to proceed with this intervention was based on the information collected from the patient. The Data and conclusions were drawn from the patient's questionnaire, the interview, and the physical examination. Because the information was provided in large part by the patient, it cannot be guaranteed that it has not been purposely or unconsciously manipulated. Every effort has  been made to obtain as much relevant data as possible for this evaluation. It is important to note that the conclusions that lead to this procedure are derived in large part from the available data. Always take into account that the treatment will also be dependent on availability of resources and existing treatment  guidelines, considered by other Pain Management Practitioners as being common knowledge and practice, at the time of the intervention. For Medico-Legal purposes, it is also important to point out that variation in procedural techniques and pharmacological choices are the acceptable norm. The indications, contraindications, technique, and results of the above procedure should only be interpreted and judged by a Board-Certified Interventional Pain Specialist with extensive familiarity and expertise in the same exact procedure and technique.

## 2023-10-24 NOTE — Patient Instructions (Signed)

## 2023-10-24 NOTE — Progress Notes (Signed)
 Safety precautions to be maintained throughout the outpatient stay will include: orient to surroundings, keep bed in low position, maintain call bell within reach at all times, provide assistance with transfer out of bed and ambulation.

## 2023-10-25 ENCOUNTER — Telehealth: Payer: Self-pay | Admitting: *Deleted

## 2023-10-25 NOTE — Telephone Encounter (Signed)
 Post procedure call; voicemail left to call the office there any concerns.

## 2023-10-30 ENCOUNTER — Ambulatory Visit

## 2023-10-30 DIAGNOSIS — Z0000 Encounter for general adult medical examination without abnormal findings: Secondary | ICD-10-CM | POA: Diagnosis not present

## 2023-10-30 NOTE — Progress Notes (Signed)
 Subjective:   Martha Gilmore is a 83 y.o. who presents for a Medicare Wellness preventive visit.  As a reminder, Annual Wellness Visits don't include a physical exam, and some assessments may be limited, especially if this visit is performed virtually. We may recommend an in-person follow-up visit with your provider if needed.  Visit Complete: Virtual I connected with  Martha Gilmore on 10/30/23 by a audio enabled telemedicine application and verified that I am speaking with the correct person using two identifiers.  Patient Location: Home  Provider Location: Office/Clinic  I discussed the limitations of evaluation and management by telemedicine. The patient expressed understanding and agreed to proceed.  Vital Signs: Because this visit was a virtual/telehealth visit, some criteria may be missing or patient reported. Any vitals not documented were not able to be obtained and vitals that have been documented are patient reported.  VideoError- Librarian, academic were attempted between this provider and patient, however failed, due to patient having technical difficulties OR patient did not have access to video capability.  We continued and completed visit with audio only.   Persons Participating in Visit: Patient.  AWV Questionnaire: Yes: Patient Medicare AWV questionnaire was completed by the patient on 10/26/2023; I have confirmed that all information answered by patient is correct and no changes since this date.  Cardiac Risk Factors include: advanced age (>72men, >31 women);dyslipidemia;hypertension     Objective:    Today's Vitals   10/30/23 0926  PainSc: 3    There is no height or weight on file to calculate BMI.     10/30/2023    9:31 AM 10/24/2023    9:17 AM 10/17/2022    8:23 PM 06/22/2022    8:53 AM 05/22/2022    3:47 PM 10/14/2021   12:06 PM 06/09/2021    8:51 AM  Advanced Directives  Does Patient Have a Medical Advance Directive? Yes Yes No  Yes Yes Yes Yes  Type of Estate agent of [place];Living will   Healthcare Power of [place];Living will Living will;Healthcare Power of [employer] Power of Attorney  Does patient want to make changes to medical advance directive?     No - Patient declined    Copy of Healthcare Power of Attorney in Chart? No - copy requested   No - copy requested No - copy requested  No - copy requested    Current Medications (verified) Outpatient Encounter Medications as of 10/30/2023  Medication Sig   acetaminophen  (TYLENOL ) 500 MG tablet Take 500 mg by mouth every 6 (six) hours as needed.   atorvastatin  (LIPITOR) 10 MG tablet TAKE 1 TABLET BY MOUTH EVERYDAY AT BEDTIME   Cholecalciferol (VITAMIN D3) 1.25 MG (50000 UT) CAPS TAKE 1 CAPSULE BY MOUTH ONE TIME PER WEEK   citalopram  (CELEXA ) 20 MG tablet Take 1 tablet (20 mg total) by mouth daily.   levothyroxine  (SYNTHROID ) 100 MCG tablet Take 1 tablet (100 mcg total) by mouth daily before breakfast. Mon- Sat 100 mcg daily, and Sun 150 mcg daily   Multiple Vitamins-Minerals (PRESERVISION AREDS PO) Take 1 capsule by mouth in the morning and at bedtime.   omeprazole  (PRILOSEC) 20 MG capsule TAKE 1 CAPSULE BY MOUTH EVERY DAY   oxybutynin  (DITROPAN -XL) 10 MG 24 hr tablet TAKE 1 TABLET BY MOUTH EVERYDAY AT BEDTIME   Polyethyl Glycol-Propyl Glycol (SYSTANE OP) Place 1 drop into both eyes daily as needed (DRY EYES).   No facility-administered encounter medications on file as of 10/30/2023.  Allergies (verified) Aspirin, Demerol  [meperidine hcl], Nsaids, Penicillins, Statins, Cephalexin, Clindamycin/lincomycin, Diclofenac , Gabapentin , and Tape   History: Past Medical History:  Diagnosis Date   Allergy    Aortic insufficiency    mild to moderate by echo 12/2020   Arrhythmia    Arthritis    Atrial flutter (HCC) 12/30/2020   Cancer (HCC)    basal cell   Cataracts, bilateral 09/23/2015   Clostridium difficile infection 04/16/2017    H/o C.diff due to clindamicyin 09/2016; two rounds of treatment for cure. Dr. Rollin   Depression    Encounter for annual physical exam 05/09/2023   GERD (gastroesophageal reflux disease)    Heart murmur    Hyperlipidemia    OAB (overactive bladder) 12/16/2013   Osteopenia 08/05/2012   Dexa scan 07/2012 mild osteopenia, T = -1.1   Other fatigue 09/25/2022   Seasonal allergies    Sleep apnea    Thyroid  disease    Urge and stress incontinence    Past Surgical History:  Procedure Laterality Date   A-FLUTTER ABLATION N/A 10/14/2021   Procedure: A-FLUTTER ABLATION;  Surgeon: Inocencio Soyla Lunger, MD;  Location: [facility];  Service: Cardiovascular;  Laterality: N/A;   APPENDECTOMY  1971   CARDIOVERSION N/A 04/13/2021   Procedure: CARDIOVERSION;  Surgeon: Santo Stanly LABOR, MD;  Location: [facility] ENDOSCOPY;  Service: Cardiovascular;  Laterality: N/A;   CESAREAN SECTION     CHOLECYSTECTOMY     COSMETIC SURGERY     Mohs/end of nose   EYE SURGERY  cataracts   Teeth Implants     TRIGGER FINGER RELEASE     VAGINAL HYSTERECTOMY     Family History  Problem Relation Age of Onset   Heart disease Mother    Hypertension Mother    Arthritis Mother    Miscarriages / [place] Mother    Obesity Mother    Cancer Father    Heart disease Father    Obesity Father    Lung cancer Sister    Heart disease Sister    Diabetes Sister    Cancer Sister    Early death Sister    Obesity Sister    Hypertension Sister    Kidney cancer Sister    Brain cancer Brother    Heart disease Maternal Grandmother    Cancer Daughter    Arthritis Sister    Cancer Sister    Kidney disease Sister    Social History   Socioeconomic History   Marital status: Widowed    Spouse name: Not on file   Number of children: Not on file   Years of education: Not on file   Highest education level: Some college, no degree  Occupational History   Not on file  Tobacco Use   Smoking status: Never    Passive  exposure: Yes   Smokeless tobacco: Never   Tobacco comments:    grew up in house of smokers  Vaping Use   Vaping status: Never Used  Substance and Sexual Activity   Alcohol use: No   Drug use: No   Sexual activity: Not Currently    Partners: Male    Birth control/protection: None  Other Topics Concern   Not on file  Social History Narrative   Not on file   Social Drivers of Health   Financial Resource Strain: Low Risk  (10/26/2023)   Overall Financial Resource Strain (CARDIA)    Difficulty of Paying Living Expenses: Not hard at all  Food Insecurity: No Food  Insecurity (10/26/2023)   Hunger Vital Sign    Worried About Running Out of Food in the Last Year: Never true    Ran Out of Food in the Last Year: Never true  Transportation Needs: No Transportation Needs (10/26/2023)   PRAPARE - Administrator, Civil Service (Medical): No    Lack of Transportation (Non-Medical): No  Physical Activity: Inactive (10/26/2023)   Exercise Vital Sign    Days of Exercise per Week: 0 days    Minutes of Exercise per Session: Not on file  Stress: No Stress Concern Present (10/26/2023)   [employer] of Occupational Health - Occupational Stress Questionnaire    Feeling of Stress: Not at all  Social Connections: Moderately Integrated (10/26/2023)   Social Connection and Isolation Panel    Frequency of Communication with Friends and Family: More than three times a week    Frequency of Social Gatherings with Friends and Family: Twice a week    Attends Religious Services: More than 4 times per year    Active Member of [employer] or Organizations: Yes    Attends Banker Meetings: More than 4 times per year    Marital Status: Widowed    Tobacco Counseling Counseling given: Not Answered Tobacco comments: grew up in house of smokers    Clinical Intake:  Pre-visit preparation completed: Yes  Pain : 0-10 Pain Score: 3  Pain Type: Chronic pain Pain Location: Knee Pain  Orientation: Right Pain Descriptors / Indicators: Aching Pain Onset: More than a month ago Pain Frequency: Constant     Nutritional Risks: None Diabetes: No  Lab Results  Component Value Date   HGBA1C 5.5 05/08/2023   HGBA1C 5.7 (H) 09/25/2022     How often do you need to have someone help you when you read instructions, pamphlets, or other written materials from your doctor or pharmacy?: 1 - Never  Interpreter Needed?: No  Information entered by :: NAllen LPN   Activities of Daily Living     10/30/2023    9:27 AM  In your present state of health, do you have any difficulty performing the following activities:  Hearing? 0  Vision? 0  Difficulty concentrating or making decisions? 0  Walking or climbing stairs? 1  Comment due to knee  Dressing or bathing? 0  Doing errands, shopping? 0  Preparing Food and eating ? N  Using the Toilet? N  In the past six months, have you accidently leaked urine? N  Do you have problems with loss of bowel control? N  Managing your Medications? N  Managing your Finances? N  Housekeeping or managing your Housekeeping? N    Patient Care Team: Early, Camie BRAVO, NP as PCP - General (Nurse Practitioner) Inocencio Soyla Lunger, MD as PCP - Electrophysiology (Cardiology) Sheena Pugh, DO as PCP - Cardiology (Cardiology) Hosie Rush, MD as Consulting Physician (Endocrinology) Rollin Dover, MD as Consulting Physician (Gastroenterology) Camella Fallow, MD as Consulting Physician (Orthopedic Surgery) Cary Doffing, MD as Consulting Physician (Dermatology) Vernona Slough, OD as Consulting Physician (Ophthalmology) [facility] as Consulting Physician (Audiology) Pandora Cadet, [facility] as Pharmacist (Pharmacist) Darlis Deatrice RAMAN, MD as Consulting Physician (Pain Medicine)  I have updated your Care Teams any recent Medical Services you may have received from other providers in the past year.     Assessment:   This is a  routine wellness examination for Martha Gilmore.  Hearing/Vision screen Hearing Screening - Comments:: Has hearing aids that are maintained Vision  Screening - Comments:: Regular eye exams, Dr. Jon Likes   Goals Addressed             This Visit's Progress    Patient Stated       10/30/2023, continue losing weight       Depression Screen     10/30/2023    9:33 AM 10/24/2023    9:17 AM 05/07/2023    9:39 AM 09/25/2022    2:43 PM 07/12/2022    3:14 PM 06/28/2022    2:34 PM 06/22/2022    8:51 AM  PHQ 2/9 Scores  PHQ - 2 Score 0 0 0 0 0 0 0  PHQ- 9 Score 0          Fall Risk     10/30/2023    9:32 AM 10/24/2023    9:17 AM 09/27/2023    9:00 AM 05/07/2023    9:39 AM 09/25/2022    2:41 PM  Fall Risk   Falls in the past year? 0 0 1 1 1   Number falls in past yr: 0  1 0 0  Injury with Fall? 0  1 0 0  Comment   ED    Risk for fall due to : Medication side effect;Impaired mobility  Impaired balance/gait Other (Comment) Other (Comment)  Risk for fall due to: Comment    tripped on garden hose missed the chair  Follow up Falls evaluation completed;Falls prevention discussed   Falls evaluation completed Falls evaluation completed    MEDICARE RISK AT HOME:  Medicare Risk at Home Any stairs in or around the home?: Yes If so, are there any without handrails?: No Home free of loose throw rugs in walkways, pet beds, electrical cords, etc?: Yes Adequate lighting in your home to reduce risk of falls?: Yes Life alert?: No Use of a cane, walker or w/c?: Yes Grab bars in the bathroom?: No Shower chair or bench in shower?: No Elevated toilet seat or a handicapped toilet?: Yes  TIMED UP AND GO:  Was the test performed?  No  Cognitive Function: 6CIT completed    02/28/2018   11:09 AM  MMSE - Mini Mental State Exam  Orientation to time 5  Orientation to Place 5  Registration 3  Attention/ Calculation 5  Recall 2  Language- name 2 objects 2  Language- repeat 1  Language- follow 3 step command  3  Language- read & follow direction 1  Write a sentence 1  Copy design 1  Total score 29        10/30/2023    9:33 AM 06/22/2022    8:55 AM 06/09/2021    8:56 AM 06/03/2020    1:59 PM  6CIT Screen  What Year? 0 points 0 points 0 points 0 points  What month? 0 points 0 points 0 points 0 points  What time? 0 points 0 points 0 points   Count back from 20 0 points 0 points 0 points 0 points  Months in reverse 0 points 0 points 0 points 0 points  Repeat phrase 2 points 0 points 2 points 2 points  Total Score 2 points 0 points 2 points     Immunizations Immunization History  Administered Date(s) Administered   Fluad Quad(high Dose 65+) 01/13/2021, 12/28/2021   Influenza Split 01/15/2012   Influenza, High Dose Seasonal PF 01/30/2013, 01/05/2015, 02/17/2016, 02/02/2017, 01/18/2018, 12/11/2018, 12/23/2019, 01/13/2021, 01/01/2023   Influenza, Seasonal, Injecte, Preservative Fre 01/21/2014   Influenza,inj,Quad PF,6+ Mos 01/21/2014   Influenza-Unspecified 01/09/2011,  01/15/2012, 01/21/2014, 01/22/2018   PFIZER(Purple Top)SARS-COV-2 Vaccination 05/15/2019, 06/05/2019, 01/20/2020, 08/16/2020   Pfizer Covid-19 Vaccine Bivalent Booster 7yrs & up 01/14/2021   Pfizer(Comirnaty)Fall Seasonal Vaccine 12 years and older 01/01/2023   Pneumococcal Conjugate-13 01/05/2015   Pneumococcal Polysaccharide-23 04/24/2006, 11/18/2007, 05/30/2012   Respiratory Syncytial Virus Vaccine,Recomb Aduvanted(Arexvy) 03/28/2022   Tdap 08/17/2010, 05/29/2022   Zoster Recombinant(Shingrix) 06/29/2018, 11/01/2018   Zoster, Live 04/25/1995    Screening Tests Health Maintenance  Topic Date Due   COVID-19 Vaccine (7 - Pfizer risk 2024-25 season) 07/01/2023   DEXA SCAN  08/18/2023   INFLUENZA VACCINE  11/23/2023   MAMMOGRAM  06/25/2024   Medicare Annual Wellness (AWV)  10/29/2024   Pneumococcal Vaccine: 50+ Years  Completed   Zoster Vaccines- Shingrix  Completed   Hepatitis B Vaccines  Aged Out   HPV VACCINES   Aged Out   Meningococcal B Vaccine  Aged Out   DTaP/Tdap/Td  Discontinued    Health Maintenance  Health Maintenance Due  Topic Date Due   COVID-19 Vaccine (7 - Pfizer risk 2024-25 season) 07/01/2023   DEXA SCAN  08/18/2023   Health Maintenance Items Addressed: Will discuss DEXA with Martha Gilmore.  Additional Screening:  Vision Screening: Recommended annual ophthalmology exams for early detection of glaucoma and other disorders of the eye. Would you like a referral to an eye doctor? No    Dental Screening: Recommended annual dental exams for proper oral hygiene  Community Resource Referral / Chronic Care Management: CRR required this visit?  No   CCM required this visit?  No   Plan:    I have personally reviewed and noted the following in the patient's chart:   Medical and social history Use of alcohol, tobacco or illicit drugs  Current medications and supplements including opioid prescriptions. Patient is not currently taking opioid prescriptions. Functional ability and status Nutritional status Physical activity Advanced directives List of other physicians Hospitalizations, surgeries, and ER visits in previous 12 months Vitals Screenings to include cognitive, depression, and falls Referrals and appointments  In addition, I have reviewed and discussed with patient certain preventive protocols, quality metrics, and best practice recommendations. A written personalized care plan for preventive services as well as general preventive health recommendations were provided to patient.   Martha FORBES Dawn, LPN   05/25/7972   After Visit Summary: (MyChart) Due to this being a telephonic visit, the after visit summary with patients personalized plan was offered to patient via MyChart   Notes: Nothing significant to report at this time.

## 2023-10-30 NOTE — Patient Instructions (Addendum)
 Ms. Martha Gilmore , Thank you for taking time out of your busy schedule to complete your Annual Wellness Visit with me. I enjoyed our conversation and look forward to speaking with you again next year. I, as well as your care team,  appreciate your ongoing commitment to your health goals. Please review the following plan we discussed and let me know if I can assist you in the future. Your Game plan/ To Do List    Referrals: If you haven't heard from the office you've been referred to, please reach out to them at the phone provided.  N/a Follow up Visits: Next Medicare AWV with our clinical staff: 11/11/2024 at 9:30   Have you seen your provider in the last 6 months (3 months if uncontrolled diabetes)? Yes Next Office Visit with your provider: 11/12/2023 at 11:00  Clinician Recommendations:  Aim for 30 minutes of exercise or brisk walking, 6-8 glasses of water, and 5 servings of fruits and vegetables each day.       This is a list of the screening recommended for you and due dates:  Health Maintenance  Topic Date Due   COVID-19 Vaccine (7 - Pfizer risk 2024-25 season) 07/01/2023   DEXA scan (bone density measurement)  08/18/2023   Flu Shot  11/23/2023   Mammogram  06/25/2024   Medicare Annual Wellness Visit  10/29/2024   Pneumococcal Vaccine for age over 57  Completed   Zoster (Shingles) Vaccine  Completed   Hepatitis B Vaccine  Aged Out   HPV Vaccine  Aged Out   Meningitis B Vaccine  Aged Out   DTaP/Tdap/Td vaccine  Discontinued    Advanced directives: (Copy Requested) Please bring a copy of your health care power of attorney and living will to the office to be added to your chart at your convenience. You can mail to [facility] [address] [place], [place] [address] or email to [email or web address] Advance Care Planning is important because it:  [x]  Makes sure you receive the medical care that is consistent with your values, goals, and preferences  [x]  It  provides guidance to your family and loved ones and reduces their decisional burden about whether or not they are making the right decisions based on your wishes.  Follow the link provided in your after visit summary or read over the paperwork we have mailed to you to help you started getting your Advance Directives in place. If you need assistance in completing these, please reach out to us  so that we can help you!  See attachments for Preventive Care and Fall Prevention Tips.

## 2023-11-12 ENCOUNTER — Encounter: Payer: Self-pay | Admitting: Nurse Practitioner

## 2023-11-12 ENCOUNTER — Ambulatory Visit
Admission: RE | Admit: 2023-11-12 | Discharge: 2023-11-12 | Disposition: A | Discharge status: Home / Self Care | Source: Ambulatory Visit | Attending: Nurse Practitioner

## 2023-11-12 ENCOUNTER — Ambulatory Visit: Admitting: Nurse Practitioner

## 2023-11-12 VITALS — BP 156/86 | HR 78 | Wt 162.8 lb

## 2023-11-12 DIAGNOSIS — I10 Essential (primary) hypertension: Secondary | ICD-10-CM

## 2023-11-12 DIAGNOSIS — M25511 Pain in right shoulder: Secondary | ICD-10-CM | POA: Diagnosis not present

## 2023-11-12 MED ORDER — TRAMADOL HCL 50 MG PO TABS: 50.0000 mg | ORAL_TABLET | Freq: Three times a day (TID) | ORAL | 0 refills | Status: DC | PRN

## 2023-11-12 NOTE — Assessment & Plan Note (Addendum)
 Right shoulder pain suspected due to rotator cuff tendinopathy and possible arthritis, characterized by inflammation and pain exacerbated by certain movements, such as abduction beyond 90 degress and internal rotation. Likely secondary to overuse from increased reliance on the right arm due to knee issues. AFib and aspirin allergy limit NSAID use. Prefers steroid injection over oral steroids for inflammation and pain relief, as it has been effective for her knee previously. - Order shoulder x-ray to rule out other issues. - Schedule ultrasound-guided steroid injection with Dr. Vita on November 19, 2023. - Advise heat or ice application as tolerated for pain relief. - Discuss topical treatments like Biofreeze or Voltaren  gel. - Tramadol  sent for help with pain while she waits as she has used this safely in the past.

## 2023-11-12 NOTE — Progress Notes (Signed)
 Martha FORBES Doing, DNP, AGNP-c [facility] [address] [place], [place] [address] [phone]   ACUTE VISIT- ESTABLISHED PATIENT  Blood pressure (!) 156/86, pulse 78, weight 162 lb 12.8 oz (73.8 kg).  Subjective:  HPI History of Present Illness Martha Gilmore is an 83 year old female with knee pain and atrial fibrillation who presents with right shoulder pain.  She experiences significant right shoulder pain, located around the scapula and radiating to the biceps tendon area. The pain is exacerbated by certain movements, particularly when pushing back or lifting covers at night. It is persistent and not relieved by extra strength Tylenol  arthritis. She suspects the pain might be related to overuse, as she has been using her right arm more frequently due to her left side being her dominant side and using a cane for her knee. She reports that her hands feel cold and sometimes fall asleep while driving, which she wonders might be related to her shoulder pain. She also experiences sleep disturbances due to the shoulder pain, which wakes her up at night. No pain upon touching the shoulder, but tenderness in the biceps tendon area. No pain with upward or downward pressure against resistance or internal rotation, but some pain with external rotation and empty can.   She has a history of knee pain for which she has previously received nerve blocks and is awaiting evaluation for a nerve ablation procedure. She is trying to avoid knee replacement surgery. The knee pain is severe enough that she would have opted for surgery if it were available sooner. She uses a heating pad for her knee as ice causes stabbing pain, and she finds some relief with heat.  Her past medical history includes atrial fibrillation and an allergy to aspirin, which limits her use of NSAIDs for pain management. She has previously taken prednisone without adverse effects. She has tramadol  available but  experienced vomiting when taken on an empty stomach. She has used topical treatments like Voltaren  gel and Biofreeze but has not found significant relief.  ROS negative except for what is listed in HPI. History, Medications, Surgery, SDOH, and Family History reviewed and updated as appropriate.  Objective:  Physical Exam Musculoskeletal:     Right shoulder: Tenderness present.    Shoulder Musculoskeletal Exam  Inspection   Right     Ecchymosis: none     Peripheral edema: none     Atrophy: none     Symmetry: symmetric     Masses: none     Skin tenting: none   Left     Left shoulder inspection is normal.  Palpation   Right     Tenderness: present       Anterior shoulder: none       Clavicle: none       AC joint: none       Sternoclavicular joint: none       Rotator cuff: mild       Greater tuberosity: none       Trapezius: none       Superior pole of scapula: mild       Proximal biceps: mild       Distal biceps: mild   Left     Left shoulder palpation is normal.  Range of Motion   Right     Active ROM: pain.      Passive ROM: pain.      Active forward elevation: 130.      Passive forward elevation: 140.  Shoulder active abduction: 90.      Passive abduction: 110.      Active external rotation at side: 90.      Passive external rotation at side: 90.      Active external rotation in abduction: 70.      Passive external rotation in abduction: 90.      Active internal rotation in abduction: 10.      Passive internal rotation in abduction: 20.    Left     Left shoulder range of motion is normal.   Strength   Right     External rotation: 4/5.      Internal rotation: 3/5. Internal rotation is affected by pain.      Abduction: 4/5.      Biceps: 4/5.      Triceps: 4/5.    Left     Left shoulder strength is normal.   Neurovascular   Right     Right shoulder nerve sensation is normal.   Left     Left shoulder nerve sensation is normal.  Scapula    Right     Position: normal     Winging: none       Assessment & Plan:   Problem List Items Addressed This Visit     Hypertension   Elevated blood pressure likely secondary to pain from shoulder and knee issues. - Monitor blood pressure and address pain management to aid in hypertension control.      Acute pain of right shoulder - Primary   Right shoulder pain suspected due to rotator cuff tendinopathy and possible arthritis, characterized by inflammation and pain exacerbated by certain movements, such as abduction beyond 90 degress and internal rotation. Likely secondary to overuse from increased reliance on the right arm due to knee issues. AFib and aspirin allergy limit NSAID use. Prefers steroid injection over oral steroids for inflammation and pain relief, as it has been effective for her knee previously. - Order shoulder x-ray to rule out other issues. - Schedule ultrasound-guided steroid injection with Dr. Vita on November 19, 2023. - Advise heat or ice application as tolerated for pain relief. - Discuss topical treatments like Biofreeze or Voltaren  gel. - Tramadol  sent for help with pain while she waits as she has used this safely in the past.       Relevant Medications   traMADol  (ULTRAM ) 50 MG tablet   Other Relevant Orders   DG Shoulder Right      Martha FORBES Doing, DNP, AGNP-c

## 2023-11-12 NOTE — Assessment & Plan Note (Signed)
 Elevated blood pressure likely secondary to pain from shoulder and knee issues. - Monitor blood pressure and address pain management to aid in hypertension control.

## 2023-11-12 NOTE — Patient Instructions (Addendum)
 I have sent the order for the x-ray to [facility] at  [facility]. You can walk in to have this done.    I would like you to use heat 20 minutes and/or ice 20 minutes 2-3 times a day to help with the pain.  You can take the Tramadol  up to every 8 hours for pain. Try 1/2 dose first and see if this is enough.  Use the Voltaren  gel and icy/hot also.

## 2023-11-14 ENCOUNTER — Ambulatory Visit: Payer: Self-pay | Admitting: Nurse Practitioner

## 2023-11-18 NOTE — Progress Notes (Unsigned)
   Name: Martha Gilmore   Date of Visit: 11/19/23   Date of last visit with me: Visit date not found   CHIEF COMPLAINT:  Chief Complaint  Patient presents with   Procedure    Right shoulder injection.       HPI:   Patient is presenting for right shoulder pain 2 months.  Patient notes that she is left-hand dominant but is using her left hand for cane.  Patient's right shoulder pain is limited by sharp stabbing pain.  Patient sleeps on her right side.  Patient notes that if she abducts greater than 30 to 40 degrees.  No significant nighttime pain.  Patient notes significant worsening of her pain as she uses her shoulder more.  No other inciting event the patient or trauma the patient can recall. OBJECTIVE:   BP 128/84   Pulse 64   Wt 163 lb 6.4 oz (74.1 kg)   SpO2 99%   BMI 30.87 kg/m     BP Readings from Last 3 Encounters:  11/19/23 128/84  11/12/23 (!) 156/86  10/24/23 117/64   Physical exam: Right shoulder inspection revealed no gross abnormalities.  Mild tenderness to palpation over the humeral head laterally.  Range of motion is full though there are some pain noted with abduction greater than 45 degrees.  Positive pain with abduction against resistance, empty can, Hawkins.  IMAGING:  - Independent interpretation of previous x-ray done on 11/12/2023 shows no signs of acute trauma.  No noted significant arthritic changes.  Mild degenerative changes noted.  Noted AC joint arthropathy.  ASSESSMENT/PLAN:   Assessment & Plan Rotator cuff injury, initial encounter - Discussed with patient that her physical exam as well as symptoms are likely related to rotator cuff pathology.   -Given previous x-rays which showed no signs of acute trauma or significant arthritic change, I suspect patient is likely dealing with a rotator cuff pathology at this time.  Patient states that she cannot take NSAIDs due to an allergy.  Given this, we will go ahead and proceed with injection at this  time in order to calm down patient's pain so that she may participate in physical therapy. - Patient tolerated procedure well.  Plan to have patient follow-up in 6 weeks for reevaluation at that time.  US -guided glenohumeral joint injection, RIGHT shoulder After discussion on risks/benefits/indications, informed verbal consent was obtained. A timeout was then performed. The patient was positioned lying lateral recumbent on examination table. The patient's shoulder was prepped with betadine and multiple alcohol swabs and utilizing ultrasound guidance, the patient's glenohumeral joint was identified on ultrasound. Using ultrasound guidance a 22-gauge, 3.5 inch needle with a mixture of 2:2:2 cc's lidocaine :bupivicaine:depomedrol was directed from a lateral to medial direction via in-plane technique into the glenohumeral joint with visualization of appropriate spread of injectate into the joint. Patient tolerated the procedure well without immediate complications.    Palmina Clodfelter A. Vita MD Primary Care and Sports Medicine [facility]

## 2023-11-18 NOTE — Assessment & Plan Note (Deleted)
Continue to monitor, stable

## 2023-11-18 NOTE — Assessment & Plan Note (Deleted)
 PLAN FOR TREATMENT

## 2023-11-18 NOTE — Patient Instructions (Incomplete)
 Today you received an injection with corticosteroid. This injection is usually done in response to pain and inflammation. There is some "numbing" medicine also in the shot so the injected area may be numb and feel really good for the next couple of hours. The numbing medicine usually wears off in 2-3 hours though, and then your pain level will be right back where it was before the injection.   The actually benefit from the steroid injection is usually noticed in 2-7 days. You may actually experience a small (as in 10%) INCREASE in pain in the first 24 hours---that is common.   Things to watch out for that you should contact us or a health care provider urgently would include: 1. Unusual (as in more than 10%) increase in pain 2. New fever > 101.5 3. New swelling or redness of the injected area.  4. Streaking of red lines around the area injected.

## 2023-11-19 ENCOUNTER — Ambulatory Visit: Admitting: Nurse Practitioner

## 2023-11-19 ENCOUNTER — Ambulatory Visit: Admitting: Family Medicine

## 2023-11-19 ENCOUNTER — Encounter: Payer: Self-pay | Admitting: Family Medicine

## 2023-11-19 VITALS — BP 128/84 | HR 64 | Wt 163.4 lb

## 2023-11-19 DIAGNOSIS — I10 Essential (primary) hypertension: Secondary | ICD-10-CM

## 2023-11-19 DIAGNOSIS — S46001A Unspecified injury of muscle(s) and tendon(s) of the rotator cuff of right shoulder, initial encounter: Secondary | ICD-10-CM

## 2023-11-19 DIAGNOSIS — I351 Nonrheumatic aortic (valve) insufficiency: Secondary | ICD-10-CM

## 2023-11-19 DIAGNOSIS — S46009A Unspecified injury of muscle(s) and tendon(s) of the rotator cuff of unspecified shoulder, initial encounter: Secondary | ICD-10-CM

## 2023-11-19 MED ORDER — BUPIVACAINE HCL 0.25 % IJ SOLN
2.0000 mL | Freq: Once | INTRAMUSCULAR | Status: AC
  Administered 2023-11-19: 2 mL

## 2023-11-19 MED ORDER — LIDOCAINE HCL 1 % IJ SOLN
5.0000 mL | Freq: Once | INTRAMUSCULAR | Status: AC
Start: 2023-11-19 — End: 2023-11-19
  Administered 2023-11-19: 5 mL

## 2023-11-19 MED ORDER — METHYLPREDNISOLONE ACETATE 40 MG/ML IJ SUSP
80.0000 mg | Freq: Once | INTRAMUSCULAR | Status: AC
  Administered 2023-11-19: 80 mg via INTRAMUSCULAR

## 2023-11-21 ENCOUNTER — Encounter: Payer: Self-pay | Admitting: Nurse Practitioner

## 2023-11-22 ENCOUNTER — Encounter: Payer: Self-pay | Admitting: Student in an Organized Health Care Education/Training Program

## 2023-11-22 ENCOUNTER — Other Ambulatory Visit: Payer: Self-pay | Admitting: Nurse Practitioner

## 2023-11-22 ENCOUNTER — Ambulatory Visit
Attending: Student in an Organized Health Care Education/Training Program | Admitting: Student in an Organized Health Care Education/Training Program

## 2023-11-22 ENCOUNTER — Encounter: Payer: Self-pay | Admitting: Nurse Practitioner

## 2023-11-22 VITALS — BP 151/47 | HR 65 | Temp 97.5°F | Ht 61.0 in | Wt 161.0 lb

## 2023-11-22 DIAGNOSIS — M1711 Unilateral primary osteoarthritis, right knee: Secondary | ICD-10-CM | POA: Diagnosis not present

## 2023-11-22 DIAGNOSIS — G8929 Other chronic pain: Secondary | ICD-10-CM | POA: Insufficient documentation

## 2023-11-22 DIAGNOSIS — M25511 Pain in right shoulder: Secondary | ICD-10-CM

## 2023-11-22 DIAGNOSIS — M25561 Pain in right knee: Secondary | ICD-10-CM | POA: Insufficient documentation

## 2023-11-22 MED ORDER — TRAMADOL HCL 50 MG PO TABS
50.0000 mg | ORAL_TABLET | Freq: Three times a day (TID) | ORAL | 0 refills | Status: DC | PRN
Start: 2023-11-22 — End: 2023-12-12

## 2023-11-22 NOTE — Patient Instructions (Signed)

## 2023-11-22 NOTE — Progress Notes (Signed)
 PROVIDER NOTE: Interpretation of information contained herein should be left to medically-trained personnel. Specific patient instructions are provided elsewhere under Patient Instructions section of medical record. This document was created in part using AI and STT-dictation technology, any transcriptional errors that may result from this process are unintentional.  Patient: Martha Gilmore  Service: E/M   PCP: Oris Camie BRAVO, NP  DOB: [birth year]  DOS: 11/22/2023  Provider: Wallie Sherry, MD  MRN: [id number]  Delivery: Face-to-face  Specialty: Interventional Pain Management  Type: Established Patient  Setting: Ambulatory outpatient facility  Specialty designation: 09  Referring Prov.: Early, Camie BRAVO, NP  Location: Outpatient office facility       History of present illness (HPI) Martha Gilmore, a 83 y.o. year old female, is here today because of her Primary osteoarthritis of right knee [M17.11]. Ms. Kimbley primary complain today is Knee Pain (right)  Pertinent problems: [name] does not have any pertinent problems on file.  Pain Assessment: Severity of Chronic pain is reported as a 4 /10. Location: Knee Right/denies. Onset: More than a month ago. Quality: Aching, Stabbing. Timing: Constant. Modifying factor(s): sitting,. Vitals:  height is 5' 1 (1.549 m) and weight is 161 lb (73 kg). Her temperature is 97.5 F (36.4 C) (abnormal). Her blood pressure is 151/47 (abnormal) and her pulse is 65. Her oxygen saturation is 99%.  BMI: Estimated body mass index is 30.42 kg/m as calculated from the following:   Height as of this encounter: 5' 1 (1.549 m).   Weight as of this encounter: 161 lb (73 kg).  Last encounter: 09/27/2023. Last procedure: 10/24/2023.  Reason for encounter: post-procedure evaluation and assessment.   Post-Procedure Evaluation   Type: Genicular Nerves Block (Superolateral, Superomedial, and Inferomedial Genicular Nerves)  #1  Laterality: Right (-RT)  Level: Superior and  inferior to the knee joint.  Imaging: Fluoroscopic guidance Anesthesia: Local anesthesia (1-2% Lidocaine ) DOS: 10/24/2023  Performed by: Wallie Sherry, MD  Purpose: Diagnostic/Therapeutic Indications: Chronic knee pain severe enough to impact quality of life or function. Rationale (medical necessity): procedure needed and proper for the diagnosis and/or treatment of Ms. Radich's medical symptoms and needs. No diagnosis found. NAS-11 Pain score:   Pre-procedure: 8 /10   Post-procedure: 2 /10     Effectiveness:  Initial hour after procedure: 100 %  Subsequent 4-6 hours post-procedure: 100 %  Analgesia past initial 6 hours: 60 %  Ongoing improvement:  Analgesic:  60% Function: Transient improvement ROM: Somewhat improved  Discussed the use of AI scribe software for clinical note transcription with the patient, who gave verbal consent to proceed.  History of Present Illness   Martha Gilmore Pat is an 83 year old female who presents with knee and shoulder pain management.  She experiences significant knee pain, which has been severe enough to elevate her blood pressure to 180 mmHg during a visit to her primary care physician. Tramadol  has been prescribed and found helpful in managing the pain, with her last dose taken the day before this visit.  She also has right shoulder pain, attributed to overuse. She reports that she is primarily left-handed and uses a cane, so she carries most items with her right hand. She has undergone physical therapy and received an injection in the shoulder, which has provided relief.  She mentions that she was able to attend a baseball game six days after receiving a nerve block, an activity she would not have attempted prior to the procedure.       ROS  Constitutional: Denies any fever or chills Gastrointestinal: No reported hemesis, hematochezia, vomiting, or acute GI distress Musculoskeletal: right knee pain Neurological: No reported episodes of  acute onset apraxia, aphasia, dysarthria, agnosia, amnesia, paralysis, loss of coordination, or loss of consciousness  Medication Review  Multiple Vitamins-Minerals, Polyethyl Glycol-Propyl Glycol, Vitamin D3, atorvastatin , citalopram , levothyroxine , omeprazole , and oxybutynin   History Review  Allergy: [name] is allergic to aspirin, demerol  [meperidine hcl], nsaids, penicillins, statins, cephalexin, clindamycin/lincomycin, diclofenac , gabapentin , and tape. Drug: [name]  reports no history of drug use. Alcohol:  reports no history of alcohol use. Tobacco:  reports that she has never smoked. She has been exposed to tobacco smoke. She has never used smokeless tobacco. Social: [name]  reports that she has never smoked. She has been exposed to tobacco smoke. She has never used smokeless tobacco. She reports that she does not drink alcohol and does not use drugs. Medical:  has a past medical history of Allergy, Aortic insufficiency, Arrhythmia, Arthritis, Atrial flutter (HCC) (12/30/2020), Cancer (HCC), Cataracts, bilateral (09/23/2015), Clostridium difficile infection (04/16/2017), Depression, Encounter for annual physical exam (05/09/2023), GERD (gastroesophageal reflux disease), Heart murmur, Hyperlipidemia, OAB (overactive bladder) (12/16/2013), Osteopenia (08/05/2012), Other fatigue (09/25/2022), Seasonal allergies, Sleep apnea, Thyroid  disease, and Urge and stress incontinence. Surgical: [name]  has a past surgical history that includes Cholecystectomy; Cesarean section; Teeth Implants; Appendectomy (1971); Trigger finger release; Vaginal hysterectomy; Cardioversion (N/A, 04/13/2021); A-FLUTTER ABLATION (N/A, 10/14/2021); Eye surgery (cataracts); and Cosmetic surgery. Family: family history includes Arthritis in her mother and sister; Brain cancer in her brother; Cancer in her daughter, father, sister, and sister; Diabetes in her sister; Early death in her sister; Heart disease in her  father, maternal grandmother, mother, and sister; Hypertension in her mother and sister; Kidney cancer in her sister; Kidney disease in her sister; Lung cancer in her sister; Miscarriages / Stillbirths in her mother; Obesity in her father, mother, and sister.  Laboratory Chemistry Profile   Renal Lab Results  Component Value Date   BUN 14 05/08/2023   CREATININE 0.79 05/08/2023   BCR 18 05/08/2023   GFR 57.58 (L) 05/02/2021    Hepatic Lab Results  Component Value Date   AST 16 05/08/2023   ALT 11 05/08/2023   ALBUMIN 4.0 05/08/2023   ALKPHOS 143 (H) 05/08/2023    Electrolytes Lab Results  Component Value Date   NA 142 05/08/2023   K 4.5 05/08/2023   CL 103 05/08/2023   CALCIUM  9.7 05/08/2023   MG 2.1 04/04/2021    Bone Lab Results  Component Value Date   VD25OH 71.5 03/28/2023    Inflammation (CRP: Acute Phase) (ESR: Chronic Phase) Lab Results  Component Value Date   ESRSEDRATE 22 09/27/2020         Note: Above Lab results reviewed.  Recent Imaging Review  DG Shoulder Right CLINICAL DATA:  Right shoulder pain and weakness. Decreased range of motion.  EXAM: RIGHT SHOULDER - 2+ VIEW  COMPARISON:  None Available.  FINDINGS: There is no evidence of fracture or dislocation. Mild acromioclavicular degenerative spurring. There is subchondral cystic change in the lateral humeral head. Soft tissues are unremarkable.  IMPRESSION: 1. Mild acromioclavicular degenerative spurring. 2. Subchondral cystic change in the lateral humeral head, can be seen with rotator cuff pathology.  Electronically Signed   By: Andrea Gasman M.D.   On: 11/12/2023 22:00 CLINICAL DATA:  Right knee pain for 2 weeks, no trauma   EXAM: RIGHT KNEE 3 VIEWS   COMPARISON:  None  Available.   FINDINGS: Frontal, lateral, and sunrise views of the right knee are obtained. No acute fracture, subluxation, or dislocation. There is 3 compartmental osteoarthritis, greatest in the medial  compartment with moderate to severe joint space narrowing. No joint effusion. Soft tissues are unremarkable.   IMPRESSION: 1. Three compartmental osteoarthritis, greatest in the medial compartment. 2. No acute bony abnormality.  Note: Reviewed        Physical Exam  Vitals: BP (!) 151/47   Pulse 65   Temp (!) 97.5 F (36.4 C)   Ht 5' 1 (1.549 m)   Wt 161 lb (73 kg)   SpO2 99%   BMI 30.42 kg/m  BMI: Estimated body mass index is 30.42 kg/m as calculated from the following:   Height as of this encounter: 5' 1 (1.549 m).   Weight as of this encounter: 161 lb (73 kg). Ideal: Ideal body weight: 47.8 kg (105 lb 6.1 oz) Adjusted ideal body weight: 57.9 kg (127 lb 10 oz) General appearance: Well nourished, well developed, and well hydrated. In no apparent acute distress Mental status: Alert, oriented x 3 (person, place, & time)       Respiratory: No evidence of acute respiratory distress Eyes: PERLA   Assessment   Diagnosis  1. Primary osteoarthritis of right knee   2. Chronic pain of right knee        Plan of Care  Assessment and Plan    Chronic right knee pain Chronic right knee pain is exacerbated by activity. Tramadol  provides temporary relief. Considering genicular nerve ablation for long-term pain management. The procedure involves heating the nerve to reduce pain, with the possibility of nerve regeneration requiring repeat procedures. Sedation during the procedure is an option due to the larger needle and heat application. Post-procedure, increased pain may occur initially, with improvement expected after 2-3 days. Schedule genicular nerve ablation with IV sedation after her vacation. Advise her to rest for 3-4 days post-ablation and inform her about the potential for increased pain initially, with improvement expected after 2-3 days.         Orders:  Orders Placed This Encounter  Procedures   Radiofrequency,Genicular    Standing Status:   Future    Expiration  Date:   02/22/2024    Scheduling Instructions:     Procedure: Genicular nerve(s) Cooled Radiofrequency Ablation (CRFA)     Side(s):RIGHT     Level(s): Superior-Lateral, Superior-Medial, and Inferior-Medial Genicular Nerve(s)     Sedation: IV versed     Scheduling Timeframe: As soon as pre-approved    Where will this procedure be performed?:   [facility]     Right genicular nerve block: 10/24/2023    Return in about 18 days (around 12/10/2023) for R genicular RFA , in clinic IV Versed.    Recent Visits Date Type Provider Dept  10/24/23 Procedure visit Marcelino Nurse, MD [facility]  09/27/23 Office Visit Marcelino Nurse, MD [facility]  Showing recent visits within past 90 days and meeting all other requirements Today's Visits Date Type Provider Dept  11/22/23 Office Visit Marcelino Nurse, MD [facility]  Showing today's visits and meeting all other requirements Future Appointments No visits were found meeting these conditions. Showing future appointments within next 90 days and meeting all other requirements  I discussed the assessment and treatment plan with the patient. The patient was provided an opportunity to ask questions and all were answered. The patient agreed with the plan and demonstrated an understanding  of the instructions.  Patient advised to call back or seek an in-person evaluation if the symptoms or condition worsens.  Duration of encounter: .  Total time on encounter, as per AMA guidelines included both the face-to-face and non-face-to-face time personally spent by the physician and/or other qualified health care professional(s) on the day of the encounter (includes time in activities that require the physician or other qualified health care professional and does not include time in activities normally performed by clinical staff). Physician's time may include the following activities when performed: Preparing to see the  patient (e.g., pre-charting review of records, searching for previously ordered imaging, lab work, and nerve conduction tests) Review of prior analgesic pharmacotherapies. Reviewing PMP Interpreting ordered tests (e.g., lab work, imaging, nerve conduction tests) Performing post-procedure evaluations, including interpretation of diagnostic procedures Obtaining and/or reviewing separately obtained history Performing a medically appropriate examination and/or evaluation Counseling and educating the patient/family/caregiver Ordering medications, tests, or procedures Referring and communicating with other health care professionals (when not separately reported) Documenting clinical information in the electronic or other health record Independently interpreting results (not separately reported) and communicating results to the patient/ family/caregiver Care coordination (not separately reported)  Note by: Wallie Sherry, MD (TTS and AI technology used. I apologize for any typographical errors that were not detected and corrected.) Date: 11/22/2023; Time: 9:14 AM

## 2023-11-22 NOTE — Progress Notes (Signed)
 Safety precautions to be maintained throughout the outpatient stay will include: orient to surroundings, keep bed in low position, maintain call bell within reach at all times, provide assistance with transfer out of bed and ambulation.

## 2023-12-10 ENCOUNTER — Ambulatory Visit: Attending: Family Medicine | Admitting: Physical Therapy

## 2023-12-10 ENCOUNTER — Other Ambulatory Visit: Payer: Self-pay

## 2023-12-10 ENCOUNTER — Encounter: Payer: Self-pay | Admitting: Physical Therapy

## 2023-12-10 ENCOUNTER — Encounter: Payer: Self-pay | Admitting: Nurse Practitioner

## 2023-12-10 DIAGNOSIS — S46009A Unspecified injury of muscle(s) and tendon(s) of the rotator cuff of unspecified shoulder, initial encounter: Secondary | ICD-10-CM | POA: Diagnosis not present

## 2023-12-10 DIAGNOSIS — M25511 Pain in right shoulder: Secondary | ICD-10-CM | POA: Insufficient documentation

## 2023-12-10 DIAGNOSIS — M6281 Muscle weakness (generalized): Secondary | ICD-10-CM | POA: Insufficient documentation

## 2023-12-10 NOTE — Therapy (Signed)
 OUTPATIENT PHYSICAL THERAPY SHOULDER EVALUATION   Patient Name: Martha Gilmore MRN: [id number] DOB:25-Sep-1940, 83 y.o., female Today's Date: 12/10/2023   PT End of Session - 12/10/23 1211     Visit Number 1    Number of Visits --   1-2x/week   Date for PT Re-Evaluation 02/04/24    Authorization Type [employer] MCR    Progress Note Due on Visit 10    PT Start Time 1145    PT Stop Time 1210    PT Time Calculation (min) 25 min          Past Medical History:  Diagnosis Date   Allergy    Aortic insufficiency    mild to moderate by echo 12/2020   Arrhythmia    Arthritis    Atrial flutter (HCC) 12/30/2020   Cancer (HCC)    basal cell   Cataracts, bilateral 09/23/2015   Clostridium difficile infection 04/16/2017   H/o C.diff due to clindamicyin 09/2016; two rounds of treatment for cure. Dr. Rollin   Depression    Encounter for annual physical exam 05/09/2023   GERD (gastroesophageal reflux disease)    Heart murmur    Hyperlipidemia    OAB (overactive bladder) 12/16/2013   Osteopenia 08/05/2012   Dexa scan 07/2012 mild osteopenia, T = -1.1   Other fatigue 09/25/2022   Seasonal allergies    Sleep apnea    Thyroid  disease    Urge and stress incontinence    Past Surgical History:  Procedure Laterality Date   A-FLUTTER ABLATION N/A 10/14/2021   Procedure: A-FLUTTER ABLATION;  Surgeon: Inocencio Soyla Lunger, MD;  Location: [facility];  Service: Cardiovascular;  Laterality: N/A;   APPENDECTOMY  1971   CARDIOVERSION N/A 04/13/2021   Procedure: CARDIOVERSION;  Surgeon: Santo Stanly LABOR, MD;  Location: [facility];  Service: Cardiovascular;  Laterality: N/A;   CESAREAN SECTION     CHOLECYSTECTOMY     COSMETIC SURGERY     Mohs/end of nose   EYE SURGERY  cataracts   Teeth Implants     TRIGGER FINGER RELEASE     VAGINAL HYSTERECTOMY     Patient Active Problem List   Diagnosis Date Noted   Acute pain of right shoulder 11/12/2023   Fall 05/29/2023   Encounter for  annual physical exam 05/09/2023   Prediabetes 05/09/2023   Acquired hypothyroidism 05/09/2023   Vitamin D  deficiency 05/09/2023   Paresthesia of hand, bilateral 10/22/2022   Balance problems 10/22/2022   Dyspnea on exertion 10/22/2022   Arthritis 10/22/2022   Chronic neck pain 12/14/2021   Arthropathy of cervical facet joint 12/14/2021   Spondylosis, cervical 12/14/2021   Hypertension 07/04/2021   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) 05/02/2021   Long-term use of high-risk medication 04/07/2021   Nonrheumatic aortic valve insufficiency 04/07/2021   Obstructive sleep apnea 03/28/2021   Aortic insufficiency 01/18/2021   Atrial flutter (HCC) 12/30/2020   Chondromalacia, knee, right 11/05/2017   Tricompartment osteoarthritis of right knee 11/05/2017   Major depression, recurrent, chronic (HCC) 04/16/2017   Mixed hyperlipidemia 04/16/2017   Clostridium difficile infection 04/16/2017   Family history of early CAD 04/16/2017   Bilateral sensorineural hearing loss 02/06/2016   Postablative hypothyroidism 12/20/2015   History of Graves' disease 03/22/2015   OAB (overactive bladder) 12/16/2013   GERD (gastroesophageal reflux disease) 12/13/2012   Osteoarthritis of CMC joint of thumb 08/13/2012   Seasonal allergies 08/13/2012   Urge and stress incontinence 08/13/2012   Osteopenia 08/05/2012   DDD (degenerative  disc disease), lumbosacral 10/22/2010    PCP: Oris Camie BRAVO, NP  REFERRING PROVIDER: Vita Morrow, MD  THERAPY DIAG:  Right shoulder pain, unspecified chronicity - Plan: PT plan of care cert/re-cert  Muscle weakness - Plan: PT plan of care cert/re-cert  REFERRING DIAG: Rotator cuff injury, initial encounter [S46.009A]   Rationale for Evaluation and Treatment:  Rehabilitation  SUBJECTIVE:  PERTINENT PAST HISTORY:  Hearing loss, DDD, depression, osteopenia, R knee replacement      PRECAUTIONS: None  WEIGHT BEARING RESTRICTIONS  No  FALLS:  Has patient fallen in last 6 months? Yes, Number of falls: 1 tipped going through doorway  MOI/History of condition:  Onset date: ~ 3 months  SUBJECTIVE STATEMENT  Pt is a 83 y.o. female who presents to clinic with chief complaint of R shoulder pain which started about 3 months ago.  She is having no pain following steroid injection.  MD would like her to strengthen.  She uses a SPC d/t R knee pain and thinks that using her R hand to carry things may have caused her shoulder pain.  Abd and general activity using the shoulder increased her pain before the injection  Pain:  Are you having pain? No, none currently  Occupation: NA  Assistive Device: SPC  Hand Dominance: L  Patient Goals/Specific Activities: get strengthening exercises for shoulder   OBJECTIVE:   DIAGNOSTIC FINDINGS:  Independent interpretation of previous x-ray done on 11/12/2023 shows no signs of acute trauma. No noted significant arthritic changes. Mild degenerative changes noted. Noted AC joint arthropathy.   IMPRESSION: 1. Mild acromioclavicular degenerative spurring. 2. Subchondral cystic change in the lateral humeral head, can be seen with rotator cuff pathology.  GENERAL OBSERVATION: Rounded shoulder posture     SENSATION: Light touch: Appears intact   PALPATION: No TTP on exam  UPPER EXTREMITY AROM:  ROM Right (Eval) Left (Eval)  Shoulder flexion 140 135  Shoulder abduction 135 135  Shoulder internal rotation    Shoulder external rotation    Functional IR n n  Functional ER n n  Shoulder extension    Elbow extension    Elbow flexion     (Blank rows = not tested, N = WNL, * = concordant pain with testing)  UPPER EXTREMITY MMT:  MMT Right (Eval) Left (Eval)  Shoulder flexion 4 4+  Shoulder abduction (C5)    Shoulder ER 4 4+  Shoulder IR 4 4+  Middle trapezius    Lower trapezius    Shoulder extension    Grip strength    Cervical flexion (C1,C2)    Cervical S/B  (C3)    Shoulder shrug (C4)    Elbow flexion (C6)    Elbow ext (C7)    Thumb ext (C8)    Finger abd (T1)    Grossly     (Blank rows = not tested, score listed is out of 5 possible points.  N = WNL, D = diminished, C = clear for gross weakness with myotome testing, * = concordant pain with testing)  UPPER EXTREMITY PROM:  PROM Right (Eval) Left (Eval)  Shoulder flexion    Shoulder abduction    Shoulder internal rotation    Shoulder external rotation    Functional IR    Functional ER    Shoulder extension    Elbow extension    Elbow flexion     (Blank rows = not tested, N = WNL, * = concordant pain with testing)   PATIENT SURVEYS:  QuickDASH: QuickDASH  Score: 27.3 / 100 = 27.3 %    TODAY'S TREATMENT:  Therapeutic Exercise: Creating, reviewing, and completing below HEP   PATIENT EDUCATION ([place]/[facility]):  POC, diagnosis, prognosis, HEP, and outcome measures.  Pt educated via explanation, demonstration, and handout (HEP).  Pt confirms understanding verbally.   HOME EXERCISE PROGRAM: Access Code: [facility] URL: [email or web address] Date: 12/10/2023 Prepared by: Helene Gasmen  Exercises - Sidelying Shoulder Flexion 15 Degrees  - 1 x daily - 4-7 x weekly - 3 sets - 10-15 reps - Sidelying Shoulder Horizontal Abduction  - 1 x daily - 4-7 x weekly - 3 sets - 10-15 reps - Sidelying Shoulder External Rotation  - 1 x daily - 4-7 x weekly - 3 sets - 10-15 reps  Treatment priorities   Eval        Progressive cuff and periscapular strengthening        Robust HEP                                  ASSESSMENT:  CLINICAL IMPRESSION: Martha Gilmore is a 83 y.o. female who presents to clinic with history R shoulder pain ongoing for about 3 months to no specific inciting event or trauma.   No gross abnormalities on x-ray.  Possibly R/C related pain which was reduced with injection.  Will progress to well rounded HEP over a few visits.   Martha Gilmore will benefit from skilled  PT to address relevant deficits and improve shoulder function.   OBJECTIVE IMPAIRMENTS: Pain, R shoulder strength, R shoulder ROM  ACTIVITY LIMITATIONS: lifting, reaching, housework, selfcare, using cane  PERSONAL FACTORS: See medical history and pertinent history   REHAB POTENTIAL: Good  CLINICAL DECISION MAKING: Evolving/moderate complexity  EVALUATION COMPLEXITY: Moderate   GOALS:   SHORT TERM GOALS: Target date: 01/07/2024   Aide will be >75% HEP compliant to improve carryover between sessions and facilitate independent management of condition  Evaluation: ongoing Goal status: INITIAL   LONG TERM GOALS: Target date: 02/04/2024   Martha Gilmore will self report no pain with progressive strengthening exercises  Evaluation/Baseline:  Goal status: INITIAL   2.  Martha Gilmore will show a >/= 10 pct improvement in her QUICK DASH score (MCID is 10% or ~5 pts) as a proxy for functional improvement   Evaluation/Baseline: QuickDASH Score: 27.3 / 100 = 27.3 % Goal status: INITIAL   3.  Martha Gilmore will be able to regularly participate in resistance training including pushing, pulling, and OH pressing not limited by pain or weakness  Evaluation/Baseline: no regular resistance training Goal status: INITIAL   4.  Martha Gilmore will improve the following MMTs to >/= 4+/5 to show improvement in strength:  R shoulder ER and flexion   Evaluation/Baseline: see chart in note Goal status: INITIAL     PLAN: PT FREQUENCY: 1-2x/week  PT DURATION: 8 weeks  PLANNED INTERVENTIONS:  [id number]- PT Re-evaluation, 97110-Therapeutic exercises, 97530- Therapeutic activity, 97112- Neuromuscular re-education, 97535- Self Care, 02859- Manual therapy, [id number]- Gait training, [id number]- Aquatic Therapy, [phone]- Electrical stimulation (manual), [id number]- Vasopneumatic device, [id number]- Traction (mechanical), [id number]- Ionotophoresis 4mg /ml Dexamethasone , Taping, Dry Needling, Joint manipulation, and Spinal  manipulation.   Helene Gasmen PT, DPT 12/10/2023, 12:27 PM  I just finished a [employer] eval/recert.  Name: Martha Gilmore  MRN: [id number] Please request 1x/week for 8 weeks.  Check all conditions that are expected to impact treatment: Musculoskeletal disorders   I DID put a charge in.  Check all possible CPT codes: 02889-  Therapeutic Exercise, [id number]- Neuro Re-education, [id number] - Gait Training, 02859 - Manual Therapy, [id number] - Therapeutic Activities, [phone] - Self Care, [phone] - Re-evaluation, [id number] - Mechanical traction, and [id number] - Aquatic therapy   Thank you!   Date of referral: 7/28 Referring provider: Vita Morrow, MD  Referring diagnosis? Rotator cuff injury, initial encounter [S46.009A]  Treatment diagnosis? (if different than referring diagnosis) Right shoulder pain, unspecified chronicity  Muscle weakness  What was this (referring dx) caused by? Unspecified  Nature of Condition: Initial Onset (within last 3 months)   Laterality: Rt  Current Functional Measure Score: DASH QuickDASH Score: 27.3 / 100 = 27.3 %  Objective measurements identify impairments when they are compared to normal values, the uninvolved extremity, and prior level of function.  [x]  Yes  []  No  Objective assessment of functional ability: Minimal functional limitations   Briefly describe symptoms: history of R shoulder pain which is reduced following injection but strength deficits remain on R  How did symptoms start: slow onset  Average pain intensity:  Last 24 hours: 0  Past week: 0  How often does the pt experience symptoms? Occasionally  How much have the symptoms interfered with usual daily activities? A little bit  How has condition changed since care began at this facility? NA - initial visit  In general, how is the patients overall health? Good   BACK PAIN (STarT Back Screening Tool) No

## 2023-12-12 ENCOUNTER — Ambulatory Visit: Admitting: Student in an Organized Health Care Education/Training Program

## 2023-12-12 MED ORDER — TRAMADOL HCL 50 MG PO TABS: 50.0000 mg | ORAL_TABLET | Freq: Three times a day (TID) | ORAL | 0 refills | Status: DC | PRN

## 2023-12-19 ENCOUNTER — Encounter: Payer: Self-pay | Admitting: Student in an Organized Health Care Education/Training Program

## 2023-12-19 ENCOUNTER — Ambulatory Visit: Admitting: Student in an Organized Health Care Education/Training Program

## 2023-12-19 ENCOUNTER — Ambulatory Visit
Admission: RE | Admit: 2023-12-19 | Discharge: 2023-12-19 | Disposition: A | Discharge status: Home / Self Care | Source: Ambulatory Visit | Attending: Student in an Organized Health Care Education/Training Program | Admitting: Student in an Organized Health Care Education/Training Program

## 2023-12-19 DIAGNOSIS — M1711 Unilateral primary osteoarthritis, right knee: Secondary | ICD-10-CM

## 2023-12-19 DIAGNOSIS — M25561 Pain in right knee: Secondary | ICD-10-CM

## 2023-12-19 DIAGNOSIS — G8929 Other chronic pain: Secondary | ICD-10-CM

## 2023-12-19 MED ORDER — ROPIVACAINE HCL 2 MG/ML IJ SOLN
9.0000 mL | Freq: Once | INTRAMUSCULAR | Status: AC
  Administered 2023-12-19: 9 mL via PERINEURAL
  Filled 2023-12-19: qty 20

## 2023-12-19 MED ORDER — DEXAMETHASONE SODIUM PHOSPHATE 10 MG/ML IJ SOLN
10.0000 mg | Freq: Once | INTRAMUSCULAR | Status: AC
  Administered 2023-12-19: 10 mg
  Filled 2023-12-19: qty 1

## 2023-12-19 MED ORDER — LIDOCAINE HCL 2 % IJ SOLN
20.0000 mL | Freq: Once | INTRAMUSCULAR | Status: AC
  Administered 2023-12-19: 400 mg
  Filled 2023-12-19: qty 20

## 2023-12-19 MED ORDER — MIDAZOLAM HCL 2 MG/2ML IJ SOLN
0.5000 mg | Freq: Once | INTRAMUSCULAR | Status: DC
  Filled 2023-12-19: qty 2

## 2023-12-19 MED ORDER — LACTATED RINGERS IV SOLN: Freq: Once | INTRAVENOUS | Status: DC

## 2023-12-19 NOTE — Patient Instructions (Signed)

## 2023-12-19 NOTE — Progress Notes (Signed)
 PROVIDER NOTE: Interpretation of information contained herein should be left to medically-trained personnel. Specific patient instructions are provided elsewhere under Patient Instructions section of medical record. This document was created in part using STT-dictation technology, any transcriptional errors that may result from this process are unintentional.  Patient: Martha Gilmore Type: Established DOB: 10/26/1940 MRN: [id number] PCP: Oris Camie BRAVO, NP  Service: Procedure DOS: 12/19/2023 Setting: Ambulatory Location: Ambulatory outpatient facility Delivery: Face-to-face Provider: Wallie Sherry, MD Specialty: Interventional Pain Management Specialty designation: 09 Location: Outpatient facility Ref. Prov.: Sherry Wallie, MD       Interventional Therapy     Procedure:          Anesthesia, Analgesia, Anxiolysis:  Type: Therapeutic Superolateral, Superomedial, and Inferomedial, Genicular Nerve Radiofrequency Ablation (destruction).   #1  Region: Lateral, Anterior, and Medial aspects of the knee joint, above and below the knee joint proper. Level: Superior and inferior to the knee joint. Laterality: Right  Anesthesia: Local (1-2% Lidocaine )  Sedation: None  Guidance: Fluoroscopy           Position: Supine   Indications: 1. Primary osteoarthritis of right knee   2. Chronic pain of right knee    Ms. Martha Gilmore has been dealing with the above chronic pain for longer than three months and has either failed to respond, was unable to tolerate, or simply did not get enough benefit from other more conservative therapies including, but not limited to: 1. Over-the-counter medications 2. Anti-inflammatory medications 3. Muscle relaxants 4. Membrane stabilizers 5. Opioids 6. Physical therapy and/or chiropractic manipulation 7. Modalities (Heat, ice, etc.) 8. Invasive techniques such as nerve blocks. Ms. Martha Gilmore has attained more than 50% relief of the pain from a series of diagnostic injections  conducted in separate occasions.  Pain Score: Pre-procedure: 8 /10 Post-procedure: 0-No pain/10    H&P (Pre-op Assessment):  Ms. Martha Gilmore is a 83 y.o. (year old), female patient, seen today for interventional treatment. She  has a past surgical history that includes Cholecystectomy; Cesarean section; Teeth Implants; Appendectomy (1971); Trigger finger release; Vaginal hysterectomy; Cardioversion (N/A, 04/13/2021); A-FLUTTER ABLATION (N/A, 10/14/2021); Eye surgery (cataracts); and Cosmetic surgery. Ms. Martha Gilmore has a current medication list which includes the following prescription(s): atorvastatin , vitamin d3, citalopram , levothyroxine , multiple vitamins-minerals, omeprazole , oxybutynin , polyethyl glycol-propyl glycol, tramadol , and vitamin k2-vitamin d3. Her primarily concern today is the Knee Pain (right)  Initial Vital Signs:  Pulse/HCG Rate: 64ECG Heart Rate: 60 Temp: (!) 97 F (36.1 C) Resp: 16 BP: (!) 141/70 SpO2: 97 %  BMI: Estimated body mass index is 30.23 kg/m as calculated from the following:   Height as of this encounter: 5' 1 (1.549 m).   Weight as of this encounter: 160 lb (72.6 kg).  Risk Assessment: Allergies: Reviewed. She is allergic to aspirin, demerol  [meperidine hcl], nsaids, penicillins, statins, cephalexin, clindamycin/lincomycin, diclofenac , gabapentin , and tape.  Allergy Precautions: None required Coagulopathies: Reviewed. None identified.  Blood-thinner therapy: None at this time Active Infection(s): Reviewed. None identified. Ms. Martha Gilmore is afebrile  Site Confirmation: Ms. Martha Gilmore was asked to confirm the procedure and laterality before marking the site Procedure checklist: Completed Consent: Before the procedure and under the influence of no sedative(s), amnesic(s), or anxiolytics, the patient was informed of the treatment options, risks and possible complications. To fulfill our ethical and legal obligations, as recommended by the American Medical  Association's Code of Ethics, I have informed the patient of my clinical impression; the nature and purpose of the treatment or procedure; the risks, benefits, and possible complications  of the intervention; the alternatives, including doing nothing; the risk(s) and benefit(s) of the alternative treatment(s) or procedure(s); and the risk(s) and benefit(s) of doing nothing. The patient was provided information about the general risks and possible complications associated with the procedure. These may include, but are not limited to: failure to achieve desired goals, infection, bleeding, organ or nerve damage, allergic reactions, paralysis, and death. In addition, the patient was informed of those risks and complications associated to the procedure, such as failure to decrease pain; infection; bleeding; organ or nerve damage with subsequent damage to sensory, motor, and/or autonomic systems, resulting in permanent pain, numbness, and/or weakness of one or several areas of the body; allergic reactions; (i.e.: anaphylactic reaction); and/or death. Furthermore, the patient was informed of those risks and complications associated with the medications. These include, but are not limited to: allergic reactions (i.e.: anaphylactic or anaphylactoid reaction(s)); adrenal axis suppression; blood sugar elevation that in diabetics may result in ketoacidosis or comma; water retention that in patients with history of congestive heart failure may result in shortness of breath, pulmonary edema, and decompensation with resultant heart failure; weight gain; swelling or edema; medication-induced neural toxicity; particulate matter embolism and blood vessel occlusion with resultant organ, and/or nervous system infarction; and/or aseptic necrosis of one or more joints. Finally, the patient was informed that Medicine is not an exact science; therefore, there is also the possibility of unforeseen or unpredictable risks and/or possible  complications that may result in a catastrophic outcome. The patient indicated having understood very clearly. We have given the patient no guarantees and we have made no promises. Enough time was given to the patient to ask questions, all of which were answered to the patient's satisfaction. Ms. Martha Gilmore has indicated that she wanted to continue with the procedure. Attestation: I, the ordering provider, attest that I have discussed with the patient the benefits, risks, side-effects, alternatives, likelihood of achieving goals, and potential problems during recovery for the procedure that I have provided informed consent. Date  Time: 12/19/2023  9:47 AM  Pre-Procedure Preparation:  Monitoring: As per clinic protocol. Respiration, ETCO2, SpO2, BP, heart rate and rhythm monitor placed and checked for adequate function Safety Precautions: Patient was assessed for positional comfort and pressure points before starting the procedure. Time-out: I initiated and conducted the Time-out before starting the procedure, as per protocol. The patient was asked to participate by confirming the accuracy of the Time Out information. Verification of the correct person, site, and procedure were performed and confirmed by me, the nursing staff, and the patient. Time-out conducted as per Joint Commission's Universal Protocol (UP.01.01.01). Time: 1020 Start Time: 1021 hrs.  Description of Procedure:          Target Area: For Genicular Nerve radiofrequency ablation (destruction), the targets are: the superolateral genicular nerve, located in the lateral distal portion of the femoral shaft as it curves to form the lateral epicondyle, in the region of the distal femoral metaphysis; the superomedial genicular nerve, located in the medial distal portion of the femoral shaft as it curves to form the medial epicondyle; and the inferomedial genicular nerve, located in the medial, proximal portion of the tibial shaft, as it curves  to form the medial epicondyle, in the region of the proximal tibial metaphysis. Approach: Anterior, ipsilateral approach. Area Prepped: Entire knee area, from mid-thigh to mid-shin, lateral, anterior, and medial aspects. ChloraPrep (2% chlorhexidine gluconate and 70% isopropyl alcohol) Safety Precautions: Aspiration looking for blood return was conducted prior to all injections.  At no point did we inject any substances, as a needle was being advanced. No attempts were made at seeking any paresthesias. Safe injection practices and needle disposal techniques used. Medications properly checked for expiration dates. SDV (single dose vial) medications used. Description of the Procedure: Protocol guidelines were followed. The patient was placed in position over the procedure table. The target area was identified and the area prepped in the usual manner. The skin and muscle were infiltrated with local anesthetic. Appropriate amount of time allowed to pass for local anesthetics to take effect. Radiofrequency needles were introduced to the target area using fluoroscopic guidance. Using the NeuroTherm NT1100 Radiofrequency Generator, sensory stimulation using 50 Hz was used to locate & identify the nerve, making sure that the needle was positioned such that there was no sensory stimulation below 0.3 V or above 0.7 V. Stimulation using 2 Hz was used to evaluate the motor component. Care was taken not to lesion any nerves that demonstrated motor stimulation of the lower extremities at an output of less than 2.5 times that of the sensory threshold, or a maximum of 2.0 V. Once satisfactory placement of the needles was achieved, the numbing solution was slowly injected after negative aspiration. After waiting for at least 2 minutes, the ablation was performed at 80 degrees C for 60 seconds, using regular Radiofrequency settings. Once the procedure was completed, the needles were then removed and the area cleansed, making sure  to leave some of the prepping solution back to take advantage of its long term bactericidal properties. Intra-operative Compliance: Compliant           Vitals:   12/19/23 1010 12/19/23 1020 12/19/23 1028 12/19/23 1037  BP: (!) 158/63 (!) 154/71 (!) 150/71 (!) 158/68  Pulse:      Resp: 15 16 15 15   Temp:      SpO2:  99% 98% 98%  Weight:      Height:        Start Time: 1021 hrs. End Time: 1032 hrs. Materials & Medications:  Needle(s) Type: Teflon-coated, curved tip, Radiofrequency needle(s) Gauge: 22G Length: 10cm Medication(s): Please see orders for medications and dosing details.  Imaging Guidance (Non-Spinal):          Type of Imaging Technique: Fluoroscopy Guidance (Non-Spinal) Indication(s): Fluoroscopy guidance for needle placement to enhance accuracy in procedures requiring precise needle localization for targeted delivery of medication in or near specific anatomical locations not easily accessible without such real-time imaging assistance. Exposure Time: Please see nurses notes. Contrast: Before injecting any contrast, we confirmed that the patient did not have an allergy to iodine, shellfish, or radiological contrast. Once satisfactory needle placement was completed at the desired level, radiological contrast was injected. Contrast injected under live fluoroscopy. No contrast complications. See chart for type and volume of contrast used. Fluoroscopic Guidance: I was personally present during the use of fluoroscopy. Tunnel Vision Technique used to obtain the best possible view of the target area. Parallax error corrected before commencing the procedure. Direction-depth-direction technique used to introduce the needle under continuous pulsed fluoroscopy. Once target was reached, antero-posterior, oblique, and lateral fluoroscopic projection used confirm needle placement in all planes. Images permanently stored in EMR. Interpretation: I personally interpreted the imaging  intraoperatively. Adequate needle placement confirmed in multiple planes. Appropriate spread of contrast into desired area was observed. No evidence of afferent or efferent intravascular uptake. Permanent images saved into the patient's record.  Antibiotic Prophylaxis:   Anti-infectives (From admission, onward)    None  Indication(s): None identified  Post-operative Assessment:  Post-procedure Vital Signs:  Pulse/HCG Rate: 6460 Temp: (!) 97 F (36.1 C) Resp: 15 BP: (!) 158/68 SpO2: 98 %  EBL: None  Complications: No immediate post-treatment complications observed by team, or reported by patient.  Note: The patient tolerated the entire procedure well. A repeat set of vitals were taken after the procedure and the patient was kept under observation following institutional policy, for this type of procedure. Post-procedural neurological assessment was performed, showing return to baseline, prior to discharge. The patient was provided with post-procedure discharge instructions, including a section on how to identify potential problems. Should any problems arise concerning this procedure, the patient was given instructions to immediately contact us , at any time, without hesitation. In any case, we plan to contact the patient by telephone for a follow-up status report regarding this interventional procedure.  Comments:  No additional relevant information.  Plan of Care (POC)  Orders:  Orders Placed This Encounter  Procedures   DG PAIN CLINIC C-ARM 1-60 MIN NO REPORT    Intraoperative interpretation by procedural physician at [facility].    Standing Status:   Standing    Number of Occurrences:   1    Reason for exam::   Assistance in needle guidance and placement for procedures requiring needle placement in or near specific anatomical locations not easily accessible without such assistance.   Medications ordered for procedure: Meds ordered this encounter  Medications    lidocaine  (XYLOCAINE ) 2 % (with pres) injection 400 mg   DISCONTD: lactated ringers  infusion   DISCONTD: midazolam  (VERSED ) injection 0.5-2 mg    Make sure Flumazenil is available in the pyxis when using this medication. If oversedation occurs, administer 0.2 mg IV over 15 sec. If after 45 sec no response, administer 0.2 mg again over 1 min; may repeat at 1 min intervals; not to exceed 4 doses (1 mg)   ropivacaine  (PF) 2 mg/mL (0.2%) (NAROPIN ) injection 9 mL   dexamethasone  (DECADRON ) injection 10 mg   Medications administered: We administered lidocaine , ropivacaine  (PF) 2 mg/mL (0.2%), and dexamethasone .  See the medical record for exact dosing, route, and time of administration.    Right genicular nerve block: 10/24/2023, RFA 12/19/23     Follow-up plan:   Return in about 7 weeks (around 02/06/2024) for PPE, F2F.     Recent Visits Date Type Provider Dept  11/22/23 Office Visit Marcelino Nurse, MD [facility]  10/24/23 Procedure visit Marcelino Nurse, MD [facility]  09/27/23 Office Visit Marcelino Nurse, MD [facility]  Showing recent visits within past 90 days and meeting all other requirements Today's Visits Date Type Provider Dept  12/19/23 Procedure visit Marcelino Nurse, MD [facility]  Showing today's visits and meeting all other requirements Future Appointments Date Type Provider Dept  02/07/24 Appointment Marcelino Nurse, MD [facility]  Showing future appointments within next 90 days and meeting all other requirements   Disposition: Discharge home  Discharge (Date  Time): 12/19/2023; 1041 hrs.   Primary Care Physician: Oris Camie BRAVO, NP Location: [facility] Note by: Nurse Marcelino, MD (TTS technology used. I apologize for any typographical errors that were not detected and corrected.) Date: 12/19/2023; Time: 12:10 PM  Disclaimer:  Medicine is not an Visual merchandiser. The only guarantee in medicine  is that nothing is guaranteed. It is important to note that the decision to proceed with this intervention was based on the information collected from  the patient. The Data and conclusions were drawn from the patient's questionnaire, the interview, and the physical examination. Because the information was provided in large part by the patient, it cannot be guaranteed that it has not been purposely or unconsciously manipulated. Every effort has been made to obtain as much relevant data as possible for this evaluation. It is important to note that the conclusions that lead to this procedure are derived in large part from the available data. Always take into account that the treatment will also be dependent on availability of resources and existing treatment guidelines, considered by other Pain Management Practitioners as being common knowledge and practice, at the time of the intervention. For Medico-Legal purposes, it is also important to point out that variation in procedural techniques and pharmacological choices are the acceptable norm. The indications, contraindications, technique, and results of the above procedure should only be interpreted and judged by a Board-Certified Interventional Pain Specialist with extensive familiarity and expertise in the same exact procedure and technique.

## 2023-12-19 NOTE — Progress Notes (Signed)
 Safety precautions to be maintained throughout the outpatient stay will include: orient to surroundings, keep bed in low position, maintain call bell within reach at all times, provide assistance with transfer out of bed and ambulation.

## 2023-12-20 ENCOUNTER — Telehealth: Payer: Self-pay | Admitting: *Deleted

## 2023-12-20 NOTE — Telephone Encounter (Signed)
 No problems post procedure.

## 2023-12-25 ENCOUNTER — Ambulatory Visit: Admitting: Physical Therapy

## 2024-01-01 ENCOUNTER — Ambulatory Visit: Admitting: Family Medicine

## 2024-01-01 ENCOUNTER — Encounter: Payer: Self-pay | Admitting: Family Medicine

## 2024-01-01 ENCOUNTER — Ambulatory Visit: Admitting: Physical Therapy

## 2024-01-01 VITALS — BP 124/82 | HR 61 | Wt 162.4 lb

## 2024-01-01 DIAGNOSIS — M25561 Pain in right knee: Secondary | ICD-10-CM

## 2024-01-01 DIAGNOSIS — G8929 Other chronic pain: Secondary | ICD-10-CM | POA: Diagnosis not present

## 2024-01-01 DIAGNOSIS — M25511 Pain in right shoulder: Secondary | ICD-10-CM | POA: Diagnosis not present

## 2024-01-01 DIAGNOSIS — M65342 Trigger finger, left ring finger: Secondary | ICD-10-CM

## 2024-01-01 MED ORDER — TRAMADOL HCL 50 MG PO TABS: 50.0000 mg | ORAL_TABLET | Freq: Three times a day (TID) | ORAL | 0 refills | Status: DC | PRN

## 2024-01-01 NOTE — Progress Notes (Signed)
 Name: Martha Gilmore   Date of Visit: 01/01/24   Date of last visit with me: 11/19/2023   CHIEF COMPLAINT:  Chief Complaint  Patient presents with   Follow-up    6 week follow up. Doing great, has not started PT.        HPI:  Discussed the use of AI scribe software for clinical note transcription with the patient, who gave verbal consent to proceed.  History of Present Illness   Martha Gilmore Pat is an 83 year old female who presents with ongoing knee pain post-radiofrequency ablation.  She experiences persistent knee pain following a radiofrequency ablation procedure performed approximately two weeks ago. The pain has not subsided, and she was informed that the nerve could take up to three weeks to die, with symptoms potentially worsening before improving. She manages the pain with tramadol , which she received from another provider, and has three pills remaining, using them sparingly.  She has a history of shoulder issues, which have improved following treatment. She has been performing exercises at home after an initial physical therapy appointment, as she has been unable to drive to further sessions. No current pain in the shoulder is reported.  She also experiences trigger finger in her left ring finger, which has been an intermittent issue. She received an injection on Sep 13, 2023, which initially resolved the symptoms, but they have since returned.         OBJECTIVE:       10/30/2023    9:33 AM  Depression screen PHQ 2/9  Decreased Interest 0  Down, Depressed, Hopeless 0  PHQ - 2 Score 0  Altered sleeping 0  Tired, decreased energy 0  Change in appetite 0  Feeling bad or failure about yourself  0  Trouble concentrating 0  Moving slowly or fidgety/restless 0  Suicidal thoughts 0  PHQ-9 Score 0  Difficult doing work/chores Not difficult at all     BP Readings from Last 3 Encounters:  01/01/24 124/82  12/19/23 (!) 158/68  11/22/23 (!) 151/47    BP  124/82   Pulse 61   Wt 162 lb 6.4 oz (73.7 kg)   SpO2 98%   BMI 30.69 kg/m    Physical Exam          Physical Exam Constitutional:      Appearance: Normal appearance.  Musculoskeletal:     Comments: Triggering of noted ring left finger   Neurological:     General: No focal deficit present.     Mental Status: She is alert. Mental status is at baseline. She is disoriented.     ASSESSMENT/PLAN:   Assessment & Plan Acute pain of right shoulder  Chronic pain of right knee  Trigger ring finger of left hand    Assessment and Plan    Left ring finger trigger finger Recurrent trigger finger in the left ring finger, previously treated with an injection. Symptoms have returned, indicating the need for a second injection within the six-month window to increase the likelihood of resolution. There is an 82% chance of resolution with two injections within six months. If symptoms persist beyond this period, surgical intervention may be necessary. - Schedule an appointment for a second injection for the left ring finger trigger finger within this week.  Knee osteoarthritis status post radiofrequency ablation Knee osteoarthritis with recent radiofrequency ablation. Ongoing pain and swelling expected to worsen before improving. A compression sleeve is recommended to manage swelling and associated pain, potentially decreasing pain by  about 25%. - Recommend wearing a compression sleeve to manage knee swelling and pain. - Refill tramadol  prescription for pain management post-ablation.  Status post shoulder procedure with ongoing rehabilitation Status post shoulder procedure with ongoing rehabilitation. She reports improvement and is performing exercises at home. The focus is on building strength to prevent re-injury. Physical therapy sessions are recommended to ensure proper technique and progression. - Continue shoulder exercises at home, focusing on strength building. - Attend physical  therapy sessions when possible to ensure proper technique and progression.         Martha Gilmore A. Vita MD [facility]

## 2024-01-07 ENCOUNTER — Encounter: Payer: Self-pay | Admitting: Family Medicine

## 2024-01-07 ENCOUNTER — Ambulatory Visit: Admitting: Family Medicine

## 2024-01-07 VITALS — BP 124/70 | HR 71 | Wt 164.2 lb

## 2024-01-07 DIAGNOSIS — M65342 Trigger finger, left ring finger: Secondary | ICD-10-CM | POA: Diagnosis not present

## 2024-01-07 NOTE — Progress Notes (Signed)
   Name: Martha Gilmore   Date of Visit: 01/07/24   Date of last visit with me: 01/01/2024   CHIEF COMPLAINT:  Chief Complaint  Patient presents with   Procedure    Trigger finger injection.         HPI:  Patient is here for follow-up trigger finger injection.  Patient had a trigger finger injection done on the right ring finger approximately March of this year.  Patient states that she started having triggering again over the past few weeks.  Discussed with patient the benefits of injecting now versus later as she will likely receive most of the benefit within a 45-month timeframe.  Patient is agreeable and she would like to go ahead and get a repeat trigger finger injection today   OBJECTIVE:   BP 124/70   Pulse 71   Wt 164 lb 3.2 oz (74.5 kg)   SpO2 97%   BMI 31.03 kg/m    Ortho Exam  Inspection reveals no significant abnormalities of the right hand.  There is a noted area of tenderness to palpation and thickness over the PIP of the right ring finger.  There is noted triggering with flexion of the finger.  No issues with extension of the digit.  ASSESSMENT/PLAN:   Assessment & Plan Trigger ring finger of left hand    1. Trigger ring finger of left hand (Primary) - Discussed with patient's the risks and benefits of procedure patient like to go ahead and do the trigger finger injection today.  Patient tolerated procedure well. - Did discuss with patient that given that this is her second injection within the last 6 months, there may not be benefit for third and at that time it may be better to discuss with hand surgeon regarding trigger finger release.  Patient is understanding and agreeable to this plan.  Patient otherwise has no other concerns at this time and will follow-up as needed.  PROCEDURE:  Risks & benefits of right ring trigger finger injection reviewed.  Consent obtained.  Time-out completed.  Patient prepped and draped in the normal fashion.  Area cleansed with  alcohol.  Ethyl chloride spray used to anesthetize the skin.  Solution of 0.5 mL 1% lidocaine  with 0.5 mL methylprednisolone  (Depo-Medrol ) 40mg /ml injected into right ring finger A2 pulley.  Patient tolerated procedure well without any complications.  Area covered with adhesive bandage.  Post-procedure care reviewed.  All questions answered.      Jernard Reiber A. Vita MD [facility]

## 2024-01-08 ENCOUNTER — Encounter: Payer: Self-pay | Admitting: Physical Therapy

## 2024-01-08 ENCOUNTER — Ambulatory Visit: Attending: Family Medicine | Admitting: Physical Therapy

## 2024-01-08 DIAGNOSIS — M25511 Pain in right shoulder: Secondary | ICD-10-CM | POA: Diagnosis present

## 2024-01-08 DIAGNOSIS — M6281 Muscle weakness (generalized): Secondary | ICD-10-CM | POA: Insufficient documentation

## 2024-01-08 NOTE — Therapy (Signed)
 OUTPATIENT PHYSICAL THERAPY SHOULDER EVALUATION   Patient Name: Martha Gilmore MRN: [id number] DOB:July 30, 1940, 83 y.o., female Today's Date: 01/08/2024   PT End of Session - 01/08/24 1017     Visit Number 2    Number of Visits --   1-2x/week   Date for PT Re-Evaluation 02/04/24    Authorization Type [employer] MCR    Progress Note Due on Visit 10    PT Start Time 1016    PT Stop Time 1057    PT Time Calculation (min) 41 min          Past Medical History:  Diagnosis Date   Allergy    Aortic insufficiency    mild to moderate by echo 12/2020   Arrhythmia    Arthritis    Atrial flutter (HCC) 12/30/2020   Cancer (HCC)    basal cell   Cataracts, bilateral 09/23/2015   Clostridium difficile infection 04/16/2017   H/o C.diff due to clindamicyin 09/2016; two rounds of treatment for cure. Dr. Rollin   Depression    Encounter for annual physical exam 05/09/2023   GERD (gastroesophageal reflux disease)    Heart murmur    Hyperlipidemia    OAB (overactive bladder) 12/16/2013   Osteopenia 08/05/2012   Dexa scan 07/2012 mild osteopenia, T = -1.1   Other fatigue 09/25/2022   Seasonal allergies    Sleep apnea    Thyroid  disease    Urge and stress incontinence    Past Surgical History:  Procedure Laterality Date   A-FLUTTER ABLATION N/A 10/14/2021   Procedure: A-FLUTTER ABLATION;  Surgeon: Inocencio Soyla Lunger, MD;  Location: [facility];  Service: Cardiovascular;  Laterality: N/A;   APPENDECTOMY  1971   CARDIOVERSION N/A 04/13/2021   Procedure: CARDIOVERSION;  Surgeon: Santo Stanly LABOR, MD;  Location: [facility];  Service: Cardiovascular;  Laterality: N/A;   CESAREAN SECTION     CHOLECYSTECTOMY     COSMETIC SURGERY     Mohs/end of nose   EYE SURGERY  cataracts   Teeth Implants     TRIGGER FINGER RELEASE     VAGINAL HYSTERECTOMY     Patient Active Problem List   Diagnosis Date Noted   Acute pain of right shoulder 11/12/2023   Fall 05/29/2023   Encounter for  annual physical exam 05/09/2023   Prediabetes 05/09/2023   Acquired hypothyroidism 05/09/2023   Vitamin D  deficiency 05/09/2023   Paresthesia of hand, bilateral 10/22/2022   Balance problems 10/22/2022   Dyspnea on exertion 10/22/2022   Arthritis 10/22/2022   Chronic neck pain 12/14/2021   Arthropathy of cervical facet joint 12/14/2021   Spondylosis, cervical 12/14/2021   Hypertension 07/04/2021   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) 05/02/2021   Long-term use of high-risk medication 04/07/2021   Nonrheumatic aortic valve insufficiency 04/07/2021   Obstructive sleep apnea 03/28/2021   Aortic insufficiency 01/18/2021   Atrial flutter (HCC) 12/30/2020   Chondromalacia, knee, right 11/05/2017   Tricompartment osteoarthritis of right knee 11/05/2017   Major depression, recurrent, chronic (HCC) 04/16/2017   Mixed hyperlipidemia 04/16/2017   Clostridium difficile infection 04/16/2017   Family history of early CAD 04/16/2017   Bilateral sensorineural hearing loss 02/06/2016   Postablative hypothyroidism 12/20/2015   History of Graves' disease 03/22/2015   OAB (overactive bladder) 12/16/2013   GERD (gastroesophageal reflux disease) 12/13/2012   Osteoarthritis of CMC joint of thumb 08/13/2012   Seasonal allergies 08/13/2012   Urge and stress incontinence 08/13/2012   Osteopenia 08/05/2012   DDD (degenerative  disc disease), lumbosacral 10/22/2010    PCP: Oris Camie BRAVO, NP  REFERRING PROVIDER: Vita Morrow, MD  THERAPY DIAG:  Right shoulder pain, unspecified chronicity  Muscle weakness  REFERRING DIAG: Rotator cuff injury, initial encounter [S46.009A]   Rationale for Evaluation and Treatment:  Rehabilitation  SUBJECTIVE:  PERTINENT PAST HISTORY:  Hearing loss, DDD, depression, osteopenia, R knee replacement      PRECAUTIONS: None  WEIGHT BEARING RESTRICTIONS No  FALLS:  Has patient fallen in last 6 months? Yes, Number of falls:  1 tipped going through doorway  MOI/History of condition:  Onset date: ~ 3 months  SUBJECTIVE STATEMENT  01/08/2024:  Pt reports that her R shoulder has been doing well.  She has had difficulty with HEP d/t severe R knee.  EVAL: Pt is a 83 y.o. female who presents to clinic with chief complaint of R shoulder pain which started about 3 months ago.  She is having no pain following steroid injection.  MD would like her to strengthen.  She uses a SPC d/t R knee pain and thinks that using her R hand to carry things may have caused her shoulder pain.  Abd and general activity using the shoulder increased her pain before the injection  Pain:  Are you having pain? No, none currently  Occupation: NA  Assistive Device: SPC  Hand Dominance: L  Patient Goals/Specific Activities: get strengthening exercises for shoulder   OBJECTIVE:   DIAGNOSTIC FINDINGS:  Independent interpretation of previous x-ray done on 11/12/2023 shows no signs of acute trauma. No noted significant arthritic changes. Mild degenerative changes noted. Noted AC joint arthropathy.   IMPRESSION: 1. Mild acromioclavicular degenerative spurring. 2. Subchondral cystic change in the lateral humeral head, can be seen with rotator cuff pathology.  GENERAL OBSERVATION: Rounded shoulder posture     SENSATION: Light touch: Appears intact   PALPATION: No TTP on exam  UPPER EXTREMITY AROM:  ROM Right (Eval) Left (Eval)  Shoulder flexion 140 135  Shoulder abduction 135 135  Shoulder internal rotation    Shoulder external rotation    Functional IR n n  Functional ER n n  Shoulder extension    Elbow extension    Elbow flexion     (Blank rows = not tested, N = WNL, * = concordant pain with testing)  UPPER EXTREMITY MMT:  MMT Right (Eval) Left (Eval)  Shoulder flexion 4 4+  Shoulder abduction (C5)    Shoulder ER 4 4+  Shoulder IR 4 4+  Middle trapezius    Lower trapezius    Shoulder extension    Grip  strength    Cervical flexion (C1,C2)    Cervical S/B (C3)    Shoulder shrug (C4)    Elbow flexion (C6)    Elbow ext (C7)    Thumb ext (C8)    Finger abd (T1)    Grossly     (Blank rows = not tested, score listed is out of 5 possible points.  N = WNL, D = diminished, C = clear for gross weakness with myotome testing, * = concordant pain with testing)  UPPER EXTREMITY PROM:  PROM Right (Eval) Left (Eval)  Shoulder flexion    Shoulder abduction    Shoulder internal rotation    Shoulder external rotation    Functional IR    Functional ER    Shoulder extension    Elbow extension    Elbow flexion     (Blank rows = not tested, N = WNL, * =  concordant pain with testing)   PATIENT SURVEYS:  QuickDASH: QuickDASH Score: 27.3 / 100 = 27.3 %    TODAY'S TREATMENT:   [facility] Adult PT Treatment  01/08/2024:  Therapeutic Exercise: Sidelying (some neck pain, consider other position):  ER - 2# - 2x15 Flexion - 1# - 2x15 Chest press - 2# - 2x15 Supine shoulder flexion/ext with dowel - 2# - 3x8 - comfortable range Row - GTB - 3x10 Shoulder ext - GTB - 3x10 Towel slide on wall - 1' W/ lift off 1' Lateral slides Bil ER with scap squeeze - 2x10 - RTB    HOME EXERCISE PROGRAM: Access Code: [facility] URL: [email or web address] Date: 01/08/2024 Prepared by: Martha Gilmore  Exercises - Shoulder External Rotation and Scapular Retraction with Resistance  - 1 x daily - 7 x weekly - 3 sets - 10 reps - Standing Row with Anchored Resistance  - 1 x daily - 7 x weekly - 3 sets - 10 reps - Shoulder Extension with Resistance  - 1 x daily - 7 x weekly - 3 sets - 10 reps  Treatment priorities   Eval        Progressive cuff and periscapular strengthening        Robust HEP                                  ASSESSMENT:  CLINICAL IMPRESSION:  01/08/2024: Pt returns after extended break following knee ablation.  Continues to have minimal pain.  Starting progressive cuff and  periscapular strengthening program with no pain reported but significant fatigue, particularly with targeted r/c exercises.  EVAL:  Martha Gilmore is a 83 y.o. female who presents to clinic with history R shoulder pain ongoing for about 3 months to no specific inciting event or trauma.   No gross abnormalities on x-ray.  Possibly R/C related pain which was reduced with injection.  Will progress to well rounded HEP over a few visits.   Martha Gilmore will benefit from skilled PT to address relevant deficits and improve shoulder function.   OBJECTIVE IMPAIRMENTS: Pain, R shoulder strength, R shoulder ROM  ACTIVITY LIMITATIONS: lifting, reaching, housework, selfcare, using cane  PERSONAL FACTORS: See medical history and pertinent history   REHAB POTENTIAL: Good  CLINICAL DECISION MAKING: Evolving/moderate complexity  EVALUATION COMPLEXITY: Moderate   GOALS:   SHORT TERM GOALS: Target date: 01/07/2024   Martha Gilmore will be >75% HEP compliant to improve carryover between sessions and facilitate independent management of condition  Evaluation: ongoing Goal status: Ongoing   LONG TERM GOALS: Target date: 02/04/2024   Martha Gilmore will self report no pain with progressive strengthening exercises  Evaluation/Baseline:  Goal status: INITIAL   2.  Chaslyn will show a >/= 10 pct improvement in her QUICK DASH score (MCID is 10% or ~5 pts) as a proxy for functional improvement   Evaluation/Baseline: QuickDASH Score: 27.3 / 100 = 27.3 % Goal status: INITIAL   3.  Martha Gilmore will be able to regularly participate in resistance training including pushing, pulling, and OH pressing not limited by pain or weakness  Evaluation/Baseline: no regular resistance training Goal status: INITIAL   4.  Martha Gilmore will improve the following MMTs to >/= 4+/5 to show improvement in strength:  R shoulder ER and flexion   Evaluation/Baseline: see chart in note Goal status: INITIAL     PLAN: PT FREQUENCY:  1-2x/week  PT DURATION: 8 weeks  PLANNED INTERVENTIONS:  02835- PT Re-evaluation,  97110-Therapeutic exercises, 97530- Therapeutic activity, [id number]- Neuromuscular re-education, [id number]- Self Care, 02859- Manual therapy, [id number]- Gait training, [id number]- Aquatic Therapy, [phone]- Electrical stimulation (manual), [id number]- Vasopneumatic device, [id number]- Traction (mechanical), [id number]- Ionotophoresis 4mg /ml Dexamethasone , Taping, Dry Needling, Joint manipulation, and Spinal manipulation.   Martha Gilmore PT, DPT 01/08/2024, 10:59 AM  I just finished a [employer] eval/recert.  Name: Martha Gilmore  MRN: [id number] Please request 1x/week for 8 weeks.  Check all conditions that are expected to impact treatment: Musculoskeletal disorders   I DID put a charge in.  Check all possible CPT codes: 02889- Therapeutic Exercise, [phone]- Neuro Re-education, [phone] - Gait Training, [phone] - Manual Therapy, 97530 - Therapeutic Activities, 97535 - Self Care, [phone] - Re-evaluation, [id number] - Mechanical traction, and [id number] - Aquatic therapy   Thank you!   Date of referral: 7/28 Referring provider: Vita Morrow, MD  Referring diagnosis? Rotator cuff injury, initial encounter [S46.009A]  Treatment diagnosis? (if different than referring diagnosis) Right shoulder pain, unspecified chronicity  Muscle weakness  What was this (referring dx) caused by? Unspecified  Nature of Condition: Initial Onset (within last 3 months)   Laterality: Rt  Current Functional Measure Score: DASH QuickDASH Score: 27.3 / 100 = 27.3 %  Objective measurements identify impairments when they are compared to normal values, the uninvolved extremity, and prior level of function.  [x]  Yes  []  No  Objective assessment of functional ability: Minimal functional limitations   Briefly describe symptoms: history of R shoulder pain which is reduced following injection but strength deficits remain on R  How did symptoms start: slow onset  Average pain  intensity:  Last 24 hours: 0  Past week: 0  How often does the pt experience symptoms? Occasionally  How much have the symptoms interfered with usual daily activities? A little bit  How has condition changed since care began at this facility? NA - initial visit  In general, how is the patients overall health? Good   BACK PAIN (STarT Back Screening Tool) No

## 2024-01-09 ENCOUNTER — Encounter: Payer: Self-pay | Admitting: Student in an Organized Health Care Education/Training Program

## 2024-01-15 ENCOUNTER — Encounter: Payer: Self-pay | Admitting: Physical Therapy

## 2024-01-15 ENCOUNTER — Ambulatory Visit: Admitting: Physical Therapy

## 2024-01-15 DIAGNOSIS — M25511 Pain in right shoulder: Secondary | ICD-10-CM | POA: Diagnosis not present

## 2024-01-15 DIAGNOSIS — M6281 Muscle weakness (generalized): Secondary | ICD-10-CM

## 2024-01-15 NOTE — Therapy (Signed)
 OUTPATIENT PHYSICAL THERAPY SHOULDER EVALUATION   Patient Name: Martha Gilmore MRN: [id number] DOB:08/12/1940, 83 y.o., female Today's Date: 01/15/2024   PT End of Session - 01/15/24 1018     Visit Number 3    Number of Visits --   1-2x/week   Date for Recertification  02/04/24    Authorization Type UHC [facility]    Authorization Time Period Approved 12 visits 12/10/23-01/21/24    Progress Note Due on Visit 10    PT Start Time 1017    PT Stop Time 1057    PT Time Calculation (min) 40 min          Past Medical History:  Diagnosis Date   Allergy    Aortic insufficiency    mild to moderate by echo 12/2020   Arrhythmia    Arthritis    Atrial flutter (HCC) 12/30/2020   Cancer (HCC)    basal cell   Cataracts, bilateral 09/23/2015   Clostridium difficile infection 04/16/2017   H/o C.diff due to clindamicyin 09/2016; two rounds of treatment for cure. Dr. Rollin   Depression    Encounter for annual physical exam 05/09/2023   GERD (gastroesophageal reflux disease)    Heart murmur    Hyperlipidemia    OAB (overactive bladder) 12/16/2013   Osteopenia 08/05/2012   Dexa scan 07/2012 mild osteopenia, T = -1.1   Other fatigue 09/25/2022   Seasonal allergies    Sleep apnea    Thyroid  disease    Urge and stress incontinence    Past Surgical History:  Procedure Laterality Date   A-FLUTTER ABLATION N/A 10/14/2021   Procedure: A-FLUTTER ABLATION;  Surgeon: Inocencio Soyla Lunger, MD;  Location: [facility];  Service: Cardiovascular;  Laterality: N/A;   APPENDECTOMY  1971   CARDIOVERSION N/A 04/13/2021   Procedure: CARDIOVERSION;  Surgeon: Santo Stanly LABOR, MD;  Location: [facility];  Service: Cardiovascular;  Laterality: N/A;   CESAREAN SECTION     CHOLECYSTECTOMY     COSMETIC SURGERY     Mohs/end of nose   EYE SURGERY  cataracts   Teeth Implants     TRIGGER FINGER RELEASE     VAGINAL HYSTERECTOMY     Patient Active Problem List   Diagnosis Date Noted   Acute pain  of right shoulder 11/12/2023   Fall 05/29/2023   Encounter for annual physical exam 05/09/2023   Prediabetes 05/09/2023   Acquired hypothyroidism 05/09/2023   Vitamin D  deficiency 05/09/2023   Paresthesia of hand, bilateral 10/22/2022   Balance problems 10/22/2022   Dyspnea on exertion 10/22/2022   Arthritis 10/22/2022   Chronic neck pain 12/14/2021   Arthropathy of cervical facet joint 12/14/2021   Spondylosis, cervical 12/14/2021   Hypertension 07/04/2021   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) 05/02/2021   Long-term use of high-risk medication 04/07/2021   Nonrheumatic aortic valve insufficiency 04/07/2021   Obstructive sleep apnea 03/28/2021   Aortic insufficiency 01/18/2021   Atrial flutter (HCC) 12/30/2020   Chondromalacia, knee, right 11/05/2017   Tricompartment osteoarthritis of right knee 11/05/2017   Major depression, recurrent, chronic 04/16/2017   Mixed hyperlipidemia 04/16/2017   Clostridium difficile infection 04/16/2017   Family history of early CAD 04/16/2017   Bilateral sensorineural hearing loss 02/06/2016   Postablative hypothyroidism 12/20/2015   History of Graves' disease 03/22/2015   OAB (overactive bladder) 12/16/2013   GERD (gastroesophageal reflux disease) 12/13/2012   Osteoarthritis of CMC joint of thumb 08/13/2012   Seasonal allergies 08/13/2012   Urge and stress incontinence  08/13/2012   Osteopenia 08/05/2012   DDD (degenerative disc disease), lumbosacral 10/22/2010    PCP: Oris Camie BRAVO, NP  REFERRING PROVIDER: Vita Morrow, MD  THERAPY DIAG:  Right shoulder pain, unspecified chronicity  Muscle weakness  REFERRING DIAG: Rotator cuff injury, initial encounter [S46.009A]   Rationale for Evaluation and Treatment:  Rehabilitation  SUBJECTIVE:  PERTINENT PAST HISTORY:  Hearing loss, DDD, depression, osteopenia, R knee replacement      PRECAUTIONS: None  WEIGHT BEARING RESTRICTIONS No  FALLS:   Has patient fallen in last 6 months? Yes, Number of falls: 1 tipped going through doorway  MOI/History of condition:  Onset date: ~ 3 months  SUBJECTIVE STATEMENT  01/15/2024:  Pt reports that she has been completing her HEP with no issue other than fatigue.  EVAL: Pt is a 83 y.o. female who presents to clinic with chief complaint of R shoulder pain which started about 3 months ago.  She is having no pain following steroid injection.  MD would like her to strengthen.  She uses a SPC d/t R knee pain and thinks that using her R hand to carry things may have caused her shoulder pain.  Abd and general activity using the shoulder increased her pain before the injection  Pain:  Are you having pain? No, none currently  Occupation: NA  Assistive Device: SPC  Hand Dominance: L  Patient Goals/Specific Activities: get strengthening exercises for shoulder   OBJECTIVE:   DIAGNOSTIC FINDINGS:  Independent interpretation of previous x-ray done on 11/12/2023 shows no signs of acute trauma. No noted significant arthritic changes. Mild degenerative changes noted. Noted AC joint arthropathy.   IMPRESSION: 1. Mild acromioclavicular degenerative spurring. 2. Subchondral cystic change in the lateral humeral head, can be seen with rotator cuff pathology.  GENERAL OBSERVATION: Rounded shoulder posture     SENSATION: Light touch: Appears intact   PALPATION: No TTP on exam  UPPER EXTREMITY AROM:  ROM Right (Eval) Left (Eval)  Shoulder flexion 140 135  Shoulder abduction 135 135  Shoulder internal rotation    Shoulder external rotation    Functional IR n n  Functional ER n n  Shoulder extension    Elbow extension    Elbow flexion     (Blank rows = not tested, N = WNL, * = concordant pain with testing)  UPPER EXTREMITY MMT:  MMT Right (Eval) Left (Eval)  Shoulder flexion 4 4+  Shoulder abduction (C5)    Shoulder ER 4 4+  Shoulder IR 4 4+  Middle trapezius    Lower trapezius     Shoulder extension    Grip strength    Cervical flexion (C1,C2)    Cervical S/B (C3)    Shoulder shrug (C4)    Elbow flexion (C6)    Elbow ext (C7)    Thumb ext (C8)    Finger abd (T1)    Grossly     (Blank rows = not tested, score listed is out of 5 possible points.  N = WNL, D = diminished, C = clear for gross weakness with myotome testing, * = concordant pain with testing)  UPPER EXTREMITY PROM:  PROM Right (Eval) Left (Eval)  Shoulder flexion    Shoulder abduction    Shoulder internal rotation    Shoulder external rotation    Functional IR    Functional ER    Shoulder extension    Elbow extension    Elbow flexion     (Blank rows = not tested, N =  WNL, * = concordant pain with testing)   PATIENT SURVEYS:  QuickDASH: QuickDASH Score: 27.3 / 100 = 27.3 %    TODAY'S TREATMENT:   [facility] Adult PT Treatment  01/08/2024:  Therapeutic Exercise: UBE - 2.5/2.5 - L 3 Chest press - 3# - 2x10 Supine shoulder flexion/ext with dowel - 3# - 3x10 - comfortable range Row - 13# - 2x10 Shoulder ext - 10# - 2x10 ER/IR - GTB - 2x15 ea  Therapeutic Activity  90-90 OH press - 3x10 - 3# Scaption - 1# - 2x10    HOME EXERCISE PROGRAM: Access Code: [facility] URL: [email or web address] Date: 01/15/2024 Prepared by: Helene Gasmen  Exercises - Shoulder External Rotation and Scapular Retraction with Resistance  - 1 x daily - 7 x weekly - 3 sets - 10 reps - Standing Row with Anchored Resistance  - 1 x daily - 7 x weekly - 3 sets - 10 reps - Shoulder Extension with Resistance  - 1 x daily - 7 x weekly - 3 sets - 10 reps - Shoulder Overhead Press in Flexion with Dumbbells  - 1 x daily - 7 x weekly - 3 sets - 10 reps - Standing Shoulder Scaption  - 1 x daily - 7 x weekly - 3 sets - 10 reps  Treatment priorities   Eval        Progressive cuff and periscapular strengthening        Robust HEP                                  ASSESSMENT:  CLINICAL  IMPRESSION:  01/15/2024: Pt doing well, working on progressive strengthening to good effect.  No pain but significant fatigue reported.  HEP updated  EVAL:  Kalianna is a 83 y.o. female who presents to clinic with history R shoulder pain ongoing for about 3 months to no specific inciting event or trauma.   No gross abnormalities on x-ray.  Possibly R/C related pain which was reduced with injection.  Will progress to well rounded HEP over a few visits.   Reizy will benefit from skilled PT to address relevant deficits and improve shoulder function.   OBJECTIVE IMPAIRMENTS: Pain, R shoulder strength, R shoulder ROM  ACTIVITY LIMITATIONS: lifting, reaching, housework, selfcare, using cane  PERSONAL FACTORS: See medical history and pertinent history   REHAB POTENTIAL: Good  CLINICAL DECISION MAKING: Evolving/moderate complexity  EVALUATION COMPLEXITY: Moderate   GOALS:   SHORT TERM GOALS: Target date: 01/07/2024   Marketia will be >75% HEP compliant to improve carryover between sessions and facilitate independent management of condition  Evaluation: ongoing Goal status: Ongoing   LONG TERM GOALS: Target date: 02/04/2024   Karime will self report no pain with progressive strengthening exercises  Evaluation/Baseline:  Goal status: INITIAL   2.  Jaxsyn will show a >/= 10 pct improvement in her QUICK DASH score (MCID is 10% or ~5 pts) as a proxy for functional improvement   Evaluation/Baseline: QuickDASH Score: 27.3 / 100 = 27.3 % Goal status: INITIAL   3.  Skylynn will be able to regularly participate in resistance training including pushing, pulling, and OH pressing not limited by pain or weakness  Evaluation/Baseline: no regular resistance training Goal status: INITIAL   4.  Briseidy will improve the following MMTs to >/= 4+/5 to show improvement in strength:  R shoulder ER and flexion   Evaluation/Baseline: see chart in note Goal status:  INITIAL  PLAN: PT FREQUENCY: 1-2x/week  PT DURATION: 8 weeks  PLANNED INTERVENTIONS:  97164- PT Re-evaluation, 97110-Therapeutic exercises, 97530- Therapeutic activity, [id number]- Neuromuscular re-education, 97535- Self Care, 02859- Manual therapy, [id number]- Gait training, [id number]- Aquatic Therapy, [phone]- Electrical stimulation (manual), [id number]- Vasopneumatic device, [id number]- Traction (mechanical), [id number]- Ionotophoresis 4mg /ml Dexamethasone , Taping, Dry Needling, Joint manipulation, and Spinal manipulation.   Helene Gasmen PT, DPT 01/15/2024, 12:30 PM  I just finished a [employer] eval/recert.  Name: Yarelis [name]  MRN: [id number] Please request 1x/week for 8 weeks.  Check all conditions that are expected to impact treatment: Musculoskeletal disorders   I DID put a charge in.  Check all possible CPT codes: 02889- Therapeutic Exercise, [phone]- Neuro Re-education, [phone] - Gait Training, [phone] - Manual Therapy, 97530 - Therapeutic Activities, 97535 - Self Care, [phone] - Re-evaluation, [id number] - Mechanical traction, and [id number] - Aquatic therapy   Thank you!   Date of referral: 7/28 Referring provider: Vita Morrow, MD  Referring diagnosis? Rotator cuff injury, initial encounter [S46.009A]  Treatment diagnosis? (if different than referring diagnosis) Right shoulder pain, unspecified chronicity  Muscle weakness  What was this (referring dx) caused by? Unspecified  Nature of Condition: Initial Onset (within last 3 months)   Laterality: Rt  Current Functional Measure Score: DASH QuickDASH Score: 27.3 / 100 = 27.3 %  Objective measurements identify impairments when they are compared to normal values, the uninvolved extremity, and prior level of function.  [x]  Yes  []  No  Objective assessment of functional ability: Minimal functional limitations   Briefly describe symptoms: history of R shoulder pain which is reduced following injection but strength deficits remain on R  How did symptoms  start: slow onset  Average pain intensity:  Last 24 hours: 0  Past week: 0  How often does the pt experience symptoms? Occasionally  How much have the symptoms interfered with usual daily activities? A little bit  How has condition changed since care began at this facility? NA - initial visit  In general, how is the patients overall health? Good   BACK PAIN (STarT Back Screening Tool) No

## 2024-01-17 ENCOUNTER — Encounter: Payer: Self-pay | Admitting: Nurse Practitioner

## 2024-01-17 DIAGNOSIS — G8929 Other chronic pain: Secondary | ICD-10-CM

## 2024-01-18 MED ORDER — TRAMADOL HCL 50 MG PO TABS: 50.0000 mg | ORAL_TABLET | Freq: Three times a day (TID) | ORAL | 0 refills | Status: DC | PRN

## 2024-01-22 ENCOUNTER — Ambulatory Visit: Admitting: Physical Therapy

## 2024-01-22 ENCOUNTER — Encounter: Payer: Self-pay | Admitting: Physical Therapy

## 2024-01-22 DIAGNOSIS — M25511 Pain in right shoulder: Secondary | ICD-10-CM

## 2024-01-22 DIAGNOSIS — M6281 Muscle weakness (generalized): Secondary | ICD-10-CM

## 2024-01-22 NOTE — Therapy (Signed)
 PHYSICAL THERAPY DISCHARGE SUMMARY  Visits from Start of Care: 4  Current functional level related to goals / functional outcomes: See assessment/goals   Remaining deficits: See assessment/goals   Education / Equipment: HEP and D/C plans  Patient agrees to discharge. Patient goals were met. Patient is being discharged due to being pleased with the current functional level.   Patient Name: Martha Gilmore MRN: [id number] DOB:09/12/1940, 83 y.o., female Today's Date: 01/22/2024   PT End of Session - 01/22/24 1504     Visit Number 4    Number of Visits --   1-2x/week   Date for Recertification  02/04/24    Authorization Type [employer] [facility]    Authorization Time Period Approved 12 visits 12/10/23-01/21/24    Progress Note Due on Visit 10    PT Start Time 1502    PT Stop Time 1532    PT Time Calculation (min) 30 min          Past Medical History:  Diagnosis Date   Allergy    Aortic insufficiency    mild to moderate by echo 12/2020   Arrhythmia    Arthritis    Atrial flutter (HCC) 12/30/2020   Cancer (HCC)    basal cell   Cataracts, bilateral 09/23/2015   Clostridium difficile infection 04/16/2017   H/o C.diff due to clindamicyin 09/2016; two rounds of treatment for cure. Dr. Rollin   Depression    Encounter for annual physical exam 05/09/2023   GERD (gastroesophageal reflux disease)    Heart murmur    Hyperlipidemia    OAB (overactive bladder) 12/16/2013   Osteopenia 08/05/2012   Dexa scan 07/2012 mild osteopenia, T = -1.1   Other fatigue 09/25/2022   Seasonal allergies    Sleep apnea    Thyroid  disease    Urge and stress incontinence    Past Surgical History:  Procedure Laterality Date   A-FLUTTER ABLATION N/A 10/14/2021   Procedure: A-FLUTTER ABLATION;  Surgeon: Inocencio Soyla Lunger, MD;  Location: [facility];  Service: Cardiovascular;  Laterality: N/A;   APPENDECTOMY  1971   CARDIOVERSION N/A 04/13/2021   Procedure: CARDIOVERSION;  Surgeon:  Santo Stanly LABOR, MD;  Location: [facility] ENDOSCOPY;  Service: Cardiovascular;  Laterality: N/A;   CESAREAN SECTION     CHOLECYSTECTOMY     COSMETIC SURGERY     Mohs/end of nose   EYE SURGERY  cataracts   Teeth Implants     TRIGGER FINGER RELEASE     VAGINAL HYSTERECTOMY     Patient Active Problem List   Diagnosis Date Noted   Acute pain of right shoulder 11/12/2023   Fall 05/29/2023   Encounter for annual physical exam 05/09/2023   Prediabetes 05/09/2023   Acquired hypothyroidism 05/09/2023   Vitamin D  deficiency 05/09/2023   Paresthesia of hand, bilateral 10/22/2022   Balance problems 10/22/2022   Dyspnea on exertion 10/22/2022   Arthritis 10/22/2022   Chronic neck pain 12/14/2021   Arthropathy of cervical facet joint 12/14/2021   Spondylosis, cervical 12/14/2021   Hypertension 07/04/2021   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) 05/02/2021   Long-term use of high-risk medication 04/07/2021   Nonrheumatic aortic valve insufficiency 04/07/2021   Obstructive sleep apnea 03/28/2021   Aortic insufficiency 01/18/2021   Atrial flutter (HCC) 12/30/2020   Chondromalacia, knee, right 11/05/2017   Tricompartment osteoarthritis of right knee 11/05/2017   Major depression, recurrent, chronic 04/16/2017   Mixed hyperlipidemia 04/16/2017   Clostridium difficile infection 04/16/2017   Family history of  early CAD 04/16/2017   Bilateral sensorineural hearing loss 02/06/2016   Postablative hypothyroidism 12/20/2015   History of Graves' disease 03/22/2015   OAB (overactive bladder) 12/16/2013   GERD (gastroesophageal reflux disease) 12/13/2012   Osteoarthritis of CMC joint of thumb 08/13/2012   Seasonal allergies 08/13/2012   Urge and stress incontinence 08/13/2012   Osteopenia 08/05/2012   DDD (degenerative disc disease), lumbosacral 10/22/2010    PCP: Oris Camie BRAVO, NP  REFERRING PROVIDER: Vita Morrow, MD  THERAPY DIAG:  Right shoulder  pain, unspecified chronicity  Muscle weakness  REFERRING DIAG: Rotator cuff injury, initial encounter [S46.009A]   Rationale for Evaluation and Treatment:  Rehabilitation  SUBJECTIVE:  PERTINENT PAST HISTORY:  Hearing loss, DDD, depression, osteopenia, R knee replacement      PRECAUTIONS: None  WEIGHT BEARING RESTRICTIONS No  FALLS:  Has patient fallen in last 6 months? Yes, Number of falls: 1 tipped going through doorway  MOI/History of condition:  Onset date: ~ 3 months  SUBJECTIVE STATEMENT  01/22/2024:  R shoulder has been doing well.  Standing exercises were agging to back last session.  Feels ready to d/c.  EVAL: Pt is a 83 y.o. female who presents to clinic with chief complaint of R shoulder pain which started about 3 months ago.  She is having no pain following steroid injection.  MD would like her to strengthen.  She uses a SPC d/t R knee pain and thinks that using her R hand to carry things may have caused her shoulder pain.  Abd and general activity using the shoulder increased her pain before the injection  Pain:  Are you having pain? No, none currently  Occupation: NA  Assistive Device: SPC  Hand Dominance: L  Patient Goals/Specific Activities: get strengthening exercises for shoulder   OBJECTIVE:   DIAGNOSTIC FINDINGS:  Independent interpretation of previous x-ray done on 11/12/2023 shows no signs of acute trauma. No noted significant arthritic changes. Mild degenerative changes noted. Noted AC joint arthropathy.   IMPRESSION: 1. Mild acromioclavicular degenerative spurring. 2. Subchondral cystic change in the lateral humeral head, can be seen with rotator cuff pathology.  GENERAL OBSERVATION: Rounded shoulder posture     SENSATION: Light touch: Appears intact   PALPATION: No TTP on exam  UPPER EXTREMITY AROM:  ROM Right (Eval) Left (Eval)  Shoulder flexion 140 135  Shoulder abduction 135 135  Shoulder internal rotation    Shoulder  external rotation    Functional IR n n  Functional ER n n  Shoulder extension    Elbow extension    Elbow flexion     (Blank rows = not tested, N = WNL, * = concordant pain with testing)  UPPER EXTREMITY MMT:  MMT Right (Eval) Left (Eval) R 9/24 R 9/30  Shoulder flexion 4 4+ 4 4+  Shoulder abduction (C5)      Shoulder ER 4 4+ 4 4+  Shoulder IR 4 4+ 4+ 5  Middle trapezius      Lower trapezius      Shoulder extension      Grip strength      Cervical flexion (C1,C2)      Cervical S/B (C3)      Shoulder shrug (C4)      Elbow flexion (C6)      Elbow ext (C7)      Thumb ext (C8)      Finger abd (T1)      Grossly       (Blank rows = not tested,  score listed is out of 5 possible points.  N = WNL, D = diminished, C = clear for gross weakness with myotome testing, * = concordant pain with testing)  UPPER EXTREMITY PROM:  PROM Right (Eval) Left (Eval)  Shoulder flexion    Shoulder abduction    Shoulder internal rotation    Shoulder external rotation    Functional IR    Functional ER    Shoulder extension    Elbow extension    Elbow flexion     (Blank rows = not tested, N = WNL, * = concordant pain with testing)   PATIENT SURVEYS:  QuickDASH: QuickDASH Score: 27.3 / 100 = 27.3 %    TODAY'S TREATMENT:   [facility] Adult PT Treatment  01/22/2024:  Therapeutic Exercise: Seated bil ER with blue TB - 3x10 Seated row Seated shoulder ext   Therapeutic Activity  90-90 OH press - 3x10 - 3# Scaption - 2# - 3x10 Checking goals and reviewing progress with pt    HOME EXERCISE PROGRAM: Access Code: [facility] URL: [email or web address] Date: 01/15/2024 Prepared by: Helene Gasmen  Exercises - Shoulder External Rotation and Scapular Retraction with Resistance  - 1 x daily - 7 x weekly - 3 sets - 10 reps - Standing Row with Anchored Resistance  - 1 x daily - 7 x weekly - 3 sets - 10 reps - Shoulder Extension with Resistance  - 1 x daily - 7 x weekly - 3  sets - 10 reps - Shoulder Overhead Press in Flexion with Dumbbells  - 1 x daily - 7 x weekly - 3 sets - 10 reps - Standing Shoulder Scaption  - 1 x daily - 7 x weekly - 3 sets - 10 reps  Treatment priorities   Eval        Progressive cuff and periscapular strengthening        Robust HEP                                  ASSESSMENT:  CLINICAL IMPRESSION:  01/22/2024: Pt has progressed well with therapy and met all short and long term goals. Encouraged her to continue strengthening for the next several months and general time frame for pain reduction following injection.  EVAL:  Elizabelle is a 83 y.o. female who presents to clinic with history R shoulder pain ongoing for about 3 months to no specific inciting event or trauma.   No gross abnormalities on x-ray.  Possibly R/C related pain which was reduced with injection.  Will progress to well rounded HEP over a few visits.   Fenna will benefit from skilled PT to address relevant deficits and improve shoulder function.   OBJECTIVE IMPAIRMENTS: Pain, R shoulder strength, R shoulder ROM  ACTIVITY LIMITATIONS: lifting, reaching, housework, selfcare, using cane  PERSONAL FACTORS: See medical history and pertinent history   REHAB POTENTIAL: Good  CLINICAL DECISION MAKING: Evolving/moderate complexity  EVALUATION COMPLEXITY: Moderate   GOALS:   SHORT TERM GOALS: Target date: 01/07/2024   Keslie will be >75% HEP compliant to improve carryover between sessions and facilitate independent management of condition  Evaluation: ongoing Goal status: MET   LONG TERM GOALS: Target date: 02/04/2024   Carron will self report no pain with progressive strengthening exercises  Evaluation/Baseline:  9/24: minimal pain 9/30: 0/10 Goal status: MET   2.  Shaleena will show a >/= 10 pct improvement in her QUICK DASH score (MCID is  10% or ~5 pts) as a proxy for functional improvement   Evaluation/Baseline: QuickDASH Score: 27.3 /  100 = 27.3 % 9/30: QuickDASH Score: 13.6 / 100 = 13.6 % Goal status: MET   3.  Elanor will be able to regularly participate in resistance training including pushing, pulling, and OH pressing not limited by pain or weakness  Evaluation/Baseline: no regular resistance training 9/24: started intro strengthening with low load, has not attempted gym yet 9/30: doing well with strengthening; no pain Goal status: MET   4.  Shavawn will improve the following MMTs to >/= 4+/5 to show improvement in strength:  R shoulder ER and flexion   Evaluation/Baseline: see chart in note 9/24: ongoing 9/30: MET Goal status: MET     PLAN: PT FREQUENCY: 1-2x/week  PT DURATION: 8 weeks  PLANNED INTERVENTIONS:  [id number]- PT Re-evaluation, 97110-Therapeutic exercises, 97530- Therapeutic activity, 97112- Neuromuscular re-education, 97535- Self Care, 02859- Manual therapy, [id number]- Gait training, [id number]- Aquatic Therapy, [phone]- Electrical stimulation (manual), [id number]- Vasopneumatic device, [id number]- Traction (mechanical), [id number]- Ionotophoresis 4mg /ml Dexamethasone , Taping, Dry Needling, Joint manipulation, and Spinal manipulation.   Helene Gasmen PT, DPT 01/22/2024, 3:39 PM  I just finished a [employer] eval/recert.  Name: Margy [name]  MRN: [id number] Please request 1x/week for 8 weeks.  Check all conditions that are expected to impact treatment: Musculoskeletal disorders   I DID put a charge in.  Check all possible CPT codes: 02889- Therapeutic Exercise, [phone]- Neuro Re-education, [phone] - Gait Training, [phone] - Manual Therapy, 97530 - Therapeutic Activities, 97535 - Self Care, [phone] - Re-evaluation, [id number] - Mechanical traction, and [id number] - Aquatic therapy   Thank you!   Date of referral: 7/28 Referring provider: Vita Morrow, MD  Referring diagnosis? Rotator cuff injury, initial encounter [S46.009A]  Treatment diagnosis? (if different than referring diagnosis) Right shoulder pain, unspecified  chronicity  Muscle weakness  What was this (referring dx) caused by? Unspecified  Nature of Condition: Initial Onset (within last 3 months)   Laterality: Rt  Current Functional Measure Score: DASH QuickDASH Score: 27.3 / 100 = 27.3 %  Objective measurements identify impairments when they are compared to normal values, the uninvolved extremity, and prior level of function.  [x]  Yes  []  No  Objective assessment of functional ability: Minimal functional limitations   Briefly describe symptoms: history of R shoulder pain which is reduced following injection but strength deficits remain on R  How did symptoms start: slow onset  Average pain intensity:  Last 24 hours: 0  Past week: 0  How often does the pt experience symptoms? Occasionally  How much have the symptoms interfered with usual daily activities? A little bit  How has condition changed since care began at this facility? NA - initial visit  In general, how is the patients overall health? Good   BACK PAIN (STarT Back Screening Tool) No

## 2024-01-23 ENCOUNTER — Encounter: Payer: Self-pay | Admitting: Student in an Organized Health Care Education/Training Program

## 2024-01-29 ENCOUNTER — Ambulatory Visit: Admitting: Physical Therapy

## 2024-02-05 ENCOUNTER — Ambulatory Visit: Admitting: Physical Therapy

## 2024-02-07 ENCOUNTER — Ambulatory Visit
Attending: Student in an Organized Health Care Education/Training Program | Admitting: Student in an Organized Health Care Education/Training Program

## 2024-02-07 ENCOUNTER — Encounter: Payer: Self-pay | Admitting: Student in an Organized Health Care Education/Training Program

## 2024-02-07 VITALS — BP 143/65 | HR 67 | Temp 97.1°F | Resp 18 | Ht 61.0 in | Wt 160.0 lb

## 2024-02-07 DIAGNOSIS — M25561 Pain in right knee: Secondary | ICD-10-CM | POA: Insufficient documentation

## 2024-02-07 DIAGNOSIS — M1711 Unilateral primary osteoarthritis, right knee: Secondary | ICD-10-CM | POA: Diagnosis present

## 2024-02-07 DIAGNOSIS — G8929 Other chronic pain: Secondary | ICD-10-CM | POA: Insufficient documentation

## 2024-02-07 NOTE — Progress Notes (Signed)
 PROVIDER NOTE: Interpretation of information contained herein should be left to medically-trained personnel. Specific patient instructions are provided elsewhere under Patient Instructions section of medical record. This document was created in part using AI and STT-dictation technology, any transcriptional errors that may result from this process are unintentional.  Patient: Martha Gilmore  Service: E/M   PCP: Oris Camie BRAVO, NP  DOB: Nov 28, 1940  DOS: 02/07/2024  Provider: Wallie Sherry, MD  MRN: [id number]  Delivery: Face-to-face  Specialty: Interventional Pain Management  Type: Established Patient  Setting: Ambulatory outpatient facility  Specialty designation: 09  Referring Prov.: Early, Camie BRAVO, NP  Location: Outpatient office facility       History of present illness (HPI) Martha Gilmore, a 83 y.o. year old female, is here today because of her Primary osteoarthritis of right knee [M17.11]. Ms. Juliana primary complain today is Knee Pain (Right Knee Pain)  Pain Assessment: Severity of Chronic pain is reported as a 8 /10. Location: Knee Right/Denies. Onset: More than a month ago. Quality: Aching, Stabbing. Timing: Constant. Modifying factor(s): Sitting down, Elevating the knee. Vitals:  height is 5' 1 (1.549 m) and weight is 160 lb (72.6 kg). Her temporal temperature is 97.1 F (36.2 C) (abnormal). Her blood pressure is 143/65 (abnormal) and her pulse is 67. Her respiration is 18 and oxygen saturation is 100%.  BMI: Estimated body mass index is 30.23 kg/m as calculated from the following:   Height as of this encounter: 5' 1 (1.549 m).   Weight as of this encounter: 160 lb (72.6 kg).  Last encounter: 11/22/2023. Last procedure: 12/19/2023.  Reason for encounter:   Post-Procedure Evaluation   Procedure:          Anesthesia, Analgesia, Anxiolysis:  Type: Therapeutic Superolateral, Superomedial, and Inferomedial, Genicular Nerve Radiofrequency Ablation (destruction).   #1  Region:  Lateral, Anterior, and Medial aspects of the knee joint, above and below the knee joint proper. Level: Superior and inferior to the knee joint. Laterality: Right  Anesthesia: Local (1-2% Lidocaine )  Sedation: None  Guidance: Fluoroscopy           Position: Supine   Indications: 1. Primary osteoarthritis of right knee   2. Chronic pain of right knee    [name] been dealing with the above chronic pain for longer than three months and has either failed to respond, was unable to tolerate, or simply did not get enough benefit from other more conservative therapies including, but not limited to: 1. Over-the-counter medications 2. Anti-inflammatory medications 3. Muscle relaxants 4. Membrane stabilizers 5. Opioids 6. Physical therapy and/or chiropractic manipulation 7. Modalities (Heat, ice, etc.) 8. Invasive techniques such as nerve blocks. [name] attained more than 50% relief of the pain from a series of diagnostic injections conducted in separate occasions.  Pain Score: Pre-procedure: 8 /10 Post-procedure: 0-No pain/10    Effectiveness:  Initial hour after procedure: 100 %  Subsequent 4-6 hours post-procedure: 100 %  Analgesia past initial 6 hours: 0 % . Ongoing improvement:  Analgesic:  0%    ROS  Constitutional: Denies any fever or chills Gastrointestinal: No reported hemesis, hematochezia, vomiting, or acute GI distress Musculoskeletal: Persistent right knee pain Neurological: No reported episodes of acute onset apraxia, aphasia, dysarthria, agnosia, amnesia, paralysis, loss of coordination, or loss of consciousness  Medication Review  Multiple Vitamins-Minerals, Polyethyl Glycol-Propyl Glycol, Vitamin D3, atorvastatin , citalopram , levothyroxine , omeprazole , oxybutynin , and traMADol   History Review  Allergy: [name] is allergic to aspirin, demerol  [meperidine hcl], nsaids,  penicillins, statins, cephalexin, clindamycin/lincomycin, diclofenac ,  gabapentin , and tape. Drug: [name]  reports no history of drug use. Alcohol:  reports no history of alcohol use. Tobacco:  reports that she has never smoked. She has been exposed to tobacco smoke. She has never used smokeless tobacco. Social: [name]  reports that she has never smoked. She has been exposed to tobacco smoke. She has never used smokeless tobacco. She reports that she does not drink alcohol and does not use drugs. Medical:  has a past medical history of Allergy, Aortic insufficiency, Arrhythmia, Arthritis, Atrial flutter (HCC) (12/30/2020), Cancer (HCC), Cataracts, bilateral (09/23/2015), Clostridium difficile infection (04/16/2017), Depression, Encounter for annual physical exam (05/09/2023), GERD (gastroesophageal reflux disease), Heart murmur, Hyperlipidemia, OAB (overactive bladder) (12/16/2013), Osteopenia (08/05/2012), Other fatigue (09/25/2022), Seasonal allergies, Sleep apnea, Thyroid  disease, and Urge and stress incontinence. Surgical: [name]  has a past surgical history that includes Cholecystectomy; Cesarean section; Teeth Implants; Appendectomy (1971); Trigger finger release; Vaginal hysterectomy; Cardioversion (N/A, 04/13/2021); A-FLUTTER ABLATION (N/A, 10/14/2021); Eye surgery (cataracts); and Cosmetic surgery. Family: family history includes Arthritis in her mother and sister; Brain cancer in her brother; Cancer in her daughter, father, sister, and sister; Diabetes in her sister; Early death in her sister; Heart disease in her father, maternal grandmother, mother, and sister; Hypertension in her mother and sister; Kidney cancer in her sister; Kidney disease in her sister; Lung cancer in her sister; Miscarriages / Stillbirths in her mother; Obesity in her father, mother, and sister.  Laboratory Chemistry Profile   Renal Lab Results  Component Value Date   BUN 14 05/08/2023   CREATININE 0.79 05/08/2023   BCR 18 05/08/2023   GFR 57.58 (L) 05/02/2021     Hepatic Lab Results  Component Value Date   AST 16 05/08/2023   ALT 11 05/08/2023   ALBUMIN 4.0 05/08/2023   ALKPHOS 143 (H) 05/08/2023    Electrolytes Lab Results  Component Value Date   NA 142 05/08/2023   K 4.5 05/08/2023   CL 103 05/08/2023   CALCIUM  9.7 05/08/2023   MG 2.1 04/04/2021    Bone Lab Results  Component Value Date   VD25OH 71.5 03/28/2023    Inflammation (CRP: Acute Phase) (ESR: Chronic Phase) Lab Results  Component Value Date   ESRSEDRATE 22 09/27/2020         Note: Above Lab results reviewed.  Recent Imaging Review  DG PAIN CLINIC C-ARM 1-60 MIN NO REPORT Fluoro was used, but no Radiologist interpretation will be provided.  Please refer to NOTES tab for provider progress note. Note: Reviewed        Physical Exam  Vitals: BP (!) 143/65 (BP Location: Right Arm, Patient Position: Sitting, Cuff Size: Normal)   Pulse 67   Temp (!) 97.1 F (36.2 C) (Temporal)   Resp 18   Ht 5' 1 (1.549 m)   Wt 160 lb (72.6 kg)   SpO2 100%   BMI 30.23 kg/m  BMI: Estimated body mass index is 30.23 kg/m as calculated from the following:   Height as of this encounter: 5' 1 (1.549 m).   Weight as of this encounter: 160 lb (72.6 kg). Ideal: Ideal body weight: 47.8 kg (105 lb 6.1 oz) Adjusted ideal body weight: 57.7 kg (127 lb 3.7 oz) General appearance: Well nourished, well developed, and well hydrated. In no apparent acute distress Mental status: Alert, oriented x 3 (person, place, & time)       Respiratory: No evidence of acute respiratory distress Eyes: PERLA  Persistent right knee pain, severe pain with weightbearing  Assessment   Diagnosis  1. Primary osteoarthritis of right knee   2. Chronic pain of right knee      Updated Problems: No problems updated.  Plan of Care  Unfortunately, no benefit with right genicular nerve RFA.  Patient has an upcoming appointment with orthopedics which I encouraged her to keep.   Right genicular nerve block:  10/24/2023, RFA 12/19/23    No follow-ups on file.    Recent Visits Date Type Provider Dept  12/19/23 Procedure visit Marcelino Nurse, MD [facility]  11/22/23 Office Visit Marcelino Nurse, MD [facility]  Showing recent visits within past 90 days and meeting all other requirements Today's Visits Date Type Provider Dept  02/07/24 Office Visit Marcelino Nurse, MD [facility]  Showing today's visits and meeting all other requirements Future Appointments No visits were found meeting these conditions. Showing future appointments within next 90 days and meeting all other requirements  I personally spent a total of 20 minutes in the care of the patient today including preparing to see the patient, getting/reviewing separately obtained history, performing a medically appropriate exam/evaluation, counseling and educating, placing orders, and documenting clinical information in the EHR.   Note by: Nurse Marcelino, MD (TTS and AI technology used. I apologize for any typographical errors that were not detected and corrected.) Date: 02/07/2024; Time: 10:50 AM

## 2024-02-07 NOTE — Progress Notes (Signed)
 Safety precautions to be maintained throughout the outpatient stay will include: orient to surroundings, keep bed in low position, maintain call bell within reach at all times, provide assistance with transfer out of bed and ambulation.

## 2024-02-08 ENCOUNTER — Ambulatory Visit: Payer: Self-pay

## 2024-02-08 ENCOUNTER — Ambulatory Visit: Admitting: Nurse Practitioner

## 2024-02-08 VITALS — BP 140/78 | HR 62 | Wt 161.8 lb

## 2024-02-08 DIAGNOSIS — M79661 Pain in right lower leg: Secondary | ICD-10-CM | POA: Diagnosis not present

## 2024-02-08 DIAGNOSIS — M25561 Pain in right knee: Secondary | ICD-10-CM

## 2024-02-08 DIAGNOSIS — I483 Typical atrial flutter: Secondary | ICD-10-CM | POA: Diagnosis not present

## 2024-02-08 DIAGNOSIS — M7989 Other specified soft tissue disorders: Secondary | ICD-10-CM

## 2024-02-08 DIAGNOSIS — G8929 Other chronic pain: Secondary | ICD-10-CM

## 2024-02-08 LAB — D-DIMER, QUANTITATIVE: D-DIMER: 1.01 mg{FEU}/L — ABNORMAL HIGH (ref 0.00–0.49)

## 2024-02-08 MED ORDER — TRAMADOL HCL 50 MG PO TABS: 50.0000 mg | ORAL_TABLET | Freq: Three times a day (TID) | ORAL | 0 refills | Status: DC | PRN

## 2024-02-08 MED ORDER — PREDNISONE 20 MG PO TABS: 20.0000 mg | ORAL_TABLET | Freq: Every day | ORAL | 0 refills | Status: DC

## 2024-02-08 NOTE — Telephone Encounter (Signed)
 FYI Only or Action Required?: FYI only for provider.  Patient was last seen in primary care on 01/07/2024 by Vita Morrow, MD.  Called Nurse Triage reporting Knee Pain.  Symptoms began several weeks ago.  Interventions attempted: Nothing.  Symptoms are: gradually worsening.  Triage Disposition: See HCP Within 4 Hours (Or PCP Triage)  Patient/caregiver understands and will follow disposition?: Yes  **Appt. Scheduled for 10/17**       Copied from CRM #[id number]. Topic: Clinical - Red Word Triage >> Feb 08, 2024  8:04 AM Martha Gilmore wrote: Red Word that prompted transfer to Nurse Triage: Pt called in stating that she is having severe pain in her right knee. Mentioned mentioned that its swollen and she also has arthritis in that knee as well. Warm transferred pt to nurse triage. Reason for Disposition  [1] Thigh or calf pain AND [2] only 1 side AND [3] present > 1 hour  Answer Assessment - Initial Assessment Questions 1. LOCATION and RADIATION: Where is the pain located?      Right knee pain  2. QUALITY: What does the pain feel like?  (e.g., sharp, dull, aching, burning)     Sharp  3. SEVERITY: How bad is the pain? What does it keep you from doing?   (Scale 1-10; or mild, moderate, severe)     Ambulating, 8/10  4. ONSET: When did the pain start? Does it come and go, or is it there all the time?     Ongoing, has arthritis, pain is worsening  5. RECURRENT: Have you had this pain before? If Yes, ask: When, and what happened then?     Yes  6. SETTING: Has there been any recent work, exercise or other activity that involved that part of the body?      No   7. AGGRAVATING FACTORS: What makes the knee pain worse? (e.g., walking, climbing stairs, running)     Ambulating   8. ASSOCIATED SYMPTOMS: Is there any swelling or redness of the knee?     Right knee swelling, no redness, warm to touch compared to other knee  9. OTHER SYMPTOMS: Do you have any other  symptoms? (e.g., calf pain, chest pain, difficulty breathing, fever)   Swelling in calf   Patient is not taking any medication for the knee pain currently, but has tramadol  Rx. Has upcomming appt. For surgery on the knee, but is concerned about the swelling. Appt. Scheduled for 10/17  Protocols used: Knee Pain-A-AH

## 2024-02-08 NOTE — Patient Instructions (Signed)
 Acute Knee Pain, Adult Many things can cause knee pain. Sometimes, knee pain is sudden (acute). It may be caused by damage, swelling, or irritation of the muscles and tissues that support your knee. Pain may come from: A fall. An injury to the knee from twisting motions. A hit to the knee. Infection. The pain often goes away on its own with time and rest. If the pain does not go away, tests may be done to find out what is causing the pain. These may include: Imaging tests, such as an X-ray, MRI, CT scan, or ultrasound. Joint aspiration. In this test, fluid is removed from the knee and checked. Arthroscopy. In this test, a lighted tube is put in the knee and an image is shown on a screen. A biopsy. In this test, a health care provider will remove a small piece of tissue for testing. Follow these instructions at home: If you have a knee sleeve or brace that can be taken off:  Wear the knee sleeve or brace as told by your provider. Take it off only if your provider says that you can. Check the skin around it every day. Tell your provider if you see problems. Loosen the knee sleeve or brace if your toes tingle, are numb, or turn cold and blue. Keep the knee sleeve or brace clean and dry. Bathing If the knee sleeve or brace is not waterproof: Do not let it get wet. Cover it when you take a bath or shower. Use a cover that does not let any water in. Managing pain, stiffness, and swelling  If told, put ice on the area. If you have a knee sleeve or brace that you can take off, remove it as told. Put ice in a plastic bag. Place a towel between your skin and the bag. Leave the ice on for 20 minutes, 2-3 times a day. If your skin turns bright red, take off the ice right away to prevent skin damage. The risk of damage is higher if you cannot feel pain, heat, or cold. Move your toes often to reduce stiffness and swelling. Raise the injured area above the level of your heart while you are sitting  or lying down. Use a pillow to support your foot as needed. If told, use an elastic bandage to put pressure (compression) on your injured knee. This may control swelling, give support, and help with discomfort. Sleep with a pillow under your knee. Activity Rest your knee. Do not do things that cause pain or make pain worse. Do not stand or walk on your injured knee until you're told it's okay. Use crutches as told. Avoid activities where both feet leave the ground at the same time and put stress on the joints. Avoid running, jumping rope, and doing jumping jacks. Work with a physical therapist to make a safe exercise program if told. Physical therapy helps your knee move better and get stronger. Exercise as told. General instructions Take your medicines only as told by your provider. If you are overweight, work with your provider and an expert in healthy eating, called a dietician, to set goals to lose weight. Being overweight can make your knee hurt more. Do not smoke, vape, or use products with nicotine or tobacco in them. If you need help quitting, talk with your provider. Return to normal activities when you are told. Ask what things are safe for you to do. Watch for any changes in your symptoms. Keep all follow-up visits. Your provider will check  your healing and adjust treatments if needed. Contact a health care provider if: The knee pain does not stop. The knee pain changes or gets worse. You have a fever along with knee pain. Your knee is red or feels warm when you touch it. Your knee gives out or locks up. Get help right away if: Your knee swells and the swelling gets worse. You cannot move your knee. You have very bad knee pain that does not get better with medicine. This information is not intended to replace advice given to you by your health care provider. Make sure you discuss any questions you have with your health care provider. Document Revised: 01/11/2023 Document  Reviewed: 06/05/2022 Elsevier Patient Education  2024 [employer].

## 2024-02-08 NOTE — Progress Notes (Signed)
 Camie FORBES Doing, DNP, AGNP-c [facility] [address] [place], [place] [address] [phone]   ACUTE VISIT on 02/08/2024  Blood pressure (!) 140/78, pulse 62, weight 161 lb 12.8 oz (73.4 kg).  Subjective:  HPI  History of Present Illness Martha Gilmore is an 83 year old female who presents with persistent right knee pain and swelling.  She has been experiencing persistent right knee pain and swelling that has not improved since undergoing a radiofrequency ablation on August 27th for management. The procedure did not alleviate her symptoms, and she continues to experience significant pain and swelling. The swelling has not subsided, and her veins appear prominent, which is concerning for her that there is another issue.  The pain is severe, particularly after activities such as grocery shopping or playing bridge, which require prolonged standing or sitting. She uses a cane for stability and has started using it indoors due to feeling unsteady. The knee sometimes feels hot, especially in the mornings, and she experiences sharp, ice-pick-like pain at night.  She has been taking tramadol  as needed, typically one tablet before activities to manage pain. She also uses a topical product called 'blue' for pain relief, though she finds it ineffective. Icing the knee provides some relief from swelling.  Her medical history includes a gel injection in December 2024, which was ineffective, and an ultrasound-guided injection in February 2025, also without relief. She has been avoiding surgery despite ongoing symptoms. She has a history of weight fluctuations, having lost weight from 183 pounds last year to around 170 pounds currently, and she maintains her weight through dietary adjustments, including using protein drinks as meal replacements.  She reports swelling, heat, and sharp pain. No recent twisting injury noted.  ROS negative except for what is listed in HPI. History,  Medications, Surgery, SDOH, and Family History reviewed and updated as appropriate.  Objective:  Physical Exam Vitals and nursing note reviewed.  Constitutional:      General: She is not in acute distress. Eyes:     Pupils: Pupils are equal, round, and reactive to light.  Cardiovascular:     Rate and Rhythm: Normal rate and regular rhythm.  Pulmonary:     Effort: Pulmonary effort is normal.  Musculoskeletal:        General: Swelling and tenderness present.     Right lower leg: Edema present.       Legs:     Comments: Swelling noted to the knee. No erythema or warmth present today. Swelling noted distally without pitting.   Skin:    General: Skin is warm and dry.     Capillary Refill: Capillary refill takes less than 2 seconds.     Findings: No erythema.  Neurological:     Mental Status: She is alert and oriented to person, place, and time.     Motor: Weakness present.     Coordination: Coordination abnormal.     Gait: Gait abnormal.  Psychiatric:        Mood and Affect: Mood normal.         Assessment & Plan:   Problem List Items Addressed This Visit     Atrial flutter (HCC)   Asymptomatic. Ablation historically. CPAP use for prevention of recurrence.       Other Visit Diagnoses       Acute pain of right knee    -  Primary   Relevant Medications   traMADol  (ULTRAM ) 50 MG tablet   predniSONE (DELTASONE) 20 MG tablet  Pain and swelling of lower leg, right       Relevant Orders   D-dimer, quantitative (Completed)     Chronic pain of right knee          Persistent right knee pain and swelling following ineffective radiofrequency ablation on August 27. Swelling extends down the leg with visible venous engorgement. Severe pain with episodes of sharp pain and increased swelling after prolonged sitting. Occasional heat sensation reported. Swelling may be exacerbated by reduced mobility and venous stasis. - Increase tramadol  to twice daily as needed for pain  management. - Apply ACE wrap for knee stability and to reduce swelling. - Use ice and elevate leg to manage swelling. - Consider use of a knee brace for additional support.   Possible meniscal tear or ligamentous injury suspected due to persistent pain and swelling. Examination suggests a potential small tear at the ligament insertion point, but no complete tear as there is no instability. Differential includes meniscal tear or bone spur irritation. - Refer to orthopedic specialist for further evaluation on November 4. - Consider short course of prednisone (one tablet daily for five days) to reduce inflammation and pain.  Rule out deep vein thrombosis (DVT) of right lower extremity Worsening swelling raises concern for possible DVT, although no redness or significant warmth is present. Limited mobility could contribute to venous stasis and potential clot formation. - Order D-dimer test to assess for DVT. - Monitor for any signs of DVT such as increased swelling, redness, or warmth.  Sustained weight loss Successfully maintained weight loss, currently around 163 pounds, down from a high of 183 pounds last year. Weight management achieved through dietary changes, including use of protein drinks as meal replacements. - Continue current dietary habits and use of protein drinks for weight management.   Camie FORBES Doing, DNP, AGNP-c Time: 36 minutes, >50% spent counseling, care coordination, chart review, and documentation.

## 2024-02-11 ENCOUNTER — Ambulatory Visit: Payer: Self-pay | Admitting: Nurse Practitioner

## 2024-02-11 ENCOUNTER — Ambulatory Visit (HOSPITAL_COMMUNITY)
Admission: RE | Admit: 2024-02-11 | Discharge: 2024-02-11 | Disposition: A | Discharge status: Home / Self Care | Source: Ambulatory Visit | Attending: Nurse Practitioner | Admitting: Nurse Practitioner

## 2024-02-11 DIAGNOSIS — R7989 Other specified abnormal findings of blood chemistry: Secondary | ICD-10-CM | POA: Insufficient documentation

## 2024-02-11 DIAGNOSIS — M7989 Other specified soft tissue disorders: Secondary | ICD-10-CM

## 2024-02-11 DIAGNOSIS — M79661 Pain in right lower leg: Secondary | ICD-10-CM | POA: Diagnosis present

## 2024-02-13 ENCOUNTER — Other Ambulatory Visit: Payer: Self-pay

## 2024-02-20 ENCOUNTER — Ambulatory Visit (INDEPENDENT_AMBULATORY_CARE_PROVIDER_SITE_OTHER): Admitting: Orthopaedic Surgery

## 2024-02-20 DIAGNOSIS — M25561 Pain in right knee: Secondary | ICD-10-CM | POA: Diagnosis not present

## 2024-02-20 NOTE — Assessment & Plan Note (Signed)
 Asymptomatic. Ablation historically. CPAP use for prevention of recurrence.

## 2024-02-20 NOTE — Progress Notes (Signed)
 Chief Complaint: Right knee pain     History of Present Illness:    Martha Gilmore is a 83 y.o. female presents with ongoing right medial based knee pain for the last several years.  She has previously been seen by Dr. Joane who had provided both a steroid and hyaluronic acid injection.  She did get good relief from the steroid injection which subsequently has worn off.  She has participated in physical therapy.  She did have a nerve ablation with Dr. Lazarus which did not give significant relief.  She was recommended for unilateral compartmental arthroplasty with Norleen Gavel    PMH/PSH/Family History/Social History/Meds/Allergies:    Past Medical History:  Diagnosis Date   Allergy    Aortic insufficiency    mild to moderate by echo 12/2020   Arrhythmia    Arthritis    Atrial flutter (HCC) 12/30/2020   Cancer (HCC)    basal cell   Cataracts, bilateral 09/23/2015   Clostridium difficile infection 04/16/2017   H/o C.diff due to clindamicyin 09/2016; two rounds of treatment for cure. Dr. Rollin   Depression    Encounter for annual physical exam 05/09/2023   GERD (gastroesophageal reflux disease)    Heart murmur    Hyperlipidemia    OAB (overactive bladder) 12/16/2013   Osteopenia 08/05/2012   Dexa scan 07/2012 mild osteopenia, T = -1.1   Other fatigue 09/25/2022   Seasonal allergies    Sleep apnea    Thyroid  disease    Urge and stress incontinence    Past Surgical History:  Procedure Laterality Date   A-FLUTTER ABLATION N/A 10/14/2021   Procedure: A-FLUTTER ABLATION;  Surgeon: Inocencio Soyla Lunger, MD;  Location: [facility];  Service: Cardiovascular;  Laterality: N/A;   APPENDECTOMY  1971   CARDIOVERSION N/A 04/13/2021   Procedure: CARDIOVERSION;  Surgeon: Santo Stanly LABOR, MD;  Location: [facility] ENDOSCOPY;  Service: Cardiovascular;  Laterality: N/A;   CESAREAN SECTION     CHOLECYSTECTOMY     COSMETIC SURGERY     Mohs/end of nose   EYE SURGERY  cataracts    Teeth Implants     TRIGGER FINGER RELEASE     VAGINAL HYSTERECTOMY     Social History   Socioeconomic History   Marital status: Widowed    Spouse name: Not on file   Number of children: Not on file   Years of education: Not on file   Highest education level: Some college, no degree  Occupational History   Not on file  Tobacco Use   Smoking status: Never    Passive exposure: Yes   Smokeless tobacco: Never   Tobacco comments:    grew up in house of smokers  Vaping Use   Vaping status: Never Used  Substance and Sexual Activity   Alcohol use: No   Drug use: No   Sexual activity: Not Currently    Partners: Male    Birth control/protection: None  Other Topics Concern   Not on file  Social History Narrative   Not on file   Social Drivers of Health   Financial Resource Strain: Low Risk  (02/08/2024)   Overall Financial Resource Strain (CARDIA)    Difficulty of Paying Living Expenses: Not hard at all  Food Insecurity: No Food Insecurity (02/08/2024)   Hunger Vital Sign    Worried About Running Out of Food in the Last Year: Never true    Ran Out of Food in the Last Year: Never true  Transportation  Needs: No Transportation Needs (02/08/2024)   PRAPARE - Administrator, Civil Service (Medical): No    Lack of Transportation (Non-Medical): No  Physical Activity: Inactive (02/08/2024)   Exercise Vital Sign    Days of Exercise per Week: 0 days    Minutes of Exercise per Session: Not on file  Stress: No Stress Concern Present (02/08/2024)   [employer] of Occupational Health - Occupational Stress Questionnaire    Feeling of Stress: Not at all  Social Connections: Moderately Integrated (02/08/2024)   Social Connection and Isolation Panel    Frequency of Communication with Friends and Family: More than three times a week    Frequency of Social Gatherings with Friends and Family: Twice a week    Attends Religious Services: More than 4 times per year    Active  Member of [employer] or Organizations: Yes    Attends Banker Meetings: Not on file    Marital Status: Widowed   Family History  Problem Relation Age of Onset   Heart disease Mother    Hypertension Mother    Arthritis Mother    Miscarriages / Stillbirths Mother    Obesity Mother    Cancer Father    Heart disease Father    Obesity Father    Lung cancer Sister    Heart disease Sister    Diabetes Sister    Cancer Sister    Early death Sister    Obesity Sister    Hypertension Sister    Kidney cancer Sister    Brain cancer Brother    Heart disease Maternal Grandmother    Cancer Daughter    Arthritis Sister    Cancer Sister    Kidney disease Sister    Allergies  Allergen Reactions   Aspirin Itching   Demerol  [Meperidine Hcl] Other (See Comments)    hallucinations   Nsaids Rash    Feeling flushed, rapid heartbeat.   Penicillins Other (See Comments) and Rash    Other Reaction: RASH & HIVES    Statins Itching   Cephalexin Other (See Comments) and Rash    Other Reaction: RASH & ITCHING    Clindamycin/Lincomycin Other (See Comments) and Rash    Other Reaction: RASH & ITCHING    Diclofenac  Other (See Comments)   Gabapentin  Other (See Comments)    Disorientation   Tape Rash    Paper tape is OK    Current Outpatient Medications  Medication Sig Dispense Refill   atorvastatin  (LIPITOR) 10 MG tablet TAKE 1 TABLET BY MOUTH EVERYDAY AT BEDTIME 90 tablet 1   Cholecalciferol (VITAMIN D3) 1.25 MG (50000 UT) CAPS TAKE 1 CAPSULE BY MOUTH ONE TIME PER WEEK 12 capsule 3   citalopram  (CELEXA ) 20 MG tablet Take 1 tablet (20 mg total) by mouth daily. 90 tablet 3   levothyroxine  (SYNTHROID ) 100 MCG tablet Take 1 tablet (100 mcg total) by mouth daily before breakfast. Mon- Sat 100 mcg daily, and Sun 150 mcg daily     Multiple Vitamins-Minerals (PRESERVISION AREDS PO) Take 1 capsule by mouth in the morning and at bedtime.     omeprazole  (PRILOSEC) 20 MG capsule TAKE 1 CAPSULE BY  MOUTH EVERY DAY 90 capsule 3   oxybutynin  (DITROPAN -XL) 10 MG 24 hr tablet TAKE 1 TABLET BY MOUTH EVERYDAY AT BEDTIME 90 tablet 3   Polyethyl Glycol-Propyl Glycol (SYSTANE OP) Place 1 drop into both eyes daily as needed (DRY EYES).     predniSONE (DELTASONE) 20 MG tablet  Take 1 tablet (20 mg total) by mouth daily with breakfast. 5 tablet 0   traMADol  (ULTRAM ) 50 MG tablet Take 1 tablet (50 mg total) by mouth every 8 (eight) hours as needed. 60 tablet 0   No current facility-administered medications for this visit.   No results found.  Review of Systems:   A ROS was performed including pertinent positives and negatives as documented in the HPI.  Physical Exam :   Constitutional: NAD and appears stated age Neurological: Alert and oriented Psych: Appropriate affect and cooperative There were no vitals taken for this visit.   Comprehensive Musculoskeletal Exam:    Right knee with tenderness at the medial tibiofemoral joint space.  There is no crepitus with range of motion range of motion is from 0 to 125 degrees distal neurosensory exam is intact   Imaging:   Xray (3 views right knee): Essentially normal with a small medial tibial osteophyte    I personally reviewed and interpreted the radiographs.   Assessment and Plan:   83 y.o. female with right medial based knee pain which I did describe may be consistent with more of a medial meniscal injury.  Overall I do believe that she is conservatively tried physical therapy as well as multiple injections steroid to hyaluronic acid and nerve ablation.  Given this I do believe an MRI is needed to rule out any type of underlying meniscal injury and I will plan to see her back following discuss results  -Plan for MRI right knee and follow-up discuss results   I personally saw and evaluated the patient, and participated in the management and treatment plan.  Elspeth Parker, MD Attending Physician, Orthopedic Surgery  This document was  dictated using Dragon voice recognition software. A reasonable attempt at proof reading has been made to minimize errors.

## 2024-02-25 ENCOUNTER — Encounter: Payer: Self-pay | Admitting: Radiology

## 2024-02-26 ENCOUNTER — Ambulatory Visit: Admitting: Orthopaedic Surgery

## 2024-03-04 ENCOUNTER — Inpatient Hospital Stay
Admission: RE | Admit: 2024-03-04 | Discharge: 2024-03-04 | Disposition: A | Source: Ambulatory Visit | Attending: Orthopaedic Surgery

## 2024-03-04 ENCOUNTER — Encounter: Payer: Self-pay | Admitting: Radiology

## 2024-03-04 DIAGNOSIS — M25561 Pain in right knee: Secondary | ICD-10-CM

## 2024-03-05 ENCOUNTER — Encounter (HOSPITAL_BASED_OUTPATIENT_CLINIC_OR_DEPARTMENT_OTHER): Payer: Self-pay | Admitting: Orthopaedic Surgery

## 2024-03-06 ENCOUNTER — Other Ambulatory Visit (HOSPITAL_BASED_OUTPATIENT_CLINIC_OR_DEPARTMENT_OTHER): Payer: Self-pay | Admitting: Orthopaedic Surgery

## 2024-03-06 DIAGNOSIS — M25561 Pain in right knee: Secondary | ICD-10-CM

## 2024-03-09 ENCOUNTER — Encounter: Payer: Self-pay | Admitting: Nurse Practitioner

## 2024-03-09 DIAGNOSIS — M25561 Pain in right knee: Secondary | ICD-10-CM

## 2024-03-10 MED ORDER — TRAMADOL HCL 50 MG PO TABS: 50.0000 mg | ORAL_TABLET | Freq: Three times a day (TID) | ORAL | 0 refills | Status: DC | PRN

## 2024-03-26 ENCOUNTER — Ambulatory Visit (HOSPITAL_BASED_OUTPATIENT_CLINIC_OR_DEPARTMENT_OTHER): Admitting: Orthopaedic Surgery

## 2024-04-02 ENCOUNTER — Ambulatory Visit: Admitting: Orthopaedic Surgery

## 2024-04-02 ENCOUNTER — Encounter: Payer: Self-pay | Admitting: Orthopaedic Surgery

## 2024-04-02 DIAGNOSIS — G8929 Other chronic pain: Secondary | ICD-10-CM

## 2024-04-02 DIAGNOSIS — M1711 Unilateral primary osteoarthritis, right knee: Secondary | ICD-10-CM | POA: Diagnosis not present

## 2024-04-02 DIAGNOSIS — M25561 Pain in right knee: Secondary | ICD-10-CM | POA: Diagnosis not present

## 2024-04-02 MED ORDER — TRAMADOL HCL 50 MG PO TABS: 50.0000 mg | ORAL_TABLET | Freq: Three times a day (TID) | ORAL | 0 refills | Status: DC | PRN

## 2024-04-02 NOTE — Progress Notes (Signed)
 The patient is a 83 year old female that I am seeing for the first time but she is sent to us  with known end-stage arthritis of her right knee and in need of knee replacement surgery.  She was sent by my partner Dr. Genelle.  She does have plain films and an MRI of her knee on the canopy system.  She has had ablations of her knee as well as steroid injections and hyaluronic acid injections.  She does live alone but has good family support.  She does report daily right knee pain that is getting significantly worse and is detrimentally affecting her mobility, her quality of life and her actives daily living.  She is a patient also of Camie Doing nurse practitioner who is her PCP.  She does have appointment with Camie Doing in January for regular visit.  I did review all of her medications and past medical history within epic.  She is not a diabetic and not on blood thinning medications and has no significant cardiac history.  She does follow-up with her providers on a regular basis.  She reports significant right knee pain and does wish to discuss knee replacement surgery.  Examination of her right knee she has varus malalignment that is correctable.  There is significant medial joint line tenderness and patellofemoral tenderness throughout the arc of motion of her knee.  She has a slight flexion contracture as well.  The pain is quite significant and there is a mild effusion.  X-rays of her right knee and the MRI of her right knee shows full-thickness cartilage loss in the medial compartment and patellofemoral joint with bone-on-bone wear of the medial compartment as well as osteophytes in the knee, effusion, subchondral edema and periarticular osteophytes.  We had a long and thorough discussion about knee replacement surgery.  I showed her knee replacement model and discussed the risks and benefits of the surgery and what to expect from an intraoperative and postoperative standpoint.  We will work on getting  this scheduled and we will set up for surgery soon after she sees her primary care provider for clearance as well.  All question and concerns were addressed and answered.

## 2024-04-11 ENCOUNTER — Encounter: Payer: Self-pay | Admitting: Orthopaedic Surgery

## 2024-04-11 NOTE — Telephone Encounter (Signed)
Left voicemail message for patient to call me back to schedule.

## 2024-04-15 NOTE — Telephone Encounter (Signed)
 Spoke with patient, planning surgery on 05/27/24.

## 2024-04-25 ENCOUNTER — Other Ambulatory Visit: Payer: Self-pay | Admitting: Family Medicine

## 2024-04-30 ENCOUNTER — Other Ambulatory Visit: Payer: Self-pay | Admitting: Nurse Practitioner

## 2024-04-30 DIAGNOSIS — Z1231 Encounter for screening mammogram for malignant neoplasm of breast: Secondary | ICD-10-CM

## 2024-05-13 ENCOUNTER — Encounter: Payer: Self-pay | Admitting: Nurse Practitioner

## 2024-05-13 ENCOUNTER — Ambulatory Visit: Payer: Medicare Other | Admitting: Nurse Practitioner

## 2024-05-13 VITALS — BP 130/78 | HR 78 | Ht 61.25 in | Wt 161.6 lb

## 2024-05-13 DIAGNOSIS — E039 Hypothyroidism, unspecified: Secondary | ICD-10-CM | POA: Diagnosis not present

## 2024-05-13 DIAGNOSIS — R0982 Postnasal drip: Secondary | ICD-10-CM | POA: Diagnosis not present

## 2024-05-13 DIAGNOSIS — Z0000 Encounter for general adult medical examination without abnormal findings: Secondary | ICD-10-CM | POA: Diagnosis not present

## 2024-05-13 DIAGNOSIS — M1711 Unilateral primary osteoarthritis, right knee: Secondary | ICD-10-CM

## 2024-05-13 DIAGNOSIS — F339 Major depressive disorder, recurrent, unspecified: Secondary | ICD-10-CM

## 2024-05-13 DIAGNOSIS — R011 Cardiac murmur, unspecified: Secondary | ICD-10-CM | POA: Diagnosis not present

## 2024-05-13 DIAGNOSIS — R7303 Prediabetes: Secondary | ICD-10-CM | POA: Diagnosis not present

## 2024-05-13 DIAGNOSIS — M8580 Other specified disorders of bone density and structure, unspecified site: Secondary | ICD-10-CM

## 2024-05-13 DIAGNOSIS — E559 Vitamin D deficiency, unspecified: Secondary | ICD-10-CM | POA: Diagnosis not present

## 2024-05-13 DIAGNOSIS — E782 Mixed hyperlipidemia: Secondary | ICD-10-CM | POA: Diagnosis not present

## 2024-05-13 DIAGNOSIS — R0609 Other forms of dyspnea: Secondary | ICD-10-CM | POA: Diagnosis not present

## 2024-05-13 MED ORDER — AZELASTINE HCL 0.1 % NA SOLN: 2.0000 | Freq: Two times a day (BID) | NASAL | 12 refills | Status: AC

## 2024-05-13 NOTE — Assessment & Plan Note (Addendum)
 Repeat levels today given history and osteopenia. No alarm symptoms present at this time.  Orders:   CBC with Differential/Platelet   CMP14+EGFR   Hemoglobin A1c   Lipid panel   VITAMIN D  25 Hydroxy (Vit-D Deficiency, Fractures)

## 2024-05-13 NOTE — Patient Instructions (Addendum)
 Try some of the therapeutic techniques we have talked about and see if this helps with some of the feelings you are having.  If we need to increase the celexa , we absolutely can do this.   I sent in azelastine  spray to see if this helps with the throat fullness.   I sent a referral to Dr. Floretta for you with cardiology.

## 2024-05-13 NOTE — Assessment & Plan Note (Addendum)
 Managed with atorvastatin . Due for labs for evaluation. Recommend continuation of current management and lifestyle with low fat diet options.  Orders:   CBC with Differential/Platelet   CMP14+EGFR   Hemoglobin A1c   Lipid panel   DG Bone Density; Future

## 2024-05-13 NOTE — Assessment & Plan Note (Addendum)
 Experiences shortness of breath when climbing stairs, which resolves with rest. No signs of heart failure or other cardiac issues aside from chronic, stable murmur. Discussed the possibility of decreased functional level due to reduced activity related to her knee. No LE edema or crackles present to indicate fluid etiology. Has not  - Recommend increased physical activity as tolerated with slow increase to build stamina.  - Consider cardiology evaluation for dyspnea if this worsens.

## 2024-05-13 NOTE — Assessment & Plan Note (Addendum)

## 2024-05-13 NOTE — Assessment & Plan Note (Addendum)
 Bone density scan ordered to assess current status of osteopenia. - Ordered bone density scan  Orders:   CBC with Differential/Platelet   CMP14+EGFR   Hemoglobin A1c   Lipid panel

## 2024-05-13 NOTE — Assessment & Plan Note (Addendum)
 Experiencing increased anxiety and feelings of being overwhelmed, potentially exacerbated by recent cessation of tramadol  and upcoming surgery. Discussed the role of the amygdala and prefrontal cortex in anxiety and the potential benefit of increasing citalopram  dosage. Consideration of counseling as an option for managing anxiety and depression. - Will consider increasing citalopram  dosage - Will schedule a video visit two weeks post-surgery to assess mood and anxiety levels - Will discuss potential counseling options if anxiety persists  Orders:   CBC with Differential/Platelet   CMP14+EGFR   Hemoglobin A1c   Lipid panel

## 2024-05-13 NOTE — Assessment & Plan Note (Addendum)
 History of elevated blood glucose. Currently managed with diet and activity. Will repeat labs today for ongoing evaluation and management.  Orders:   CBC with Differential/Platelet   CMP14+EGFR   Hemoglobin A1c   Lipid panel

## 2024-05-13 NOTE — Progress Notes (Signed)
 Bone Density / DEXA Ordered/Provided today.  Martha Doing, DNP, AGNP-c [facility] [address] [place], [place] [address] Main Office [phone] VISIT TYPE: CPE on 05/13/2024 Today's Vitals   05/13/24 0944  BP: 130/78  Pulse: 78  Weight: 161 lb 9.6 oz (73.3 kg)  Height: 5' 1.25 (1.556 m)   Body mass index is 30.29 kg/m.  Wt Readings from Last 3 Encounters:  05/21/24 161 lb 14.4 oz (73.4 kg)  05/13/24 161 lb 9.6 oz (73.3 kg)  02/08/24 161 lb 12.8 oz (73.4 kg)     Subjective:  Annual Exam (CPE, pt. Has AWV scheduled in July, )  History of Present Illness Martha Gilmore is an 84 year old female who presents for her CPE.   She is scheduled for knee replacement surgery on February 3rd. She is anxious about the surgery and rehabilitation, particularly the post-operative exercises. Her niece, who has undergone similar surgery, will assist her during recovery.  She has a history of allergy to aspirin, which caused a rash in childhood, and avoids aspirin and similar medications like ibuprofen and diclofenac  due to potential reactions. Gabapentin  previously caused disorientation, leading to its discontinuation.  She has been experiencing memory concerns and increased anxiety, particularly related to managing her insurance and losing her wallet. She feels overwhelmed and isolated, partly due to her husband's past illness and her recent move. Her daughter and niece provide support, but she worries about burdening them.  She stopped taking tramadol  abruptly around Christmas, which may have contributed to her current anxiety and mood fluctuations. She is currently on citalopram  for mood management.  She experiences a sensation of something stuck in her throat, which she attributes to reflux. She takes omeprazole  daily for reflux management. There is also a possibility of postnasal drip contributing to this sensation.  She reports shortness of breath when  climbing stairs, which resolves with rest.  Pertinent items are noted in HPI.     05/13/2024    9:40 AM 02/07/2024   10:19 AM 10/30/2023    9:33 AM 10/24/2023    9:17 AM 05/07/2023    9:39 AM  Depression screen PHQ 2/9  Decreased Interest 0 0 0 0 0  Down, Depressed, Hopeless 0 0 0 0 0  PHQ - 2 Score 0 0 0 0 0  Altered sleeping   0    Tired, decreased energy   0    Change in appetite   0    Feeling bad or failure about yourself    0    Trouble concentrating   0    Moving slowly or fidgety/restless   0    Suicidal thoughts   0    PHQ-9 Score   0     Difficult Gilmore work/chores   Not difficult at all       Data saved with a previous flowsheet row definition       05/13/2024    9:41 AM 07/12/2022    3:14 PM 06/28/2022    2:35 PM  GAD 7 : Generalized Anxiety Score  Nervous, Anxious, on Edge 1 0  0   Control/stop worrying 0 0  0   Worry too much - different things 1 0  0   Trouble relaxing 0 0  0   Restless 0 0  0   Easily annoyed or irritable 0 0  0   Afraid - awful might happen 0 0  0   Total GAD 7 Score 2 0 0  Anxiety Difficulty Not difficult at all Not difficult at all Not difficult at all     Data saved with a previous flowsheet row definition       05/13/2024    9:40 AM 02/07/2024   10:18 AM 12/19/2023    9:51 AM 11/22/2023    8:56 AM 11/12/2023   11:02 AM  Fall Risk   Falls in the past year? 0 1 1 0 0  Number falls in past yr: 0 0 0  0  Injury with Fall? 0 0  0   0   Risk for fall due to : No Fall Risks    No Fall Risks  Follow up Falls evaluation completed    Falls evaluation completed     Data saved with a previous flowsheet row definition   Past medical history, surgical history, medications, allergies, family history and social history reviewed with patient today and changes made to appropriate areas of the chart.      Objective:    Physical Exam Vitals and nursing note reviewed.  Constitutional:      General: She is not in acute distress.    Appearance:  Normal appearance.  HENT:     Head: Normocephalic and atraumatic.     Right Ear: Hearing, tympanic membrane, ear canal and external ear normal.     Left Ear: Hearing, tympanic membrane, ear canal and external ear normal.     Nose: Nose normal.     Right Sinus: No maxillary sinus tenderness or frontal sinus tenderness.     Left Sinus: No maxillary sinus tenderness or frontal sinus tenderness.     Mouth/Throat:     Lips: Pink.     Mouth: Mucous membranes are moist.     Pharynx: Oropharynx is clear.     Comments: Posterior drainage noted in oropharynx with clear coloration.  Eyes:     General: Lids are normal. Vision grossly intact.     Extraocular Movements: Extraocular movements intact.     Conjunctiva/sclera: Conjunctivae normal.     Pupils: Pupils are equal, round, and reactive to light.     Funduscopic exam:    Right eye: Red reflex present.        Left eye: Red reflex present.    Visual Fields: Right eye visual fields normal and left eye visual fields normal.  Neck:     Thyroid : No thyromegaly.     Vascular: No carotid bruit.  Cardiovascular:     Rate and Rhythm: Normal rate and regular rhythm.     Chest Wall: PMI is not displaced.     Pulses: Normal pulses.          Dorsalis pedis pulses are 2+ on the right side and 2+ on the left side.       Posterior tibial pulses are 2+ on the right side and 2+ on the left side.     Heart sounds: Murmur heard.  Pulmonary:     Effort: Pulmonary effort is normal. No respiratory distress.     Breath sounds: Normal breath sounds.  Abdominal:     General: Abdomen is flat. Bowel sounds are normal. There is no distension.     Palpations: Abdomen is soft. There is no hepatomegaly, splenomegaly or mass.     Tenderness: There is no abdominal tenderness. There is no right CVA tenderness, left CVA tenderness, guarding or rebound.  Musculoskeletal:        General: Swelling and tenderness present.     Cervical  back: Full passive range of motion  without pain, normal range of motion and neck supple. No tenderness.     Right lower leg: No edema.     Left lower leg: No edema.     Comments: Right knee tenderness and limited ROM along with swelling noted. Surgery scheduled.   Feet:     Left foot:     Toenail Condition: Left toenails are normal.  Lymphadenopathy:     Cervical: No cervical adenopathy.     Upper Body:     Right upper body: No supraclavicular adenopathy.     Left upper body: No supraclavicular adenopathy.  Skin:    General: Skin is warm and dry.     Capillary Refill: Capillary refill takes less than 2 seconds.     Nails: There is no clubbing.  Neurological:     General: No focal deficit present.     Mental Status: She is alert and oriented to person, place, and time.     GCS: GCS eye subscore is 4. GCS verbal subscore is 5. GCS motor subscore is 6.     Sensory: Sensation is intact.     Motor: Motor function is intact.     Coordination: Coordination is intact.     Gait: Gait is intact.     Deep Tendon Reflexes: Reflexes are normal and symmetric.  Psychiatric:        Attention and Perception: Attention normal.        Mood and Affect: Mood normal.        Speech: Speech normal.        Behavior: Behavior normal. Behavior is cooperative.        Thought Content: Thought content normal.        Cognition and Memory: Cognition and memory normal.        Judgment: Judgment normal.         Assessment & Plan:   Assessment & Plan Encounter for annual physical exam CPE completed today. Review of HM activities and recommendations discussed and provided on AVS. Anticipatory guidance, diet, and exercise recommendations provided. Medications, allergies, and hx reviewed and updated as necessary. Orders placed as listed below.  Plan: - Labs ordered. Will make changes as necessary based on results.  - I will review these results and send recommendations via MyChart or a telephone call.  - F/U with CPE in 1 year or sooner for  acute/chronic health needs as directed.      Post-nasal drip Reports sensation of something stuck in throat, possibly due to postnasal drip. Currently using omeprazole  for reflux, which is well-controlled. Clear drainage observed, suggesting postnasal drip as a potential cause. - Prescribed azelastine  nasal spray, 1-2 sprays in each nostril in the morning and before bed - Monitor symptoms for at least 10 days to assess improvement Orders:   azelastine  (ASTELIN ) 0.1 % nasal spray; Place 2 sprays into both nostrils 2 (two) times daily. Use in each nostril as directed  Prediabetes History of elevated blood glucose. Currently managed with diet and activity. Will repeat labs today for ongoing evaluation and management.  Orders:   CBC with Differential/Platelet   CMP14+EGFR   Hemoglobin A1c   Lipid panel  Acquired hypothyroidism Managed with levothyroxine  daily. Currently needing labs for monitoring. No alarm symptoms present. Thyroid  exam normal.  Orders:   CBC with Differential/Platelet   CMP14+EGFR   Hemoglobin A1c   Lipid panel   TSH   T4, free  Vitamin D  deficiency Repeat levels today  given history and osteopenia. No alarm symptoms present at this time.  Orders:   CBC with Differential/Platelet   CMP14+EGFR   Hemoglobin A1c   Lipid panel   VITAMIN D  25 Hydroxy (Vit-D Deficiency, Fractures)  Major depression, recurrent, chronic Experiencing increased anxiety and feelings of being overwhelmed, potentially exacerbated by recent cessation of tramadol  and upcoming surgery. Discussed the role of the amygdala and prefrontal cortex in anxiety and the potential benefit of increasing citalopram  dosage. Consideration of counseling as an option for managing anxiety and depression. - Will consider increasing citalopram  dosage - Will schedule a video visit two weeks post-surgery to assess mood and anxiety levels - Will discuss potential counseling options if anxiety persists  Orders:    CBC with Differential/Platelet   CMP14+EGFR   Hemoglobin A1c   Lipid panel  Mixed hyperlipidemia Managed with atorvastatin . Due for labs for evaluation. Recommend continuation of current management and lifestyle with low fat diet options.  Orders:   CBC with Differential/Platelet   CMP14+EGFR   Hemoglobin A1c   Lipid panel   DG Bone Density; Future  Osteopenia, unspecified location Bone density scan ordered to assess current status of osteopenia. - Ordered bone density scan  Orders:   CBC with Differential/Platelet   CMP14+EGFR   Hemoglobin A1c   Lipid panel  Murmur, cardiac Chronic. No acute concerns at this time.  Orders:   CBC with Differential/Platelet   CMP14+EGFR   Hemoglobin A1c   Lipid panel  Dyspnea on exertion Experiences shortness of breath when climbing stairs, which resolves with rest. No signs of heart failure or other cardiac issues aside from chronic, stable murmur. Discussed the possibility of decreased functional level due to reduced activity related to her knee. No LE edema or crackles present to indicate fluid etiology. Has not  - Recommend increased physical activity as tolerated with slow increase to build stamina.  - Consider cardiology evaluation for dyspnea if this worsens.     Unilateral primary osteoarthritis, right knee Scheduled for right total knee arthroplasty on February 3rd. Concerns about anesthesia and potential postoperative dementia were addressed, with reassurance provided regarding the low risk of dementia post-surgery. Emphasis on the importance of postoperative rehabilitation and exercises for recovery. Discussed the potential for improved outcomes with a positive mindset and preparation for postoperative care. - Proceed with right total knee arthroplasty on February 3rd - Prepare for postoperative rehabilitation and exercises - Consider preoperative preparation for postoperative care, including arranging ice packs and accessible  items    NEXT PREVENTATIVE PHYSICAL DUE IN 1 YEAR.    PATIENT COUNSELING PROVIDED FOR ALL ADULT PATIENTS: A well balanced diet low in saturated fats, cholesterol, and moderation in carbohydrates.  This can be as simple as monitoring portion sizes and cutting back on sugary beverages such as soda and juice to start with.    Daily water consumption of at least 64 ounces.  Physical activity at least 180 minutes per week.  If just starting out, start 10 minutes a day and work your way up.   This can be as simple as taking the stairs instead of the elevator and walking 2-3 laps around the office  purposefully every day.   STD protection, partner selection, and regular testing if high risk.  Limited consumption of alcoholic beverages if alcohol is consumed. For men, I recommend no more than 14 alcoholic beverages per week, spread out throughout the week (max 2 per day). Avoid binge drinking or consuming large quantities of alcohol in one setting.  Please let me know if you feel you may need help with reduction or quitting alcohol consumption.   Avoidance of nicotine, if used. Please let me know if you feel you may need help with reduction or quitting nicotine use.   Daily mental health attention. This can be in the form of 5 minute daily meditation, prayer, journaling, yoga, reflection, etc.  Purposeful attention to your emotions and mental state can significantly improve your overall wellbeing  and Health.  Please know that I am here to help you with all of your health care goals and am happy to work with you to find a solution that works best for you.  The greatest advice I have received with any changes in life are to take it one step at a time, that even means if all you can focus on is the next 60 seconds, then do that and celebrate your victories.  With any changes in life, you will have set backs, and that is OK. The important thing to remember is, if you have a set back, it is  not a failure, it is an opportunity to try again! Screening Testing Mammogram Every 1 -2 years based on history and risk factors Starting at age 715 Pap Smear Ages 21-39 every 3 years Ages 42-65 every 5 years with HPV testing More frequent testing may be required based on results and history Colon Cancer Screening Every 1-10 years based on test performed, risk factors, and history Starting at age 72 Bone Density Screening Every 2-10 years based on history Starting at age 40 for women Recommendations for men differ based on medication usage, history, and risk factors AAA Screening One time ultrasound Men 71-68 years old who have every smoked Lung Cancer Screening Low Dose Lung CT every 12 months Age 75-80 years with a 30 pack-year smoking history who still smoke or who have quit within the last 15 years   Screening Labs Routine  Labs: Complete Blood Count (CBC), Complete Metabolic Panel (CMP), Cholesterol (Lipid Panel) Every 6-12 months based on history and medications May be recommended more frequently based on current conditions or previous results Hemoglobin A1c Lab Every 3-12 months based on history and previous results Starting at age 83 or earlier with diagnosis of diabetes, high cholesterol, BMI >26, and/or risk factors Frequent monitoring for patients with diabetes to ensure blood sugar control Thyroid  Panel (TSH) Every 6 months based on history, symptoms, and risk factors May be repeated more often if on medication HIV One time testing for all patients 51 and older May be repeated more frequently for patients with increased risk factors or exposure Hepatitis C One time testing for all patients 35 and older May be repeated more frequently for patients with increased risk factors or exposure Gonorrhea, Chlamydia Every 12 months for all sexually active persons 13-24 years Additional monitoring may be recommended for those who are considered high risk or who have  symptoms Every 12 months for any woman on birth control, regardless of sexual activity PSA Men 45-6 years old with risk factors Additional screening may be recommended from age 12-69 based on risk factors, symptoms, and history  Vaccine Recommendations Tetanus Booster All adults every 10 years Flu Vaccine All patients 6 months and older every year COVID Vaccine All patients 12 years and older Initial dosing with booster May recommend additional booster based on age and health history HPV Vaccine 2 doses all patients age 71-26 Dosing may be considered for patients over 26 Shingles Vaccine (Shingrix)  2 doses all adults 55 years and older Pneumonia (Pneumovax 23) All adults 65 years and older May recommend earlier dosing based on health history One year apart from Prevnar 13 Pneumonia (Prevnar 43) All adults 65 years and older Dosed 1 year after Pneumovax 23 Pneumonia (Prevnar 20) One time alternative to the two dosing of 13 and 23 For all adults with initial dose of 23, 20 is recommended 1 year later For all adults with initial dose of 13, 23 is still recommended as second option 1 year later     Time: 48 minutes, >50% spent counseling, care coordination, chart review, and documentation.

## 2024-05-13 NOTE — Assessment & Plan Note (Addendum)
 Managed with levothyroxine  daily. Currently needing labs for monitoring. No alarm symptoms present. Thyroid  exam normal.  Orders:   CBC with Differential/Platelet   CMP14+EGFR   Hemoglobin A1c   Lipid panel   TSH   T4, free

## 2024-05-14 ENCOUNTER — Ambulatory Visit: Payer: Self-pay | Admitting: Nurse Practitioner

## 2024-05-14 LAB — VITAMIN D 25 HYDROXY (VIT D DEFICIENCY, FRACTURES): Vit D, 25-Hydroxy: 73.3 ng/mL (ref 30.0–100.0)

## 2024-05-14 LAB — LIPID PANEL
Chol/HDL Ratio: 2.3 ratio (ref 0.0–4.4)
Cholesterol, Total: 202 mg/dL — ABNORMAL HIGH (ref 100–199)
HDL: 86 mg/dL
LDL Chol Calc (NIH): 105 mg/dL — ABNORMAL HIGH (ref 0–99)
Triglycerides: 59 mg/dL (ref 0–149)
VLDL Cholesterol Cal: 11 mg/dL (ref 5–40)

## 2024-05-14 LAB — CBC WITH DIFFERENTIAL/PLATELET
Basophils Absolute: 0.1 x10E3/uL (ref 0.0–0.2)
Basos: 1 %
EOS (ABSOLUTE): 0.1 x10E3/uL (ref 0.0–0.4)
Eos: 2 %
Hematocrit: 41.5 % (ref 34.0–46.6)
Hemoglobin: 13.2 g/dL (ref 11.1–15.9)
Immature Grans (Abs): 0 x10E3/uL (ref 0.0–0.1)
Immature Granulocytes: 0 %
Lymphocytes Absolute: 1.6 x10E3/uL (ref 0.7–3.1)
Lymphs: 25 %
MCH: 29.5 pg (ref 26.6–33.0)
MCHC: 31.8 g/dL (ref 31.5–35.7)
MCV: 93 fL (ref 79–97)
Monocytes Absolute: 0.5 x10E3/uL (ref 0.1–0.9)
Monocytes: 8 %
Neutrophils Absolute: 3.9 x10E3/uL (ref 1.4–7.0)
Neutrophils: 64 %
Platelets: 265 x10E3/uL (ref 150–450)
RBC: 4.47 x10E6/uL (ref 3.77–5.28)
RDW: 13.2 % (ref 11.7–15.4)
WBC: 6.2 x10E3/uL (ref 3.4–10.8)

## 2024-05-14 LAB — TSH: TSH: 2.91 u[IU]/mL (ref 0.450–4.500)

## 2024-05-14 LAB — CMP14+EGFR
ALT: 16 IU/L (ref 0–32)
AST: 24 IU/L (ref 0–40)
Albumin: 4.2 g/dL (ref 3.7–4.7)
Alkaline Phosphatase: 105 IU/L (ref 48–129)
BUN/Creatinine Ratio: 25 (ref 12–28)
BUN: 22 mg/dL (ref 8–27)
Bilirubin Total: 0.5 mg/dL (ref 0.0–1.2)
CO2: 25 mmol/L (ref 20–29)
Calcium: 9.9 mg/dL (ref 8.7–10.3)
Chloride: 104 mmol/L (ref 96–106)
Creatinine, Ser: 0.89 mg/dL (ref 0.57–1.00)
Globulin, Total: 2 g/dL (ref 1.5–4.5)
Glucose: 84 mg/dL (ref 70–99)
Potassium: 4.7 mmol/L (ref 3.5–5.2)
Sodium: 142 mmol/L (ref 134–144)
Total Protein: 6.2 g/dL (ref 6.0–8.5)
eGFR: 65 mL/min/1.73

## 2024-05-14 LAB — HEMOGLOBIN A1C
Est. average glucose Bld gHb Est-mCnc: 108 mg/dL
Hgb A1c MFr Bld: 5.4 % (ref 4.8–5.6)

## 2024-05-14 LAB — T4, FREE: Free T4: 1.4 ng/dL (ref 0.82–1.77)

## 2024-05-15 ENCOUNTER — Other Ambulatory Visit: Payer: Self-pay | Admitting: Physician Assistant

## 2024-05-15 DIAGNOSIS — Z01818 Encounter for other preprocedural examination: Secondary | ICD-10-CM

## 2024-05-20 NOTE — Pre-Procedure Instructions (Signed)
 Surgical Instructions   Your procedure is scheduled on May 27, 2024. Report to [facility] Main Entrance A at 9:15 A.M., then check in with the Admitting office. Any questions or running late day of surgery: call [phone]  Questions prior to your surgery date: call [phone], Monday-Friday, 8am-4pm. If you experience any cold or flu symptoms such as cough, fever, chills, shortness of breath, etc. between now and your scheduled surgery, please notify us  at the above number.     Remember:  Do not eat after midnight the night before your surgery  You may drink clear liquids until 8:15am the morning of your surgery.   Clear liquids allowed are: Water, Non-Citrus Juices (without pulp), Carbonated Beverages, Clear Tea (no milk, honey, etc.), Black Coffee Only (NO MILK, CREAM OR POWDERED CREAMER of any kind), and Gatorade.  Patient Instructions  The night before surgery:  No food after midnight. ONLY clear liquids after midnight   The day of surgery (if you do NOT have diabetes):  Drink ONE (1) Pre-Surgery Clear Ensure by 8:15am the morning of surgery. Drink in one sitting. Do not sip.  This drink was given to you during your hospital  pre-op appointment visit.  Nothing else to drink after completing the  Pre-Surgery Clear Ensure.         If you have questions, please contact your surgeons office.     Take these medicines the morning of surgery with A SIP OF WATER : Azelastine  (Astelin ) nasal spray Citalopram  (Celexa ) Levothyroxine  (Synthroid ) Omeprazole  (Prilosec)   May take these medicines IF NEEDED: Polyethyl Glycol-Propyl Glycol (Systane OP) eye drops Acetaminophen  (Tylenol  Arthritis Pain)  One week prior to surgery, STOP taking any Aspirin (unless otherwise instructed by your surgeon) Aleve, Naproxen, Ibuprofen, Motrin, Advil, Goody's, BC's, all herbal medications, fish oil, and non-prescription vitamins.                     Do NOT Smoke (Tobacco/Vaping)  for 24 hours prior to your procedure.  If you use a CPAP at night, you may bring your mask/headgear for your overnight stay.   You will be asked to remove any contacts, glasses, piercing's, hearing aid's, dentures/partials prior to surgery. Please bring cases for these items if needed.    Your surgeon will determine if you are to be admitted or discharged the same day.  Patients discharged the day of surgery will not be allowed to drive home, and someone needs to stay with them for 24 hours.  SURGICAL WAITING ROOM VISITATION Patients may have no more than 2 support people in the waiting area - these visitors may rotate.   Pre-op nurse will coordinate an appropriate time for 2 ADULT support persons, who may not rotate, to accompany patient in pre-op.  Children under the age of 51 must have an adult with them who is not the patient and must remain in the main waiting area with an adult.  If the patient needs to stay at the hospital during part of their recovery, the visitor guidelines for inpatient rooms apply.  Please refer to the [facility] website for the visitor guidelines for any additional information.   If you received a COVID test during your pre-op visit  it is requested that you wear a mask when out in public, stay away from anyone that may not be feeling well and notify your surgeon if you develop symptoms. If you have been in contact with anyone that has tested positive in the last  10 days please notify you surgeon.      Pre-operative 4 CHG Bathing Instructions   You can play a key role in reducing the risk of infection after surgery. Your skin needs to be as free of germs as possible. You can reduce the number of germs on your skin by washing with CHG (chlorhexidine gluconate) soap before surgery. CHG is an antiseptic soap that kills germs and continues to kill germs even after washing.   DO NOT use if you have an allergy to chlorhexidine/CHG or antibacterial soaps. If your  skin becomes reddened or irritated, stop using the CHG and notify one of our RNs at [phone].   Please shower with the CHG soap starting 4 days before surgery using the following schedule:     Please keep in mind the following:  DO NOT shave, including legs and underarms, starting the day of your first shower.   You may shave your face at any point before/day of surgery.  Place clean sheets on your bed the day you start using CHG soap. Use a clean washcloth (not used since being washed) for each shower. DO NOT sleep with pets once you start using the CHG.   CHG Shower Instructions:  Wash your face and private area with normal soap. If you choose to wash your hair, wash first with your normal shampoo.  After you use shampoo/soap, rinse your hair and body thoroughly to remove shampoo/soap residue.  Turn the water OFF and apply  bottle of CHG soap to a CLEAN washcloth.  Apply CHG soap ONLY FROM YOUR NECK DOWN TO YOUR TOES (washing for 3-5 minutes)  DO NOT use CHG soap on face, private areas, open wounds, or sores.  Pay special attention to the area where your surgery is being performed.  If you are having back surgery, having someone wash your back for you may be helpful. Wait 2 minutes after CHG soap is applied, then you may rinse off the CHG soap.  Pat dry with a clean towel  Put on clean clothes/pajamas   If you choose to wear lotion, please use ONLY the CHG-compatible lotions that are listed below.  Additional instructions for the day of surgery:  If you choose, you may shower the morning of surgery with an antibacterial soap.  DO NOT APPLY any lotions, deodorants, cologne, or perfumes.   Do not bring valuables to the hospital. [facility] is not responsible for any belongings/valuables. Do not wear nail polish, gel polish, artificial nails, or any other type of covering on natural nails (fingers and toes) Do not wear jewelry or makeup Put on clean/comfortable clothes.  Please  brush your teeth.  Ask your nurse before applying any prescription medications to the skin.     CHG Compatible Lotions   Aveeno Moisturizing lotion  Cetaphil Moisturizing Cream  Cetaphil Moisturizing Lotion  Clairol Herbal Essence Moisturizing Lotion, Dry Skin  Clairol Herbal Essence Moisturizing Lotion, Extra Dry Skin  Clairol Herbal Essence Moisturizing Lotion, Normal Skin  Curel Age Defying Therapeutic Moisturizing Lotion with Alpha Hydroxy  Curel Extreme Care Body Lotion  Curel Soothing Hands Moisturizing Hand Lotion  Curel Therapeutic Moisturizing Cream, Fragrance-Free  Curel Therapeutic Moisturizing Lotion, Fragrance-Free  Curel Therapeutic Moisturizing Lotion, Original Formula  Eucerin Daily Replenishing Lotion  Eucerin Dry Skin Therapy Plus Alpha Hydroxy Crme  Eucerin Dry Skin Therapy Plus Alpha Hydroxy Lotion  Eucerin Original Crme  Eucerin Original Lotion  Eucerin Plus Crme Eucerin Plus Lotion  Eucerin TriLipid Replenishing Lotion  Keri Anti-Bacterial Hand Lotion  Keri Deep Conditioning Original Lotion Dry Skin Formula Softly Scented  Keri Deep Conditioning Original Lotion, Fragrance Free Sensitive Skin Formula  Keri Lotion Fast Absorbing Fragrance Free Sensitive Skin Formula  Keri Lotion Fast Absorbing Softly Scented Dry Skin Formula  Keri Original Lotion  Keri Skin Renewal Lotion Keri Silky Smooth Lotion  Keri Silky Smooth Sensitive Skin Lotion  Nivea Body Creamy Conditioning Oil  Nivea Body Extra Enriched Lotion  Nivea Body Original Lotion  Nivea Body Sheer Moisturizing Lotion Nivea Crme  Nivea Skin Firming Lotion  NutraDerm 30 Skin Lotion  NutraDerm Skin Lotion  NutraDerm Therapeutic Skin Cream  NutraDerm Therapeutic Skin Lotion  ProShield Protective Hand Cream  Provon moisturizing lotion  Please read over the following fact sheets that you were given.

## 2024-05-21 ENCOUNTER — Encounter (HOSPITAL_COMMUNITY): Payer: Self-pay

## 2024-05-21 ENCOUNTER — Other Ambulatory Visit: Payer: Self-pay

## 2024-05-21 ENCOUNTER — Telehealth (HOSPITAL_BASED_OUTPATIENT_CLINIC_OR_DEPARTMENT_OTHER): Payer: Self-pay

## 2024-05-21 ENCOUNTER — Encounter (HOSPITAL_COMMUNITY)
Admission: RE | Admit: 2024-05-21 | Discharge: 2024-05-21 | Disposition: A | Discharge status: Home / Self Care | Source: Ambulatory Visit | Attending: Orthopaedic Surgery | Admitting: Orthopaedic Surgery

## 2024-05-21 VITALS — BP 155/70 | HR 62 | Temp 97.8°F | Resp 18 | Ht 61.0 in | Wt 161.9 lb

## 2024-05-21 DIAGNOSIS — Z01818 Encounter for other preprocedural examination: Secondary | ICD-10-CM | POA: Insufficient documentation

## 2024-05-21 HISTORY — DX: Nausea with vomiting, unspecified: R11.2

## 2024-05-21 HISTORY — DX: Hypothyroidism, unspecified: E03.9

## 2024-05-21 LAB — COMPREHENSIVE METABOLIC PANEL WITH GFR
ALT: 18 U/L (ref 0–44)
AST: 24 U/L (ref 15–41)
Albumin: 4.2 g/dL (ref 3.5–5.0)
Alkaline Phosphatase: 107 U/L (ref 38–126)
Anion gap: 9 (ref 5–15)
BUN: 21 mg/dL (ref 8–23)
CO2: 28 mmol/L (ref 22–32)
Calcium: 9.4 mg/dL (ref 8.9–10.3)
Chloride: 105 mmol/L (ref 98–111)
Creatinine, Ser: 0.85 mg/dL (ref 0.44–1.00)
GFR, Estimated: >60 mL/min
Glucose, Bld: 91 mg/dL (ref 70–99)
Potassium: 4.5 mmol/L (ref 3.5–5.1)
Sodium: 141 mmol/L (ref 135–145)
Total Bilirubin: 0.5 mg/dL (ref 0.0–1.2)
Total Protein: 6.6 g/dL (ref 6.5–8.1)

## 2024-05-21 LAB — CBC
HCT: 40 % (ref 36.0–46.0)
Hemoglobin: 13.2 g/dL (ref 12.0–15.0)
MCH: 29.9 pg (ref 26.0–34.0)
MCHC: 33 g/dL (ref 30.0–36.0)
MCV: 90.7 fL (ref 80.0–100.0)
Platelets: 258 10*3/uL (ref 150–400)
RBC: 4.41 MIL/uL (ref 3.87–5.11)
RDW: 14 % (ref 11.5–15.5)
WBC: 6.7 10*3/uL (ref 4.0–10.5)
nRBC: 0 % (ref 0.0–0.2)

## 2024-05-21 LAB — TYPE AND SCREEN
ABO/RH(D): B NEG
Antibody Screen: NEGATIVE

## 2024-05-21 LAB — SURGICAL PCR SCREEN
MRSA, PCR: NEGATIVE
Staphylococcus aureus: NEGATIVE

## 2024-05-21 NOTE — Telephone Encounter (Signed)
"  ° °  Pre-operative Risk Assessment    Patient Name: Martha Gilmore  DOB: [birth year] MRN: [id number]   Date of last office visit: 01/01/23 with Dr. Sheena  Date of next office visit: NA  Request for Surgical Clearance    Procedure:  Right Total knee arthroplasty  Date of Surgery:  Clearance 05/27/24                       Surgeon:  Dr. Vernetta  Surgeon's Group or Practice Name:  [facility] Phone number:  [phone] Fax number:  [phone]   Type of Clearance Requested:   - Medical    Type of Anesthesia:  Spinal and block   Additional requests/questions:    SignedAugustin JONETTA Daring   05/21/2024, 3:34 PM   "

## 2024-05-21 NOTE — Assessment & Plan Note (Signed)
 Scheduled for right total knee arthroplasty on February 3rd. Concerns about anesthesia and potential postoperative dementia were addressed, with reassurance provided regarding the low risk of dementia post-surgery. Emphasis on the importance of postoperative rehabilitation and exercises for recovery. Discussed the potential for improved outcomes with a positive mindset and preparation for postoperative care. - Proceed with right total knee arthroplasty on February 3rd - Prepare for postoperative rehabilitation and exercises - Consider preoperative preparation for postoperative care, including arranging ice packs and accessible items

## 2024-05-21 NOTE — Telephone Encounter (Signed)
 I called the surgeon office to update them 1st the pt has not seen cardiology since 2024 and will need an in office appt. At this moment schedules are full.   Sherri, ortho surgery scheduler made aware schedules are full and I am not going to be able to get the pt in before the 05/27/24 surgery date. Sherri tells me that she informed the pt she will send a clearance request to cardiology however surgery may be post poned if cardiac clearance cannot be attained.  I told Sherri that I can probably get her in 06/05/24 @ our [facility] location.   I left message for the pt to call back and schedule an appt in office for preop clearance. I will fax these notes as FYI to surgeon office.

## 2024-05-21 NOTE — Progress Notes (Addendum)
 PCP - Camie Doing, NP Cardiologist - Dr. Dub Tobb (LOV 01/01/23, was supposed to f/u in 9 months and repeat echo but was not done. Pt reports dyspnea on exertion with stairs and showering. PCP has sent a referral to a new cardiologist but appointment has not been made yet. Reached out to [employer], to make aware, Dr. Damian office notified.)  PPM/ICD - denies  Chest x-ray - denies EKG - 05/21/24 Stress Test - 04/26/17 ECHO - 01/18/21 Cardiac Cath - denies  Sleep Study - 05/06/21 CPAP - does not wear, says she has lost weight and no one says she snores anymore  No DM.  Last dose of GLP1 agonist-  denies  Blood Thinner Instructions: denies Aspirin Instructions:denies  ERAS Protcol -clear liquids until 0815 PRE-SURGERY Ensure   COVID TEST- NA   Anesthesia review: yes, cardiology f/u; abnormal EKG.  Patient denies shortness of breath, fever, cough and chest pain at PAT appointment. Denies any respiratory symptoms besides DOE in the last two months.    All instructions explained to the patient, with a verbal understanding of the material. Patient agrees to go over the instructions while at home for a better understanding. The opportunity to ask questions was provided.

## 2024-05-21 NOTE — Telephone Encounter (Signed)
" ° °  Name: Martha Gilmore  DOB: 11-29-1940  MRN: [id number]  Primary Cardiologist: Kardie Tobb, DO  Chart reviewed as part of pre-operative protocol coverage. Patient has not been seen in our office in over 1 year. Because of [name] past medical history and time since last visit, she will require a follow-up in-office visit in order to better assess preoperative cardiovascular risk.  Pre-op covering staff: - Please schedule appointment and call patient to inform them. If patient already had an upcoming appointment within acceptable timeframe, please add pre-op clearance to the appointment notes so provider is aware. - Please contact requesting surgeon's office via preferred method (i.e, phone, fax) to inform them of need for appointment prior to surgery.   Donnah Levert E Brenn Gatton, PA-C  05/21/2024, 3:54 PM   "

## 2024-05-22 DIAGNOSIS — M65342 Trigger finger, left ring finger: Secondary | ICD-10-CM | POA: Diagnosis not present

## 2024-05-22 MED ORDER — LIDOCAINE HCL 1 % IJ SOLN
5.0000 mL | Freq: Once | INTRAMUSCULAR | Status: AC
  Administered 2024-05-22: 5 mL via INTRADERMAL

## 2024-05-22 MED ORDER — BETAMETHASONE SOD PHOS & ACET 6 (3-3) MG/ML IJ SUSP
12.0000 mg | Freq: Once | INTRAMUSCULAR | Status: AC
  Administered 2024-05-22: 12 mg via INTRAMUSCULAR

## 2024-05-22 NOTE — Addendum Note (Signed)
 Addended by: LATTIE CARLO BROCKS on: 05/22/2024 10:32 AM   Modules accepted: Orders

## 2024-05-22 NOTE — Progress Notes (Addendum)
 Anesthesia Chart Review:  Case: [id number] Date/Time: 05/27/24 1100   Procedure: ARTHROPLASTY, KNEE, TOTAL (Right: Knee)   Anesthesia type: Spinal   Diagnosis: Primary osteoarthritis of right knee [M17.11]   Pre-op diagnosis: osteoarthritis right knee   [facility] ROOM [facility] / [facility]   Surgeons: Vernetta Lonni GRADE, MD       DISCUSSION: Patient is an 84 year old female scheduled for the above procedure.  History includes never smoker, postoperative N/V, HLD, murmur (mild-moderate AR, mild MR 12/2020 TTE), atrial flutter/SVT (s/p DCCV 04/13/2021; s/p RF ablation of SVT 10/14/2021), OSA (moderate 02/2021, denied CPAP use after weight loss), for thyroidism, GERD, C. difficile colitis (following clindamycin 09/2016), skin cancer (BCC), osteoarthritis.   She was first established with [facility] in September 2002 for dyspnea with monitor concerning for aflutter. She underwent DCCV on 04/13/2021. She also had a sleep study in 02/2021 and was diagnosed with moderate OSA (denied current use of CPAP following weight loss). She underwent RF ablation of SVT on 10/14/2021. At last EP visit with Dr. Inocencio on 11/15/2022 she was maintaining NSR. Diltizaem and Eliquis  discontinued at that visit with as needed EP follow-up. She continued to follow with primary cardiologist Dr. Sheena for aortic insufficiency and HLD. Last visit 01/01/2023. Fatigue was better, but advised she follow-up with sleep medicine since she had stopped using CPAP. Atrovastatin continued for HLD. Volume status okay with no new SOB. Plan was for 9 months follow-up with updated echo at that time, but this has not happened.   She is for elective right TKA. Reported DOE with activities such as showering and going upstairs. She will need preoperative cardiology evaluation. Sherrie at Dr. Damian office aware. Patient now with cardiology visit scheduled for 05/26/2024 at 1:00 PM.   ADDENDUM 05/26/2024 5:01 PM: She had preoperative cardiology  evaluation by Natalia Birmingham, PA-C on 05/26/2024. Preoperative cardiac risk assessment Patient scheduled to undergo right total knee arthroplasty with Dr. Vernetta 05/27/2024 No active cardiac symptoms (chest pain, dyspnea, syncope, palpitations) No history of recent ACS, decompensated heart failure, significant arrhythmias or severe valvular disease RCRI is 0, indicating perioperative risk of major cardiac event is 0.4% Reviewed recent EKG from 05/21/2024 that showed sinus rhythm with no acute changes Given valvular heart disease history will update echocardiogram, this will be completed today - Echo today (05/26/2024) showed LVEF 55-60%, no RWMA, normal diastolic parameters, normal RV systolic function, normal PASP, severely dilated LA, mild MR, moderate AR, moderate AS (Aortic regurgitation PHT 377 msec, . AVA VTI 0.70 cm, AV mean gradient 22.0 mmHg, AV Vmax 3.03 m/s). Interpreting cardiologist Dr. Emeline Calender noted on personal review there was likely mild aortic stenosis (AV Vmax 2.20  m/s, AVA 1.68 cm, mean grad 11.0  mmHg, calcified valve leaflets).    VS: BP (!) 155/70   Pulse 62   Temp 36.6 C   Resp 18   Ht 5' 1 (1.549 m)   Wt 73.4 kg   SpO2 98%   BMI 30.59 kg/m    PROVIDERS: Early, Camie BRAVO, NP is PCP  Tobb, Kardie, DO is cardiologist Deno Chi, MD is EP cardiologist    LABS: Labs reviewed: Acceptable for surgery. A1c 5.4% 05/13/2024.  (all labs ordered are listed, but only abnormal results are displayed)  Labs Reviewed  SURGICAL PCR SCREEN  CBC  COMPREHENSIVE METABOLIC PANEL WITH GFR  TYPE AND SCREEN    Sleep Study 03/04/2021: IMPRESSIONS - Moderate obstructive sleep apnea occurred during this study (AHI = 20.2/h). - Moderate  oxygen desaturation was noted during this study (Min O2 = 78.0%). - The patient snored with moderate snoring volume. - Atrial fibrillation and PVCs were noted during this study. - Clinically significant periodic limb movements did not  occur during sleep. No significant associated arousals.    IMAGES: MRI Right Knee 03/04/2024: IMPRESSION: - Moderate tricompartmental osteoarthrosis most notably the medial compartment with full-thickness cartilage loss and subchondral reactive edema. There is mild chondromalacia of the medial facet of patella. There is a small reactive joint effusion and Baker's cyst. - Likely degenerative changes in the anterior horn lateral meniscus without a discrete lateral meniscal tear. - Blunting of the posterior horn and body of the medial meniscus with abnormal signal and enlargement of the anterior horn of the medial meniscus. Findings have the appearance of a chronic degenerative tear. No displaced lateral meniscal flap is identified. Correlation for prior partial meniscectomy.   EKG: EKG 05/21/2024: Sinus bradycardia at 59 bpm Cannot rule out Septal infarct , age undetermined Abnormal ECG When compared with ECG of 14-Oct-2021 15:04, No significant change since last tracing Confirmed by Anner Lenis ([id number]) on 05/22/2024 12:00:27 AM    CV: Echo 05/26/2024: IMPRESSIONS   1. Left ventricular ejection fraction, by estimation, is 55 to 60%. Left  ventricular ejection fraction by 3D volume is 59 %. The left ventricle has  normal function. The left ventricle has no regional wall motion  abnormalities. Left ventricular diastolic   parameters were normal.   2. Right ventricular systolic function is normal. The right ventricular  size is normal. There is normal pulmonary artery systolic pressure.   3. Left atrial size was severely dilated.   4. Right atrial size was mildly dilated.   5. The mitral valve is degenerative. Mild mitral valve regurgitation. No  evidence of mitral stenosis.   6. The aortic valve is calcified. Aortic valve regurgitation is moderate.  Moderate aortic valve stenosis. Aortic regurgitation PHT measures 377  msec. Aortic valve area, by VTI measures 0.70 cm. Aortic valve  mean  gradient measures 22.0 mmHg. Aortic  valve Vmax measures 3.03 m/s.   7. The inferior vena cava is dilated in size with >50% respiratory  variability, suggesting right atrial pressure of 8 mmHg.  Comparison(s): A prior study was performed on 01/18/2021. There was mild  to moderate aortic regurgitation without aortic stenosis reported however  on personal review there was likely mild aortic stenosis (AV Vmax 2.20  m/s, AVA 1.68 cm, mean grad 11.0  mmHg, calcified valve leaflets).   - Comparison Echo 01/18/2021: LVEF 60-65%, no RWMA, LVDP indeterminate due to aflutter, normal PASP, RVSP 18.1 mmHg, mildly dilated LA, MV abnormal, mild MR, mean MV gradient 2.0 mmHG with AHR 103 bpm, mild-moderate AR, mild-moderate AV sclerosis without evidence of AS.   EP Procedure 10/14/2021:  1. Comprehensive EP study.   2. Coronary sinus pacing and recording.   3. Mapping of supraventricular tachycardia.   4. Radiofrequency ablation of supraventricular tachycardia.    Long term monitor 11/30/2020 -  12/03/2020: Predominant rhythm was atrial flutter with average heart rate 92bpm and ranged from 48 to 172bpm. No sinus rhythm was present. Occasional PVCs were present.    ETT 04/26/2017: Blood pressure demonstrated a normal response to exercise. There was no ST segment deviation noted during stress. No T wave inversion was noted during stress.   Unable to achieve THR - submaximal stress test which is inconclusive. Consider pharmacologic stress testing or cardiac CT angiography as an alterative ischemia evaluation.  Past Medical History:  Diagnosis Date   Allergy    Aortic insufficiency    mild to moderate by echo 12/2020   Arrhythmia    Arthritis    Atrial flutter (HCC) 12/30/2020   Cancer (HCC)    basal cell   Cataracts, bilateral 09/23/2015   Clostridium difficile infection 04/16/2017   H/o C.diff due to clindamicyin 09/2016; two rounds of treatment for cure. Dr. Rollin   Depression     Encounter for annual physical exam 05/09/2023   GERD (gastroesophageal reflux disease)    Heart murmur    Hyperlipidemia    Hypothyroidism    OAB (overactive bladder) 12/16/2013   Osteopenia 08/05/2012   Dexa scan 07/2012 mild osteopenia, T = -1.1   Other fatigue 09/25/2022   PONV (postoperative nausea and vomiting)    Seasonal allergies    Sleep apnea    Thyroid  disease    Urge and stress incontinence     Past Surgical History:  Procedure Laterality Date   A-FLUTTER ABLATION N/A 10/14/2021   Procedure: A-FLUTTER ABLATION;  Surgeon: Inocencio Soyla Lunger, MD;  Location: [facility] INVASIVE CV LAB;  Service: Cardiovascular;  Laterality: N/A;   ABLATION     to back and knee   APPENDECTOMY  1971   CARDIOVERSION N/A 04/13/2021   Procedure: CARDIOVERSION;  Surgeon: Santo Stanly LABOR, MD;  Location: [facility] ENDOSCOPY;  Service: Cardiovascular;  Laterality: N/A;   CESAREAN SECTION     x2   CHOLECYSTECTOMY     COSMETIC SURGERY     Mohs/end of nose   EYE SURGERY  cataracts   Teeth Implants     TRIGGER FINGER RELEASE     VAGINAL HYSTERECTOMY      MEDICATIONS:  acetaminophen  (TYLENOL  8 HOUR ARTHRITIS PAIN) 650 MG CR tablet   atorvastatin  (LIPITOR) 10 MG tablet   azelastine  (ASTELIN ) 0.1 % nasal spray   Cholecalciferol (VITAMIN D3) 1.25 MG (50000 UT) CAPS   citalopram  (CELEXA ) 20 MG tablet   levothyroxine  (SYNTHROID ) 100 MCG tablet   Multiple Vitamins-Minerals (PRESERVISION AREDS PO)   omeprazole  (PRILOSEC) 20 MG capsule   oxybutynin  (DITROPAN -XL) 10 MG 24 hr tablet   Polyethyl Glycol-Propyl Glycol (SYSTANE OP)   traMADol  (ULTRAM ) 50 MG tablet   No current facility-administered medications for this encounter.    Isaiah Ruder, PA-C Surgical Short Stay/Anesthesiology [facility] Phone [phone] [facility] Phone [phone] 05/22/2024 12:59 PM

## 2024-05-25 NOTE — Progress Notes (Unsigned)
 " Cardiology Office Note:    Date:  05/26/2024   ID:  Martha Gilmore, DOB [birth year], MRN [id number]  PCP:  Oris Camie BRAVO, NP   [facility] Providers Cardiologist:  Dub Huntsman, DO Electrophysiologist:  Soyla Gladis Norton, MD {  Referring MD: Oris Camie BRAVO, NP   Chief Complaint  Patient presents with   Pre-op Exam   History of Present Illness:    Martha Gilmore is a 84 y.o. female with a hx of atrial flutter s/p ablation, mild to moderate AR, mild to moderate aortic sclerosis, mild MR, hyperlipidemia, prediabetes, depression, osteopenia, osteoarthritis, hypothyroidism, overactive bladder, vitamin D  deficiency.  She has been seen in clinic today and request for preoperative cardiac evaluation prior to undergoing right total knee arthroplasty with Dr. Vernetta.  She is a patient of Dr. Ginette, last seen September 2024.  She reported to be doing fairly well at this visit.  Overall she had no complaints.  She was getting get reestablished with our sleep team as she had not been compliant with her CPAP.  She was also followed with Dr. Norton for her atrial flutter.  She was last seen by him in July 2023.  She underwent ablation in June 2023 and remained in sinus rhythm.  At this appointment she had stopped both her diltiazem  and Eliquis  per Dr. Norton.  She was recently seen by her PCP 05/13/2024 for an annual physical.  Upon chart review, she was feeling slightly anxious about her surgery and rehab especially the postop exercises.  She reported she had been experiencing some concerns with her memory, increased anxiety noting that she was feeling overwhelmed and isolated since her husband's past illness and her recent move.  She has some support with her daughter and her niece.  At this appointment: It appears that she mention she was having shortness of breath on climbing the stairs that resolves at rest.  There was no lower extremity swelling or crackles present on exam.  They  discussed the possibility of decreased functional status due to pain related to her knee.  But she presents today to clinic to undergo cardiac evaluation prior to right total knee arthroplasty.  Echo from September 2022: LVEF 60 to 65%, no RWMA, normal RV systolic function, normal PASP, mild LAE, mild to moderate aortic regurgitation, mild to moderate aortic valve sclerosis without stenosis, mild mitral regurgitation, normal IVC.  There have been no updated echocardiogram since this time.  She presents in clinic today for her pre-operative cardiac assessment. She tells me that she is doing well. She has some anxiety regarding her surgery but has good support with her daughter and niece at the moment. She denies any chest pain, shortness of breath, LE edema, orthopnea, palpitations, presyncope, syncope. She is slightly limited given her right knee pain but is still able to complete all of her ADLs without any issues. She is currently ambulating with a cane but is doing fairly well.   Past Medical History:  Diagnosis Date   Allergy    Aortic insufficiency    mild to moderate by echo 12/2020   Arrhythmia    Arthritis    Atrial flutter (HCC) 12/30/2020   Cancer (HCC)    basal cell   Cataracts, bilateral 09/23/2015   Clostridium difficile infection 04/16/2017   H/o C.diff due to clindamicyin 09/2016; two rounds of treatment for cure. Dr. Rollin   Depression    Encounter for annual physical exam 05/09/2023   GERD (gastroesophageal reflux disease)  Heart murmur    Hyperlipidemia    Hypothyroidism    OAB (overactive bladder) 12/16/2013   Osteopenia 08/05/2012   Dexa scan 07/2012 mild osteopenia, T = -1.1   Other fatigue 09/25/2022   PONV (postoperative nausea and vomiting)    Seasonal allergies    Sleep apnea    Thyroid  disease    Urge and stress incontinence    Past Surgical History:  Procedure Laterality Date   A-FLUTTER ABLATION N/A 10/14/2021   Procedure: A-FLUTTER ABLATION;  Surgeon:  Inocencio Soyla Lunger, MD;  Location: [facility];  Service: Cardiovascular;  Laterality: N/A;   ABLATION     to back and knee   APPENDECTOMY  1971   CARDIOVERSION N/A 04/13/2021   Procedure: CARDIOVERSION;  Surgeon: Santo Stanly LABOR, MD;  Location: [facility] ENDOSCOPY;  Service: Cardiovascular;  Laterality: N/A;   CESAREAN SECTION     x2   CHOLECYSTECTOMY     COSMETIC SURGERY     Mohs/end of nose   EYE SURGERY  cataracts   Teeth Implants     TRIGGER FINGER RELEASE     VAGINAL HYSTERECTOMY     Current Medications: Current Outpatient Medications  Medication Instructions   acetaminophen  (TYLENOL  8 HOUR ARTHRITIS PAIN) 650 mg, Every 8 hours PRN   atorvastatin  (LIPITOR) 10 MG tablet TAKE 1 TABLET BY MOUTH EVERYDAY AT BEDTIME   azelastine  (ASTELIN ) 0.1 % nasal spray 2 sprays, Each Nare, 2 times daily, Use in each nostril as directed   Cholecalciferol (VITAMIN D3) 1.25 MG (50000 UT) CAPS TAKE 1 CAPSULE BY MOUTH ONE TIME PER WEEK   citalopram  (CELEXA ) 20 mg, Oral, Daily   levothyroxine  (SYNTHROID ) 100 mcg, Oral, Daily before breakfast, Mon- Sat 100 mcg daily, and Sun 150 mcg daily   Multiple Vitamins-Minerals (PRESERVISION AREDS PO) 1 capsule, 2 times daily   omeprazole  (PRILOSEC) 20 MG capsule Oral, Daily   oxybutynin  (DITROPAN -XL) 10 MG 24 hr tablet TAKE 1 TABLET BY MOUTH EVERYDAY AT BEDTIME   Polyethyl Glycol-Propyl Glycol (SYSTANE OP) 1 drop, Daily PRN   Allergies:   Aspirin, Demerol  [meperidine hcl], Nsaids, Penicillins, Pilocarpine, Statins, Cephalexin, Clindamycin/lincomycin, Diclofenac , Gabapentin , and Tape   Social History   Socioeconomic History   Marital status: Widowed    Spouse name: Not on file   Number of children: Not on file   Years of education: Not on file   Highest education level: Some college, no degree  Occupational History   Not on file  Tobacco Use   Smoking status: Never    Passive exposure: Yes   Smokeless tobacco: Never   Tobacco comments:     grew up in house of smokers  Vaping Use   Vaping status: Never Used  Substance and Sexual Activity   Alcohol use: No   Drug use: No   Sexual activity: Not Currently    Partners: Male    Birth control/protection: None  Other Topics Concern   Not on file  Social History Narrative   Not on file   Social Drivers of Health   Tobacco Use: Medium Risk (05/26/2024)   Patient History    Smoking Tobacco Use: Never    Smokeless Tobacco Use: Never    Passive Exposure: Yes  Financial Resource Strain: Low Risk (02/08/2024)   Overall Financial Resource Strain (CARDIA)    Difficulty of Paying Living Expenses: Not hard at all  Food Insecurity: No Food Insecurity (02/08/2024)   Epic    Worried About Radiation Protection Practitioner of [employer]  in the Last Year: Never true    Ran Out of Food in the Last Year: Never true  Transportation Needs: No Transportation Needs (02/08/2024)   Epic    Lack of Transportation (Medical): No    Lack of Transportation (Non-Medical): No  Physical Activity: Inactive (02/08/2024)   Exercise Vital Sign    Days of Exercise per Week: 0 days    Minutes of Exercise per Session: Not on file  Stress: No Stress Concern Present (02/08/2024)   [employer] of Occupational Health - Occupational Stress Questionnaire    Feeling of Stress: Not at all  Social Connections: Moderately Integrated (02/08/2024)   Social Connection and Isolation Panel    Frequency of Communication with Friends and Family: More than three times a week    Frequency of Social Gatherings with Friends and Family: Twice a week    Attends Religious Services: More than 4 times per year    Active Member of [employer] or Organizations: Yes    Attends Banker Meetings: Not on file    Marital Status: Widowed  Depression (PHQ2-9): Low Risk (05/13/2024)   Depression (PHQ2-9)    PHQ-2 Score: 0  Alcohol Screen: Low Risk (06/21/2022)   Alcohol Screen    Last Alcohol Screening Score (AUDIT): 0  Housing: Low Risk  (02/08/2024)   Epic    Unable to Pay for Housing in the Last Year: No    Number of Times Moved in the Last Year: 0    Homeless in the Last Year: No  Utilities: Not At Risk (10/30/2023)   Epic    Threatened with loss of utilities: No  Health Literacy: Adequate Health Literacy (10/30/2023)   B1300 Health Literacy    Frequency of need for help with medical instructions: Never    Family History: The patient's family history includes Arthritis in her mother and sister; Brain cancer in her brother; Cancer in her daughter, father, sister, and sister; Diabetes in her sister; Early death in her sister; Heart disease in her father, maternal grandmother, mother, and sister; Hypertension in her mother and sister; Kidney cancer in her sister; Kidney disease in her sister; Lung cancer in her sister; Miscarriages / Stillbirths in her mother; Obesity in her father, mother, and sister.  ROS:   Please see the history of present illness.    All other systems reviewed and are negative.  EKGs/Labs/Other Studies Reviewed:    The following studies were reviewed today: EKG from 1/28/206 showed sinus rhythm, no acute changes       Recent Labs: 05/13/2024: TSH 2.910 05/21/2024: ALT 18; BUN 21; Creatinine, Ser 0.85; Hemoglobin 13.2; Platelets 258; Potassium 4.5; Sodium 141  Recent Lipid Panel    Component Value Date/Time   CHOL 202 (H) 05/13/2024 1107   TRIG 59 05/13/2024 1107   HDL 86 05/13/2024 1107   CHOLHDL 2.3 05/13/2024 1107   CHOLHDL 3 05/03/2022 0908   VLDL 12.4 05/03/2022 0908   LDLCALC 105 (H) 05/13/2024 1107   Risk Assessment/Calculations:     HYPERTENSION CONTROL Vitals:   05/26/24 1313 05/26/24 1328  BP: (!) 150/66 (!) 136/92    The patient's blood pressure is elevated above target today.  In order to address the patient's elevated BP: Blood pressure will be monitored at home to determine if medication changes need to be made.         Physical Exam:    VS:  BP (!) 136/92   Pulse  61   Ht 5' 1 (1.549  m)   Wt 164 lb (74.4 kg)   SpO2 99%   BMI 30.99 kg/m     Wt Readings from Last 3 Encounters:  05/26/24 164 lb (74.4 kg)  05/21/24 161 lb 14.4 oz (73.4 kg)  05/13/24 161 lb 9.6 oz (73.3 kg)   GEN:  Well nourished, well developed in no acute distress HEENT: Normal NECK: No JVD; No carotid bruits LYMPHATICS: No lymphadenopathy CARDIAC: RRR, + murmur RESPIRATORY:  Clear to auscultation without rales, wheezing or rhonchi  ABDOMEN: Soft, non-tender, non-distended MUSCULOSKELETAL:  No edema; No deformity  SKIN: Warm and dry NEUROLOGIC:  Alert and oriented x 3 PSYCHIATRIC:  Normal affect   ASSESSMENT:    1. Preop cardiovascular exam   2. Mild aortic regurgitation   3. Mild aortic valve sclerosis   4. Mild mitral regurgitation   5. Typical atrial flutter (HCC)   6. S/P ablation of atrial flutter   7. Mixed hyperlipidemia   8. Elevated blood pressure reading    PLAN:    Preoperative cardiac risk assessment Patient scheduled to undergo right total knee arthroplasty with Dr. Vernetta 05/27/2024 No active cardiac symptoms (chest pain, dyspnea, syncope, palpitations) No history of recent ACS, decompensated heart failure, significant arrhythmias or severe valvular disease RCRI is 0, indicating perioperative risk of major cardiac event is 0.4% Reviewed recent EKG from 05/21/2024 that showed sinus rhythm with no acute changes Given valvular heart disease history will update echocardiogram, this will be completed today  I will route this recommendation to the requesting party via Epic fax function  Mild to moderate aortic regurgitation Mild to moderate aortic sclerosis Mild mitral regurgitation Noted on echo from 12/2020 No updated echo since then She denies any chest pain, shortness of breath, LE edema, orthopnea, palpitations, presyncope, syncope Will update echocardiogram, this can be done today   Atrial flutter s/p ablation 2023 Underwent atrial flutter  ablation June 2023 No recurrence of atrial flutter Discontinued diltiazem  and Eliquis  July 2023 She denies any palpitations  EKG reviewed from 05/21/2024 showed sinus rhythm, no acute changes   Hyperlipidemia Home meds: Lipitor 10 mg daily 05/13/2024: HDL 86; LDL Chol Calc (NIH) 105 05/21/2024: ALT 18  Managed by PCP  Elevated BP  Initial SBP 150, 136 on recheck She tells me that she typically SBP 120-130s She notes that recently it has been elevated She believes this is in the setting of stress, anxiety and pain She is not currently on any antihypertensive therapy She will follow up with PCP once recovered from surgery to ensure there is no further treatment required   Follow-up: as needed per patient request, or sooner if needed pending echo results         Medication Adjustments/Labs and Tests Ordered: Current medicines are reviewed at length with the patient today.  Concerns regarding medicines are outlined above.  Orders Placed This Encounter  Procedures   ECHOCARDIOGRAM COMPLETE   No orders of the defined types were placed in this encounter.   Patient Instructions  Medication Instructions:  Your physician recommends that you continue on your current medications as directed. Please refer to the Current Medication list given to you today.  *If you need a refill on your cardiac medications before your next appointment, please call your pharmacy*  Lab Work: NONE If you have labs (blood work) drawn today and your tests are completely normal, you will receive your results only by: MyChart Message (if you have MyChart) OR A paper copy in the mail If you have  any lab test that is abnormal or we need to change your treatment, we will call you to review the results.  Testing/Procedures: (TODAY at 2:30 pm) Your physician has requested that you have an echocardiogram. Echocardiography is a painless test that uses sound waves to create images of your heart. It provides your doctor  with information about the size and shape of your heart and how well your hearts chambers and valves are working. This procedure takes approximately one hour. There are no restrictions for this procedure. Please do NOT wear cologne, perfume, aftershave, or lotions (deodorant is allowed). Please arrive 15 minutes prior to your appointment time.  Please note: We ask at that you not bring children with you during ultrasound (echo/ vascular) testing. Due to room size and safety concerns, children are not allowed in the ultrasound rooms during exams. Our front office staff cannot provide observation of children in our lobby area while testing is being conducted. An adult accompanying a patient to their appointment will only be allowed in the ultrasound room at the discretion of the ultrasound technician under special circumstances. We apologize for any inconvenience.   Follow-Up: At [facility], you and your health needs are our priority.  As part of our continuing mission to provide you with exceptional heart care, our providers are all part of one team.  This team includes your primary Cardiologist (physician) and Advanced Practice Providers or APPs (Physician Assistants and Nurse Practitioners) who all work together to provide you with the care you need, when you need it.  Your next appointment:   As Needed              Signed, Waddell DELENA Donath, PA-C  05/26/2024 1:55 PM    [facility] "

## 2024-05-26 ENCOUNTER — Encounter: Payer: Self-pay | Admitting: Cardiology

## 2024-05-26 ENCOUNTER — Ambulatory Visit: Admitting: Cardiology

## 2024-05-26 ENCOUNTER — Ambulatory Visit
Admission: RE | Admit: 2024-05-26 | Discharge: 2024-05-26 | Disposition: A | Source: Ambulatory Visit | Attending: Cardiology

## 2024-05-26 VITALS — BP 136/92 | HR 61 | Ht 61.0 in | Wt 164.0 lb

## 2024-05-26 DIAGNOSIS — I358 Other nonrheumatic aortic valve disorders: Secondary | ICD-10-CM | POA: Diagnosis not present

## 2024-05-26 DIAGNOSIS — R030 Elevated blood-pressure reading, without diagnosis of hypertension: Secondary | ICD-10-CM

## 2024-05-26 DIAGNOSIS — I340 Nonrheumatic mitral (valve) insufficiency: Secondary | ICD-10-CM | POA: Diagnosis not present

## 2024-05-26 DIAGNOSIS — Z01810 Encounter for preprocedural cardiovascular examination: Secondary | ICD-10-CM | POA: Diagnosis not present

## 2024-05-26 DIAGNOSIS — Z8679 Personal history of other diseases of the circulatory system: Secondary | ICD-10-CM

## 2024-05-26 DIAGNOSIS — E782 Mixed hyperlipidemia: Secondary | ICD-10-CM

## 2024-05-26 DIAGNOSIS — I351 Nonrheumatic aortic (valve) insufficiency: Secondary | ICD-10-CM

## 2024-05-26 DIAGNOSIS — I483 Typical atrial flutter: Secondary | ICD-10-CM | POA: Diagnosis not present

## 2024-05-26 DIAGNOSIS — Z9889 Other specified postprocedural states: Secondary | ICD-10-CM

## 2024-05-26 LAB — ECHOCARDIOGRAM COMPLETE
AR max vel: 0.74 cm2
AV Area VTI: 0.7 cm2
AV Area mean vel: 0.68 cm2
AV Mean grad: 22 mmHg
AV Peak grad: 36.7 mmHg
Ao pk vel: 3.03 m/s
Area-P 1/2: 4.68 cm2
Height: 61 in
MV M vel: 5.01 m/s
MV Peak grad: 100.4 mmHg
P 1/2 time: 377 ms
S' Lateral: 2.9 cm
Weight: 2624 [oz_av]

## 2024-05-26 NOTE — Patient Instructions (Signed)
 Medication Instructions:  Your physician recommends that you continue on your current medications as directed. Please refer to the Current Medication list given to you today.  *If you need a refill on your cardiac medications before your next appointment, please call your pharmacy*  Lab Work: NONE If you have labs (blood work) drawn today and your tests are completely normal, you will receive your results only by: MyChart Message (if you have MyChart) OR A paper copy in the mail If you have any lab test that is abnormal or we need to change your treatment, we will call you to review the results.  Testing/Procedures: (TODAY at 2:30 pm) Your physician has requested that you have an echocardiogram. Echocardiography is a painless test that uses sound waves to create images of your heart. It provides your doctor with information about the size and shape of your heart and how well your hearts chambers and valves are working. This procedure takes approximately one hour. There are no restrictions for this procedure. Please do NOT wear cologne, perfume, aftershave, or lotions (deodorant is allowed). Please arrive 15 minutes prior to your appointment time.  Please note: We ask at that you not bring children with you during ultrasound (echo/ vascular) testing. Due to room size and safety concerns, children are not allowed in the ultrasound rooms during exams. Our front office staff cannot provide observation of children in our lobby area while testing is being conducted. An adult accompanying a patient to their appointment will only be allowed in the ultrasound room at the discretion of the ultrasound technician under special circumstances. We apologize for any inconvenience.   Follow-Up: At [facility], you and your health needs are our priority.  As part of our continuing mission to provide you with exceptional heart care, our providers are all part of one team.  This team includes your primary  Cardiologist (physician) and Advanced Practice Providers or APPs (Physician Assistants and Nurse Practitioners) who all work together to provide you with the care you need, when you need it.  Your next appointment:   As Needed

## 2024-05-26 NOTE — Anesthesia Preprocedure Evaluation (Signed)
"                                    Anesthesia Evaluation    Reviewed: Allergy & Precautions, Patient's Chart, lab work & pertinent test results  History of Anesthesia Complications (+) PONV and history of anesthetic complications  Airway        Dental   Pulmonary sleep apnea           Cardiovascular hypertension, + dysrhythmias Atrial Fibrillation + Valvular Problems/Murmurs AI and AS    '26 TTE - EF 55 to 60%. Left atrial size was severely dilated. Right atrial size was mildly dilated. Mild mitral valve regurgitation. Aortic valve regurgitation is moderate. Moderate aortic valve stenosis. Aortic regurgitation PHT measures 377 msec. Aortic valve area, by VTI measures 0.70 cm. Aortic valve mean gradient measures 22.0 mmHg. Aortic valve Vmax measures 3.03 m/s.     Neuro/Psych  PSYCHIATRIC DISORDERS  Depression    negative neurological ROS     GI/Hepatic Neg liver ROS,GERD  Medicated,,  Endo/Other  Hypothyroidism   Obesity   Renal/GU negative Renal ROS  Female GU complaint     Musculoskeletal  (+) Arthritis ,    Abdominal   Peds  Hematology negative hematology ROS (+)   Anesthesia Other Findings   Reproductive/Obstetrics                              Anesthesia Physical Anesthesia Plan  ASA: 3  Anesthesia Plan: General   Post-op Pain Management: Tylenol  PO (pre-op)* and Regional block*   Induction: Intravenous  PONV Risk Score and Plan: 4 or greater and Treatment may vary due to age or medical condition and TIVA  Airway Management Planned: Oral ETT  Additional Equipment: ClearSight  Intra-op Plan:   Post-operative Plan: Extubation in OR  Informed Consent:   Plan Discussed with: CRNA and Anesthesiologist  Anesthesia Plan Comments: (PAT note written 05/26/2024 by Sharlon Pfohl, PA-C.)         Anesthesia Quick Evaluation  "

## 2024-05-27 ENCOUNTER — Encounter (HOSPITAL_COMMUNITY): Payer: Self-pay | Admitting: Certified Registered"

## 2024-05-27 ENCOUNTER — Encounter (HOSPITAL_COMMUNITY): Payer: Self-pay | Admitting: Vascular Surgery

## 2024-05-27 ENCOUNTER — Ambulatory Visit (HOSPITAL_COMMUNITY): Admission: RE | Admit: 2024-05-27 | Source: Home / Self Care | Admitting: Orthopaedic Surgery

## 2024-05-27 ENCOUNTER — Ambulatory Visit: Payer: Self-pay | Admitting: Cardiology

## 2024-05-27 ENCOUNTER — Encounter (HOSPITAL_COMMUNITY): Admission: RE | Payer: Self-pay | Source: Home / Self Care

## 2024-05-27 DIAGNOSIS — M1711 Unilateral primary osteoarthritis, right knee: Secondary | ICD-10-CM | POA: Diagnosis present

## 2024-05-27 MED ORDER — ONDANSETRON HCL 4 MG/2ML IJ SOLN
INTRAMUSCULAR | Status: AC
  Filled 2024-05-27: qty 2

## 2024-05-27 MED ORDER — FENTANYL CITRATE (PF) 100 MCG/2ML IJ SOLN
INTRAMUSCULAR | Status: AC
  Filled 2024-05-27: qty 2

## 2024-05-27 MED ORDER — LIDOCAINE 2% (20 MG/ML) 5 ML SYRINGE
INTRAMUSCULAR | Status: AC
  Filled 2024-05-27: qty 5

## 2024-05-27 MED ORDER — DEXAMETHASONE SOD PHOSPHATE PF 10 MG/ML IJ SOLN
INTRAMUSCULAR | Status: AC
  Filled 2024-05-27: qty 1

## 2024-05-27 MED ORDER — ROCURONIUM BROMIDE 10 MG/ML (PF) SYRINGE
PREFILLED_SYRINGE | INTRAVENOUS | Status: AC
  Filled 2024-05-27: qty 10

## 2024-05-27 MED ORDER — PROPOFOL 10 MG/ML IV BOLUS
INTRAVENOUS | Status: AC
  Filled 2024-05-27: qty 20

## 2024-05-27 MED ORDER — PHENYLEPHRINE 80 MCG/ML (10ML) SYRINGE FOR IV PUSH (FOR BLOOD PRESSURE SUPPORT)
PREFILLED_SYRINGE | INTRAVENOUS | Status: AC
  Filled 2024-05-27: qty 10

## 2024-05-27 NOTE — Progress Notes (Signed)
 Fentanyl  100 mcg/2 ml vended from [facility] for procedure, then patient cancelled for today. Unable to locate and waste in [facility].   Wasted in Immunologist in OR Pharmacy. Witness Osborn Daring, CRNA.  Genaro Zebedee Calin, RPh 05/27/2024 11:02 AM

## 2024-06-09 ENCOUNTER — Encounter: Admitting: Orthopaedic Surgery

## 2024-06-12 ENCOUNTER — Ambulatory Visit: Admitting: Nurse Practitioner

## 2024-06-13 ENCOUNTER — Ambulatory Visit (HOSPITAL_COMMUNITY): Admit: 2024-06-13 | Admitting: Orthopaedic Surgery

## 2024-06-26 ENCOUNTER — Encounter: Admitting: Orthopaedic Surgery

## 2024-06-27 ENCOUNTER — Ambulatory Visit

## 2024-07-15 ENCOUNTER — Ambulatory Visit: Admitting: Nurse Practitioner

## 2024-07-28 ENCOUNTER — Ambulatory Visit

## 2024-11-11 ENCOUNTER — Ambulatory Visit: Payer: Self-pay
